# Patient Record
Sex: Female | Born: 1970 | Race: White | Hispanic: No | Smoking: Never smoker
Health system: Southern US, Community
[De-identification: ages and names within clinical notes are randomized; demographics above are authoritative.]

## PROBLEM LIST (undated history)

## (undated) DIAGNOSIS — R42 Dizziness and giddiness: Secondary | ICD-10-CM

## (undated) DIAGNOSIS — C801 Malignant (primary) neoplasm, unspecified: Secondary | ICD-10-CM

## (undated) DIAGNOSIS — Z9889 Other specified postprocedural states: Secondary | ICD-10-CM

## (undated) DIAGNOSIS — R000 Tachycardia, unspecified: Secondary | ICD-10-CM

## (undated) DIAGNOSIS — Z800 Family history of malignant neoplasm of digestive organs: Secondary | ICD-10-CM

## (undated) DIAGNOSIS — I10 Essential (primary) hypertension: Secondary | ICD-10-CM

## (undated) DIAGNOSIS — C189 Malignant neoplasm of colon, unspecified: Secondary | ICD-10-CM

## (undated) DIAGNOSIS — E669 Obesity, unspecified: Secondary | ICD-10-CM

## (undated) DIAGNOSIS — F419 Anxiety disorder, unspecified: Secondary | ICD-10-CM

## (undated) DIAGNOSIS — T8859XA Other complications of anesthesia, initial encounter: Secondary | ICD-10-CM

## (undated) DIAGNOSIS — Z803 Family history of malignant neoplasm of breast: Secondary | ICD-10-CM

## (undated) DIAGNOSIS — F32A Depression, unspecified: Secondary | ICD-10-CM

## (undated) DIAGNOSIS — T4145XA Adverse effect of unspecified anesthetic, initial encounter: Secondary | ICD-10-CM

## (undated) DIAGNOSIS — E785 Hyperlipidemia, unspecified: Secondary | ICD-10-CM

## (undated) DIAGNOSIS — F329 Major depressive disorder, single episode, unspecified: Secondary | ICD-10-CM

## (undated) DIAGNOSIS — R112 Nausea with vomiting, unspecified: Secondary | ICD-10-CM

## (undated) DIAGNOSIS — E039 Hypothyroidism, unspecified: Secondary | ICD-10-CM

## (undated) HISTORY — DX: Tachycardia, unspecified: R00.0

## (undated) HISTORY — DX: Malignant neoplasm of colon, unspecified: C18.9

## (undated) HISTORY — DX: Obesity, unspecified: E66.9

## (undated) HISTORY — DX: Family history of malignant neoplasm of digestive organs: Z80.0

## (undated) HISTORY — DX: Hypothyroidism, unspecified: E03.9

## (undated) HISTORY — DX: Anxiety disorder, unspecified: F41.9

## (undated) HISTORY — DX: Malignant (primary) neoplasm, unspecified: C80.1

## (undated) HISTORY — DX: Family history of malignant neoplasm of breast: Z80.3

## (undated) HISTORY — DX: Dizziness and giddiness: R42

## (undated) HISTORY — DX: Essential (primary) hypertension: I10

## (undated) HISTORY — DX: Depression, unspecified: F32.A

## (undated) HISTORY — PX: WISDOM TOOTH EXTRACTION: SHX21

## (undated) HISTORY — DX: Major depressive disorder, single episode, unspecified: F32.9

## (undated) HISTORY — DX: Hyperlipidemia, unspecified: E78.5

---

## 1998-04-03 ENCOUNTER — Emergency Department (HOSPITAL_COMMUNITY): Admission: EM | Admit: 1998-04-03 | Discharge: 1998-04-03 | Payer: Self-pay | Admitting: Emergency Medicine

## 1998-04-03 ENCOUNTER — Encounter: Payer: Self-pay | Admitting: Emergency Medicine

## 1999-11-01 ENCOUNTER — Other Ambulatory Visit: Admission: RE | Admit: 1999-11-01 | Discharge: 1999-11-01 | Payer: Self-pay | Admitting: Gynecology

## 2005-01-02 ENCOUNTER — Emergency Department: Payer: Self-pay | Admitting: Emergency Medicine

## 2005-01-03 ENCOUNTER — Ambulatory Visit: Payer: Self-pay | Admitting: Internal Medicine

## 2005-06-03 ENCOUNTER — Ambulatory Visit: Payer: Self-pay | Admitting: Obstetrics and Gynecology

## 2005-07-26 ENCOUNTER — Inpatient Hospital Stay: Payer: Self-pay

## 2010-08-05 ENCOUNTER — Encounter: Payer: Self-pay | Admitting: Nurse Practitioner

## 2010-08-05 ENCOUNTER — Telehealth: Payer: Self-pay | Admitting: *Deleted

## 2010-08-05 DIAGNOSIS — E669 Obesity, unspecified: Secondary | ICD-10-CM | POA: Insufficient documentation

## 2010-08-05 DIAGNOSIS — R000 Tachycardia, unspecified: Secondary | ICD-10-CM | POA: Insufficient documentation

## 2010-08-05 DIAGNOSIS — R079 Chest pain, unspecified: Secondary | ICD-10-CM | POA: Insufficient documentation

## 2010-08-05 DIAGNOSIS — R42 Dizziness and giddiness: Secondary | ICD-10-CM | POA: Insufficient documentation

## 2010-08-05 NOTE — Telephone Encounter (Signed)
Pt called, c/o chest pressure and tightness since last night.  Pt admits to being under a lot of stress and is tearful.  No chest pain, pressure, or tightness now.  Pt would like to be seen by someone.  Norma Fredrickson NP notified and instructed RN to have pt come in today for office pt.  Pt notified of Lori's instructions and is unable to come in today.  RN set pt up to see Lawson Fiscal on 08/06/10 at 845 am.  Pt was advised to go to ER if symptoms return.   Pt verbalized to RN understanding of instructions.

## 2010-08-06 ENCOUNTER — Encounter: Payer: Self-pay | Admitting: Nurse Practitioner

## 2010-08-06 ENCOUNTER — Ambulatory Visit (INDEPENDENT_AMBULATORY_CARE_PROVIDER_SITE_OTHER): Payer: BC Managed Care – PPO | Admitting: Nurse Practitioner

## 2010-08-06 DIAGNOSIS — R079 Chest pain, unspecified: Secondary | ICD-10-CM

## 2010-08-06 DIAGNOSIS — E785 Hyperlipidemia, unspecified: Secondary | ICD-10-CM

## 2010-08-06 LAB — HEPATIC FUNCTION PANEL
ALT: 22 U/L (ref 0–35)
AST: 21 U/L (ref 0–37)
Albumin: 3.7 g/dL (ref 3.5–5.2)
Alkaline Phosphatase: 95 U/L (ref 39–117)
Bilirubin, Direct: 0 mg/dL (ref 0.0–0.3)
Total Bilirubin: 0.6 mg/dL (ref 0.3–1.2)
Total Protein: 7 g/dL (ref 6.0–8.3)

## 2010-08-06 LAB — BASIC METABOLIC PANEL
BUN: 12 mg/dL (ref 6–23)
CO2: 26 mEq/L (ref 19–32)
Calcium: 8.2 mg/dL — ABNORMAL LOW (ref 8.4–10.5)
Chloride: 104 mEq/L (ref 96–112)
Creatinine, Ser: 0.7 mg/dL (ref 0.4–1.2)
GFR: 98.02 mL/min (ref >60.00)
Glucose, Bld: 120 mg/dL — ABNORMAL HIGH (ref 70–99)
Potassium: 4.2 mEq/L (ref 3.5–5.1)
Sodium: 141 mEq/L (ref 135–145)

## 2010-08-06 LAB — LIPID PANEL
Cholesterol: 240 mg/dL — ABNORMAL HIGH (ref 0–200)
HDL: 56.1 mg/dL (ref >39.00)
Total CHOL/HDL Ratio: 4
Triglycerides: 295 mg/dL — ABNORMAL HIGH (ref 0.0–149.0)
VLDL: 59 mg/dL — ABNORMAL HIGH (ref 0.0–40.0)

## 2010-08-06 LAB — LDL CHOLESTEROL, DIRECT: Direct LDL: 140.5 mg/dL

## 2010-08-06 LAB — TSH: TSH: 0.91 u[IU]/mL (ref 0.35–5.50)

## 2010-08-06 MED ORDER — ASPIRIN EC 81 MG PO TBEC: 81.0000 mg | DELAYED_RELEASE_TABLET | Freq: Every day | ORAL | Status: AC

## 2010-08-06 NOTE — Progress Notes (Signed)
    Beverly Schwartz Date of Birth: 23-Dec-1970   History of Present Illness: Beverly Schwartz is seen today for a work in visit. She is seen for Dr. Deborah Chalk. She had an episode of chest pressure this past Sunday night. It was midsternal in location. She feels like it radiated to her neck. She was not doing anything exertional. The discomfort lasted about 5 minutes. She was not sweaty or clammy. She did feel like it was hard to breath and that lasted for about 30 minutes altogether. She took some aspirin.  She had some recurrence yesterday and noted that it hurt to take a deep breath. She has not had a recent cough or cold. No fever or chills. She has not been here since 2010. She did not take the cholesterol medicine. She is now diabetic but is controlling it with diet. She has lost about 20 pounds since her last visit here.   Current Outpatient Prescriptions on File Prior to Visit  Medication Sig Dispense Refill  . levothyroxine (SYNTHROID, LEVOTHROID) 125 MCG tablet Take 125 mcg by mouth daily.          No Known Allergies  Past Medical History  Diagnosis Date  . Tachycardia   . Chest pain   . Dizziness   . Obesity   . Diabetes mellitus     Past Surgical History  Procedure Date  . Wisdom tooth extraction     age 51's    History  Smoking status  . Never Smoker   Smokeless tobacco  . Not on file    History  Alcohol Use No    Family History  Problem Relation Age of Onset  . Heart disease Father   . Heart disease Paternal Uncle     Review of Systems: The review of systems is positive for anxiety. She is prone to panic attacks. She is actively losing weight. Blood sugars are normalizing.  All other systems were reviewed and are negative.  Physical Exam: BP 138/88  Pulse 88  Ht 5\' 4"  (1.626 m)  Wt 194 lb (87.998 kg)  BMI 33.30 kg/m2 Patient is pleasant and in no acute distress. She is somewhat tearful during the exam. Skin is warm and dry. Color is normal.  HEENT is unremarkable.  Normocephalic/atraumatic. PERRL. Sclera are nonicteric. Neck is supple. No masses. No JVD. Lungs are clear. Cardiac exam shows a regular rate and rhythm. Abdomen is soft. Extremities are without edema. Gait and ROM are intact. No gross neurologic deficits noted.  LABORATORY DATA:  EKG is normal.    Assessment / Plan:

## 2010-08-06 NOTE — Assessment & Plan Note (Signed)
Fasting lipds are checked today. I have encouraged her to start lipid lowering if indicated.

## 2010-08-06 NOTE — Patient Instructions (Signed)
We will check some labs today. We are going to arrange for a stress test. Take a baby aspirin each day. We are going to check a CXR today as well.

## 2010-08-06 NOTE — Assessment & Plan Note (Signed)
She has multiple cardiovascular risk factors (weight, strong family history, dyslipidemia and now she is diabetic). We will arrange for a CXR today and a stress echo. Labs are checked today. Further disposition to follow once her studies are complete. She will continue with aspirin therapy. Patient is agreeable to this plan and will call if any problems develop in the interim. '

## 2010-08-07 ENCOUNTER — Telehealth: Payer: Self-pay | Admitting: *Deleted

## 2010-08-07 NOTE — Telephone Encounter (Signed)
Message copied by Barnetta Hammersmith on Wed Aug 07, 2010  8:26 AM ------      Message from: Norma Fredrickson      Created: Wed Aug 07, 2010  7:57 AM       Would strongly suggest cholesterol medicines. Would she try Crestor 10 mg per day. Recheck labs in 6 weeks.

## 2010-08-09 NOTE — Telephone Encounter (Signed)
Pt notified of lab results and was instructed to start Crestor 10 mg daily.  Pt will come by next week for samples of Crestor 10 mg and will pick up a copy of her labs at that time.  Pt told to f/u with PCP in six weeks for a recheck of labs to include liver functions and lipid panel.  Pt verbalized to RN understanding of instructions.

## 2010-08-14 ENCOUNTER — Other Ambulatory Visit (HOSPITAL_COMMUNITY): Payer: Self-pay

## 2010-08-16 ENCOUNTER — Ambulatory Visit (HOSPITAL_COMMUNITY): Payer: BC Managed Care – PPO | Attending: Cardiology | Admitting: Radiology

## 2010-08-16 DIAGNOSIS — R079 Chest pain, unspecified: Secondary | ICD-10-CM | POA: Insufficient documentation

## 2010-08-16 DIAGNOSIS — R072 Precordial pain: Secondary | ICD-10-CM

## 2010-08-23 ENCOUNTER — Other Ambulatory Visit: Payer: Self-pay | Admitting: *Deleted

## 2010-08-23 ENCOUNTER — Telehealth: Payer: Self-pay | Admitting: *Deleted

## 2010-08-23 ENCOUNTER — Telehealth: Payer: Self-pay | Admitting: Cardiology

## 2010-08-23 MED ORDER — LEVOTHYROXINE SODIUM 125 MCG PO TABS: 125.0000 ug | ORAL_TABLET | Freq: Every day | ORAL | Status: DC

## 2010-08-23 NOTE — Telephone Encounter (Signed)
Pt wants to know stress test results she had done last Friday and also she would like refill of thyroid meds

## 2010-08-23 NOTE — Telephone Encounter (Signed)
Pt notified of stress echo results.  Pt encourage to start statin therapy and to have excellent glucose control.  Pt will call back if she decides to start cholesterol medication.

## 2010-08-23 NOTE — Telephone Encounter (Signed)
Message copied by Adolphus Birchwood on Fri Aug 23, 2010  3:08 PM ------      Message from: Rosalio Macadamia      Created: Fri Aug 23, 2010  8:21 AM       Stress echo looks good. No ischemia. Need to continue with cardiovascular risk factor modification. Did she start statin therapy?      Needs excellent glucose control.

## 2010-08-23 NOTE — Telephone Encounter (Signed)
Pt notified of stress echo results.  Pt was strongly encouraged to start statin therapy and to have excellent glucose control.  Pt will call back if she decides to start statin therapy.

## 2010-09-03 ENCOUNTER — Telehealth: Payer: Self-pay | Admitting: *Deleted

## 2010-09-03 NOTE — Telephone Encounter (Signed)
error 

## 2011-12-15 ENCOUNTER — Other Ambulatory Visit: Payer: Self-pay | Admitting: Obstetrics and Gynecology

## 2011-12-15 DIAGNOSIS — Z1231 Encounter for screening mammogram for malignant neoplasm of breast: Secondary | ICD-10-CM

## 2012-01-13 ENCOUNTER — Ambulatory Visit
Admission: RE | Admit: 2012-01-13 | Discharge: 2012-01-13 | Disposition: A | Discharge status: Home / Self Care | Payer: BC Managed Care – PPO | Source: Ambulatory Visit | Attending: Obstetrics and Gynecology | Admitting: Obstetrics and Gynecology

## 2012-01-13 DIAGNOSIS — Z1231 Encounter for screening mammogram for malignant neoplasm of breast: Secondary | ICD-10-CM

## 2013-02-28 ENCOUNTER — Encounter (HOSPITAL_COMMUNITY): Payer: Self-pay | Admitting: Emergency Medicine

## 2013-02-28 ENCOUNTER — Emergency Department (HOSPITAL_COMMUNITY)
Admission: EM | Admit: 2013-02-28 | Discharge: 2013-02-28 | Disposition: A | Discharge status: Home / Self Care | Payer: BC Managed Care – PPO | Attending: Emergency Medicine | Admitting: Emergency Medicine

## 2013-02-28 DIAGNOSIS — Z8669 Personal history of other diseases of the nervous system and sense organs: Secondary | ICD-10-CM | POA: Insufficient documentation

## 2013-02-28 DIAGNOSIS — Z885 Allergy status to narcotic agent status: Secondary | ICD-10-CM | POA: Insufficient documentation

## 2013-02-28 DIAGNOSIS — X500XXA Overexertion from strenuous movement or load, initial encounter: Secondary | ICD-10-CM | POA: Insufficient documentation

## 2013-02-28 DIAGNOSIS — M545 Low back pain, unspecified: Secondary | ICD-10-CM

## 2013-02-28 DIAGNOSIS — S335XXA Sprain of ligaments of lumbar spine, initial encounter: Secondary | ICD-10-CM | POA: Insufficient documentation

## 2013-02-28 DIAGNOSIS — Z8679 Personal history of other diseases of the circulatory system: Secondary | ICD-10-CM | POA: Insufficient documentation

## 2013-02-28 DIAGNOSIS — Z79899 Other long term (current) drug therapy: Secondary | ICD-10-CM | POA: Insufficient documentation

## 2013-02-28 DIAGNOSIS — Z881 Allergy status to other antibiotic agents status: Secondary | ICD-10-CM | POA: Insufficient documentation

## 2013-02-28 DIAGNOSIS — T148XXA Other injury of unspecified body region, initial encounter: Secondary | ICD-10-CM

## 2013-02-28 DIAGNOSIS — E669 Obesity, unspecified: Secondary | ICD-10-CM | POA: Insufficient documentation

## 2013-02-28 DIAGNOSIS — Y929 Unspecified place or not applicable: Secondary | ICD-10-CM | POA: Insufficient documentation

## 2013-02-28 DIAGNOSIS — M62838 Other muscle spasm: Secondary | ICD-10-CM | POA: Insufficient documentation

## 2013-02-28 DIAGNOSIS — Y9375 Activity, martial arts: Secondary | ICD-10-CM | POA: Insufficient documentation

## 2013-02-28 DIAGNOSIS — E119 Type 2 diabetes mellitus without complications: Secondary | ICD-10-CM | POA: Insufficient documentation

## 2013-02-28 MED ORDER — NAPROXEN 500 MG PO TABS: 500.0000 mg | ORAL_TABLET | Freq: Two times a day (BID) | ORAL | Status: DC

## 2013-02-28 MED ORDER — METHOCARBAMOL 500 MG PO TABS: 500.0000 mg | ORAL_TABLET | Freq: Two times a day (BID) | ORAL | Status: DC

## 2013-02-28 NOTE — ED Provider Notes (Signed)
CSN: [id number]     Arrival date & time 02/28/13  1016 History   First MD Initiated Contact with Patient 02/28/13 1043    This chart was scribed for Beverly Helper PA-C, a non-physician practitioner working with Beverly Gales, MD by Beverly Schwartz, ED Scribe. This patient was seen in room TR09C/TR09C and the patient's care was started at 11:40 AM     Chief Complaint  Patient presents with  . Back Pain   (Consider location/radiation/quality/duration/timing/severity/associated sxs/prior Treatment) The history is provided by the patient. No language interpreter was used.   HPI Comments: Beverly Schwartz is a 42 y.o. female who presents to the Emergency Department complaining of constant moderate left sided low back pain onset 3 weeks. Describes pain as tight, spasms, and without radiation. Reports recent "yellow belt demonstration" in martial arts class with low back pain starting following day. States she tried to push opponent off, but he laid on her left leg. Reports pain is exacerbated by flexion. Reports trying ibuprofen with moderate relief of symptoms. Denies associated other known injury, fall, fever, dysuria, hematuria, and numbness. Denies urinary or fecal incontinence, urinary retention, perineal/saddle paresthesias, fever, PMHx of cancer, and IV drug use.  Past Medical History  Diagnosis Date  . Tachycardia   . Chest pain   . Dizziness   . Obesity   . Diabetes mellitus    Past Surgical History  Procedure Laterality Date  . Wisdom tooth extraction      age 79's   Family History  Problem Relation Age of Onset  . Heart disease Father   . Heart disease Paternal Uncle    History  Substance Use Topics  . Smoking status: Never Smoker   . Smokeless tobacco: Not on file  . Alcohol Use: No   OB History   Grav Para Term Preterm Abortions TAB SAB Ect Mult Living                 Review of Systems  Constitutional: Negative for fever.  Musculoskeletal: Positive for back pain.   Skin: Negative for wound.  Neurological: Negative for numbness.    Allergies  Vicodin and Amoxicillin  Home Medications   Current Outpatient Rx  Name  Route  Sig  Dispense  Refill  . levothyroxine (SYNTHROID, LEVOTHROID) 125 MCG tablet   Oral   Take 1 tablet (125 mcg total) by mouth daily.   90 tablet   2    BP 173/96  Pulse 105  Temp(Src) 98.9 F (37.2 C) (Oral)  Resp 18  SpO2 99% Physical Exam  Nursing note and vitals reviewed. Constitutional: She is oriented to person, place, and time. She appears well-developed and well-nourished. No distress.  HENT:  Head: Normocephalic and atraumatic.  Eyes: Conjunctivae and EOM are normal.  Neck: Neck supple. No tracheal deviation present.  Cardiovascular: Normal rate and regular rhythm.   No murmur heard. Pulmonary/Chest: Effort normal and breath sounds normal. No respiratory distress.  Musculoskeletal: Normal range of motion. She exhibits tenderness. She exhibits no edema.  No midline C-spine, T-spine, or L-spine tenderness with no step-offs, crepitus, or deformities noted   TTP of left paraspinal lumbar region. No obvious sign changes noted.      Neurological: She is alert and oriented to person, place, and time.  5/5 strength in bilateral lower extremities. Ankle plantar and dorsiflexion intact. Great toe extension intact bilaterally. +2 DP and PT pulses. +2 patellar reflexes bilaterally. Normal gait.   Skin: Skin is warm and dry.  Psychiatric:  She has a normal mood and affect. Her behavior is normal.    ED Course  Procedures  COORDINATION OF CARE:  Nursing notes reviewed. Vital signs reviewed. Initial pt interview and examination performed.   11:45 AM-Pt informed of return precautions and is comfortable with discharge at this time.    Treatment plan initiated:Medications - No data to display   Initial diagnostic testing ordered.    Labs Review Labs Reviewed - No data to display Imaging Review No results  found.  EKG Interpretation   None       MDM   1. Muscle strain   2. Low back pain    BP 173/96  Pulse 105  Temp(Src) 98.9 F (37.2 C) (Oral)  Resp 18  SpO2 99%   I personally performed the services described in this documentation, which was scribed in my presence. The recorded information has been reviewed and is accurate.      Beverly Helper, PA-C 02/28/13 1211

## 2013-02-28 NOTE — ED Notes (Signed)
C/o lower back pain onset 2-3 weeks ago. States pain isn't any different today just isn't getting better. Describes as tight and crampy feeling. No history of injury.

## 2013-03-02 NOTE — ED Provider Notes (Signed)
Medical screening examination/treatment/procedure(s) were performed by non-physician practitioner and as supervising physician I was immediately available for consultation/collaboration.  EKG Interpretation   None         Darlys Gales, MD 03/02/13 [phone]

## 2013-03-30 ENCOUNTER — Encounter (HOSPITAL_COMMUNITY): Payer: Self-pay | Admitting: Emergency Medicine

## 2013-03-30 ENCOUNTER — Emergency Department (HOSPITAL_COMMUNITY): Payer: Self-pay

## 2013-03-30 ENCOUNTER — Emergency Department (HOSPITAL_COMMUNITY)
Admission: EM | Admit: 2013-03-30 | Discharge: 2013-03-30 | Disposition: A | Discharge status: Home / Self Care | Payer: BC Managed Care – PPO | Source: Home / Self Care | Attending: Emergency Medicine | Admitting: Emergency Medicine

## 2013-03-30 DIAGNOSIS — H6122 Impacted cerumen, left ear: Secondary | ICD-10-CM

## 2013-03-30 DIAGNOSIS — M545 Low back pain, unspecified: Secondary | ICD-10-CM

## 2013-03-30 DIAGNOSIS — H6120 Impacted cerumen, unspecified ear: Secondary | ICD-10-CM

## 2013-03-30 DIAGNOSIS — H60322 Hemorrhagic otitis externa, left ear: Secondary | ICD-10-CM

## 2013-03-30 MED ORDER — CEPHALEXIN 500 MG PO CAPS: 500.0000 mg | ORAL_CAPSULE | Freq: Three times a day (TID) | ORAL | Status: DC

## 2013-03-30 MED ORDER — NEOMYCIN-POLYMYXIN-HC 3.5-10000-1 OT SUSP: 3.0000 [drp] | Freq: Four times a day (QID) | OTIC | Status: DC

## 2013-03-30 MED ORDER — MELOXICAM 15 MG PO TABS: 15.0000 mg | ORAL_TABLET | Freq: Every day | ORAL | Status: DC

## 2013-03-30 MED ORDER — TRAMADOL HCL 50 MG PO TABS: 100.0000 mg | ORAL_TABLET | Freq: Three times a day (TID) | ORAL | Status: DC | PRN

## 2013-03-30 MED ORDER — METHOCARBAMOL 500 MG PO TABS: 500.0000 mg | ORAL_TABLET | Freq: Three times a day (TID) | ORAL | Status: DC

## 2013-03-30 NOTE — ED Notes (Signed)
Patient refusing lumbar spine x-ray, thought there was no charge for x-rays. Stated that she would get the x-rays when she goes to her "back" doctor

## 2013-03-30 NOTE — ED Notes (Signed)
Could not take patient for lumbar spine x-rays, patient was having ear wax removed

## 2013-03-30 NOTE — Discharge Instructions (Signed)
Do exercises twice daily followed by moist heat for 15 minutes. ° ° ° ° ° °Try to be as active as possible. ° °If no better in 2 weeks, follow up with orthopedist. ° ° °

## 2013-03-30 NOTE — ED Notes (Signed)
C/o of left ear pain, no drainage, feels stopped up, unable to hear. Lower back pain that was diagnosed as a strain by the hospital a few weeks ago. Stated she has taken muscle relaxer's with no relief. Written by: Lenore Manner, SMA

## 2013-03-30 NOTE — ED Provider Notes (Signed)
Chief Complaint:   Chief Complaint  Patient presents with  . Otalgia  . Back Pain    History of Present Illness:   Beverly Schwartz is a 43 year old female who presents today for back pain and left ear pain.  1. Lower back pain: The patient is a 2 month history of lower back pain. This followed a Ta Kwan Do test in which she had to flip someone over her shoulder. Ever since then she's had pain in the left mid lumbar area which radiates into the left hip. The pain is rated a 9/10 in intensity. It's worse when she bends, gets out of a chair, gets out of her bed, gets in and out of the car. Nothing seems to help the pain. There is no radiation the pain down the legs, numbness, tingling, weakness, bladder or bowel dysfunction, or saddle anesthesia. She went to the emergency room at onset of symptoms and was told she had a back strain. She was given a muscle relaxer, but the pain has continued.  2. Left ear pain: The patient has a one-week history of left ear congestion. She tried to clean it out on her own and the ear ended up getting irritated and painful.  Review of Systems:  Other than noted above, the patient denies any of the following symptoms: Systemic:  No fever, chills, severe fatigue, or unexplained weight loss. GI:  No abdominal pain, nausea, vomiting, diarrhea, constipation, incontinence of bowel, or blood in stool. GU:  No dysuria, frequency, urgency, or hematuria. No incontinence of urine or difficulty urinating.  M-S:  No neck pain, joint pain, arthritis, or myalgias. Neuro:  No paresthesias, saddle anesthesia, muscular weakness, or progressive neurological deficit.  [place]:  Past medical history, family history, social history, meds, and allergies were reviewed. Specifically, there is no history of cancer, major trauma, osteoporosis, immunosuppression, or HIV infection. She is allergic to amoxicillin. She takes Synthroid for low thyroid. She also has diet-controlled type 2 diabetes.  She's followed by Dr. Delrae Rend.  Physical Exam:   Vital signs:  BP 161/93  Pulse 94  Temp(Src) 98.5 F (36.9 C) (Oral)  Resp 18 General:  Alert, oriented, in no distress. ENT: There is a cerumen impaction in the left ear canal. The canal appears red and inflamed. There is a small pustule at the 1:00 position in the distal ear canal. Abdomen:  Soft, non-tender.  No organomegaly or mass.  No pulsatile midline abdominal mass or bruit. Back:  She has mild tenderness to palpation in the paravertebral muscles in the mid lumbar spine. The back has a limited range of motion with 45 of flexion, 15 of extension, 15 of lateral bending, and 45 of rotation with pain. Straight leg raising produces pain in the lower back but no radiating pain. Neuro:  Normal muscle strength, sensations and DTRs. Extremities: Pedal pulses were full, there was no edema. Skin:  Clear, warm and dry.  No rash.   Radiology:  She declines a lumbar spine film.  Course in Urgent Care Center:   The ear was irrigated with warm water and the impaction was resolved. Ear canal still appears inflamed, swollen, erythematous, with pustule as described above. An ear wick was inserted.  Assessment:  The primary encounter diagnosis was Impacted cerumen of left ear. Diagnoses of Otitis externa hemorrhagica, left and Lumbago were also pertinent to this visit.  She will need followup with orthopedics.  Plan:   1.  Meds:  The following meds were prescribed:  New Prescriptions   CEPHALEXIN (KEFLEX) 500 MG CAPSULE    Take 1 capsule (500 mg total) by mouth 3 (three) times daily.   MELOXICAM (MOBIC) 15 MG TABLET    Take 1 tablet (15 mg total) by mouth daily.   METHOCARBAMOL (ROBAXIN) 500 MG TABLET    Take 1 tablet (500 mg total) by mouth 3 (three) times daily.   NEOMYCIN-POLYMYXIN-HYDROCORTISONE (CORTISPORIN) 3.5-10000-1 OTIC SUSPENSION    Place 3 drops into the left ear 4 (four) times daily.   TRAMADOL (ULTRAM) 50 MG TABLET    Take 2  tablets (100 mg total) by mouth every 8 (eight) hours as needed.    2.  Patient Education/Counseling:  The patient was given appropriate handouts, self care instructions, and instructed in symptomatic relief. The patient was encouraged to try to be as active as possible and given some exercises to do followed by moist heat. Advised no water in the ear for the next week. If the ear wick has not fallen out on its own she is to return after week to have her removed. She was instructed to do the exercises twice daily.  3.  Follow up:  The patient was told to follow up if no better in 3 to 4 days, if becoming worse in any way, and given some red flag symptoms such as worsening ear pain, worsening back pain, or new neurological symptoms which would prompt immediate return.  Follow up with Dr. Melrose Nakayama in one week.     Harden Mo, MD 03/30/13 [phone]

## 2013-04-01 ENCOUNTER — Emergency Department (INDEPENDENT_AMBULATORY_CARE_PROVIDER_SITE_OTHER)
Admission: EM | Admit: 2013-04-01 | Discharge: 2013-04-01 | Disposition: A | Discharge status: Home / Self Care | Payer: BC Managed Care – PPO | Source: Home / Self Care | Attending: Family Medicine | Admitting: Family Medicine

## 2013-04-01 ENCOUNTER — Encounter (HOSPITAL_COMMUNITY): Payer: Self-pay | Admitting: Emergency Medicine

## 2013-04-01 DIAGNOSIS — T162XXD Foreign body in left ear, subsequent encounter: Secondary | ICD-10-CM

## 2013-04-01 DIAGNOSIS — R51 Headache: Secondary | ICD-10-CM

## 2013-04-01 DIAGNOSIS — H9209 Otalgia, unspecified ear: Secondary | ICD-10-CM

## 2013-04-01 NOTE — ED Notes (Signed)
Pt here for follow up on left ear pain and to have wick removed.  Pt states that the ear wick has went deeper into ear.  Denies any other problems.

## 2013-04-01 NOTE — ED Provider Notes (Signed)
CSN: [id number]     Arrival date & time 04/01/13  0820 History   First MD Initiated Contact with Patient 04/01/13 [phone]     Chief Complaint  Patient presents with  . Otalgia  . Follow-up   (Consider location/radiation/quality/duration/timing/severity/associated sxs/prior Treatment) Patient is a 43 y.o. female presenting with ear pain. The history is provided by the patient.  Otalgia Location:  Left Behind ear:  No abnormality Quality:  Sore Severity:  Mild Progression:  Worsening Context comment:  Had ear wick placed on 1/7, feels it has gone deeper and has caused headaches. Associated symptoms: headaches   Associated symptoms: no ear discharge     Past Medical History  Diagnosis Date  . Tachycardia   . Chest pain   . Dizziness   . Obesity   . Diabetes mellitus    Past Surgical History  Procedure Laterality Date  . Wisdom tooth extraction      age 24's   Family History  Problem Relation Age of Onset  . Heart disease Father   . Heart disease Paternal Uncle    History  Substance Use Topics  . Smoking status: Never Smoker   . Smokeless tobacco: Not on file  . Alcohol Use: No   OB History   Grav Para Term Preterm Abortions TAB SAB Ect Mult Living                 Review of Systems  Constitutional: Negative.   HENT: Positive for ear pain. Negative for ear discharge.   Neurological: Positive for headaches.    Allergies  Amoxicillin  Home Medications   Current Outpatient Rx  Name  Route  Sig  Dispense  Refill  . levothyroxine (SYNTHROID, LEVOTHROID) 125 MCG tablet   Oral   Take 1 tablet (125 mcg total) by mouth daily.   90 tablet   2   . cephALEXin (KEFLEX) 500 MG capsule   Oral   Take 1 capsule (500 mg total) by mouth 3 (three) times daily.   30 capsule   0   . meloxicam (MOBIC) 15 MG tablet   Oral   Take 1 tablet (15 mg total) by mouth daily.   15 tablet   0   . methocarbamol (ROBAXIN) 500 MG tablet   Oral   Take 1 tablet (500 mg total) by  mouth 2 (two) times daily.   20 tablet   0   . methocarbamol (ROBAXIN) 500 MG tablet   Oral   Take 1 tablet (500 mg total) by mouth 3 (three) times daily.   30 tablet   0   . naproxen (NAPROSYN) 500 MG tablet   Oral   Take 1 tablet (500 mg total) by mouth 2 (two) times daily.   30 tablet   0   . neomycin-polymyxin-hydrocortisone (CORTISPORIN) 3.5-10000-1 otic suspension   Left Ear   Place 3 drops into the left ear 4 (four) times daily.   10 mL   0   . traMADol (ULTRAM) 50 MG tablet   Oral   Take 2 tablets (100 mg total) by mouth every 8 (eight) hours as needed.   30 tablet   0    BP 171/110  Pulse 106  Temp(Src) 98.3 F (36.8 C) (Oral)  Resp 18  SpO2 100% Physical Exam  Nursing note and vitals reviewed. Constitutional: She is oriented to person, place, and time. She appears well-developed and well-nourished. She appears distressed.  HENT:  Head: Normocephalic.  Right Ear: External ear  normal.  Mouth/Throat: Oropharynx is clear and moist.  Ear wick on left removed and sx resolved, canal improved.  Neurological: She is alert and oriented to person, place, and time.  Skin: Skin is warm and dry.    ED Course  Procedures (including critical care time) Labs Review Labs Reviewed - No data to display Imaging Review No results found.  EKG Interpretation    Date/Time:    Ventricular Rate:    PR Interval:    QRS Duration:   QT Interval:    QTC Calculation:   R Axis:     Text Interpretation:              MDM  Ear wick removed.    Billy Fischer, MD 04/01/13 [phone]

## 2013-04-01 NOTE — Discharge Instructions (Signed)
Finish ear drops , return as needed.

## 2013-05-11 ENCOUNTER — Ambulatory Visit: Payer: Self-pay | Admitting: *Deleted

## 2013-05-25 ENCOUNTER — Encounter: Payer: BC Managed Care – PPO | Attending: Internal Medicine | Admitting: *Deleted

## 2013-05-25 ENCOUNTER — Encounter: Payer: Self-pay | Admitting: *Deleted

## 2013-05-25 VITALS — Ht 65.0 in | Wt 195.1 lb

## 2013-05-25 DIAGNOSIS — Z713 Dietary counseling and surveillance: Secondary | ICD-10-CM | POA: Insufficient documentation

## 2013-05-25 DIAGNOSIS — E119 Type 2 diabetes mellitus without complications: Secondary | ICD-10-CM | POA: Insufficient documentation

## 2013-05-25 NOTE — Progress Notes (Signed)
Appt start time: 1030 end time:  1200.  Assessment:  Patient was seen on  05/25/13 for individual diabetes education. Patient states history of diabetes for the past 4 years. She has just started on Metformin. Lives with 41 year old son, she shops and cooks their food. 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 She tries to SMBG once a day but gets discouraged with high numbers. Enjoys going to [employer] or the mountains and enjoys camping in a camper. She also enjoys [employer] Do and does it 45 minutes twice a week. She also uses elyptical and treadmill at home and she exercises a total of 5 days a week.  Current HbA1c: not provided by MD and patient cannot remember, but she thinks it was in the 8 range.  Preferred Learning Style:   Visual  Hands on  Learning Readiness:   Ready  Change in progress  MEDICATIONS: see list. Diabetes medication is Metformin  DIETARY INTAKE:  24-hr recall:  B ( AM): skips 3 days a week OR Protein Bar OR eggs, [place] bacon, English Muffin with soft oleo. Occasionally coffee with cream and Splenda  Snk ( AM): infrequently baked chips  L ( PM): sandwich and baked chips or pretzels or fruit OR large salad with Ranch, water or diet soda Snk ( PM): not usually D ( PM): meat or grilled fish usually, starch, ([place] pasta), vegetables, water or diet soda Snk ( PM): 1-2 mini chocolate pieces infrequently Beverages: water or diet soda  Usual physical activity: Tai Kwon Do 45 minutes twice a week, uses elyptical and treadmill or WII at home and she exercises 5 days a week.  Estimated energy needs: 1400 calories 158 g carbohydrates 105 g protein 39 g fat  Intervention:  Nutrition counseling provided.  Discussed diabetes disease process and treatment options.  Discussed physiology of diabetes and role of obesity on insulin resistance.  Encouraged moderate weight reduction to improve glucose levels.  Discussed role  of medications and diet in glucose control  Provided education on macronutrients on glucose levels.  Provided education on carb counting, importance of regularly scheduled meals/snacks, and meal planning  Discussed effects of physical activity on glucose levels and long-term glucose control.  Recommended 150 minutes of physical activity/week.  Reviewed patient medications.  Discussed role of medication on blood glucose and possible side effects  Discussed blood glucose monitoring and interpretation.  Discussed recommended target ranges and individual ranges.    Described short-term complications: hyper- and hypo-glycemia.  Discussed causes,symptoms, and treatment options.  Discussed prevention, detection, and treatment of long-term complications.  Discussed the role of prolonged elevated glucose levels on body systems.  Discussed role of stress on blood glucose levels and discussed strategies to manage psychosocial issues.  Discussed recommendations for long-term diabetes self-care.  Established checklist for medical, dental, and emotional self-care.  Plan:  Aim for 2-3 Carb Choices per meal (30-45 grams)   Aim for 0-1 Carbs per snack if hungry  Try to include moderate serving of protein with your meals and snacks. Consider reading food labels for Total Carbohydrate of foods Consider using Food APPS for easy access to Nutrition information Continue with your activity level daily as tolerated Consider checking BG at alternate times per day as directed by MD  Teaching Method Utilized: Visual, Auditory and Hands on  Handouts given during visit include: Living Well with Diabetes Carb Counting and Food Label handouts Meal Plan Card  Barriers to learning/adherence to lifestyle  change: none at this time  Diabetes self-care support plan:   [facility] support group flyer provided  Demonstrated degree of understanding via:  Teach Back   Monitoring/Evaluation:  Dietary intake, exercise,  reading food labels, and body weight prn.

## 2013-05-25 NOTE — Patient Instructions (Signed)
Plan:  Aim for 2-3 Carb Choices per meal (30-45 grams)   Aim for 0-1 Carbs per snack if hungry  Try to include moderate serving of protein with your meals and snacks. Consider reading food labels for Total Carbohydrate of foods Consider using Food APPS for easy access to Nutrition information Continue with your activity level daily as tolerated Consider checking BG at alternate times per day as directed by MD

## 2013-09-15 ENCOUNTER — Encounter: Payer: Self-pay | Admitting: Cardiology

## 2015-03-08 ENCOUNTER — Emergency Department (HOSPITAL_COMMUNITY)
Admission: EM | Admit: 2015-03-08 | Discharge: 2015-03-08 | Disposition: A | Discharge status: Home / Self Care | Payer: 59 | Attending: Emergency Medicine | Admitting: Emergency Medicine

## 2015-03-08 ENCOUNTER — Encounter (HOSPITAL_COMMUNITY): Payer: Self-pay | Admitting: Emergency Medicine

## 2015-03-08 DIAGNOSIS — Z794 Long term (current) use of insulin: Secondary | ICD-10-CM | POA: Diagnosis not present

## 2015-03-08 DIAGNOSIS — Z79899 Other long term (current) drug therapy: Secondary | ICD-10-CM | POA: Diagnosis not present

## 2015-03-08 DIAGNOSIS — E11649 Type 2 diabetes mellitus with hypoglycemia without coma: Secondary | ICD-10-CM | POA: Diagnosis not present

## 2015-03-08 DIAGNOSIS — E162 Hypoglycemia, unspecified: Secondary | ICD-10-CM

## 2015-03-08 DIAGNOSIS — E669 Obesity, unspecified: Secondary | ICD-10-CM | POA: Diagnosis not present

## 2015-03-08 DIAGNOSIS — Z3202 Encounter for pregnancy test, result negative: Secondary | ICD-10-CM | POA: Insufficient documentation

## 2015-03-08 DIAGNOSIS — I10 Essential (primary) hypertension: Secondary | ICD-10-CM | POA: Diagnosis not present

## 2015-03-08 DIAGNOSIS — Z880 Allergy status to penicillin: Secondary | ICD-10-CM | POA: Insufficient documentation

## 2015-03-08 LAB — BASIC METABOLIC PANEL
Anion gap: 12 (ref 5–15)
BUN: 7 mg/dL (ref 6–20)
CO2: 25 mmol/L (ref 22–32)
Calcium: 7.7 mg/dL — ABNORMAL LOW (ref 8.9–10.3)
Chloride: 102 mmol/L (ref 101–111)
Creatinine, Ser: 0.71 mg/dL (ref 0.44–1.00)
GFR calc Af Amer: >60 mL/min (ref >60)
GFR calc non Af Amer: >60 mL/min (ref >60)
Glucose, Bld: 148 mg/dL — ABNORMAL HIGH (ref 65–99)
Potassium: 3.2 mmol/L — ABNORMAL LOW (ref 3.5–5.1)
Sodium: 139 mmol/L (ref 135–145)

## 2015-03-08 LAB — CBC WITH DIFFERENTIAL/PLATELET
Basophils Absolute: 0 10*3/uL (ref 0.0–0.1)
Basophils Relative: 0 %
Eosinophils Absolute: 0.1 10*3/uL (ref 0.0–0.7)
Eosinophils Relative: 1 %
HCT: 37.7 % (ref 36.0–46.0)
Hemoglobin: 11.8 g/dL — ABNORMAL LOW (ref 12.0–15.0)
Lymphocytes Relative: 19 %
Lymphs Abs: 1.1 10*3/uL (ref 0.7–4.0)
MCH: 26 pg (ref 26.0–34.0)
MCHC: 31.3 g/dL (ref 30.0–36.0)
MCV: 83.2 fL (ref 78.0–100.0)
Monocytes Absolute: 0.3 10*3/uL (ref 0.1–1.0)
Monocytes Relative: 5 %
Neutro Abs: 4.3 10*3/uL (ref 1.7–7.7)
Neutrophils Relative %: 75 %
Platelets: 182 10*3/uL (ref 150–400)
RBC: 4.53 MIL/uL (ref 3.87–5.11)
RDW: 15.4 % (ref 11.5–15.5)
WBC: 5.7 10*3/uL (ref 4.0–10.5)

## 2015-03-08 LAB — I-STAT BETA HCG BLOOD, ED (MC, WL, AP ONLY): I-stat hCG, quantitative: <5 m[IU]/mL (ref <5)

## 2015-03-08 LAB — URINALYSIS, ROUTINE W REFLEX MICROSCOPIC
Bilirubin Urine: NEGATIVE
Glucose, UA: NEGATIVE mg/dL
Hgb urine dipstick: NEGATIVE
Ketones, ur: NEGATIVE mg/dL
Leukocytes, UA: NEGATIVE
Nitrite: NEGATIVE
Protein, ur: NEGATIVE mg/dL
Specific Gravity, Urine: 1.008 (ref 1.005–1.030)
pH: 7 (ref 5.0–8.0)

## 2015-03-08 LAB — CBG MONITORING, ED
Glucose-Capillary: 141 mg/dL — ABNORMAL HIGH (ref 65–99)
Glucose-Capillary: 132 mg/dL — ABNORMAL HIGH (ref 65–99)
Glucose-Capillary: 167 mg/dL — ABNORMAL HIGH (ref 65–99)

## 2015-03-08 MED ORDER — IBUPROFEN 400 MG PO TABS
600.0000 mg | ORAL_TABLET | Freq: Once | ORAL | Status: AC
  Administered 2015-03-08: 600 mg via ORAL
  Filled 2015-03-08: qty 1

## 2015-03-08 NOTE — ED Notes (Signed)
CBG - 141 ° °

## 2015-03-08 NOTE — ED Provider Notes (Signed)
CSN: [id number]     Arrival date & time 03/08/15  [id number] History   First MD Initiated Contact with Patient 03/08/15 [phone]     Chief Complaint  Patient presents with  . Hypoglycemia     (Consider location/radiation/quality/duration/timing/severity/associated sxs/prior Treatment) HPI Comments: 44 year old female with past medical history including hypertension, hyperlipidemia, IDDM who presents with hypoglycemia. Patient woke up feeling jittery this morning and checked her blood sugar and it was 52. She has never had problems with hypoglycemia before. She ate a candy bar and called EMS, who found her with a blood sugar of 159. She reports still feeling slightly weak and jittery but denies any pain. She ate normally yesterday and has not had any recent illness including no fevers, vomiting, diarrhea, abdominal pain, urinary symptoms, or cough/cold symptoms. No recent changes to her insulin regimen.  Patient is a 45 y.o. female presenting with hypoglycemia. The history is provided by the patient.  Hypoglycemia   Past Medical History  Diagnosis Date  . Tachycardia   . Chest pain   . Dizziness   . Obesity   . Diabetes mellitus   . Hyperlipidemia   . Hypertension    Past Surgical History  Procedure Laterality Date  . Wisdom tooth extraction      age 31's   Family History  Problem Relation Age of Onset  . Heart disease Father   . Heart disease Paternal Uncle    Social History  Substance Use Topics  . Smoking status: Never Smoker   . Smokeless tobacco: None  . Alcohol Use: No   OB History    No data available     Review of Systems 10 Systems reviewed and are negative for acute change except as noted in the HPI.    Allergies  Amoxicillin  Home Medications   Prior to Admission medications   Medication Sig Start Date End Date Taking? Authorizing Provider  insulin NPH-regular Human (NOVOLIN 70/30) (70-30) 100 UNIT/ML injection Inject 15-25 Units into the skin 2 (two) times  daily with a meal. Use 25 units in the morning and 15 units in the evening   Yes Historical Provider, MD  levothyroxine (SYNTHROID, LEVOTHROID) 112 MCG tablet Take 112 mcg by mouth daily before breakfast.   Yes Historical Provider, MD  cephALEXin (KEFLEX) 500 MG capsule Take 1 capsule (500 mg total) by mouth 3 (three) times daily. Patient not taking: Reported on 03/08/2015 03/30/13   Harden Mo, MD  levothyroxine (SYNTHROID, LEVOTHROID) 125 MCG tablet Take 1 tablet (125 mcg total) by mouth daily. Patient not taking: Reported on 03/08/2015 08/23/10   Romeo Apple, MD  meloxicam (MOBIC) 15 MG tablet Take 1 tablet (15 mg total) by mouth daily. Patient not taking: Reported on 03/08/2015 03/30/13   Harden Mo, MD  methocarbamol (ROBAXIN) 500 MG tablet Take 1 tablet (500 mg total) by mouth 2 (two) times daily. Patient not taking: Reported on 03/08/2015 02/28/13   Domenic Moras, PA-C  methocarbamol (ROBAXIN) 500 MG tablet Take 1 tablet (500 mg total) by mouth 3 (three) times daily. Patient not taking: Reported on 03/08/2015 03/30/13   Harden Mo, MD  naproxen (NAPROSYN) 500 MG tablet Take 1 tablet (500 mg total) by mouth 2 (two) times daily. Patient not taking: Reported on 03/08/2015 02/28/13   Domenic Moras, PA-C  neomycin-polymyxin-hydrocortisone (CORTISPORIN) 3.5-10000-1 otic suspension Place 3 drops into the left ear 4 (four) times daily. Patient not taking: Reported on 03/08/2015 03/30/13   Harden Mo, MD  traMADol (ULTRAM) 50 MG tablet Take 2 tablets (100 mg total) by mouth every 8 (eight) hours as needed. Patient not taking: Reported on 03/08/2015 03/30/13   Harden Mo, MD   BP 176/99 mmHg  Pulse 108  Temp(Src) 98.2 F (36.8 C) (Oral)  Resp 18  SpO2 98%  LMP 02/06/2015 (Approximate) Physical Exam  Constitutional: She is oriented to person, place, and time. She appears well-developed and well-nourished. No distress.  HENT:  Head: Normocephalic and atraumatic.  Moist mucous membranes   Eyes: Conjunctivae are normal. Pupils are equal, round, and reactive to light.  Neck: Neck supple.  Cardiovascular: Normal rate, regular rhythm and normal heart sounds.   No murmur heard. Pulmonary/Chest: Effort normal and breath sounds normal.  Abdominal: Soft. Bowel sounds are normal. She exhibits no distension. There is no tenderness.  Musculoskeletal: She exhibits no edema.  Neurological: She is alert and oriented to person, place, and time.  Fluent speech  Skin: Skin is warm and dry.  Psychiatric: She has a normal mood and affect. Judgment normal.  Nursing note and vitals reviewed.   ED Course  Procedures (including critical care time) Labs Review Labs Reviewed  BASIC METABOLIC PANEL - Abnormal; Notable for the following:    Potassium 3.2 (*)    Glucose, Bld 148 (*)    Calcium 7.7 (*)    All other components within normal limits  CBC WITH DIFFERENTIAL/PLATELET - Abnormal; Notable for the following:    Hemoglobin 11.8 (*)    All other components within normal limits  URINALYSIS, ROUTINE W REFLEX MICROSCOPIC (NOT AT [facility]) - Abnormal; Notable for the following:    APPearance HAZY (*)    All other components within normal limits  CBG MONITORING, ED - Abnormal; Notable for the following:    Glucose-Capillary 141 (*)    All other components within normal limits  CBG MONITORING, ED - Abnormal; Notable for the following:    Glucose-Capillary 132 (*)    All other components within normal limits  CBG MONITORING, ED - Abnormal; Notable for the following:    Glucose-Capillary 167 (*)    All other components within normal limits  I-STAT BETA HCG BLOOD, ED ([facility], [facility], [facility] ONLY)    Imaging Review No results found. I have personally reviewed and evaluated these lab results as part of my medical decision-making.   EKG Interpretation None      MDM   Final diagnoses:  Hypoglycemia   Patient presenting for evaluation after having an episode of hypoglycemia at home this morning.  Patient well-appearing on arrival with stable vital signs. No abdominal tenderness and no complaints. She was eating and drinking during my evaluation. Obtained basic labs as well as UA to rule out infection. Observed patient for a few hours and rechecked blood sugar which was stable at 167. The remainder of her labs including UA were unremarkable. On reexamination, the patient was well-appearing and was eating peanut butter and crackers. She denies any infectious symptoms to suggest infection as cause of her hypoglycemia. She has been on same regimen for a long time and therefore I do not feel she needs insulin adjustment currently. I have instructed her to contact her endocrinologist today to discuss incident and discuss her current medications. Patient has voiced understanding and discharged in satisfactory condition.   Sharlett Iles, MD 03/08/15 [phone]

## 2015-03-08 NOTE — ED Notes (Signed)
CBG 132. 

## 2015-03-08 NOTE — ED Notes (Signed)
Encouraged pt to eat more of her food.  Refused to eat [place] sandwich.

## 2015-03-08 NOTE — ED Notes (Signed)
Pt from home via [facility] with c/o feeling jittery when waking up.  Pt took her blood sugar and it was 52.  She ate a candy bar and it has increased to 159 with EMS.  Pt reports feeling weak and jittery still, requesting evaluation.  On arrival CBG 141, giving sandwich and juice.  NAD, A&O.

## 2015-03-08 NOTE — Discharge Instructions (Signed)

## 2015-03-08 NOTE — ED Notes (Signed)
CBG 167 

## 2015-03-13 LAB — MICROALBUMIN, URINE: Microalb, Ur: 9.52

## 2015-04-17 ENCOUNTER — Other Ambulatory Visit: Payer: Self-pay | Admitting: Orthopaedic Surgery

## 2015-04-17 DIAGNOSIS — M5441 Lumbago with sciatica, right side: Secondary | ICD-10-CM

## 2015-04-28 ENCOUNTER — Ambulatory Visit
Admission: RE | Admit: 2015-04-28 | Discharge: 2015-04-28 | Disposition: A | Discharge status: Home / Self Care | Payer: Self-pay | Source: Ambulatory Visit | Attending: Orthopaedic Surgery | Admitting: Orthopaedic Surgery

## 2015-04-28 DIAGNOSIS — M5441 Lumbago with sciatica, right side: Secondary | ICD-10-CM

## 2015-07-24 LAB — HM PAP SMEAR

## 2016-05-06 LAB — TSH: TSH: 2.02 u[IU]/mL (ref <=5.90)

## 2016-05-06 LAB — HM DIABETES FOOT EXAM

## 2016-05-06 LAB — HEMOGLOBIN A1C: Hemoglobin A1C: 9.4

## 2016-05-20 LAB — HEPATIC FUNCTION PANEL
ALT: 22 U/L (ref 7–35)
AST: 20 U/L (ref 13–35)
Alkaline Phosphatase: 133 U/L — AB (ref 25–125)
Bilirubin, Total: 0.4 mg/dL

## 2016-05-20 LAB — BASIC METABOLIC PANEL
BUN: 9 mg/dL (ref 4–21)
Creatinine: 0.8 mg/dL (ref <=1.1)
Glucose: 253 mg/dL
Potassium: 3.9 mmol/L (ref 3.4–5.3)
Sodium: 139 mmol/L (ref 137–147)

## 2016-05-26 ENCOUNTER — Other Ambulatory Visit: Payer: Self-pay | Admitting: Internal Medicine

## 2016-05-26 DIAGNOSIS — R7989 Other specified abnormal findings of blood chemistry: Secondary | ICD-10-CM

## 2016-05-26 DIAGNOSIS — R945 Abnormal results of liver function studies: Principal | ICD-10-CM

## 2016-06-02 ENCOUNTER — Other Ambulatory Visit: Payer: Self-pay

## 2016-06-09 ENCOUNTER — Ambulatory Visit
Admission: RE | Admit: 2016-06-09 | Discharge: 2016-06-09 | Disposition: A | Discharge status: Home / Self Care | Payer: BLUE CROSS/BLUE SHIELD | Source: Ambulatory Visit | Attending: Internal Medicine | Admitting: Internal Medicine

## 2016-06-09 DIAGNOSIS — R945 Abnormal results of liver function studies: Principal | ICD-10-CM

## 2016-06-09 DIAGNOSIS — R7989 Other specified abnormal findings of blood chemistry: Secondary | ICD-10-CM

## 2016-07-07 ENCOUNTER — Encounter: Payer: Self-pay | Admitting: Adult Health

## 2016-07-07 ENCOUNTER — Ambulatory Visit (INDEPENDENT_AMBULATORY_CARE_PROVIDER_SITE_OTHER): Payer: BLUE CROSS/BLUE SHIELD | Admitting: Adult Health

## 2016-07-07 VITALS — BP 147/89 | HR 102 | Ht 64.0 in | Wt 190.2 lb

## 2016-07-07 DIAGNOSIS — I10 Essential (primary) hypertension: Secondary | ICD-10-CM | POA: Diagnosis not present

## 2016-07-07 DIAGNOSIS — E785 Hyperlipidemia, unspecified: Secondary | ICD-10-CM | POA: Diagnosis not present

## 2016-07-07 DIAGNOSIS — E6609 Other obesity due to excess calories: Secondary | ICD-10-CM | POA: Diagnosis not present

## 2016-07-07 DIAGNOSIS — E119 Type 2 diabetes mellitus without complications: Secondary | ICD-10-CM | POA: Insufficient documentation

## 2016-07-07 DIAGNOSIS — E039 Hypothyroidism, unspecified: Secondary | ICD-10-CM | POA: Diagnosis not present

## 2016-07-07 DIAGNOSIS — Z6832 Body mass index (BMI) 32.0-32.9, adult: Secondary | ICD-10-CM | POA: Diagnosis not present

## 2016-07-07 LAB — POCT GLYCOSYLATED HEMOGLOBIN (HGB A1C): Hemoglobin A1C: 7.9

## 2016-07-07 MED ORDER — ALPRAZOLAM 0.5 MG PO TBDP: 0.5000 mg | ORAL_TABLET | Freq: Two times a day (BID) | ORAL | 0 refills | Status: DC | PRN

## 2016-07-07 NOTE — Patient Instructions (Signed)
Carbohydrate Counting for Diabetes Mellitus, Adult Carbohydrate counting is a method for keeping track of how many carbohydrates you eat. Eating carbohydrates naturally increases the amount of sugar (glucose) in the blood. Counting how many carbohydrates you eat helps keep your blood glucose within normal limits, which helps you manage your diabetes (diabetes mellitus). It is important to know how many carbohydrates you can safely have in each meal. This is different for every person. A diet and nutrition specialist (registered dietitian) can help you make a meal plan and calculate how many carbohydrates you should have at each meal and snack. Carbohydrates are found in the following foods:  Grains, such as breads and cereals.  Dried beans and soy products.  Starchy vegetables, such as potatoes, peas, and corn.  Fruit and fruit juices.  Milk and yogurt.  Sweets and snack foods, such as cake, cookies, candy, chips, and soft drinks. How do I count carbohydrates? There are two ways to count carbohydrates in food. You can use either of the methods or a combination of both. Reading "Nutrition Facts" on packaged food  The "Nutrition Facts" list is included on the labels of almost all packaged foods and beverages in the [place] It includes:  The serving size.  Information about nutrients in each serving, including the grams (g) of carbohydrate per serving. To use the "Nutrition Facts":  Decide how many servings you will have.  Multiply the number of servings by the number of carbohydrates per serving.  The resulting number is the total amount of carbohydrates that you will be having. Learning standard serving sizes of other foods  When you eat foods containing carbohydrates that are not packaged or do not include "Nutrition Facts" on the label, you need to measure the servings in order to count the amount of carbohydrates:  Measure the foods that you will eat with a food scale or measuring  cup, if needed.  Decide how many standard-size servings you will eat.  Multiply the number of servings by 15. Most carbohydrate-rich foods have about 15 g of carbohydrates per serving.  For example, if you eat 8 oz (170 g) of strawberries, you will have eaten 2 servings and 30 g of carbohydrates (2 servings x 15 g = 30 g).  For foods that have more than one food mixed, such as soups and casseroles, you must count the carbohydrates in each food that is included. The following list contains standard serving sizes of common carbohydrate-rich foods. Each of these servings has about 15 g of carbohydrates:   hamburger bun or  English muffin.   oz (15 mL) syrup.   oz (14 g) jelly.  1 slice of bread.  1 six-inch tortilla.  3 oz (85 g) cooked rice or pasta.  4 oz (113 g) cooked dried beans.  4 oz (113 g) starchy vegetable, such as peas, corn, or potatoes.  4 oz (113 g) hot cereal.  4 oz (113 g) mashed potatoes or  of a large baked potato.  4 oz (113 g) canned or frozen fruit.  4 oz (120 mL) fruit juice.  4-6 crackers.  6 chicken nuggets.  6 oz (170 g) unsweetened dry cereal.  6 oz (170 g) plain fat-free yogurt or yogurt sweetened with artificial sweeteners.  8 oz (240 mL) milk.  8 oz (170 g) fresh fruit or one small piece of fruit.  24 oz (680 g) popped popcorn. Example of carbohydrate counting Sample meal   3 oz (85 g) chicken breast.  6  oz (170 g) brown rice.  4 oz (113 g) corn.  8 oz (240 mL) milk.  8 oz (170 g) strawberries with sugar-free whipped topping. Carbohydrate calculation  1. Identify the foods that contain carbohydrates:  Rice.  Corn.  Milk.  Strawberries. 2. Calculate how many servings you have of each food:  2 servings rice.  1 serving corn.  1 serving milk.  1 serving strawberries. 3. Multiply each number of servings by 15 g:  2 servings rice x 15 g = 30 g.  1 serving corn x 15 g = 15 g.  1 serving milk x 15 g = 15  g.  1 serving strawberries x 15 g = 15 g. 4. Add together all of the amounts to find the total grams of carbohydrates eaten:  30 g + 15 g + 15 g + 15 g = 75 g of carbohydrates total. This information is not intended to replace advice given to you by your health care provider. Make sure you discuss any questions you have with your health care provider. Document Released: 03/10/2005 Document Revised: 09/28/2015 Document Reviewed: 08/22/2015 Elsevier Interactive Patient Education  2017 [employer]   Hypertension Hypertension, commonly called high blood pressure, is when the force of blood pumping through the arteries is too strong. The arteries are the blood vessels that carry blood from the heart throughout the body. Hypertension forces the heart to work harder to pump blood and may cause arteries to become narrow or stiff. Having untreated or uncontrolled hypertension can cause heart attacks, strokes, kidney disease, and other problems. A blood pressure reading consists of a higher number over a lower number. Ideally, your blood pressure should be below 120/80. The first ("top") number is called the systolic pressure. It is a measure of the pressure in your arteries as your heart beats. The second ("bottom") number is called the diastolic pressure. It is a measure of the pressure in your arteries as the heart relaxes. What are the causes? The cause of this condition is not known. What increases the risk? Some risk factors for high blood pressure are under your control. Others are not. Factors you can change   Smoking.  Having type 2 diabetes mellitus, high cholesterol, or both.  Not getting enough exercise or physical activity.  Being overweight.  Having too much fat, sugar, calories, or salt (sodium) in your diet.  Drinking too much alcohol. Factors that are difficult or impossible to change   Having chronic kidney disease.  Having a family history of high blood  pressure.  Age. Risk increases with age.  Race. You may be at higher risk if you are African-American.  Gender. Men are at higher risk than women before age 48. After age 58, women are at higher risk than men.  Having obstructive sleep apnea.  Stress. What are the signs or symptoms? Extremely high blood pressure (hypertensive crisis) may cause:  Headache.  Anxiety.  Shortness of breath.  Nosebleed.  Nausea and vomiting.  Severe chest pain.  Jerky movements you cannot control (seizures). How is this diagnosed? This condition is diagnosed by measuring your blood pressure while you are seated, with your arm resting on a surface. The cuff of the blood pressure monitor will be placed directly against the skin of your upper arm at the level of your heart. It should be measured at least twice using the same arm. Certain conditions can cause a difference in blood pressure between your right and left arms. Certain factors  can cause blood pressure readings to be lower or higher than normal (elevated) for a short period of time:  When your blood pressure is higher when you are in a health care provider's office than when you are at home, this is called white coat hypertension. Most people with this condition do not need medicines.  When your blood pressure is higher at home than when you are in a health care provider's office, this is called masked hypertension. Most people with this condition may need medicines to control blood pressure. If you have a high blood pressure reading during one visit or you have normal blood pressure with other risk factors:  You may be asked to return on a different day to have your blood pressure checked again.  You may be asked to monitor your blood pressure at home for 1 week or longer. If you are diagnosed with hypertension, you may have other blood or imaging tests to help your health care provider understand your overall risk for other conditions. How  is this treated? This condition is treated by making healthy lifestyle changes, such as eating healthy foods, exercising more, and reducing your alcohol intake. Your health care provider may prescribe medicine if lifestyle changes are not enough to get your blood pressure under control, and if:  Your systolic blood pressure is above 130.  Your diastolic blood pressure is above 80. Your personal target blood pressure may vary depending on your medical conditions, your age, and other factors. Follow these instructions at home: Eating and drinking   Eat a diet that is high in fiber and potassium, and low in sodium, added sugar, and fat. An example eating plan is called the DASH (Dietary Approaches to Stop Hypertension) diet. To eat this way:  Eat plenty of fresh fruits and vegetables. Try to fill half of your plate at each meal with fruits and vegetables.  Eat whole grains, such as whole wheat pasta, brown rice, or whole grain bread. Fill about one quarter of your plate with whole grains.  Eat or drink low-fat dairy products, such as skim milk or low-fat yogurt.  Avoid fatty cuts of meat, processed or cured meats, and poultry with skin. Fill about one quarter of your plate with lean proteins, such as fish, chicken without skin, beans, eggs, and tofu.  Avoid premade and processed foods. These tend to be higher in sodium, added sugar, and fat.  Reduce your daily sodium intake. Most people with hypertension should eat less than 1,500 mg of sodium a day.  Limit alcohol intake to no more than 1 drink a day for nonpregnant women and 2 drinks a day for men. One drink equals 12 oz of beer, 5 oz of wine, or 1 oz of hard liquor. Lifestyle   Work with your health care provider to maintain a healthy body weight or to lose weight. Ask what an ideal weight is for you.  Get at least 30 minutes of exercise that causes your heart to beat faster (aerobic exercise) most days of the week. Activities may  include walking, swimming, or biking.  Include exercise to strengthen your muscles (resistance exercise), such as pilates or lifting weights, as part of your weekly exercise routine. Try to do these types of exercises for 30 minutes at least 3 days a week.  Do not use any products that contain nicotine or tobacco, such as cigarettes and e-cigarettes. If you need help quitting, ask your health care provider.  Monitor your blood pressure at  home as told by your health care provider.  Keep all follow-up visits as told by your health care provider. This is important. Medicines   Take over-the-counter and prescription medicines only as told by your health care provider. Follow directions carefully. Blood pressure medicines must be taken as prescribed.  Do not skip doses of blood pressure medicine. Doing this puts you at risk for problems and can make the medicine less effective.  Ask your health care provider about side effects or reactions to medicines that you should watch for. Contact a health care provider if:  You think you are having a reaction to a medicine you are taking.  You have headaches that keep coming back (recurring).  You feel dizzy.  You have swelling in your ankles.  You have trouble with your vision. Get help right away if:  You develop a severe headache or confusion.  You have unusual weakness or numbness.  You feel faint.  You have severe pain in your chest or abdomen.  You vomit repeatedly.  You have trouble breathing. Summary  Hypertension is when the force of blood pumping through your arteries is too strong. If this condition is not controlled, it may put you at risk for serious complications.  Your personal target blood pressure may vary depending on your medical conditions, your age, and other factors. For most people, a normal blood pressure is less than 120/80.  Hypertension is treated with lifestyle changes, medicines, or a combination of both.  Lifestyle changes include weight loss, eating a healthy, low-sodium diet, exercising more, and limiting alcohol. This information is not intended to replace advice given to you by your health care provider. Make sure you discuss any questions you have with your health care provider. Document Released: 03/10/2005 Document Revised: 02/06/2016 Document Reviewed: 02/06/2016 Elsevier Interactive Patient Education  2017 [employer].  Please schedule fasting lab nurse visit. Continue regular follow-up with Endocrinologist. Continue all medications as directed. Increase regular movement, recommend at least 65mins 5 times week. Follow-up every 3 months, sooner if needed.

## 2016-07-07 NOTE — Assessment & Plan Note (Signed)
TSH 05/06/2016- 2.020 Continue Levothyroxine 112 mcg daily.

## 2016-07-07 NOTE — Progress Notes (Signed)
Pt states that microalbumin, A1c and microalbumins are followed by Dr. Buddy Duty and refused testing today other than A1c.  Charyl Bigger, CMA

## 2016-07-07 NOTE — Progress Notes (Signed)
Subjective:    Patient ID: Beverly Schwartz, female    DOB: 1970-12-28, 46 y.o.   MRN: [id number]  HPI:  Beverly Schwartz is here to establish as a new pt.  She is a very pleasant 46 year old female.  PMH;  Diabetes, HTN, Hypothyroidism, GAD, and obesity.  She reports medication compliance and denies SE.  She started "Medifast Diet" last month and has last 8lbs. She plans increasing regular movement next week, per plan recommendations.  She is followed by [facility] Endocrinology for the last 7 years for her diabetes, last OV 04/2016 with next f/u 07/2016. Last A1c 05/06/16 Today her A1c is 7.9 She denies tobacco use and rarly drinks ETOH.  She reports hx of "fatty liver" and thinks her last lipid panel was last year (however POC reflects last lipid panel 02/2015 with excessively elevated TGs 480).  She states that she only took the cholesterol medication "for a few months and then I stopped bc my body didn't like it".  Patient Care Team    Relationship Specialty Notifications Start End  Odella Aquas, NP PCP - General Family Medicine  07/07/16   Delrae Rend, MD Consulting Physician Endocrinology  07/07/16     Patient Active Problem List   Diagnosis Date Noted  . Diabetes mellitus without complication ([place]) 73/53/2992  . Hypothyroid 07/07/2016  . Hypertension 07/07/2016  . Hyperlipidemia 08/06/2010  . Tachycardia   . Chest pain   . Dizziness   . Obesity      Past Medical History:  Diagnosis Date  . Anxiety   . Chest pain   . Depression   . Diabetes mellitus   . Dizziness   . Hyperlipidemia   . Hypertension   . Obesity   . Tachycardia      Past Surgical History:  Procedure Laterality Date  . WISDOM TOOTH EXTRACTION     age 80's     Family History  Problem Relation Age of Onset  . Heart disease Father   . Diabetes Father   . Healthy Mother   . Hyperlipidemia Sister   . Healthy Brother   . Heart disease Paternal Uncle   . Heart attack Paternal Uncle   . Healthy Son       History  Drug Use No     History  Alcohol Use No     History  Smoking Status  . Never Smoker  Smokeless Tobacco  . Never Used     Outpatient Encounter Prescriptions as of 07/07/2016  Medication Sig  . insulin NPH-regular Human (NOVOLIN 70/30) (70-30) 100 UNIT/ML injection Inject 25 Units into the skin 2 (two) times daily with a meal. Use 25 units in the morning and 15 units in the evening   . levothyroxine (SYNTHROID, LEVOTHROID) 112 MCG tablet Take 112 mcg by mouth daily before breakfast.  . ALPRAZolam (NIRAVAM) 0.5 MG dissolvable tablet Take 1 tablet (0.5 mg total) by mouth 2 (two) times daily as needed for anxiety.  Marland Kitchen losartan (COZAAR) 50 MG tablet Take 50 mg by mouth daily.  . [DISCONTINUED] cephALEXin (KEFLEX) 500 MG capsule Take 1 capsule (500 mg total) by mouth 3 (three) times daily. (Patient not taking: Reported on 03/08/2015)  . [DISCONTINUED] levothyroxine (SYNTHROID, LEVOTHROID) 125 MCG tablet Take 1 tablet (125 mcg total) by mouth daily. (Patient not taking: Reported on 03/08/2015)  . [DISCONTINUED] meloxicam (MOBIC) 15 MG tablet Take 1 tablet (15 mg total) by mouth daily. (Patient not taking: Reported on 03/08/2015)  . [DISCONTINUED]  methocarbamol (ROBAXIN) 500 MG tablet Take 1 tablet (500 mg total) by mouth 2 (two) times daily. (Patient not taking: Reported on 03/08/2015)  . [DISCONTINUED] methocarbamol (ROBAXIN) 500 MG tablet Take 1 tablet (500 mg total) by mouth 3 (three) times daily. (Patient not taking: Reported on 03/08/2015)  . [DISCONTINUED] naproxen (NAPROSYN) 500 MG tablet Take 1 tablet (500 mg total) by mouth 2 (two) times daily. (Patient not taking: Reported on 03/08/2015)  . [DISCONTINUED] neomycin-polymyxin-hydrocortisone (CORTISPORIN) 3.5-10000-1 otic suspension Place 3 drops into the left ear 4 (four) times daily. (Patient not taking: Reported on 03/08/2015)  . [DISCONTINUED] traMADol (ULTRAM) 50 MG tablet Take 2 tablets (100 mg total) by mouth every  8 (eight) hours as needed. (Patient not taking: Reported on 03/08/2015)   No facility-administered encounter medications on file as of 07/07/2016.     Allergies: Amoxicillin  Body mass index is 32.65 kg/m.  Blood pressure (!) 147/89, pulse (!) 102, height 5\' 4"  (1.626 m), weight 190 lb 3.2 oz (86.3 kg), last menstrual period 05/23/2016.    Review of Systems  Constitutional: Negative for activity change, appetite change, chills, diaphoresis, fatigue, fever and unexpected weight change.  HENT: Positive for congestion and postnasal drip.   Eyes: Negative for visual disturbance.  Respiratory: Negative for cough, chest tightness, shortness of breath, wheezing and stridor.   Cardiovascular: Negative for chest pain, palpitations and leg swelling.  Gastrointestinal: Negative for abdominal distention, abdominal pain, blood in stool, constipation, diarrhea, nausea and vomiting.  Endocrine: Negative for cold intolerance, heat intolerance, polydipsia, polyphagia and polyuria.  Genitourinary: Negative for difficulty urinating and flank pain.  Musculoskeletal: Negative for arthralgias, back pain, gait problem, joint swelling, myalgias, neck pain and neck stiffness.  Skin: Negative for color change, pallor, rash and wound.  Allergic/Immunologic: Negative for immunocompromised state.  Neurological: Negative for dizziness, tremors, weakness, light-headedness and headaches.  Hematological: Does not bruise/bleed easily.  Psychiatric/Behavioral: Negative for agitation, behavioral problems, confusion, decreased concentration, dysphoric mood, hallucinations, self-injury, sleep disturbance and suicidal ideas. The patient is nervous/anxious. The patient is not hyperactive.        Objective:   Physical Exam  Constitutional: She is oriented to person, place, and time. She appears well-developed and well-nourished. No distress.  HENT:  Head: Normocephalic and atraumatic.  Right Ear: Hearing, tympanic  membrane and external ear normal. No decreased hearing is noted.  Left Ear: Hearing, tympanic membrane, external ear and ear canal normal. No decreased hearing is noted.  Nose: Nose normal. Right sinus exhibits no maxillary sinus tenderness and no frontal sinus tenderness. Left sinus exhibits no maxillary sinus tenderness and no frontal sinus tenderness.  Mouth/Throat: Uvula is midline.  Eyes: Conjunctivae are normal. Pupils are equal, round, and reactive to light.  Neck: Normal range of motion. Neck supple.  Cardiovascular: Normal rate, regular rhythm, normal heart sounds and intact distal pulses.   No murmur heard. Pulmonary/Chest: Effort normal and breath sounds normal. No respiratory distress. She has no wheezes. She has no rales. She exhibits no tenderness.  Musculoskeletal: Normal range of motion.  Neurological: She is alert and oriented to person, place, and time.  Skin: Skin is warm and dry. She is not diaphoretic. No pallor.  Red flaky skin noted on face, neck, around ears. No open tissue noted.  Psychiatric: She has a normal mood and affect. Her behavior is normal. Judgment and thought content normal.  Vitals reviewed.         Assessment & Plan:   1. Diabetes mellitus without complication ([place])  2. Hyperlipidemia, unspecified hyperlipidemia type   3. Class 1 obesity due to excess calories without serious comorbidity with body mass index (BMI) of 32.0 to 32.9 in adult   4. Hypothyroidism, unspecified type   5. Essential hypertension     Hyperlipidemia Will check lipid panel ASAP. Last lipids 02/2015 HDL 47 LDL 145 Tot 262 TG 480   Hypothyroid TSH 05/06/2016- 2.020 Continue Levothyroxine 112 mcg daily.  Diabetes mellitus without complication ([place]) 07/08/3843 A1c 9.4 07/07/16 A1c 7.9 Continue Novolin 70/30 per Endocrinology.  Hypertension Continue Losartan 50mg  daily. Reduce Na+ intake.    FOLLOW-UP:  Return in about 3 months (around 10/06/2016) for Regular  Follow Up, Diabetes, HTN.

## 2016-07-07 NOTE — Assessment & Plan Note (Signed)
05/06/2016 A1c 9.4 07/07/16 A1c 7.9 Continue Novolin 70/30 per Endocrinology.

## 2016-07-07 NOTE — Assessment & Plan Note (Signed)
Will check lipid panel ASAP. Last lipids 02/2015 HDL 47 LDL 145 Tot 262 TG 480

## 2016-07-07 NOTE — Assessment & Plan Note (Signed)
Continue Losartan 50mg  daily. Reduce Na+ intake.

## 2016-07-08 ENCOUNTER — Other Ambulatory Visit: Payer: Self-pay

## 2016-07-08 DIAGNOSIS — E119 Type 2 diabetes mellitus without complications: Secondary | ICD-10-CM

## 2016-07-08 DIAGNOSIS — R42 Dizziness and giddiness: Secondary | ICD-10-CM

## 2016-07-08 DIAGNOSIS — E039 Hypothyroidism, unspecified: Secondary | ICD-10-CM

## 2016-07-08 DIAGNOSIS — I10 Essential (primary) hypertension: Secondary | ICD-10-CM

## 2016-07-08 DIAGNOSIS — E7849 Other hyperlipidemia: Secondary | ICD-10-CM

## 2016-07-10 ENCOUNTER — Other Ambulatory Visit (INDEPENDENT_AMBULATORY_CARE_PROVIDER_SITE_OTHER): Payer: BLUE CROSS/BLUE SHIELD

## 2016-07-10 DIAGNOSIS — R42 Dizziness and giddiness: Secondary | ICD-10-CM

## 2016-07-10 DIAGNOSIS — I10 Essential (primary) hypertension: Secondary | ICD-10-CM

## 2016-07-10 DIAGNOSIS — E119 Type 2 diabetes mellitus without complications: Secondary | ICD-10-CM

## 2016-07-10 DIAGNOSIS — E039 Hypothyroidism, unspecified: Secondary | ICD-10-CM

## 2016-07-10 DIAGNOSIS — E7849 Other hyperlipidemia: Secondary | ICD-10-CM

## 2016-07-10 NOTE — Addendum Note (Signed)
Addended by: Fonnie Mu on: 07/10/2016 09:24 AM   Modules accepted: Orders

## 2016-07-11 LAB — LIPID PANEL
Chol/HDL Ratio: 5.1 ratio — ABNORMAL HIGH (ref 0.0–4.4)
Cholesterol, Total: 202 mg/dL — ABNORMAL HIGH (ref 100–199)
HDL: 40 mg/dL (ref >39)
LDL Calculated: 114 mg/dL — ABNORMAL HIGH (ref 0–99)
Triglycerides: 240 mg/dL — ABNORMAL HIGH (ref 0–149)
VLDL Cholesterol Cal: 48 mg/dL — ABNORMAL HIGH (ref 5–40)

## 2016-07-11 LAB — VITAMIN D 25 HYDROXY (VIT D DEFICIENCY, FRACTURES): Vit D, 25-Hydroxy: 28.6 ng/mL — ABNORMAL LOW (ref 30.0–100.0)

## 2016-07-22 ENCOUNTER — Ambulatory Visit (INDEPENDENT_AMBULATORY_CARE_PROVIDER_SITE_OTHER): Payer: BLUE CROSS/BLUE SHIELD | Admitting: Adult Health

## 2016-07-22 ENCOUNTER — Encounter: Payer: Self-pay | Admitting: Adult Health

## 2016-07-22 VITALS — BP 153/85 | HR 95 | Ht 65.0 in | Wt 187.1 lb

## 2016-07-22 DIAGNOSIS — E1169 Type 2 diabetes mellitus with other specified complication: Secondary | ICD-10-CM

## 2016-07-22 DIAGNOSIS — I10 Essential (primary) hypertension: Secondary | ICD-10-CM | POA: Diagnosis not present

## 2016-07-22 DIAGNOSIS — E119 Type 2 diabetes mellitus without complications: Secondary | ICD-10-CM | POA: Diagnosis not present

## 2016-07-22 DIAGNOSIS — E785 Hyperlipidemia, unspecified: Secondary | ICD-10-CM | POA: Diagnosis not present

## 2016-07-22 DIAGNOSIS — E784 Other hyperlipidemia: Secondary | ICD-10-CM

## 2016-07-22 DIAGNOSIS — E7849 Other hyperlipidemia: Secondary | ICD-10-CM

## 2016-07-22 LAB — POCT UA - MICROALBUMIN
Creatinine, POC: 300 mg/dL
Microalbumin Ur, POC: 80 mg/L

## 2016-07-22 MED ORDER — FENOFIBRATE 48 MG PO TABS: 48.0000 mg | ORAL_TABLET | Freq: Every day | ORAL | 2 refills | Status: DC

## 2016-07-22 NOTE — Assessment & Plan Note (Signed)
Please start fenofibrate 48 mg daily.  Please follow low cholesterol/fat dit. Increase regular movement (I.e. Walking and elliptical). She refuses statin therapy. Please fax [place] your recent liver function lab results. Please return in 6 weeks for fasting lab draw-nurse visit. Please call us with any questions/concerns.

## 2016-07-22 NOTE — Assessment & Plan Note (Signed)
Continue Losartan 50mg  PO daily

## 2016-07-22 NOTE — Patient Instructions (Addendum)
Dyslipidemia Dyslipidemia is an imbalance of waxy, fat-like substances (lipids) in the blood. The body needs lipids in small amounts. Dyslipidemia often involves a high level of cholesterol or triglycerides, which are types of lipids. Common forms of dyslipidemia include:  High levels of bad cholesterol (LDL cholesterol). LDL is the type of cholesterol that causes fatty deposits (plaques) to build up in the blood vessels that carry blood away from your heart (arteries).  Low levels of good cholesterol (HDL cholesterol). HDL cholesterol is the type of cholesterol that protects against heart disease. High levels of HDL remove the LDL buildup from arteries.  High levels of triglycerides. Triglycerides are a fatty substance in the blood that is linked to a buildup of plaques in the arteries. You can develop dyslipidemia because of the genes you are born with (primary dyslipidemia) or changes that occur during your life (secondary dyslipidemia), or as a side effect of certain medical treatments. What are the causes? Primary dyslipidemia is caused by changes (mutations) in genes that are passed down through families (inherited). These mutations cause several types of dyslipidemia. Mutations can result in disorders that make the body produce too much LDL cholesterol or triglycerides, or not enough HDL cholesterol. These disorders may lead to heart disease, arterial disease, or stroke at an early age. Causes of secondary dyslipidemia include certain lifestyle choices and diseases that lead to dyslipidemia, such as:  Eating a diet that is high in animal fat.  Not getting enough activity or exercise (having a sedentary lifestyle).  Having diabetes, kidney disease, liver disease, or thyroid disease.  Drinking large amounts of alcohol.  Using certain types of drugs. What increases the risk? You may be at greater risk for dyslipidemia if you are an older man or if you are a woman who has gone through  menopause. Other risk factors include:  Having a family history of dyslipidemia.  Taking certain medicines, including birth control pills, steroids, some diuretics, beta-blockers, and some medicines forHIV.  Smoking cigarettes.  Eating a high-fat diet.  Drinking large amounts of alcohol.  Having certain medical conditions such as diabetes, polycystic ovary syndrome (PCOS), pregnancy, kidney disease, liver disease, or hypothyroidism.  Not exercising regularly.  Being overweight or obese with too much belly fat. What are the signs or symptoms? Dyslipidemia does not usually cause any symptoms. Very high lipid levels can cause fatty bumps under the skin (xanthomas) or a white or gray ring around the black center (pupil) of the eye. Very high triglyceride levels can cause inflammation of the pancreas (pancreatitis). How is this diagnosed? Your health care provider may diagnose dyslipidemia based on a routine blood test (fasting blood test). Because most people do not have symptoms of the condition, this blood testing (lipid profile) is done on adults age 47 and older and is repeated every 5 years. This test checks:  Total cholesterol. This is a measure of the total amount of cholesterol in your blood, including LDL cholesterol, HDL cholesterol, and triglycerides. A healthy number is below 200.  LDL cholesterol. The target number for LDL cholesterol is different for each person, depending on individual risk factors. For most people, a number below 100 is healthy. Ask your health care provider what your LDL cholesterol number should be.  HDL cholesterol. An HDL level of 60 or higher is best because it helps to protect against heart disease. A number below 68 for men or below 69 for women increases the risk for heart disease.  Triglycerides. A healthy triglyceride number  is below 150. If your lipid profile is abnormal, your health care provider may do other blood tests to get more information  about your condition. How is this treated? Treatment depends on the type of dyslipidemia that you have and your other risk factors for heart disease and stroke. Your health care provider will have a target range for your lipid levels based on this information. For many people, treatment starts with lifestyle changes, such as diet and exercise. Your health care provider may recommend that you:  Get regular exercise.  Make changes to your diet.  Quit smoking if you smoke. If diet changes and exercise do not help you reach your goals, your health care provider may also prescribe medicine to lower lipids. The most commonly prescribed type of medicine lowers your LDL cholesterol (statin drug). If you have a high triglyceride level, your provider may prescribe another type of drug (fibrate) or an omega-3 fish oil supplement, or both. Follow these instructions at home:  Take over-the-counter and prescription medicines only as told by your health care provider. This includes supplements.  Get regular exercise. Start an aerobic exercise and strength training program as told by your health care provider. Ask your health care provider what activities are safe for you. Your health care provider may recommend:  30 minutes of aerobic activity 4-6 days a week. Brisk walking is an example of aerobic activity.  Strength training 2 days a week.  Eat a healthy diet as told by your health care provider. This can help you reach and maintain a healthy weight, lower your LDL cholesterol, and raise your HDL cholesterol. It may help to work with a diet and nutrition specialist (dietitian) to make a plan that is right for you. Your dietitian or health care provider may recommend:  Limiting your calories, if you are overweight.  Eating more fruits, vegetables, whole grains, fish, and lean meats.  Limiting saturated fat, trans fat, and cholesterol.  Follow instructions from your health care provider or dietitian  about eating or drinking restrictions.  Limit alcohol intake to no more than one drink per day for nonpregnant women and two drinks per day for men. One drink equals 12 oz of beer, 5 oz of wine, or 1 oz of hard liquor.  Do not use any products that contain nicotine or tobacco, such as cigarettes and e-cigarettes. If you need help quitting, ask your health care provider.  Keep all follow-up visits as told by your health care provider. This is important. Contact a health care provider if:  You are having trouble sticking to your exercise or diet plan.  You are struggling to quit smoking or control your use of alcohol. Summary  Dyslipidemia is an imbalance of waxy, fat-like substances (lipids) in the blood. The body needs lipids in small amounts. Dyslipidemia often involves a high level of cholesterol or triglycerides, which are types of lipids.  Treatment depends on the type of dyslipidemia that you have and your other risk factors for heart disease and stroke.  For many people, treatment starts with lifestyle changes, such as diet and exercise. Your health care provider may also prescribe medicine to lower lipids. This information is not intended to replace advice given to you by your health care provider. Make sure you discuss any questions you have with your health care provider. Document Released: 03/15/2013 Document Revised: 11/05/2015 Document Reviewed: 11/05/2015 Elsevier Interactive Patient Education  2017 [employer]  Fat and Cholesterol Restricted Diet High levels of fat and  cholesterol in your blood may lead to various health problems, such as diseases of the heart, blood vessels, gallbladder, liver, and pancreas. Fats are concentrated sources of energy that come in various forms. Certain types of fat, including saturated fat, may be harmful in excess. Cholesterol is a substance needed by your body in small amounts. Your body makes all the cholesterol it needs. Excess cholesterol  comes from the food you eat. When you have high levels of cholesterol and saturated fat in your blood, health problems can develop because the excess fat and cholesterol will gather along the walls of your blood vessels, causing them to narrow. Choosing the right foods will help you control your intake of fat and cholesterol. This will help keep the levels of these substances in your blood within normal limits and reduce your risk of disease. What is my plan? Your health care provider recommends that you:  Limit your fat intake to ______% or less of your total calories per day.  Limit the amount of cholesterol in your diet to less than _________mg per day.  Eat 20-30 grams of fiber each day. What types of fat should I choose?  Choose healthy fats more often. Choose monounsaturated and polyunsaturated fats, such as olive and canola oil, flaxseeds, walnuts, almonds, and seeds.  Eat more omega-3 fats. Good choices include salmon, mackerel, sardines, tuna, flaxseed oil, and ground flaxseeds. Aim to eat fish at least two times a week.  Limit saturated fats. Saturated fats are primarily found in animal products, such as meats, butter, and cream. Plant sources of saturated fats include palm oil, palm kernel oil, and coconut oil.  Avoid foods with partially hydrogenated oils in them. These contain trans fats. Examples of foods that contain trans fats are stick margarine, some tub margarines, cookies, crackers, and other baked goods. What general guidelines do I need to follow? These guidelines for healthy eating will help you control your intake of fat and cholesterol:  Check food labels carefully to identify foods with trans fats or high amounts of saturated fat.  Fill one half of your plate with vegetables and green salads.  Fill one fourth of your plate with whole grains. Look for the word "whole" as the first word in the ingredient list.  Fill one fourth of your plate with lean protein  foods.  Limit fruit to two servings a day. Choose fruit instead of juice.  Eat more foods that contain fiber, such as apples, broccoli, carrots, beans, peas, and barley.  Eat more home-cooked food and less restaurant, buffet, and fast food.  Limit or avoid alcohol.  Limit foods high in starch and sugar.  Limit fried foods.  Cook foods using methods other than frying. Baking, boiling, grilling, and broiling are all great options.  Lose weight if you are overweight. Losing just 5-10% of your initial body weight can help your overall health and prevent diseases such as diabetes and heart disease. What foods can I eat? Grains   Whole grains, such as whole wheat or whole grain breads, crackers, cereals, and pasta. Unsweetened oatmeal, bulgur, barley, quinoa, or brown rice. Corn or whole wheat flour tortillas. Vegetables   Fresh or frozen vegetables (raw, steamed, roasted, or grilled). Green salads. Fruits   All fresh, canned (in natural juice), or frozen fruits. Meats and other protein foods   Ground beef (85% or leaner), grass-fed beef, or beef trimmed of fat. Skinless chicken or [place]. Ground chicken or [place]. Pork trimmed of fat. All fish and  seafood. Eggs. Dried beans, peas, or lentils. Unsalted nuts or seeds. Unsalted canned or dry beans. Dairy   Low-fat dairy products, such as skim or 1% milk, 2% or reduced-fat cheeses, low-fat ricotta or cottage cheese, or plain low-fat yo Fats and oils   Tub margarines without trans fats. Light or reduced-fat mayonnaise and salad dressings. Avocado. Olive, canola, sesame, or safflower oils. Natural peanut or almond butter (choose ones without added sugar and oil). The items listed above may not be a complete list of recommended foods or beverages. Contact your dietitian for more options.  Foods to avoid Grains   White bread. White pasta. White rice. Cornbread. Bagels, pastries, and croissants. Crackers that contain trans fat. Vegetables    White potatoes. Corn. Creamed or fried vegetables. Vegetables in a cheese sauce. Fruits   Dried fruits. Canned fruit in light or heavy syrup. Fruit juice. Meats and other protein foods   Fatty cuts of meat. Ribs, chicken wings, bacon, sausage, bologna, salami, chitterlings, fatback, hot dogs, bratwurst, and packaged luncheon meats. Liver and organ meats. Dairy   Whole or 2% milk, cream, half-and-half, and cream cheese. Whole milk cheeses. Whole-fat or sweetened yogurt. Full-fat cheeses. Nondairy creamers and whipped toppings. Processed cheese, cheese spreads, or cheese curds. Beverages   Alcohol. Sweetened drinks (such as sodas, lemonade, and fruit drinks or punches). Fats and oils   Butter, stick margarine, lard, shortening, ghee, or bacon fat. Coconut, palm kernel, or palm oils. Sweets and desserts   Corn syrup, sugars, honey, and molasses. Candy. Jam and jelly. Syrup. Sweetened cereals. Cookies, pies, cakes, donuts, muffins, and ice cream. The items listed above may not be a complete list of foods and beverages to avoid. Contact your dietitian for more information.  This information is not intended to replace advice given to you by your health care provider. Make sure you discuss any questions you have with your health care provider. Document Released: 03/10/2005 Document Revised: 03/31/2014 Document Reviewed: 06/08/2013 Elsevier Interactive Patient Education  2017 [place].  Please start fenofibrate 48 mg daily.  Please follow low cholesterol/fat dit. Increase regular movement (I.e. Walking and elliptical). Since you cannot tolerate statins, use lifestyle to reduce tot chol and LDL chol. Please fax [place] your recent liver function lab results. Please return in 6 weeks for fasting lab draw-nurse visit. Please call us with any questions/concerns.

## 2016-07-22 NOTE — Progress Notes (Signed)
Discussed results with pt.  Advised to increase water and continue Losartan daily.  She has f/u with Endocrinology at end of May.

## 2016-07-22 NOTE — Progress Notes (Signed)
Subjective:    Patient ID: Beverly Schwartz, female    DOB: [birth year], 46 y.o.   MRN: [id number]  HPI:  Ms. Basden is here for f/u on labs-specifically elevated Tot chol 202, TG 240, LDL 114. She refuses to take statins (family and personal hx of severe muscle cramps).  She has lost >11 lbs since Jan 2018 due to "[place]".  She plans on beginning a low-impact exercise routine this week (walking and home elliptical machine).  She recently had hepatic US and LFTs-she will fax use results ASAP. She in interested in pure Tetanus vaccination, however does not want Tdap-clinic does not carry Tetanus only immunizations.   She reports am BS running < 200s.  Patient Care Team    Relationship Specialty Notifications Start End  Odella Aquas, NP PCP - General Family Medicine  07/07/16   Delrae Rend, MD Consulting Physician Endocrinology  07/07/16     Patient Active Problem List   Diagnosis Date Noted  . Diabetes mellitus without complication ([place]) 66/44/0347  . Hypothyroid 07/07/2016  . Hypertension 07/07/2016  . Hyperlipidemia 08/06/2010  . Tachycardia   . Chest pain   . Dizziness   . Obesity      Past Medical History:  Diagnosis Date  . Anxiety   . Chest pain   . Depression   . Diabetes mellitus   . Dizziness   . Hyperlipidemia   . Hypertension   . Obesity   . Tachycardia      Past Surgical History:  Procedure Laterality Date  . WISDOM TOOTH EXTRACTION     age 37's     Family History  Problem Relation Age of Onset  . Heart disease Father   . Diabetes Father   . Healthy Mother   . Hyperlipidemia Sister   . Healthy Brother   . Heart disease Paternal Uncle   . Heart attack Paternal Uncle   . Healthy Son      History  Drug Use No     History  Alcohol Use No     History  Smoking Status  . Never Smoker  Smokeless Tobacco  . Never Used     Outpatient Encounter Prescriptions as of 07/22/2016  Medication Sig  . ALPRAZolam (NIRAVAM) 0.5 MG dissolvable  tablet Take 1 tablet (0.5 mg total) by mouth 2 (two) times daily as needed for anxiety.  . fenofibrate (TRICOR) 48 MG tablet Take 1 tablet (48 mg total) by mouth daily.  . insulin NPH-regular Human (NOVOLIN 70/30) (70-30) 100 UNIT/ML injection Inject 25 Units into the skin 2 (two) times daily with a meal. Use 25 units in the morning and 15 units in the evening   . levothyroxine (SYNTHROID, LEVOTHROID) 112 MCG tablet Take 112 mcg by mouth daily before breakfast.  . losartan (COZAAR) 50 MG tablet Take 50 mg by mouth daily.   No facility-administered encounter medications on file as of 07/22/2016.     Allergies: Amoxicillin  Body mass index is 31.13 kg/m.  Blood pressure (!) 153/85, pulse 95, height 5\' 5"  (1.651 m), weight 187 lb 1.3 oz (84.9 kg), SpO2 100 %.     Review of Systems  Constitutional: Negative for activity change, appetite change, chills, diaphoresis, fatigue, fever and unexpected weight change.  Respiratory: Negative for cough, chest tightness, shortness of breath, wheezing and stridor.   Cardiovascular: Negative for chest pain and leg swelling.  Endocrine: Negative for cold intolerance, heat intolerance, polydipsia, polyphagia and polyuria.  Hematological: Does not bruise/bleed easily.  Objective:   Physical Exam  Constitutional: She is oriented to person, place, and time. She appears well-developed and well-nourished. No distress.  HENT:  Head: Normocephalic and atraumatic.  Eyes: Conjunctivae are normal. Pupils are equal, round, and reactive to light.  Cardiovascular: Normal rate, regular rhythm, normal heart sounds and intact distal pulses.   No murmur heard. Pulmonary/Chest: Effort normal and breath sounds normal. No respiratory distress. She has no wheezes. She has no rales. She exhibits no tenderness.  Neurological: She is alert and oriented to person, place, and time.  Skin: Skin is warm and dry. No rash noted. She is not diaphoretic. There is erythema. No  pallor.  Erythema and flaky skin noted on face and neck.  Psychiatric: She has a normal mood and affect. Her behavior is normal. Judgment and thought content normal.  Nursing note and vitals reviewed.         Assessment & Plan:   1. Diabetes mellitus without complication ([place])   2. Hyperlipidemia associated with type 2 diabetes mellitus ([place])   3. Essential hypertension   4. Other hyperlipidemia     Hyperlipidemia Please start fenofibrate 48 mg daily.  Please follow low cholesterol/fat dit. Increase regular movement (I.e. Walking and elliptical). She refuses statin therapy. Please fax [place] your recent liver function lab results. Please return in 6 weeks for fasting lab draw-nurse visit. Please call us with any questions/concerns.  Hypertension Continue Losartan 50mg  PO daily    FOLLOW-UP:  Return in about 6 weeks (around 09/02/2016) for Lab Work.

## 2016-07-23 ENCOUNTER — Telehealth: Payer: Self-pay

## 2016-07-23 ENCOUNTER — Other Ambulatory Visit: Payer: Self-pay

## 2016-07-23 ENCOUNTER — Telehealth: Payer: Self-pay | Admitting: Adult Health

## 2016-07-23 ENCOUNTER — Other Ambulatory Visit: Payer: Self-pay | Admitting: Adult Health

## 2016-07-23 DIAGNOSIS — I10 Essential (primary) hypertension: Secondary | ICD-10-CM

## 2016-07-23 DIAGNOSIS — E119 Type 2 diabetes mellitus without complications: Secondary | ICD-10-CM

## 2016-07-23 MED ORDER — LISINOPRIL 10 MG PO TABS: 10.0000 mg | ORAL_TABLET | Freq: Every day | ORAL | 3 refills | Status: DC

## 2016-07-23 NOTE — Telephone Encounter (Signed)
Pt informed. Pt advised to stop Losartan. Pt expressed understanding and is agreeable. Bobetta Lime CMA, RT

## 2016-07-23 NOTE — Telephone Encounter (Signed)
Called pt and sent a telephone note to [place].

## 2016-07-23 NOTE — Telephone Encounter (Signed)
Pt called request Katy or nurse call her as soon as possible would not state what issue/ problem was--- ph# [phone]. --glh

## 2016-07-23 NOTE — Telephone Encounter (Signed)
Per Lillard Anes, pt's PCP, no need to call pt because she saw the patient yesterday and the patient will be following up with our practice for future health maintenance.  Charyl Bigger, CMA

## 2016-07-23 NOTE — Telephone Encounter (Signed)
Please update pt's chart / care team with the providers she sees for her care including who her GYN doc is.  thnx

## 2016-07-23 NOTE — Telephone Encounter (Signed)
Phone call to pt to f/u on pap results from [facility] from 07/24/15.  Results showed positive for HPV.  Pt states that she was diagnosed with this in college and she follows up on this reguarly with GYN.  Charyl Bigger, CMA

## 2016-07-23 NOTE — Telephone Encounter (Signed)
Afternoon Estill Bamberg, Can you please call Ms. Para and share:  Losartan can cause nosebleeds, especially when someone start taking the medication. I have d/cd Losartan and started her on Lisinopril 10mg  daily. Please stop the Losartan and start the Lisinopril.  Please call us with any other questions/concerns.  Thanks! Valetta Fuller

## 2016-07-23 NOTE — Telephone Encounter (Addendum)
Pt called and stated that she has had 2 nose bleeds back to back today. Pt states she does have seasonal allergies but she has never had a nose bleed before. Nose bleed has stopped but starts back if she bends her head down. Pt stated she took her BP and it was 142/82. Pt stated the only thing she has done differently is taking Losartan. Pt wants to know what she should do? Please advise?

## 2016-08-07 ENCOUNTER — Encounter: Payer: Self-pay | Admitting: Adult Health

## 2016-08-14 NOTE — Progress Notes (Signed)
Opened in error. T. Nelson, CMA 

## 2016-08-28 ENCOUNTER — Telehealth: Payer: Self-pay | Admitting: Adult Health

## 2016-08-28 NOTE — Telephone Encounter (Signed)
Spoke with pt at length, she reported the following events that occurred at 1100 this morning: She was seated in chair preparing to get in the shower and had syncopal event. She awoke (she cannot recall amount of time that passed, could be seconds/minutes) and felt that she "may have had a seizure".  Her BS sugar was stable > 180 and she denies hx of cardiac/neurological disorders. She reports feeling fine this afternoon, however is concerned-understandably so. She reports "passing out before, but never like this". Due to nature of incident I advised her to be seen at local UC/ED-for examination, lab work, and imaging (CT/MRI). She stated "I don't want to pay the co-pay to go to ER".  I offered to see her in our clinic this afternoon and she declined bc her "son really needs a haircut".   I offered to schedule her an acute appt tomorrow morning at 1015, she declined bc her son has awards day tomorrow. I stressed the importance of her seeking medical care and she again declined to be seen today in clinic or at local UC/ED.

## 2016-08-28 NOTE — Telephone Encounter (Signed)
Pt called requested Beverly Schwartz call her , she had an episode this morning would not relay any further details. --glh

## 2016-08-28 NOTE — Telephone Encounter (Signed)
Call transferred to Mina Marble, NP to discuss with pt.  Charyl Bigger, CMA

## 2016-09-03 ENCOUNTER — Encounter: Payer: Self-pay | Admitting: Adult Health

## 2016-09-03 ENCOUNTER — Ambulatory Visit (INDEPENDENT_AMBULATORY_CARE_PROVIDER_SITE_OTHER): Payer: BLUE CROSS/BLUE SHIELD | Admitting: Adult Health

## 2016-09-03 DIAGNOSIS — J0101 Acute recurrent maxillary sinusitis: Secondary | ICD-10-CM | POA: Diagnosis not present

## 2016-09-03 DIAGNOSIS — E119 Type 2 diabetes mellitus without complications: Secondary | ICD-10-CM

## 2016-09-03 DIAGNOSIS — I10 Essential (primary) hypertension: Secondary | ICD-10-CM | POA: Diagnosis not present

## 2016-09-03 DIAGNOSIS — J329 Chronic sinusitis, unspecified: Secondary | ICD-10-CM | POA: Insufficient documentation

## 2016-09-03 DIAGNOSIS — E7849 Other hyperlipidemia: Secondary | ICD-10-CM

## 2016-09-03 DIAGNOSIS — E784 Other hyperlipidemia: Secondary | ICD-10-CM

## 2016-09-03 DIAGNOSIS — R42 Dizziness and giddiness: Secondary | ICD-10-CM

## 2016-09-03 MED ORDER — FLUTICASONE PROPIONATE 50 MCG/ACT NA SUSP: 1.0000 | Freq: Every day | NASAL | 2 refills | Status: DC

## 2016-09-03 MED ORDER — DOXYCYCLINE HYCLATE 100 MG PO TABS: 100.0000 mg | ORAL_TABLET | Freq: Two times a day (BID) | ORAL | 0 refills | Status: DC

## 2016-09-03 NOTE — Assessment & Plan Note (Signed)
Doxcycline and flonase as directed. Increase fluids/rest. If sx's persist after ABX then RTC.

## 2016-09-03 NOTE — Progress Notes (Addendum)
Subjective:    Patient ID: Beverly Schwartz, female    DOB: Feb 01, 1971, 46 y.o.   MRN: [id number]  HPI:  Beverly Schwartz is here for nasal drainage (constant, thick yellow/clear), "raw throat", facial pressure, and clogged L ear that has been going for weeks and is not improving.  She has OTC nasal spray, however she has not used it.  She denies CP/dyspnea/palpitations.  Of Note: Last week she called clinic about syncopal event with possible "seisure activity"-I advised her to be seen at nearest ED and she refused.  I advised her to be seen in our clinic and she refused bc "son needed a haircut and has awards day at school".  She denies any other syncopal/siezure type sx's, since last week.  She denies hx of epilepsy, however thinks she "might have vertigo".  Of Note: She is very confusing historian and has hx of non-compliance.  She reports seeing her Endo, "maybe every few months".  She has hx of elevated LFTs and Lipid panel, Abdominal US 06/09/16 " IMPRESSION: Multiple hepatic cysts. 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  Fatty infiltration of the liver.   No evidence of cholelithiasis."  We ran labs in April 2018 that showed continued elevations in Lipids and hepatic function-however she declines statin therapy, and did not f/u in mid May as instructed.  Due to Diabetes, HTN, HL-she is at increased risk of CVA/MI.  She has family hx of exceedingly high TGs (sisters > 1200).      Patient Care Team    Relationship Specialty Notifications Start End  Beverly Marble D, NP PCP - General Family Medicine  07/07/16   Beverly Rend, MD Consulting Physician Endocrinology  07/07/16     Patient Active Problem List   Diagnosis Date Noted  . Sinusitis 09/03/2016  . Diabetes mellitus without complication ([place]) 94/70/9628  . Hypothyroid 07/07/2016  . Hypertension 07/07/2016  . Hyperlipidemia 08/06/2010  .  Tachycardia   . Chest pain   . Dizziness   . Obesity      Past Medical History:  Diagnosis Date  . Anxiety   . Chest pain   . Depression   . Diabetes mellitus   . Dizziness   . Hyperlipidemia   . Hypertension   . Obesity   . Tachycardia      Past Surgical History:  Procedure Laterality Date  . WISDOM TOOTH EXTRACTION     age 22's     Family History  Problem Relation Age of Onset  . Heart disease Father   . Diabetes Father   . Healthy Mother   . Hyperlipidemia Sister   . Healthy Brother   . Heart disease Paternal Uncle   . Heart attack Paternal Uncle   . Healthy Son      History  Drug Use No     History  Alcohol Use No     History  Smoking Status  . Never Smoker  Smokeless Tobacco  . Never Used     Outpatient Encounter Prescriptions as of 09/03/2016  Medication Sig  . insulin NPH-regular Human (NOVOLIN 70/30) (70-30) 100 UNIT/ML injection Inject 25 Units into the skin 2 (two) times daily with a meal. Use 25 units in the morning and 15 units in the evening   . levothyroxine (SYNTHROID, LEVOTHROID) 112 MCG tablet Take 112 mcg by mouth daily before breakfast.  . losartan (COZAAR) 50 MG tablet Take 50  mg by mouth daily.  . Omega-3 Fatty Acids (FISH OIL) 1000 MG CAPS Take by mouth daily.  Marland Kitchen ALPRAZolam (NIRAVAM) 0.5 MG dissolvable tablet Take 1 tablet (0.5 mg total) by mouth 2 (two) times daily as needed for anxiety. (Patient not taking: Reported on 09/03/2016)  . doxycycline (VIBRA-TABS) 100 MG tablet Take 1 tablet (100 mg total) by mouth 2 (two) times daily.  . fluticasone (FLONASE) 50 MCG/ACT nasal spray Place 1 spray into both nostrils daily.  . [DISCONTINUED] fenofibrate (TRICOR) 48 MG tablet Take 1 tablet (48 mg total) by mouth daily.  . [DISCONTINUED] lisinopril (PRINIVIL,ZESTRIL) 10 MG tablet Take 1 tablet (10 mg total) by mouth daily.   No facility-administered encounter medications on file as of 09/03/2016.     Allergies: Amoxicillin  Body  mass index is 31.1 kg/m.  Blood pressure 138/81, pulse 98, height 5\' 5"  (1.651 m), weight 186 lb 14.4 oz (84.8 kg).    Review of Systems  Constitutional: Positive for fatigue. Negative for activity change, appetite change, chills, diaphoresis, fever and unexpected weight change.  HENT: Positive for congestion, postnasal drip, rhinorrhea, sinus pressure and sore throat. Negative for voice change.   Eyes: Negative for visual disturbance.  Respiratory: Negative for cough, chest tightness, shortness of breath, wheezing and stridor.   Cardiovascular: Negative for chest pain, palpitations and leg swelling.  Gastrointestinal: Negative for abdominal distention, abdominal pain, diarrhea, nausea and vomiting.  Endocrine: Negative for cold intolerance, heat intolerance, polydipsia, polyphagia and polyuria.  Genitourinary: Negative for difficulty urinating and flank pain.  Skin: Negative for color change, pallor, rash and wound.  Neurological: Negative for dizziness, tremors, seizures, weakness and headaches.  Hematological: Does not bruise/bleed easily.  Psychiatric/Behavioral: Negative for sleep disturbance.       Objective:   Physical Exam  Constitutional: She appears well-developed and well-nourished. No distress.  HENT:  Head: Normocephalic and atraumatic.  Right Ear: External ear and ear canal normal. Tympanic membrane is bulging. Tympanic membrane is not erythematous.  Left Ear: Hearing and external ear normal. Tympanic membrane is erythematous and bulging. No decreased hearing is noted.  Nose: Right sinus exhibits maxillary sinus tenderness. Right sinus exhibits no frontal sinus tenderness. Left sinus exhibits maxillary sinus tenderness. Left sinus exhibits no frontal sinus tenderness.  Mouth/Throat: Uvula is midline.  Ear lavage performed-cerumen removed from L ear canal.   Eyes: Conjunctivae are normal. Pupils are equal, round, and reactive to light.  Cardiovascular: Normal rate,  regular rhythm, normal heart sounds and intact distal pulses.   No murmur heard. Pulmonary/Chest: Effort normal and breath sounds normal. No respiratory distress. She has no wheezes. She has no rales. She exhibits no tenderness.  Abdominal: Soft. Bowel sounds are normal. She exhibits no distension and no mass. There is no tenderness. There is no rebound and no guarding.  Musculoskeletal: Normal range of motion.  Skin: Skin is warm and dry. No rash noted. She is not diaphoretic. No erythema. No pallor.  Psychiatric: She has a normal mood and affect. Her behavior is normal. Judgment and thought content normal.  Nursing note and vitals reviewed.         Assessment & Plan:   1. Diabetes mellitus without complication ([place])   2. Essential hypertension   3. Dizziness   4. Other hyperlipidemia   5. Acute recurrent maxillary sinusitis     Diabetes mellitus without complication ([facility]) Reports am BS < 200s. Compliant on Novolin70/30 25 U BID, denies hypoglycemia episodes. She hs Endo appt next month. Last  A1c 7.9on 07/07/16    Hypertension Continue losartan 50mg  daily.  Dizziness None at OV today.  Hyperlipidemia Discussed at length that she needs to normalize chol levels. She vehemently refuses statin therapy or referral to GI. TLC is imperative to reduce TGs and LDL. She has not returned for 07/22/16 labs- Lipid and Hepatic panel-she has eaten today, therefore we cannot complete today. Instructed to make lab appt when checking out today.  Sinusitis Doxcycline and flonase as directed. Increase fluids/rest. If sx's persist after ABX then RTC.    FOLLOW-UP:  Return if symptoms worsen or fail to improve.

## 2016-09-03 NOTE — Assessment & Plan Note (Signed)
Continue losartan 50 mg daily.  

## 2016-09-03 NOTE — Assessment & Plan Note (Signed)
Reports am BS < 200s. Compliant on Novolin70/30 25 U BID, denies hypoglycemia episodes. She hs Endo appt next month. Last A1c 7.9on 07/07/16

## 2016-09-03 NOTE — Assessment & Plan Note (Signed)
None at OV today.

## 2016-09-03 NOTE — Assessment & Plan Note (Signed)
Discussed at length that she needs to normalize chol levels. She vehemently refuses statin therapy or referral to GI. TLC is imperative to reduce TGs and LDL. She has not returned for 07/22/16 labs- Lipid and Hepatic panel-she has eaten today, therefore we cannot complete today. Instructed to make lab appt when checking out today.

## 2016-09-03 NOTE — Patient Instructions (Addendum)

## 2016-09-08 ENCOUNTER — Other Ambulatory Visit: Payer: Self-pay

## 2016-09-11 ENCOUNTER — Other Ambulatory Visit (INDEPENDENT_AMBULATORY_CARE_PROVIDER_SITE_OTHER): Payer: BLUE CROSS/BLUE SHIELD

## 2016-09-11 DIAGNOSIS — E785 Hyperlipidemia, unspecified: Secondary | ICD-10-CM

## 2016-09-11 DIAGNOSIS — E1169 Type 2 diabetes mellitus with other specified complication: Secondary | ICD-10-CM

## 2016-09-12 LAB — HEPATIC FUNCTION PANEL
ALT: 16 IU/L (ref 0–32)
AST: 17 IU/L (ref 0–40)
Albumin: 4.2 g/dL (ref 3.5–5.5)
Alkaline Phosphatase: 171 IU/L — ABNORMAL HIGH (ref 39–117)
Bilirubin Total: 0.3 mg/dL (ref 0.0–1.2)
Bilirubin, Direct: 0.09 mg/dL (ref 0.00–0.40)
Total Protein: 7 g/dL (ref 6.0–8.5)

## 2016-09-12 LAB — LIPID PANEL
Chol/HDL Ratio: 5.6 ratio — ABNORMAL HIGH (ref 0.0–4.4)
Cholesterol, Total: 222 mg/dL — ABNORMAL HIGH (ref 100–199)
HDL: 40 mg/dL (ref >39)
LDL Calculated: 117 mg/dL — ABNORMAL HIGH (ref 0–99)
Triglycerides: 326 mg/dL — ABNORMAL HIGH (ref 0–149)
VLDL Cholesterol Cal: 65 mg/dL — ABNORMAL HIGH (ref 5–40)

## 2016-09-15 ENCOUNTER — Other Ambulatory Visit: Payer: Self-pay | Admitting: Adult Health

## 2016-09-15 DIAGNOSIS — E7849 Other hyperlipidemia: Secondary | ICD-10-CM

## 2016-09-15 MED ORDER — ATORVASTATIN CALCIUM 10 MG PO TABS: 10.0000 mg | ORAL_TABLET | Freq: Every day | ORAL | 3 refills | Status: DC

## 2016-09-17 ENCOUNTER — Telehealth: Payer: Self-pay

## 2016-09-17 DIAGNOSIS — I10 Essential (primary) hypertension: Secondary | ICD-10-CM

## 2016-09-17 DIAGNOSIS — E7849 Other hyperlipidemia: Secondary | ICD-10-CM

## 2016-09-17 NOTE — Telephone Encounter (Signed)
Left message for patient to call office to discuss lab results and recommendations. 

## 2016-09-17 NOTE — Telephone Encounter (Signed)
-----  Message from Esaw Grandchild, NP sent at 09/15/2016  4:55 PM EDT ----- Please call Ms. Amendola and share that her labs showed: Tot chol 222, TG 326, HDL 40, LDL 117 These levels increase ASCVD risk to 4.4%, optimal is 0.3% She needs to follow heart healthy diet and exercise on regular basis, recommend 22mns/5 times a week. She needs moderate intensity statin, I sent in Atorvastatin 191m  Please increase water intake to at least 90 ounces a day (1/2 body weight in ounces). We will need to re-check lipids in 4 months. Her ALT/AST were normal, she has elevation in Alk. Phosphatase 171.  She has hx of  "Multiple hepatic cysts and fatty infiltration of the live"-CT scan March 2018. Again it is imperative that she reduce fat in diet and increase movement. We will continue to monitor and check liver function 6 weeks.  Thanks! KaValetta Fuller

## 2016-09-22 ENCOUNTER — Telehealth: Payer: Self-pay

## 2016-09-22 NOTE — Telephone Encounter (Signed)
Informed patient of lab results and recommendations.  Follow up lab appointment made but patient would not schedule other follow up.

## 2016-09-30 ENCOUNTER — Encounter: Payer: Self-pay | Admitting: Adult Health

## 2016-09-30 ENCOUNTER — Ambulatory Visit (INDEPENDENT_AMBULATORY_CARE_PROVIDER_SITE_OTHER): Payer: BLUE CROSS/BLUE SHIELD | Admitting: Adult Health

## 2016-09-30 ENCOUNTER — Telehealth: Payer: Self-pay | Admitting: Adult Health

## 2016-09-30 ENCOUNTER — Telehealth: Payer: Self-pay

## 2016-09-30 VITALS — BP 127/79 | HR 98 | Ht 65.0 in | Wt 182.0 lb

## 2016-09-30 DIAGNOSIS — I10 Essential (primary) hypertension: Secondary | ICD-10-CM | POA: Diagnosis not present

## 2016-09-30 DIAGNOSIS — E784 Other hyperlipidemia: Secondary | ICD-10-CM | POA: Diagnosis not present

## 2016-09-30 DIAGNOSIS — F419 Anxiety disorder, unspecified: Secondary | ICD-10-CM

## 2016-09-30 DIAGNOSIS — E7849 Other hyperlipidemia: Secondary | ICD-10-CM

## 2016-09-30 DIAGNOSIS — E119 Type 2 diabetes mellitus without complications: Secondary | ICD-10-CM | POA: Diagnosis not present

## 2016-09-30 DIAGNOSIS — R1084 Generalized abdominal pain: Secondary | ICD-10-CM | POA: Diagnosis not present

## 2016-09-30 LAB — POCT GLYCOSYLATED HEMOGLOBIN (HGB A1C): Hemoglobin A1C: 8.4

## 2016-09-30 MED ORDER — ALPRAZOLAM 1 MG PO TABS: 1.0000 mg | ORAL_TABLET | Freq: Every day | ORAL | 0 refills | Status: DC | PRN

## 2016-09-30 NOTE — Assessment & Plan Note (Signed)
[place] Controlled Subst Database reviewed, no contraindications to RF Alprazolam Rx. Discussed benefits of CBT and advised her verify with her insurance which mental health providers are in her network.

## 2016-09-30 NOTE — Assessment & Plan Note (Signed)
CT scan March 2018: "Multiple hepatic cysts and fatty infiltration of the live" Intermittent LLQ abdominal pain since Sunday. Gastroenterology referral placed. Continue to abstain from ETOH and reduce saturated fat intake.

## 2016-09-30 NOTE — Assessment & Plan Note (Addendum)
Started on Atorvastatin 10mg  June 2018. Continue to abstain from ETOH and reduce saturated fat intake. Will re-check hepatic function in 4 weeks.

## 2016-09-30 NOTE — Assessment & Plan Note (Signed)
A1c today 8.4, increased from 7.9 in April 2018 She cannot articulate when her last OV with Endo. Continue Novolin 70/30 25 U BID, PLEASE schedule appt with established Endo.

## 2016-09-30 NOTE — Assessment & Plan Note (Signed)
BP at goal 127/79 Continue Losartan 50mg  daily

## 2016-09-30 NOTE — Telephone Encounter (Signed)
Spoke with patient regarding her abdominal pain and xanax.  Appointment made to discuss with [facility].

## 2016-09-30 NOTE — Progress Notes (Addendum)
Subjective:    Patient ID: Beverly Schwartz, female    DOB: 1971-02-19, 46 y.o.   MRN: [id number]  HPI:  Beverly Schwartz presents LLQ abdominal pain that began 3 days ago and has been intermittent since then.  Pain will develop 5- 29mins after eating, localized over LLQ, rated 7/10 and described as sharp.  Pain lasts for 30 seconds, then self resolves.  She reports 1-2 BMs daily and had normal BM this morning.  She denies blood in urine/stool.  She denies N/V/Schwartz/fever/night sweats/poor appetite.  I reviewed Abdominal US results from Spring 2018, all her recent labs-Gastro referral placed. She also requests RF on Alprazolam due to upcoming family trip and "it always causes my anxiety to jump up".   She denies EOTH/tobacco use.   She reports walking and WII workouts several days a week.  Patient Care Team    Relationship Specialty Notifications Start End  Beverly Marble D, NP PCP - General Family Medicine  07/07/16   Beverly Rend, MD Consulting Physician Endocrinology  07/07/16     Patient Active Problem List   Diagnosis Date Noted  . Generalized abdominal pain 09/30/2016  . Anxiety 09/30/2016  . Sinusitis 09/03/2016  . Diabetes mellitus without complication (Beverly Schwartz) 75/64/3329  . Hypothyroid 07/07/2016  . Hypertension 07/07/2016  . Hyperlipidemia 08/06/2010  . Tachycardia   . Chest pain   . Dizziness   . Obesity      Past Medical History:  Diagnosis Date  . Anxiety   . Chest pain   . Depression   . Diabetes mellitus   . Dizziness   . Hyperlipidemia   . Hypertension   . Obesity   . Tachycardia      Past Surgical History:  Procedure Laterality Date  . WISDOM TOOTH EXTRACTION     age 69's     Family History  Problem Relation Age of Onset  . Heart disease Father   . Diabetes Father   . Healthy Mother   . Hyperlipidemia Sister   . Healthy Brother   . Heart disease Paternal Uncle   . Heart attack Paternal Uncle   . Healthy Son      History  Drug Use No     History    Alcohol Use No     History  Smoking Status  . Never Smoker  Smokeless Tobacco  . Never Used     Outpatient Encounter Prescriptions as of 09/30/2016  Medication Sig  . atorvastatin (LIPITOR) 10 MG tablet Take 1 tablet (10 mg total) by mouth daily.  Marland Kitchen CINNAMON PO Take by mouth.  . fluticasone (FLONASE) 50 MCG/ACT nasal spray Place 1 spray into both nostrils daily.  . insulin NPH-regular Human (NOVOLIN 70/30) (70-30) 100 UNIT/ML injection Inject 25 Units into the skin 2 (two) times daily with a meal. Use 25 units in the morning and 15 units in the evening   . levothyroxine (SYNTHROID, LEVOTHROID) 112 MCG tablet Take 112 mcg by mouth daily before breakfast.  . losartan (COZAAR) 50 MG tablet Take 50 mg by mouth daily.  . Omega-3 Fatty Acids (FISH OIL) 1000 MG CAPS Take by mouth daily.  . TURMERIC PO Take by mouth.  . [DISCONTINUED] ALPRAZolam (XANAX) 0.25 MG tablet Take 0.25 mg by mouth as needed for anxiety (panic attack).  . [DISCONTINUED] doxycycline (VIBRA-TABS) 100 MG tablet Take 1 tablet (100 mg total) by mouth 2 (two) times daily.  Marland Kitchen ALPRAZolam (XANAX) 1 MG tablet Take 1 tablet (1 mg total) by mouth  daily as needed for anxiety.  . [DISCONTINUED] ALPRAZolam (NIRAVAM) 0.5 MG dissolvable tablet Take 1 tablet (0.5 mg total) by mouth 2 (two) times daily as needed for anxiety. (Patient not taking: Reported on 09/03/2016)   No facility-administered encounter medications on file as of 09/30/2016.     Allergies: Amoxicillin  Body mass index is 30.29 kg/m.  Blood pressure 127/79, pulse 98, height 5\' 5"  (1.651 m), weight 182 lb (82.6 kg).     Review of Systems  Constitutional: Positive for fatigue. Negative for activity change, appetite change, chills, diaphoresis, fever and unexpected weight change.  Respiratory: Negative for cough, chest tightness, shortness of breath, wheezing and stridor.   Cardiovascular: Negative for chest pain, palpitations and leg swelling.   Gastrointestinal: Positive for abdominal pain. Negative for abdominal distention, anal bleeding, blood in stool, constipation, diarrhea, nausea, rectal pain and vomiting.  Endocrine: Negative for cold intolerance, heat intolerance, polydipsia, polyphagia and polyuria.  Genitourinary: Negative for difficulty urinating, flank pain and hematuria.  Skin: Negative for color change, pallor, rash and wound.  Allergic/Immunologic: Negative for immunocompromised state.  Neurological: Negative for dizziness and headaches.  Hematological: Does not bruise/bleed easily.  Psychiatric/Behavioral: Negative for sleep disturbance. The patient is nervous/anxious.        Objective:   Physical Exam  Constitutional: She is oriented to person, place, and time. She appears well-developed and well-nourished. No distress.  HENT:  Head: Normocephalic and atraumatic.  Right Ear: External ear normal.  Left Ear: External ear normal.  Eyes: Conjunctivae are normal. Pupils are equal, round, and reactive to light.  Neck: Normal range of motion. Neck supple.  Cardiovascular: Normal rate, regular rhythm, normal heart sounds and intact distal pulses.   No murmur heard. Pulmonary/Chest: Effort normal and breath sounds normal. No respiratory distress. She has no wheezes. She has no rales. She exhibits no tenderness.  Abdominal: Soft. Bowel sounds are normal. She exhibits no distension and no mass. There is no hepatomegaly. There is tenderness in the left lower quadrant. There is no rigidity, no rebound, no guarding, no CVA tenderness, no tenderness at McBurney's point and negative Murphy's sign. No hernia.    VERY mild tenderness with palpation over LLQ that lasted 1 second, then completely stopped when pressure lifted.   Lymphadenopathy:    She has no cervical adenopathy.  Neurological: She is alert and oriented to person, place, and time. Coordination normal.  Skin: Skin is warm and dry. No rash noted. She is not  diaphoretic. No erythema. No pallor.  Psychiatric: She has a normal mood and affect. Her behavior is normal. Judgment and thought content normal.  Nursing note and vitals reviewed.         Assessment & Plan:   1. Generalized abdominal pain   2. Diabetes mellitus without complication ([place])   3. Essential hypertension   4. Other hyperlipidemia   5. Anxiety     Hypertension BP at goal 127/79 Continue Losartan 50mg  daily   Diabetes mellitus without complication ([facility]) I4P today 8.4, increased from 7.9 in April 2018 She cannot articulate when her last OV with Endo. Continue Novolin 70/30 25 U BID, PLEASE schedule appt with established Endo.  Generalized abdominal pain CT scan March 2018: "Multiple hepatic cysts and fatty infiltration of the live" Intermittent LLQ abdominal pain since Sunday. Gastroenterology referral placed. Continue to abstain from ETOH and reduce saturated fat intake.    Hyperlipidemia Started on Atorvastatin 10mg  June 2018. Continue to abstain from ETOH and reduce saturated fat intake. Will  re-check hepatic function in 4 weeks.  Anxiety [place] Controlled Subst Database reviewed, no contraindications to RF Alprazolam Rx. Discussed benefits of CBT and advised her verify with her insurance which mental health providers are in her network.     FOLLOW-UP:  Return if symptoms worsen or fail to improve.

## 2016-09-30 NOTE — Telephone Encounter (Signed)
Patient called stating that she received the dissolvable tabs of the alprazolam and is wanting to get the regular oral tabs sent to [facility] on [address]. She also is complaining of sharp stomach pains with constipation and wants to speak with someone clinical about it.

## 2016-09-30 NOTE — Telephone Encounter (Addendum)
Left message advising patient to call back about her xanax and that as far as the stomach pains she needs to go to urgent care or make an appointment.

## 2016-09-30 NOTE — Patient Instructions (Addendum)
Abdominal Pain, Adult Abdominal pain can be caused by many things. Often, abdominal pain is not serious and it gets better with no treatment or by being treated at home. However, sometimes abdominal pain is serious. Your health care provider will do a medical history and a physical exam to try to determine the cause of your abdominal pain. Follow these instructions at home:  Take over-the-counter and prescription medicines only as told by your health care provider. Do not take a laxative unless told by your health care provider.  Drink enough fluid to keep your urine clear or pale yellow.  Watch your condition for any changes.  Keep all follow-up visits as told by your health care provider. This is important. Contact a health care provider if:  Your abdominal pain changes or gets worse.  You are not hungry or you lose weight without trying.  You are constipated or have diarrhea for more than 2-3 days.  You have pain when you urinate or have a bowel movement.  Your abdominal pain wakes you up at night.  Your pain gets worse with meals, after eating, or with certain foods.  You are throwing up and cannot keep anything down.  You have a fever. Get help right away if:  Your pain does not go away as soon as your health care provider told you to expect.  You cannot stop throwing up.  Your pain is only in areas of the abdomen, such as the right side or the left lower portion of the abdomen.  You have bloody or black stools, or stools that look like tar.  You have severe pain, cramping, or bloating in your abdomen.  You have signs of dehydration, such as: ? Dark urine, very little urine, or no urine. ? Cracked lips. ? Dry mouth. ? Sunken eyes. ? Sleepiness. ? Weakness. This information is not intended to replace advice given to you by your health care provider. Make sure you discuss any questions you have with your health care provider. Document Released: 12/18/2004 Document  Revised: 09/28/2015 Document Reviewed: 08/22/2015 Elsevier Interactive Patient Education  2017 [place].     Generalized Anxiety Disorder, Adult  Generalized anxiety disorder (GAD) is a mental health disorder. People with this condition constantly worry about everyday events. Unlike normal anxiety, worry related to GAD is not triggered by a specific event. These worries also do not fade or get better with time. GAD interferes with life functions, including relationships, work, and school. GAD can vary from mild to severe. People with severe GAD can have intense waves of anxiety with physical symptoms (panic attacks). What are the causes? The exact cause of GAD is not known. What increases the risk? This condition is more likely to develop in:  Women.  People who have a family history of anxiety disorders.  People who are very shy.  People who experience very stressful life events, such as the death of a loved one.  People who have a very stressful family environment.  What are the signs or symptoms? People with GAD often worry excessively about many things in their lives, such as their health and family. They may also be overly concerned about:  Doing well at work.  Being on time.  Natural disasters.  Friendships.  Physical symptoms of GAD include:  Fatigue.  Muscle tension or having muscle twitches.  Trembling or feeling shaky.  Being easily startled.  Feeling like your heart is pounding or racing.  Feeling out of breath or like  you cannot take a deep breath.  Having trouble falling asleep or staying asleep.  Sweating.  Nausea, diarrhea, or irritable bowel syndrome (IBS).  Headaches.  Trouble concentrating or remembering facts.  Restlessness.  Irritability.  How is this diagnosed? Your health care provider can diagnose GAD based on your symptoms and medical history. You will also have a physical exam. The health care provider will ask specific  questions about your symptoms, including how severe they are, when they started, and if they come and go. Your health care provider may ask you about your use of alcohol or drugs, including prescription medicines. Your health care provider may refer you to a mental health specialist for further evaluation. Your health care provider will do a thorough examination and may perform additional tests to rule out other possible causes of your symptoms. To be diagnosed with GAD, a person must have anxiety that:  Is out of his or her control.  Affects several different aspects of his or her life, such as work and relationships.  Causes distress that makes him or her unable to take part in normal activities.  Includes at least three physical symptoms of GAD, such as restlessness, fatigue, trouble concentrating, irritability, muscle tension, or sleep problems.  Before your health care provider can confirm a diagnosis of GAD, these symptoms must be present more days than they are not, and they must last for six months or longer. How is this treated? The following therapies are usually used to treat GAD:  Medicine. Antidepressant medicine is usually prescribed for long-term daily control. Antianxiety medicines may be added in severe cases, especially when panic attacks occur.  Talk therapy (psychotherapy). Certain types of talk therapy can be helpful in treating GAD by providing support, education, and guidance. Options include: ? Cognitive behavioral therapy (CBT). People learn coping skills and techniques to ease their anxiety. They learn to identify unrealistic or negative thoughts and behaviors and to replace them with positive ones. ? Acceptance and commitment therapy (ACT). This treatment teaches people how to be mindful as a way to cope with unwanted thoughts and feelings. ? Biofeedback. This process trains you to manage your body's response (physiological response) through breathing techniques and  relaxation methods. You will work with a therapist while machines are used to monitor your physical symptoms.  Stress management techniques. These include yoga, meditation, and exercise.  A mental health specialist can help determine which treatment is best for you. Some people see improvement with one type of therapy. However, other people require a combination of therapies. Follow these instructions at home:  Take over-the-counter and prescription medicines only as told by your health care provider.  Try to maintain a normal routine.  Try to anticipate stressful situations and allow extra time to manage them.  Practice any stress management or self-calming techniques as taught by your health care provider.  Do not punish yourself for setbacks or for not making progress.  Try to recognize your accomplishments, even if they are small.  Keep all follow-up visits as told by your health care provider. This is important. Contact a health care provider if:  Your symptoms do not get better.  Your symptoms get worse.  You have signs of depression, such as: ? A persistently sad, cranky, or irritable mood. ? Loss of enjoyment in activities that used to bring you joy. ? Change in weight or eating. ? Changes in sleeping habits. ? Avoiding friends or family members. ? Loss of energy for normal tasks. ?  Feelings of guilt or worthlessness. Get help right away if:  You have serious thoughts about hurting yourself or others. If you ever feel like you may hurt yourself or others, or have thoughts about taking your own life, get help right away. You can go to your nearest emergency department or call:  Your local emergency services (911 in the [place]  A suicide crisis helpline, such as the [place] at (276)427-1105. This is open 24 hours a day.  Summary  Generalized anxiety disorder (GAD) is a mental health disorder that involves worry that is not triggered by  a specific event.  People with GAD often worry excessively about many things in their lives, such as their health and family.  GAD may cause physical symptoms such as restlessness, trouble concentrating, sleep problems, frequent sweating, nausea, diarrhea, headaches, and trembling or muscle twitching.  A mental health specialist can help determine which treatment is best for you. Some people see improvement with one type of therapy. However, other people require a combination of therapies. This information is not intended to replace advice given to you by your health care provider. Make sure you discuss any questions you have with your health care provider. Document Released: 07/05/2012 Document Revised: 01/29/2016 Document Reviewed: 01/29/2016 Elsevier Interactive Patient Education  2018 [place]  After being diagnosed with an anxiety disorder, you may be relieved to know why you have felt or behaved a certain way. It is natural to also feel overwhelmed about the treatment ahead and what it will mean for your life. With care and support, you can manage this condition and recover from it. How to cope with anxiety Dealing with stress Stress is your body's reaction to life changes and events, both good and bad. Stress can last just a few hours or it can be ongoing. Stress can play a major role in anxiety, so it is important to learn both how to cope with stress and how to think about it differently. Talk with your health care provider or a counselor to learn more about stress reduction. He or she may suggest some stress reduction techniques, such as:  Music therapy. This can include creating or listening to music that you enjoy and that inspires you.  Mindfulness-based meditation. This involves being aware of your normal breaths, rather than trying to control your breathing. It can be done while sitting or walking.  Centering prayer. This is a kind of  meditation that involves focusing on a word, phrase, or sacred image that is meaningful to you and that brings you peace.  Deep breathing. To do this, expand your stomach and inhale slowly through your nose. Hold your breath for 3-5 seconds. Then exhale slowly, allowing your stomach muscles to relax.  Self-talk. This is a skill where you identify thought patterns that lead to anxiety reactions and correct those thoughts.  Muscle relaxation. This involves tensing muscles then relaxing them.  Choose a stress reduction technique that fits your lifestyle and personality. Stress reduction techniques take time and practice. Set aside 5-15 minutes a day to do them. Therapists can offer training in these techniques. The training may be covered by some insurance plans. Other things you can do to manage stress include:  Keeping a stress diary. This can help you learn what triggers your stress and ways to control your response.  Thinking about how you respond to certain situations. You may not be able to control everything,  but you can control your reaction.  Making time for activities that help you relax, and not feeling guilty about spending your time in this way.  Therapy combined with coping and stress-reduction skills provides the best chance for successful treatment. Medicines Medicines can help ease symptoms. Medicines for anxiety include:  Anti-anxiety drugs.  Antidepressants.  Beta-blockers.  Medicines may be used as the main treatment for anxiety disorder, along with therapy, or if other treatments are not working. Medicines should be prescribed by a health care provider. Relationships Relationships can play a big part in helping you recover. Try to spend more time connecting with trusted friends and family members. Consider going to couples counseling, taking family education classes, or going to family therapy. Therapy can help you and others better understand the condition. How to  recognize changes in your condition Everyone has a different response to treatment for anxiety. Recovery from anxiety happens when symptoms decrease and stop interfering with your daily activities at home or work. This may mean that you will start to:  Have better concentration and focus.  Sleep better.  Be less irritable.  Have more energy.  Have improved memory.  It is important to recognize when your condition is getting worse. Contact your health care provider if your symptoms interfere with home or work and you do not feel like your condition is improving. Where to find help and support: You can get help and support from these sources:  Self-help groups.  Online and [employer].  A trusted spiritual leader.  Couples counseling.  Family education classes.  Family therapy.  Follow these instructions at home:  Eat a healthy diet that includes plenty of vegetables, fruits, whole grains, low-fat dairy products, and lean protein. Do not eat a lot of foods that are high in solid fats, added sugars, or salt.  Exercise. Most adults should do the following: ? Exercise for at least 150 minutes each week. The exercise should increase your heart rate and make you sweat (moderate-intensity exercise). ? Strengthening exercises at least twice a week.  Cut down on caffeine, tobacco, alcohol, and other potentially harmful substances.  Get the right amount and quality of sleep. Most adults need 7-9 hours of sleep each night.  Make choices that simplify your life.  Take over-the-counter and prescription medicines only as told by your health care provider.  Avoid caffeine, alcohol, and certain over-the-counter cold medicines. These may make you feel worse. Ask your pharmacist which medicines to avoid.  Keep all follow-up visits as told by your health care provider. This is important. Questions to ask your health care provider  Would I benefit from therapy?  How often  should I follow up with a health care provider?  How long do I need to take medicine?  Are there any long-term side effects of my medicine?  Are there any alternatives to taking medicine? Contact a health care provider if:  You have a hard time staying focused or finishing daily tasks.  You spend many hours a day feeling worried about everyday life.  You become exhausted by worry.  You start to have headaches, feel tense, or have nausea.  You urinate more than normal.  You have diarrhea. Get help right away if:  You have a racing heart and shortness of breath.  You have thoughts of hurting yourself or others. If you ever feel like you may hurt yourself or others, or have thoughts about taking your own life, get help right away. You can  go to your nearest emergency department or call:  Your local emergency services (911 in the [place]  A suicide crisis helpline, such as the [place] at 8783021268. This is open 24-hours a day.  Summary  Taking steps to deal with stress can help calm you.  Medicines cannot cure anxiety disorders, but they can help ease symptoms.  Family, friends, and partners can play a big part in helping you recover from an anxiety disorder. This information is not intended to replace advice given to you by your health care provider. Make sure you discuss any questions you have with your health care provider. Document Released: 03/04/2016 Document Revised: 03/04/2016 Document Reviewed: 03/04/2016 Elsevier Interactive Patient Education  2018 [employer].   Increase water intake and follow diabetic diet. Gastroenterology referral placed. Alprazolam 1mg  Rx refilled.  Please call insurance provider to find therapist in network to help treat anxiety. A1c today 8.4, increase from 7.9 in April 2018-PLEASE make appt with your established Endocrinologist. Return in 4 weeks for liver function labs, due to starting Atorvastatin 10mg   in June 2018. Please call clinic with any other questions/concerns.

## 2016-10-01 ENCOUNTER — Telehealth: Payer: Self-pay

## 2016-10-01 LAB — COMPREHENSIVE METABOLIC PANEL
ALT: 11 IU/L (ref 0–32)
AST: 16 IU/L (ref 0–40)
Albumin/Globulin Ratio: 1.3 (ref 1.2–2.2)
Albumin: 4.2 g/dL (ref 3.5–5.5)
Alkaline Phosphatase: 173 IU/L — ABNORMAL HIGH (ref 39–117)
BUN/Creatinine Ratio: 16 (ref 9–23)
BUN: 13 mg/dL (ref 6–24)
Bilirubin Total: 0.4 mg/dL (ref 0.0–1.2)
CO2: 23 mmol/L (ref 20–29)
Calcium: 8.9 mg/dL (ref 8.7–10.2)
Chloride: 101 mmol/L (ref 96–106)
Creatinine, Ser: 0.83 mg/dL (ref 0.57–1.00)
GFR calc Af Amer: 100 mL/min/{1.73_m2} (ref >59)
GFR calc non Af Amer: 87 mL/min/{1.73_m2} (ref >59)
Globulin, Total: 3.2 g/dL (ref 1.5–4.5)
Glucose: 160 mg/dL — ABNORMAL HIGH (ref 65–99)
Potassium: 4.2 mmol/L (ref 3.5–5.2)
Sodium: 139 mmol/L (ref 134–144)
Total Protein: 7.4 g/dL (ref 6.0–8.5)

## 2016-10-01 NOTE — Telephone Encounter (Signed)
Informed patient of lab results and recommendations. °

## 2016-10-16 ENCOUNTER — Encounter: Payer: Self-pay | Admitting: Physician Assistant

## 2016-10-22 ENCOUNTER — Telehealth: Payer: Self-pay | Admitting: Adult Health

## 2016-10-22 NOTE — Telephone Encounter (Signed)
Patient clld to inquiry if provider has ever checked her Iron levels thru blood draw--Pls call her back she wants to know-  --glh

## 2016-10-23 ENCOUNTER — Other Ambulatory Visit: Payer: Self-pay

## 2016-10-23 ENCOUNTER — Ambulatory Visit (INDEPENDENT_AMBULATORY_CARE_PROVIDER_SITE_OTHER): Payer: BLUE CROSS/BLUE SHIELD | Admitting: Physician Assistant

## 2016-10-23 ENCOUNTER — Encounter: Payer: Self-pay | Admitting: Physician Assistant

## 2016-10-23 VITALS — BP 110/82 | HR 100 | Ht 65.0 in | Wt 178.5 lb

## 2016-10-23 DIAGNOSIS — K760 Fatty (change of) liver, not elsewhere classified: Secondary | ICD-10-CM

## 2016-10-23 DIAGNOSIS — R748 Abnormal levels of other serum enzymes: Secondary | ICD-10-CM

## 2016-10-23 DIAGNOSIS — K7689 Other specified diseases of liver: Secondary | ICD-10-CM

## 2016-10-23 DIAGNOSIS — R1032 Left lower quadrant pain: Secondary | ICD-10-CM

## 2016-10-23 MED ORDER — HYOSCYAMINE SULFATE 0.125 MG SL SUBL: 0.1250 mg | SUBLINGUAL_TABLET | Freq: Four times a day (QID) | SUBLINGUAL | 2 refills | Status: DC | PRN

## 2016-10-23 NOTE — Progress Notes (Signed)
Reviewed and agree with initial management plan.  Stephany Poorman T. Travion Ke, MD FACG 

## 2016-10-23 NOTE — Progress Notes (Signed)
Subjective:    Patient ID: Beverly Schwartz, female    DOB: Jan 26, 1971, 46 y.o.   MRN: [id number]  HPI Beverly Schwartz is a 46 year old white female, new to GI today referred by Kerby Nora NP for evaluation of abdominal pain, elevated LFTs and fatty liver. Patient has history of hypertension, anxiety , hyperlipidemia, and hypothyroidism. She has not had any prior GI evaluation. She is aware that she has a fatty liver and has been told that she had cysts on her liver. She states she's been having pain intermittently in her left side over the past year. She says his pain is sharp at times and may last for a few minutes and then ease off. It make recur throughout the day. Pain is not present everyday. Her bowel movements have been fairly normal. She does not notice any change with abdominal pain with or without bowel movements. Occasionally she'll see a small amount of bright red blood. She does feel that eating exacerbates this pain and if she backs off on eating or schizophrenia female this seems to help. Appetite has been fine she has lost about 20 pounds intentionally this year with a metal file S diet and generally just eating healthier. She denies any problems with nausea vomiting heartburn or indigestion. Patient had a limited upper abdominal ultrasound done on March 2018 which showed multiple small hepatic cysts and a 3.4 cm right hepatic lobe cystic lesion minimally complex and likely reflecting a hemorrhagic cyst. She was also noted have a fatty liver. Most recent labs showed an isolated elevated alkaline phosphatase of 173 other hepatic markers normal liver tests in June 2018 alkaline phosphatase of 171 and February 2018 alkaline phosphatase of 133.  Review of Systems Pertinent positive and negative review of systems were noted in the above HPI section.  All other review of systems was otherwise negative.  Outpatient Encounter Prescriptions as of 10/23/2016  Medication Sig  . ALPRAZolam (XANAX) 1 MG  tablet Take 1 tablet (1 mg total) by mouth daily as needed for anxiety.  Marland Kitchen atorvastatin (LIPITOR) 10 MG tablet Take 1 tablet (10 mg total) by mouth daily.  Marland Kitchen CINNAMON PO Take by mouth.  . fluticasone (FLONASE) 50 MCG/ACT nasal spray Place 1 spray into both nostrils daily.  . insulin NPH-regular Human (NOVOLIN 70/30) (70-30) 100 UNIT/ML injection Inject 25 Units into the skin 2 (two) times daily with a meal. Use 25 units in the morning and 15 units in the evening   . levothyroxine (SYNTHROID, LEVOTHROID) 112 MCG tablet Take 112 mcg by mouth daily before breakfast.  . losartan (COZAAR) 50 MG tablet Take 50 mg by mouth daily.  . Omega-3 Fatty Acids (FISH OIL) 1000 MG CAPS Take by mouth daily.  . TURMERIC PO Take by mouth.  . hyoscyamine (LEVSIN SL) 0.125 MG SL tablet Place 1 tablet (0.125 mg total) under the tongue every 6 (six) hours as needed.   No facility-administered encounter medications on file as of 10/23/2016.    Allergies  Allergen Reactions  . Amoxicillin Rash   Patient Active Problem List   Diagnosis Date Noted  . Generalized abdominal pain 09/30/2016  . Anxiety 09/30/2016  . Sinusitis 09/03/2016  . Diabetes mellitus without complication ([place]) 73/53/2992  . Hypothyroid 07/07/2016  . Hypertension 07/07/2016  . Hyperlipidemia 08/06/2010  . Tachycardia   . Chest pain   . Dizziness   . Obesity    Social History   Social History  . Marital status: Divorced    Spouse name:  N/A  . Number of children: 1  . Years of education: N/A   Occupational History  . Real Art therapist    Social History Main Topics  . Smoking status: Never Smoker  . Smokeless tobacco: Never Used  . Alcohol use No  . Drug use: No  . Sexual activity: No   Other Topics Concern  . Not on file   Social History Narrative  . No narrative on file    Beverly Schwartz's family history includes Breast cancer in her maternal grandmother; Healthy in her brother, mother, and son; Heart attack in her paternal  uncle; Heart disease in her father and paternal uncle; Hyperlipidemia in her sister.      Objective:    Vitals:   10/23/16 1037  BP: 110/82  Pulse: 100    Physical Exam  well-developed white female in no acute distress, blood pressure 110/82 pulse 100, height 5 foot 5, weight 178, BMI 29.7. HEENT ;nontraumatic normocephalic EOMI PERRLA sclera anicteric, Cardiovascular; regular rate and rhythm with S1-S2 no murmur rub or gallop, Pulmonary ;clear bilaterally, Abdomen; soft, she is tender in the left mid and left lower quadrant there is no guarding or rebound no palpable mass or hepatosplenomegaly L sounds are present, Rectal; exam not done, Extremities ;no clubbing cyanosis or edema skin warm and dry, Neuropsych; mood and affect appropriate       Assessment & Plan:   #80  46 year old white female with 1 year history of intermittent sharp left-sided abdominal pain, etiology not clear rule out symptomatic diverticular disease, occult colon lesion, IBS. #2 hepatic steatosis #3 isolated elevated alkaline phosphatase present over the past 4-6 months. #4 several hepatic cysts noted on previous upper abdominal ultrasound  Plan; we'll schedule for CT of the abdomen and pelvis which will further evaluate the liver and also help [place] sort out etiology of left-sided abdominal pain.  Start Levsin 1 by mouth every 6 hours when necessary for left abdominal pain Patient will likely need colonoscopy with Dr. Fuller Plan., We will await CT findings.  Check GGT, 5 prime nucleotidase and antimitochondrial antibody.  Calin Ellery S Toma Arts PA-C 10/23/2016   Cc: Esaw Grandchild, NP

## 2016-10-23 NOTE — Patient Instructions (Addendum)
You have been scheduled for a CT scan of the abdomen and pelvis at [facility] ([address]---this is in the same building as Press [facility]).   You are scheduled on 10-28-16 at 1:30pm. You should arrive 15 minutes prior to your appointment time for registration. Please follow the written instructions below on the day of your exam:  WARNING: IF YOU ARE ALLERGIC TO IODINE/X-RAY DYE, PLEASE NOTIFY RADIOLOGY IMMEDIATELY AT [phone]! YOU WILL BE GIVEN A 13 HOUR PREMEDICATION PREP.  1) Do not eat or drink anything after 9:30am (4 hours prior to your test) 2) You have been given 2 bottles of oral contrast to drink. The solution may taste               better if refrigerated, but do NOT add ice or any other liquid to this solution. Shake             well before drinking.    Drink 1 bottle of contrast @ 11:30am (2 hours prior to your exam)  Drink 1 bottle of contrast @ 12:30pm (1 hour prior to your exam)  You may take any medications as prescribed with a small amount of water except for the following: Metformin, Glucophage, Glucovance, Avandamet, Riomet, Fortamet, Actoplus Met, Janumet, Glumetza or Metaglip. The above medications must be held the day of the exam AND 48 hours after the exam.  The purpose of you drinking the oral contrast is to aid in the visualization of your intestinal tract. The contrast solution may cause some diarrhea. Before your exam is started, you will be given a small amount of fluid to drink. Depending on your individual set of symptoms, you may also receive an intravenous injection of x-ray contrast/dye. Plan on being at [facility] for 30 minutes or longer, depending on the type of exam you are having performed.  This test typically takes 30-45 minutes to complete.  If you have any questions regarding your exam or if you need to reschedule, you may call the CT department at [phone] between the hours of 8:00 am and 5:00 pm,  Monday-Friday.   Your physician has requested that you go to the basement for lab work before leaving today.  We have sent the following medications to your pharmacy for you to pick up at your convenience: Levsin    If you are age 30 or older, your body mass index should be between 23-30. Your Body mass index is 29.7 kg/m. If this is out of the aforementioned range listed, please consider follow up with your Primary Care Provider.  If you are age 13 or younger, your body mass index should be between 19-25. Your Body mass index is 29.7 kg/m. If this is out of the aformentioned range listed, please consider follow up with your Primary Care Provider.        ________________________________________________________________________

## 2016-10-23 NOTE — Telephone Encounter (Signed)
Attempted to return pt's call, but there was no answer.  Charyl Bigger, CMA

## 2016-10-24 ENCOUNTER — Telehealth: Payer: Self-pay | Admitting: Physician Assistant

## 2016-10-24 NOTE — Telephone Encounter (Signed)
Spoke with pt and advised she has not had iron studies done.  She requests these to be done dt fatigue.  Advised pt that she would first need a CBC indicating anemia before doing iron studies and she would need to see provider for evaluation.  Pt cancelled appt for labs 11/03/16, stating that she had "liver tests" done with GI yesterday.  Charyl Bigger, CMA

## 2016-10-27 LAB — NUCLEOTIDASE, 5', BLOOD: 5-Nucleotidase: 8 U/L (ref 0–10)

## 2016-10-27 NOTE — Telephone Encounter (Signed)
Patient states she has been told she will be responsible for $1600 if the CT is done at our imaging facility. She is trying to find a place that is more affordable for her.  Per the patient's request her CT is moved to 11/03/16 at 1pm.

## 2016-10-28 ENCOUNTER — Inpatient Hospital Stay: Admission: RE | Admit: 2016-10-28 | Payer: Self-pay | Source: Ambulatory Visit

## 2016-11-03 ENCOUNTER — Other Ambulatory Visit: Payer: Self-pay

## 2016-11-03 ENCOUNTER — Inpatient Hospital Stay: Admission: RE | Admit: 2016-11-03 | Payer: Self-pay | Source: Ambulatory Visit

## 2016-11-03 NOTE — Telephone Encounter (Signed)
Left a message for the patient to call me. I will move her CT to where her insurance says she should go.

## 2016-11-04 ENCOUNTER — Ambulatory Visit: Payer: Self-pay | Admitting: Adult Health

## 2016-11-04 NOTE — Telephone Encounter (Signed)
Patient chooses [facility]. Order faxed. Confirmed with the central scheduler of [facility]. She gives me a number to share with the patient. Patient is to call and set up her appointment.

## 2016-11-06 ENCOUNTER — Telehealth: Payer: Self-pay | Admitting: Physician Assistant

## 2016-11-07 NOTE — Telephone Encounter (Signed)
Spoke with the patient. Her imaging will be done 11/13/16. She wanted to know her lab work results. Only  Nucleotidase, 5', blood  Results are wnl's GGT was not done.

## 2016-11-18 ENCOUNTER — Telehealth: Payer: Self-pay | Admitting: Physician Assistant

## 2016-11-18 NOTE — Telephone Encounter (Signed)
ok 

## 2016-11-18 NOTE — Telephone Encounter (Signed)
Patient states she is symptom free. She does not have any pain. Normal bowel movements. She is taking her medications. She is cancelling her CT and will let us know if anything changes.

## 2016-12-09 NOTE — Progress Notes (Signed)
Subjective:    Patient ID: Beverly Schwartz, female    DOB: 30-Dec-1970, 46 y.o.   MRN: [id number]  HPI:  09/30/2016 OV:Beverly Schwartz presents LLQ abdominal pain that began 3 days ago and has been intermittent since then.  Pain will develop 5- 19mins after eating, localized over LLQ, rated 7/10 and described as sharp.  Pain lasts for 30 seconds, then self resolves.  She reports 1-2 BMs daily and had normal BM this morning.  She denies blood in urine/stool.  She denies N/V/D/fever/night sweats/poor appetite.  I reviewed Abdominal US results from Spring 2018, all her recent labs-Gastro referral placed. She also requests RF on Alprazolam due to upcoming family trip and "it always causes my anxiety to jump up".   She denies EOTH/tobacco use.   She reports walking and WII workouts several days a week.  OV Today: She was seen at GI 10/23/2016-notes below: "#57  46 year old white female with 1 year history of intermittent sharp left-sided abdominal pain, etiology not clear rule out symptomatic diverticular disease, occult colon lesion, IBS. #2 hepatic steatosis #3 isolated elevated alkaline phosphatase present over the past 4-6 months. #4 several hepatic cysts noted on previous upper abdominal ultrasound Plan; we'll schedule for CT of the abdomen and pelvis which will further evaluate the liver and also help [place] sort out etiology of left-sided abdominal pain. Start Levsin 1 by mouth every 6 hours when necessary for left abdominal pain Patient will likely need colonoscopy with Dr. Fuller Plan., We will await CT findings. Check GGT, 5 prime nucleotidase and antimitochondrial antibody."  CT scan never completed and she wants to discuss the radiation risks of this imaging study.  She "googled" CT scans and feels that the exposure could cause cancer and she would prefer [place] study instead.  We discussed at length the she has had several abnormal abdominal USs and several elevated LFTs, despite ETOH/Acetaminphen avoidance  and no family hx of GI cancer.  She was seen and treated by GI and she should request this change through that practice. Reluctantly she admitted to never starting Atorvastatin and continues to eat diet high in saturated fat/CHO.  The only exercise that she currently gets is walking their dog "shadow" 10 mins/day.  We again discussed utilizing free workout videos via YouTube and Pinterest.  She reports "looking at some yoga videos", and it planning on using them a "few times a week". She has upcoming appt with Endo/Dr. Buddy Duty 10/18 and will send [place] her A1c from OV.   Patient Care Team    Relationship Specialty Notifications Start End  Mina Marble D, NP PCP - General Family Medicine  07/07/16   Delrae Rend, MD Consulting Physician Endocrinology  07/07/16     Patient Active Problem List   Diagnosis Date Noted  . Healthcare maintenance 12/10/2016  . Elevated LFTs 12/10/2016  . Generalized abdominal pain 09/30/2016  . Anxiety 09/30/2016  . Sinusitis 09/03/2016  . Diabetes mellitus without complication ([place]) 97/35/3299  . Hypothyroid 07/07/2016  . Hypertension 07/07/2016  . Hyperlipidemia 08/06/2010  . Tachycardia   . Chest pain   . Dizziness   . Obesity      Past Medical History:  Diagnosis Date  . Anxiety   . Chest pain   . Depression   . Diabetes mellitus   . Dizziness   . Hyperlipidemia   . Hypertension   . Hypothyroidism   . Obesity   . Tachycardia      Past Surgical History:  Procedure Laterality Date  .  WISDOM TOOTH EXTRACTION     age 62's     Family History  Problem Relation Age of Onset  . Heart disease Father   . Healthy Mother   . Hyperlipidemia Sister   . Healthy Brother   . Heart disease Paternal Uncle   . Heart attack Paternal Uncle   . Healthy Son   . Breast cancer Maternal Grandmother   . Colon cancer Neg Hx   . Esophageal cancer Neg Hx   . Rectal cancer Neg Hx   . Liver cancer Neg Hx      History  Drug Use No     History  Alcohol  Use No     History  Smoking Status  . Never Smoker  Smokeless Tobacco  . Never Used     Outpatient Encounter Prescriptions as of 12/10/2016  Medication Sig  . ALPRAZolam (XANAX) 1 MG tablet Take 1 tablet (1 mg total) by mouth daily as needed for anxiety.  Marland Kitchen CINNAMON PO Take by mouth.  . fluticasone (FLONASE) 50 MCG/ACT nasal spray Place 1 spray into both nostrils daily.  . hyoscyamine (LEVSIN SL) 0.125 MG SL tablet Place 1 tablet (0.125 mg total) under the tongue every 6 (six) hours as needed.  . insulin NPH-regular Human (NOVOLIN 70/30) (70-30) 100 UNIT/ML injection Inject 25 Units into the skin 2 (two) times daily with a meal. Use 25 units in the morning and 15 units in the evening   . levothyroxine (SYNTHROID, LEVOTHROID) 112 MCG tablet Take 112 mcg by mouth daily before breakfast.  . losartan (COZAAR) 50 MG tablet Take 1 tablet (50 mg total) by mouth daily.  . Omega-3 Fatty Acids (FISH OIL) 1000 MG CAPS Take by mouth daily.  . TURMERIC PO Take by mouth.  . [DISCONTINUED] fluticasone (FLONASE) 50 MCG/ACT nasal spray Place 1 spray into both nostrils daily.  . [DISCONTINUED] losartan (COZAAR) 50 MG tablet Take 50 mg by mouth daily.  Marland Kitchen atorvastatin (LIPITOR) 10 MG tablet Take 1 tablet (10 mg total) by mouth daily. (Patient not taking: Reported on 12/10/2016)   No facility-administered encounter medications on file as of 12/10/2016.     Allergies: Amoxicillin  Body mass index is 29.69 kg/m.  Height 5\' 5"  (1.651 m), weight 178 lb 6.4 oz (80.9 kg), last menstrual period 11/26/2016.     Review of Systems  Constitutional: Positive for fatigue. Negative for activity change, appetite change, chills, diaphoresis, fever and unexpected weight change.  Respiratory: Negative for cough, chest tightness, shortness of breath, wheezing and stridor.   Cardiovascular: Negative for chest pain, palpitations and leg swelling.  Gastrointestinal: Negative for abdominal distention, abdominal pain,  anal bleeding, blood in stool, constipation, diarrhea, nausea, rectal pain and vomiting.  Endocrine: Negative for cold intolerance, heat intolerance, polydipsia, polyphagia and polyuria.  Genitourinary: Negative for difficulty urinating, flank pain and hematuria.  Skin: Negative for color change, pallor, rash and wound.  Allergic/Immunologic: Negative for immunocompromised state.  Neurological: Negative for dizziness and headaches.  Hematological: Does not bruise/bleed easily.  Psychiatric/Behavioral: Negative for sleep disturbance. The patient is nervous/anxious.        Objective:   Physical Exam  Constitutional: She is oriented to person, place, and time. She appears well-developed and well-nourished. No distress.  HENT:  Head: Normocephalic and atraumatic.  Right Ear: External ear normal.  Left Ear: External ear normal.  Eyes: Pupils are equal, round, and reactive to light. Conjunctivae are normal.  Neck: Normal range of motion. Neck supple.  Cardiovascular: Normal rate,  regular rhythm, normal heart sounds and intact distal pulses.   No murmur heard. Pulmonary/Chest: Effort normal and breath sounds normal. No respiratory distress. She has no wheezes. She has no rales. She exhibits no tenderness.  Abdominal: Soft. Bowel sounds are normal. She exhibits no distension and no mass. There is no hepatomegaly. There is no tenderness. There is no rigidity, no rebound, no guarding, no CVA tenderness, no tenderness at McBurney's point and negative Murphy's sign. No hernia.  Lymphadenopathy:    She has no cervical adenopathy.  Neurological: She is alert and oriented to person, place, and time. Coordination normal.  Skin: Skin is warm and dry. No rash noted. She is not diaphoretic. No erythema. No pallor.  Psychiatric: She has a normal mood and affect. Her behavior is normal. Judgment and thought content normal.  Nursing note and vitals reviewed.         Assessment & Plan:   1. Essential  hypertension   2. Acute recurrent maxillary sinusitis   3. Diabetes mellitus without complication ([place])   4. Other hyperlipidemia   5. Generalized abdominal pain   6. Healthcare maintenance   7. Elevated LFTs     Hypertension BP at goal 124/81, HR 93 Continue Losartan 50mg  daily-RF provided.  Sinusitis Floanse refilled today.  Diabetes mellitus without complication ([place]) Last 2 A1cs 07/07/16: 7.9 09/30/16: 8.4 Please keep your appt with Dr. Kerr/Endocrinologist next month and please send [place] A1c reading from appt. Currently on Novolin 70/30 25 U in am and 15 U in pm with meals. Increase exercise!   Hyperlipidemia After lengthy discussion she reports never taking Atorvastatin and prefers to remain off the medication. Discussed at length the importance of heart healthy diet and exercise to normalize chol levels.   Generalized abdominal pain Denies any pain at present. Started on Hyoscyamine 0.125mg  SL Q6H PRN   Healthcare maintenance Increase water intake, strive for at least 90 ounces/day.   Follow Heart Healthy diet Increase regular exercise.  Recommend at least 30 minutes daily, 5 days per week of walking, jogging, biking, swimming, YouTube/Pinterest workout videos. It is imperative that you use diet and exercise to normalize cholesterol levels. Please schedule complete physical with fasting labs in 3 months.  Elevated LFTs Please call your established GI specialist to inquire about changing CT scan to abdominal US. Continue to avoid ETOH/Acetaminophen.     FOLLOW-UP:  Return in about 3 months (around 03/11/2017) for CPE, Fasting Lab Draw.

## 2016-12-10 ENCOUNTER — Encounter: Payer: Self-pay | Admitting: Adult Health

## 2016-12-10 ENCOUNTER — Ambulatory Visit (INDEPENDENT_AMBULATORY_CARE_PROVIDER_SITE_OTHER): Payer: BLUE CROSS/BLUE SHIELD | Admitting: Adult Health

## 2016-12-10 DIAGNOSIS — R1084 Generalized abdominal pain: Secondary | ICD-10-CM

## 2016-12-10 DIAGNOSIS — J0101 Acute recurrent maxillary sinusitis: Secondary | ICD-10-CM | POA: Diagnosis not present

## 2016-12-10 DIAGNOSIS — I10 Essential (primary) hypertension: Secondary | ICD-10-CM

## 2016-12-10 DIAGNOSIS — R945 Abnormal results of liver function studies: Secondary | ICD-10-CM

## 2016-12-10 DIAGNOSIS — R7989 Other specified abnormal findings of blood chemistry: Secondary | ICD-10-CM

## 2016-12-10 DIAGNOSIS — E7849 Other hyperlipidemia: Secondary | ICD-10-CM

## 2016-12-10 DIAGNOSIS — E784 Other hyperlipidemia: Secondary | ICD-10-CM | POA: Diagnosis not present

## 2016-12-10 DIAGNOSIS — Z0000 Encounter for general adult medical examination without abnormal findings: Secondary | ICD-10-CM | POA: Diagnosis not present

## 2016-12-10 DIAGNOSIS — E119 Type 2 diabetes mellitus without complications: Secondary | ICD-10-CM | POA: Diagnosis not present

## 2016-12-10 MED ORDER — LOSARTAN POTASSIUM 50 MG PO TABS: 50.0000 mg | ORAL_TABLET | Freq: Every day | ORAL | 1 refills | Status: DC

## 2016-12-10 MED ORDER — FLUTICASONE PROPIONATE 50 MCG/ACT NA SUSP: 1.0000 | Freq: Every day | NASAL | 2 refills | Status: AC

## 2016-12-10 NOTE — Assessment & Plan Note (Addendum)
Last 2 A1cs 07/07/16: 7.9 09/30/16: 8.4 Please keep your appt with Dr. Kerr/Endocrinologist next month and please send [place] A1c reading from appt. Currently on Novolin 70/30 25 U in am and 15 U in pm with meals. Increase exercise!

## 2016-12-10 NOTE — Assessment & Plan Note (Addendum)
Denies any pain at present. Started on Hyoscyamine 0.125mg  SL Q6H PRN

## 2016-12-10 NOTE — Assessment & Plan Note (Signed)
Floanse refilled today.

## 2016-12-10 NOTE — Assessment & Plan Note (Signed)
After lengthy discussion she reports never taking Atorvastatin and prefers to remain off the medication. Discussed at length the importance of heart healthy diet and exercise to normalize chol levels.

## 2016-12-10 NOTE — Assessment & Plan Note (Signed)
Please call your established GI specialist to inquire about changing CT scan to abdominal US. Continue to avoid ETOH/Acetaminophen.

## 2016-12-10 NOTE — Patient Instructions (Signed)
Heart-Healthy Eating Plan Many factors influence your heart health, including eating and exercise habits. Heart (coronary) risk increases with abnormal blood fat (lipid) levels. Heart-healthy meal planning includes limiting unhealthy fats, increasing healthy fats, and making other small dietary changes. This includes maintaining a healthy body weight to help keep lipid levels within a normal range. What is my plan? Your health care provider recommends that you:  Get no more than __25_% of the total calories in your daily diet from fat.  Limit your intake of saturated fat to less than ___5___% of your total calories each day.  Limit the amount of cholesterol in your diet to less than _300 __ mg per day.  What types of fat should I choose?  Choose healthy fats more often. Choose monounsaturated and polyunsaturated fats, such as olive oil and canola oil, flaxseeds, walnuts, almonds, and seeds.  Eat more omega-3 fats. Good choices include salmon, mackerel, sardines, tuna, flaxseed oil, and ground flaxseeds. Aim to eat fish at least two times each week.  Limit saturated fats. Saturated fats are primarily found in animal products, such as meats, butter, and cream. Plant sources of saturated fats include palm oil, palm kernel oil, and coconut oil.  Avoid foods with partially hydrogenated oils in them. These contain trans fats. Examples of foods that contain trans fats are stick margarine, some tub margarines, cookies, crackers, and other baked goods. What general guidelines do I need to follow?  Check food labels carefully to identify foods with trans fats or high amounts of saturated fat.  Fill one half of your plate with vegetables and green salads. Eat 4-5 servings of vegetables per day. A serving of vegetables equals 1 cup of raw leafy vegetables,  cup of raw or cooked cut-up vegetables, or  cup of vegetable juice.  Fill one fourth of your plate with whole grains. Look for the word "whole"  as the first word in the ingredient list.  Fill one fourth of your plate with lean protein foods.  Eat 4-5 servings of fruit per day. A serving of fruit equals one medium whole fruit,  cup of dried fruit,  cup of fresh, frozen, or canned fruit, or  cup of 100% fruit juice.  Eat more foods that contain soluble fiber. Examples of foods that contain this type of fiber are apples, broccoli, carrots, beans, peas, and barley. Aim to get 20-30 g of fiber per day.  Eat more home-cooked food and less restaurant, buffet, and fast food.  Limit or avoid alcohol.  Limit foods that are high in starch and sugar.  Avoid fried foods.  Cook foods by using methods other than frying. Baking, boiling, grilling, and broiling are all great options. Other fat-reducing suggestions include: ? Removing the skin from poultry. ? Removing all visible fats from meats. ? Skimming the fat off of stews, soups, and gravies before serving them. ? Steaming vegetables in water or broth.  Lose weight if you are overweight. Losing just 5-10% of your initial body weight can help your overall health and prevent diseases such as diabetes and heart disease.  Increase your consumption of nuts, legumes, and seeds to 4-5 servings per week. One serving of dried beans or legumes equals  cup after being cooked, one serving of nuts equals 1 ounces, and one serving of seeds equals  ounce or 1 tablespoon.  You may need to monitor your salt (sodium) intake, especially if you have high blood pressure. Talk with your health care provider or dietitian to  get more information about reducing sodium. What foods can I eat? Grains  Breads, including [place], white, pita, wheat, raisin, rye, oatmeal, and [place]. Tortillas that are neither fried nor made with lard or trans fat. Low-fat rolls, including hotdog and hamburger buns and English muffins. Biscuits. Muffins. Waffles. Pancakes. Light popcorn. Whole-grain cereals. Flatbread. Melba  toast. Pretzels. Breadsticks. Rusks. Low-fat snacks and crackers, including oyster, saltine, matzo, graham, animal, and rye. Rice and pasta, including brown rice and those that are made with whole wheat. Vegetables All vegetables. Fruits All fruits, but limit coconut. Meats and Other Protein Sources Lean, well-trimmed beef, veal, pork, and lamb. Chicken and [place] without skin. All fish and shellfish. Wild duck, rabbit, pheasant, and venison. Egg whites or low-cholesterol egg substitutes. Dried beans, peas, lentils, and tofu.Seeds and most nuts. Dairy Low-fat or nonfat cheeses, including ricotta, string, and mozzarella. Skim or 1% milk that is liquid, powdered, or evaporated. Buttermilk that is made with low-fat milk. Nonfat or low-fat yogurt. Beverages Mineral water. Diet carbonated beverages. Sweets and Desserts Sherbets and fruit ices. Honey, jam, marmalade, jelly, and syrups. Meringues and gelatins. Pure sugar candy, such as hard candy, jelly beans, gumdrops, mints, marshmallows, and small amounts of dark chocolate. [employer] Eat all sweets and desserts in moderation. Fats and Oils Nonhydrogenated (trans-free) margarines. Vegetable oils, including soybean, sesame, sunflower, olive, peanut, safflower, corn, canola, and cottonseed. Salad dressings or mayonnaise that are made with a vegetable oil. Limit added fats and oils that you use for cooking, baking, salads, and as spreads. Other Cocoa powder. Coffee and tea. All seasonings and condiments. The items listed above may not be a complete list of recommended foods or beverages. Contact your dietitian for more options. What foods are not recommended? Grains Breads that are made with saturated or trans fats, oils, or whole milk. Croissants. Butter rolls. Cheese breads. Sweet rolls. Donuts. Buttered popcorn. Chow mein noodles. High-fat crackers, such as cheese or butter crackers. Meats and Other Protein Sources Fatty meats, such as  hotdogs, short ribs, sausage, spareribs, bacon, ribeye roast or steak, and mutton. High-fat deli meats, such as salami and bologna. Caviar. Domestic duck and goose. Organ meats, such as kidney, liver, sweetbreads, brains, gizzard, chitterlings, and heart. Dairy Cream, sour cream, cream cheese, and creamed cottage cheese. Whole milk cheeses, including blue (bleu), Monte[place], [place], [place], [place], [place], [place], [place], [place], and [place]. Whole or 2% milk that is liquid, evaporated, or condensed. Whole buttermilk. Cream sauce or high-fat cheese sauce. Yogurt that is made from whole milk. Beverages Regular sodas and drinks with added sugar. Sweets and Desserts Frosting. Pudding. Cookies. Cakes other than angel food cake. Candy that has milk chocolate or white chocolate, hydrogenated fat, butter, coconut, or unknown ingredients. Buttered syrups. Full-fat ice cream or ice cream drinks. Fats and Oils Gravy that has suet, meat fat, or shortening. Cocoa butter, hydrogenated oils, palm oil, coconut oil, palm kernel oil. These can often be found in baked products, candy, fried foods, nondairy creamers, and whipped toppings. Solid fats and shortenings, including bacon fat, salt pork, lard, and butter. Nondairy cream substitutes, such as coffee creamers and sour cream substitutes. Salad dressings that are made of unknown oils, cheese, or sour cream. The items listed above may not be a complete list of foods and beverages to avoid. Contact your dietitian for more information. This information is not intended to replace advice given to you by your health care provider. Make sure you discuss any questions you have with your health  care provider. Document Released: 12/18/2007 Document Revised: 09/28/2015 Document Reviewed: 09/01/2013 Elsevier Interactive Patient Education  2017 [place].  Please call your established GI specialist to inquire about changing CT scan to abdominal US. Please keep your  appt with Dr. Kerr/Endocrinologist next month and please send [place] A1c reading from appt. Increase water intake, strive for at least 90 ounces/day.   Follow Heart Healthy diet Increase regular exercise.  Recommend at least 30 minutes daily, 5 days per week of walking, jogging, biking, swimming, YouTube/Pinterest workout videos. It is imperative that you use diet and exercise to normalize cholesterol levels. Please schedule complete physical with fasting labs in 3 months. NICE TO SEE YOU!

## 2016-12-10 NOTE — Assessment & Plan Note (Addendum)
Increase water intake, strive for at least 90 ounces/day.   Follow Heart Healthy diet Increase regular exercise.  Recommend at least 30 minutes daily, 5 days per week of walking, jogging, biking, swimming, YouTube/Pinterest workout videos. It is imperative that you use diet and exercise to normalize cholesterol levels. Please schedule complete physical with fasting labs in 3 months.

## 2016-12-10 NOTE — Assessment & Plan Note (Signed)
BP at goal 124/81, HR 93 Continue Losartan 50mg  daily-RF provided.

## 2016-12-15 ENCOUNTER — Telehealth: Payer: Self-pay | Admitting: Physician Assistant

## 2016-12-15 NOTE — Telephone Encounter (Signed)
Patient says she is symptom free. When she spoke with her PCP about this, she was told GI should be the provider to follow up on her u/s of the abdomen. She still declines a CT of the abdomen. Again, she states her "stomach is fine."

## 2016-12-15 NOTE — Telephone Encounter (Signed)
I do not think she needs another [place] - she was to have a GGT, and AMA (antimitochodrial AB) done at time of last visit which were not done - she has a persistently elevated alk phosp, and had several hepatic cysts on [place] - so... My recc is same as at office visit - she needs to get those labs done , consider Ct of abdomen ,  Both to evaluate left sided pain, and to look at liver , and CT negative consider colonoscopy to evaluate her left sided pain .  If she doesn't want to pursue any of this  ,please document that we discussed, and still recommend .

## 2016-12-15 NOTE — Telephone Encounter (Signed)
I ordered the CT scan because of complaints of sharp Left sided abdominal pain - not to evaluate her liver ,though that would be a good idea also - she was to be considered for Colonoscopy if CT negative . I f we don't do a CT or a colonoscopy we are not doing anything to evaluate c/o left sided abdominal pains

## 2016-12-15 NOTE — Telephone Encounter (Signed)
Last abdominal u/s was in March 2018. Is there any reason to repeat it at this time?

## 2016-12-16 NOTE — Telephone Encounter (Signed)
Left a message to call back.

## 2016-12-25 NOTE — Telephone Encounter (Signed)
Left another message asking for a return call.

## 2017-01-05 NOTE — Telephone Encounter (Signed)
Left another message. Request she call back to discuss the need completed follow up.

## 2017-05-30 ENCOUNTER — Inpatient Hospital Stay (HOSPITAL_BASED_OUTPATIENT_CLINIC_OR_DEPARTMENT_OTHER)
Admission: EM | Admit: 2017-05-30 | Discharge: 2017-06-01 | DRG: 375 | Disposition: A | Discharge status: Home / Self Care | Payer: BLUE CROSS/BLUE SHIELD | Attending: Internal Medicine | Admitting: Internal Medicine

## 2017-05-30 ENCOUNTER — Emergency Department (HOSPITAL_BASED_OUTPATIENT_CLINIC_OR_DEPARTMENT_OTHER): Payer: BLUE CROSS/BLUE SHIELD

## 2017-05-30 ENCOUNTER — Other Ambulatory Visit: Payer: Self-pay

## 2017-05-30 ENCOUNTER — Encounter (HOSPITAL_BASED_OUTPATIENT_CLINIC_OR_DEPARTMENT_OTHER): Payer: Self-pay | Admitting: Emergency Medicine

## 2017-05-30 DIAGNOSIS — E7849 Other hyperlipidemia: Secondary | ICD-10-CM | POA: Diagnosis not present

## 2017-05-30 DIAGNOSIS — F419 Anxiety disorder, unspecified: Secondary | ICD-10-CM | POA: Diagnosis present

## 2017-05-30 DIAGNOSIS — K633 Ulcer of intestine: Secondary | ICD-10-CM | POA: Diagnosis present

## 2017-05-30 DIAGNOSIS — K621 Rectal polyp: Secondary | ICD-10-CM | POA: Diagnosis present

## 2017-05-30 DIAGNOSIS — R1084 Generalized abdominal pain: Secondary | ICD-10-CM | POA: Diagnosis present

## 2017-05-30 DIAGNOSIS — A419 Sepsis, unspecified organism: Secondary | ICD-10-CM | POA: Diagnosis not present

## 2017-05-30 DIAGNOSIS — N390 Urinary tract infection, site not specified: Secondary | ICD-10-CM | POA: Diagnosis present

## 2017-05-30 DIAGNOSIS — Z79899 Other long term (current) drug therapy: Secondary | ICD-10-CM | POA: Diagnosis not present

## 2017-05-30 DIAGNOSIS — I10 Essential (primary) hypertension: Secondary | ICD-10-CM | POA: Diagnosis present

## 2017-05-30 DIAGNOSIS — Z794 Long term (current) use of insulin: Secondary | ICD-10-CM

## 2017-05-30 DIAGNOSIS — K648 Other hemorrhoids: Secondary | ICD-10-CM | POA: Diagnosis present

## 2017-05-30 DIAGNOSIS — K921 Melena: Secondary | ICD-10-CM | POA: Diagnosis present

## 2017-05-30 DIAGNOSIS — E039 Hypothyroidism, unspecified: Secondary | ICD-10-CM | POA: Diagnosis present

## 2017-05-30 DIAGNOSIS — C787 Secondary malignant neoplasm of liver and intrahepatic bile duct: Secondary | ICD-10-CM | POA: Diagnosis present

## 2017-05-30 DIAGNOSIS — E876 Hypokalemia: Secondary | ICD-10-CM | POA: Diagnosis present

## 2017-05-30 DIAGNOSIS — R6510 Systemic inflammatory response syndrome (SIRS) of non-infectious origin without acute organ dysfunction: Secondary | ICD-10-CM | POA: Diagnosis present

## 2017-05-30 DIAGNOSIS — E785 Hyperlipidemia, unspecified: Secondary | ICD-10-CM | POA: Diagnosis present

## 2017-05-30 DIAGNOSIS — E119 Type 2 diabetes mellitus without complications: Secondary | ICD-10-CM

## 2017-05-30 DIAGNOSIS — K56609 Unspecified intestinal obstruction, unspecified as to partial versus complete obstruction: Secondary | ICD-10-CM

## 2017-05-30 DIAGNOSIS — Z880 Allergy status to penicillin: Secondary | ICD-10-CM

## 2017-05-30 DIAGNOSIS — D509 Iron deficiency anemia, unspecified: Secondary | ICD-10-CM | POA: Diagnosis present

## 2017-05-30 DIAGNOSIS — R509 Fever, unspecified: Secondary | ICD-10-CM

## 2017-05-30 DIAGNOSIS — C19 Malignant neoplasm of rectosigmoid junction: Secondary | ICD-10-CM | POA: Diagnosis not present

## 2017-05-30 DIAGNOSIS — F329 Major depressive disorder, single episode, unspecified: Secondary | ICD-10-CM | POA: Diagnosis present

## 2017-05-30 HISTORY — DX: Other specified postprocedural states: R11.2

## 2017-05-30 HISTORY — DX: Nausea with vomiting, unspecified: Z98.890

## 2017-05-30 HISTORY — DX: Adverse effect of unspecified anesthetic, initial encounter: T41.45XA

## 2017-05-30 HISTORY — DX: Other complications of anesthesia, initial encounter: T88.59XA

## 2017-05-30 LAB — URINALYSIS, ROUTINE W REFLEX MICROSCOPIC
Glucose, UA: NEGATIVE mg/dL
Hgb urine dipstick: NEGATIVE
Ketones, ur: 15 mg/dL — AB
Leukocytes, UA: NEGATIVE
Nitrite: NEGATIVE
Protein, ur: 30 mg/dL — AB
Specific Gravity, Urine: 1.025 (ref 1.005–1.030)
pH: 6 (ref 5.0–8.0)

## 2017-05-30 LAB — COMPREHENSIVE METABOLIC PANEL
ALT: 16 U/L (ref 14–54)
AST: 30 U/L (ref 15–41)
Albumin: 4 g/dL (ref 3.5–5.0)
Alkaline Phosphatase: 174 U/L — ABNORMAL HIGH (ref 38–126)
Anion gap: 11 (ref 5–15)
BUN: 10 mg/dL (ref 6–20)
CO2: 25 mmol/L (ref 22–32)
Calcium: 7.8 mg/dL — ABNORMAL LOW (ref 8.9–10.3)
Chloride: 102 mmol/L (ref 101–111)
Creatinine, Ser: 0.72 mg/dL (ref 0.44–1.00)
GFR calc Af Amer: >60 mL/min (ref >60)
GFR calc non Af Amer: >60 mL/min (ref >60)
Glucose, Bld: 197 mg/dL — ABNORMAL HIGH (ref 65–99)
Potassium: 3.4 mmol/L — ABNORMAL LOW (ref 3.5–5.1)
Sodium: 138 mmol/L (ref 135–145)
Total Bilirubin: 0.2 mg/dL — ABNORMAL LOW (ref 0.3–1.2)
Total Protein: 8.4 g/dL — ABNORMAL HIGH (ref 6.5–8.1)

## 2017-05-30 LAB — URINALYSIS, MICROSCOPIC (REFLEX): RBC / HPF: NONE SEEN RBC/hpf (ref 0–5)

## 2017-05-30 LAB — CBC
HCT: 33.9 % — ABNORMAL LOW (ref 36.0–46.0)
Hemoglobin: 10.2 g/dL — ABNORMAL LOW (ref 12.0–15.0)
MCH: 22 pg — ABNORMAL LOW (ref 26.0–34.0)
MCHC: 30.1 g/dL (ref 30.0–36.0)
MCV: 73.1 fL — ABNORMAL LOW (ref 78.0–100.0)
Platelets: 396 10*3/uL (ref 150–400)
RBC: 4.64 MIL/uL (ref 3.87–5.11)
RDW: 16 % — ABNORMAL HIGH (ref 11.5–15.5)
WBC: 14.1 10*3/uL — ABNORMAL HIGH (ref 4.0–10.5)

## 2017-05-30 LAB — I-STAT CG4 LACTIC ACID, ED: Lactic Acid, Venous: 1.73 mmol/L (ref 0.5–1.9)

## 2017-05-30 LAB — INFLUENZA PANEL BY PCR (TYPE A & B)
Influenza A By PCR: NEGATIVE
Influenza B By PCR: NEGATIVE

## 2017-05-30 LAB — LIPASE, BLOOD: Lipase: 23 U/L (ref 11–51)

## 2017-05-30 LAB — MAGNESIUM: Magnesium: 2.2 mg/dL (ref 1.7–2.4)

## 2017-05-30 LAB — PREGNANCY, URINE: Preg Test, Ur: NEGATIVE

## 2017-05-30 LAB — PHOSPHORUS: Phosphorus: 3.8 mg/dL (ref 2.5–4.6)

## 2017-05-30 LAB — GLUCOSE, CAPILLARY: Glucose-Capillary: 166 mg/dL — ABNORMAL HIGH (ref 65–99)

## 2017-05-30 MED ORDER — KETOROLAC TROMETHAMINE 30 MG/ML IJ SOLN
15.0000 mg | Freq: Once | INTRAMUSCULAR | Status: AC
  Administered 2017-05-30: 15 mg via INTRAVENOUS
  Filled 2017-05-30: qty 1

## 2017-05-30 MED ORDER — POTASSIUM CHLORIDE CRYS ER 20 MEQ PO TBCR
40.0000 meq | EXTENDED_RELEASE_TABLET | Freq: Once | ORAL | Status: AC
  Administered 2017-05-30: 40 meq via ORAL
  Filled 2017-05-30: qty 2

## 2017-05-30 MED ORDER — SODIUM CHLORIDE 0.9 % IV BOLUS (SEPSIS)
1000.0000 mL | Freq: Once | INTRAVENOUS | Status: AC
  Administered 2017-05-30: 1000 mL via INTRAVENOUS

## 2017-05-30 MED ORDER — ACETAMINOPHEN 325 MG PO TABS
650.0000 mg | ORAL_TABLET | Freq: Four times a day (QID) | ORAL | Status: DC | PRN
  Administered 2017-05-31 – 2017-06-01 (×2): 650 mg via ORAL
  Filled 2017-05-30 (×2): qty 2

## 2017-05-30 MED ORDER — ACETAMINOPHEN 325 MG PO TABS
650.0000 mg | ORAL_TABLET | Freq: Once | ORAL | Status: AC
  Administered 2017-05-30: 650 mg via ORAL

## 2017-05-30 MED ORDER — ONDANSETRON HCL 4 MG/2ML IJ SOLN: 4.0000 mg | Freq: Four times a day (QID) | INTRAMUSCULAR | Status: DC | PRN

## 2017-05-30 MED ORDER — INSULIN GLARGINE 100 UNIT/ML ~~LOC~~ SOLN
15.0000 [IU] | Freq: Every day | SUBCUTANEOUS | Status: DC
  Filled 2017-05-30 (×3): qty 0.15

## 2017-05-30 MED ORDER — MORPHINE SULFATE (PF) 4 MG/ML IV SOLN: 2.0000 mg | INTRAVENOUS | Status: DC | PRN

## 2017-05-30 MED ORDER — SODIUM CHLORIDE 0.9 % IV SOLN
INTRAVENOUS | Status: AC
  Administered 2017-05-31: 01:00:00 via INTRAVENOUS

## 2017-05-30 MED ORDER — SODIUM CHLORIDE 0.9 % IV SOLN
2.0000 g | Freq: Once | INTRAVENOUS | Status: DC
  Filled 2017-05-30: qty 20

## 2017-05-30 MED ORDER — INSULIN ASPART 100 UNIT/ML ~~LOC~~ SOLN
0.0000 [IU] | SUBCUTANEOUS | Status: DC
  Administered 2017-05-31: 5 [IU] via SUBCUTANEOUS
  Administered 2017-05-31 (×3): 2 [IU] via SUBCUTANEOUS

## 2017-05-30 MED ORDER — CEFTRIAXONE SODIUM 2 G IJ SOLR: 2.0000 g | INTRAMUSCULAR | Status: DC

## 2017-05-30 MED ORDER — ACETAMINOPHEN 325 MG PO TABS
ORAL_TABLET | ORAL | Status: AC
Start: 2017-05-30 — End: 2017-05-31
  Filled 2017-05-30: qty 2

## 2017-05-30 MED ORDER — ACETAMINOPHEN 650 MG RE SUPP: 650.0000 mg | Freq: Four times a day (QID) | RECTAL | Status: DC | PRN

## 2017-05-30 MED ORDER — IOPAMIDOL (ISOVUE-300) INJECTION 61%
100.0000 mL | Freq: Once | INTRAVENOUS | Status: AC | PRN
  Administered 2017-05-30: 100 mL via INTRAVENOUS

## 2017-05-30 MED ORDER — LEVOTHYROXINE SODIUM 100 MCG IV SOLR
50.0000 ug | Freq: Every day | INTRAVENOUS | Status: DC
  Administered 2017-05-31: 50 ug via INTRAVENOUS
  Filled 2017-05-30 (×2): qty 5

## 2017-05-30 MED ORDER — SODIUM CHLORIDE 0.9 % IV SOLN: 1.0000 g | INTRAVENOUS | Status: DC

## 2017-05-30 MED ORDER — SODIUM CHLORIDE 0.9 % IV SOLN
1.0000 g | Freq: Once | INTRAVENOUS | Status: AC
  Administered 2017-05-30: 1 g via INTRAVENOUS
  Filled 2017-05-30: qty 10

## 2017-05-30 MED ORDER — ONDANSETRON HCL 4 MG PO TABS
4.0000 mg | ORAL_TABLET | Freq: Four times a day (QID) | ORAL | Status: DC | PRN
Start: 2017-05-30 — End: 2017-06-01

## 2017-05-30 NOTE — ED Provider Notes (Signed)
[facility] EMERGENCY DEPARTMENT Provider Note   CSN: [id number] Arrival date & time: 05/30/17  1516     History   Chief Complaint Chief Complaint  Patient presents with  . Abdominal Pain    HPI Beverly Schwartz is a 47 y.o. female with history of hypertension, diabetes, hypothyroidism who presents with a 3-day history of left sided abdominal pain.  She has had associated diarrhea.  She reports she has occasional blood in her stool from hemorrhoids, however has actually noticed less of this since her symptom onset.  She had associated nausea, but no vomiting.  She has felt hot and cold at home, but no documented fever.  Patient does have a fever 100.4 here.  She denies any new back pain, urinary symptoms, vaginal discharge or bleeding, chest pain, shortness of breath.  LMP 05/22/2017.  Patient is never had a colonoscopy.  She has no history of diverticulitis.  She took pain reliever last night without relief.  She had an ultrasound of her liver in mid to late 2018 which she reports showed fatty liver, and she was referred for CT abdomen pelvis, however she did not follow-up because she was nervous.  HPI  Past Medical History:  Diagnosis Date  . Anxiety   . Chest pain   . Depression   . Diabetes mellitus   . Dizziness   . Hyperlipidemia   . Hypertension   . Hypothyroidism   . Obesity   . Tachycardia     Patient Active Problem List   Diagnosis Date Noted  . Healthcare maintenance 12/10/2016  . Elevated LFTs 12/10/2016  . Generalized abdominal pain 09/30/2016  . Anxiety 09/30/2016  . Sinusitis 09/03/2016  . Diabetes mellitus without complication ([place]) 54/65/6812  . Hypothyroid 07/07/2016  . Hypertension 07/07/2016  . Hyperlipidemia 08/06/2010  . Tachycardia   . Chest pain   . Dizziness   . Obesity     Past Surgical History:  Procedure Laterality Date  . WISDOM TOOTH EXTRACTION     age 69's    OB History    No data available       Home Medications     Prior to Admission medications   Medication Sig Start Date End Date Taking? Authorizing Provider  ALPRAZolam Duanne Moron) 1 MG tablet Take 1 tablet (1 mg total) by mouth daily as needed for anxiety. 09/30/16   Danford, Valetta Fuller D, NP  atorvastatin (LIPITOR) 10 MG tablet Take 1 tablet (10 mg total) by mouth daily. Patient not taking: Reported on 12/10/2016 09/15/16   Mina Marble D, NP  CINNAMON PO Take by mouth.    [provider]  fluticasone (FLONASE) 50 MCG/ACT nasal spray Place 1 spray into both nostrils daily. 12/10/16   Danford, Valetta Fuller D, NP  hyoscyamine (LEVSIN SL) 0.125 MG SL tablet Place 1 tablet (0.125 mg total) under the tongue every 6 (six) hours as needed. 10/23/16   Esterwood, Amy S, PA-C  insulin NPH-regular Human (NOVOLIN 70/30) (70-30) 100 UNIT/ML injection Inject 25 Units into the skin 2 (two) times daily with a meal. Use 25 units in the morning and 15 units in the evening     [provider]  levothyroxine (SYNTHROID, LEVOTHROID) 112 MCG tablet Take 112 mcg by mouth daily before breakfast.    [provider]  losartan (COZAAR) 50 MG tablet Take 1 tablet (50 mg total) by mouth daily. 12/10/16   Danford, Valetta Fuller D, NP  Omega-3 Fatty Acids (FISH OIL) 1000 MG CAPS Take by mouth  daily.    [provider]  TURMERIC PO Take by mouth.    [provider]    Family History Family History  Problem Relation Age of Onset  . Heart disease Father   . Healthy Mother   . Hyperlipidemia Sister   . Healthy Brother   . Heart disease Paternal Uncle   . Heart attack Paternal Uncle   . Healthy Son   . Breast cancer Maternal Grandmother   . Colon cancer Neg Hx   . Esophageal cancer Neg Hx   . Rectal cancer Neg Hx   . Liver cancer Neg Hx     Social History Social History   Tobacco Use  . Smoking status: Never Smoker  . Smokeless tobacco: Never Used  Substance Use Topics  . Alcohol use: No  . Drug use: No     Allergies   Amoxicillin   Review of  Systems Review of Systems  Constitutional: Positive for chills and fever.  HENT: Negative for facial swelling and sore throat.   Respiratory: Negative for shortness of breath.   Cardiovascular: Negative for chest pain.  Gastrointestinal: Positive for abdominal pain and diarrhea. Negative for nausea and vomiting.  Genitourinary: Negative for dysuria, vaginal bleeding and vaginal discharge.  Musculoskeletal: Positive for back pain (chronic, none new).  Skin: Negative for rash and wound.  Neurological: Negative for headaches.  Psychiatric/Behavioral: The patient is not nervous/anxious.      Physical Exam Updated Vital Signs BP (!) 166/105   Pulse (!) 108   Temp 98.3 F (36.8 C) (Oral)   Resp (!) 21   Ht _0  (1.651 m)   Wt 72.6 kg (160 lb)   LMP 05/22/2017   SpO2 99%   BMI 26.63 kg/m   Physical Exam  Constitutional: She appears well-developed and well-nourished. No distress.  HENT:  Head: Normocephalic and atraumatic.  Mouth/Throat: Oropharynx is clear and moist. No oropharyngeal exudate.  Eyes: Conjunctivae are normal. Pupils are equal, round, and reactive to light. Right eye exhibits no discharge. Left eye exhibits no discharge. No scleral icterus.  Neck: Normal range of motion. Neck supple. No thyromegaly present.  Cardiovascular: Normal rate, regular rhythm, normal heart sounds and intact distal pulses. Exam reveals no gallop and no friction rub.  No murmur heard. Pulmonary/Chest: Effort normal and breath sounds normal. No stridor. No respiratory distress. She has no wheezes. She has no rales.  Abdominal: Soft. Bowel sounds are normal. She exhibits no distension. There is tenderness in the epigastric area, left upper quadrant and left lower quadrant. There is no rigidity, no rebound, no guarding, no CVA tenderness, no tenderness at McBurney's point and negative Murphy's sign.  Musculoskeletal: She exhibits no edema.  Lymphadenopathy:    She has no cervical adenopathy.    Neurological: She is alert. Coordination normal.  Skin: Skin is warm and dry. No rash noted. She is not diaphoretic. No pallor.  Psychiatric: She has a normal mood and affect.  Nursing note and vitals reviewed.    ED Treatments / Results  Labs (all labs ordered are listed, but only abnormal results are displayed) Labs Reviewed  COMPREHENSIVE METABOLIC PANEL - Abnormal; Notable for the following components:      Result Value   Potassium 3.4 (*)    Glucose, Bld 197 (*)    Calcium 7.8 (*)    Total Protein 8.4 (*)    Alkaline Phosphatase 174 (*)    Total Bilirubin 0.2 (*)    All other components within  normal limits  CBC - Abnormal; Notable for the following components:   WBC 14.1 (*)    Hemoglobin 10.2 (*)    HCT 33.9 (*)    MCV 73.1 (*)    MCH 22.0 (*)    RDW 16.0 (*)    All other components within normal limits  URINALYSIS, ROUTINE W REFLEX MICROSCOPIC - Abnormal; Notable for the following components:   APPearance CLOUDY (*)    Bilirubin Urine SMALL (*)    Ketones, ur 15 (*)    Protein, ur 30 (*)    All other components within normal limits  URINALYSIS, MICROSCOPIC (REFLEX) - Abnormal; Notable for the following components:   Bacteria, UA MANY (*)    Squamous Epithelial / LPF 6-30 (*)    All other components within normal limits  LIPASE, BLOOD  PREGNANCY, URINE  MAGNESIUM  PHOSPHORUS  INFLUENZA PANEL BY PCR (TYPE A & B)  I-STAT CG4 LACTIC ACID, ED    EKG  EKG Interpretation None       Radiology Ct Abdomen Pelvis W Contrast  Result Date: 05/30/2017 CLINICAL DATA:  Left lower quadrant pain. Symptoms for 3 days. Elevated white blood cell count. Diabetes. Hypertension. Diverticulitis suspected. EXAM: CT ABDOMEN AND PELVIS WITH CONTRAST TECHNIQUE: Multidetector CT imaging of the abdomen and pelvis was performed using the standard protocol following bolus administration of intravenous contrast. CONTRAST:  113m ISOVUE-300 IOPAMIDOL (ISOVUE-300) INJECTION 61%  COMPARISON:  Ultrasound of 06/09/2016. FINDINGS: Lower chest: Clear lung bases. Normal heart size without pericardial or pleural effusion. Hepatobiliary: Hepatomegaly at 22.6 cm craniocaudal. Large volume bilateral hepatic masses. Index lateral segment left liver lobe lesion measures 5.4 x 5.6 cm on image 12/2. Dominant segment 4 lesion measures 8.1 x 6.5 cm. Normal gallbladder, without biliary ductal dilatation. Pancreas: Normal, without mass or ductal dilatation. Spleen: Normal in size, without focal abnormality. Adrenals/Urinary Tract: Normal adrenal glands. Normal kidneys, without hydronephrosis. Normal urinary bladder. Stomach/Bowel: Normal stomach, without wall thickening. Irregular moderate rectosigmoid wall thickening, including on image 70/2. Sagittal image 57/6. Large stool burden more proximally, suggesting a component of obstruction. Normal terminal ileum and appendix. Normal small bowel. Vascular/Lymphatic: Mild but age advanced aortic atherosclerosis. Portacaval node of 2.0 cm on image 26/2. Adenopathy within the sigmoid mesocolon, including at 8 mm on image 63/2 and 11 mm on image 64/2. Reproductive: The uterus is positioned eccentric left. No adnexal mass. Other: No significant free fluid. No evidence of omental or peritoneal disease. Musculoskeletal: Transitional S1 vertebral body. IMPRESSION: 1. 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 Abdominopelvic adenopathy. Rectosigmoid mass with suggestion of partial obstruction as evidenced by large colonic stool burden more proximally. 2.  Aortic Atherosclerosis (ICD10-I70.0).  This is age advanced. Electronically Signed   By: KAbigail MiyamotoM.D.   On: 05/30/2017 17:19    Procedures Procedures (including critical care time)  Medications Ordered in ED Medications  sodium chloride 0.9 % bolus 1,000 mL (0 mLs Intravenous Stopped 05/30/17 1913)  ketorolac (TORADOL) 30 MG/ML injection 15 mg  (15 mg Intravenous Given 05/30/17 1616)  iopamidol (ISOVUE-300) 61 % injection 100 mL (100 mLs Intravenous Contrast Given 05/30/17 1643)  potassium chloride SA (K-DUR,KLOR-CON) CR tablet 40 mEq (40 mEq Oral Given 05/30/17 1958)  calcium gluconate 1 g in sodium chloride 0.9 % 100 mL IVPB (1 g Intravenous New Bag/Given 05/30/17 1958)  sodium chloride 0.9 % bolus 1,000 mL (1,000 mLs Intravenous New Bag/Given 05/30/17 1958)     Initial Impression / Assessment and Plan /  ED Course  I have reviewed the triage vital signs and the nursing notes.  Pertinent labs & imaging results that were available during my care of the patient were reviewed by me and considered in my medical decision making (see chart for details).     Patient presenting with 3-day history of diarrhea and left lower quadrant pain, although left lower quadrant pain has been intermittent over the past few months.  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  Rectosigmoid mass with suggestion of partial obstruction as evidenced by large colonic stool burden of proximally, abdominopelvic adenopathy.  Patient with WBC 14.1, hemoglobin 10.2.  CMP shows potassium 3.4, alk phos 174, calcium 7.8.  EKG shows sinus tachycardia, prolonged QT with 495 QTC.  Patient is mildly febrile, but lactic within normal limits per Dr. Regenia Skeeter spoke with Dr. Hilarie Fredrickson with gastroenterology who advised admission to by hospitalist for further workup.  Plans for flexsig tomorrow.  Dr. Regenia Skeeter also spoke with Dr. Roel Cluck with [facility] who will admit the patient at [facility].  Patient understands and agrees with plan.  Patient was evaluated by Dr. Regenia Skeeter who guided the patient's management and agrees with plan.   Final Clinical Impressions(s) / ED Diagnoses   Final diagnoses:  Rectosigmoid cancer [facility])    ED Discharge Orders    None         Frederica Kuster, PA-C 05/30/17 2027    Sherwood Gambler, MD 05/31/17 [phone]

## 2017-05-30 NOTE — Plan of Care (Addendum)
85 F HxofDM2,HTN, HLD, hypothyroid,  Comes w 3 days of diarrhea ad pain found to have partial obstruction due to new metastatic recto sigmoid cancer. With liver mets.  ER discussed with Dr.Pyrtle will see in AM  K 3.4  Ca 7.8 Asked to replace  Asked for influenza panel  Noted to be tachycardic up to 120's  Asked for additional IV fluids and reasses  Admit to tele bed as inpatient if vitals are stable  Beverly Schwartz 7:23 PM  Reassessed HR coming down but low grade fever admit to tele bed  ER states patient appears stable  Beverly Schwartz 9:13 PM

## 2017-05-30 NOTE — ED Notes (Signed)
CT will need to wait for BUN and Creat to result prior to imaging with IV contrast, hx DM, per [facility] radiology protocol

## 2017-05-30 NOTE — ED Notes (Signed)
ED Provider at bedside. 

## 2017-05-30 NOTE — ED Notes (Signed)
No changes, alert, amiable, NAD, calm, no dyspnea, VSS. [facility] here.

## 2017-05-30 NOTE — ED Notes (Signed)
Patient transported to CT 

## 2017-05-30 NOTE — ED Notes (Signed)
Back from b/r, steady gait, no changes, NAD, calm.

## 2017-05-30 NOTE — H&P (Signed)
Beverly Schwartz OVZ:[id number] DOB: 1970/07/09 DOA: 05/30/2017     PCP: Esaw Grandchild, NP   Outpatient Specialists: none  Patient coming from:  home Lives  With family    Chief Complaint:abdominal pain  HPI: Beverly Schwartz is a 47 y.o. female with medical history significant of hypertension, diabetes, hypothyroidism, and anxiety, depression, obesity    Presented with 3-day history of abdominal pain and diarrhea.  Reports left lower quadrant abdominal pain she has been having occasional blood in his stool but felt was secondary to history of hemorrhoids.  Renal endorses nausea but had not vomited.  Reports subjective fevers at home but she did not check.  Denies dysuria no chest pain or shortness of breath.  Patient have not had colonoscopy Prior history of abnormal liver ultrasound 1 year ago  she was supposed to follow-up with CT of abdomen and pelvis but did not do so because she was worried about what is going to find.   reports weight loss 30 lb but some of it was intentional.   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 Hilarie Fredrickson who recommends: Flex sigmoidoscopy tomorrow We'll see patient in consult in the morning     IN ER:  Temp (24hrs), Avg:99.2 F (37.3 C), Min:98.1 F (36.7 C), Max:100.4 F (38 C)      on arrival  ED Triage Vitals  Enc Vitals Group     BP 05/30/17 1523 (!) 180/120     Pulse Rate 05/30/17 1523 (!) 130     Resp 05/30/17 1523 20     Temp 05/30/17 1523 (!) 100.4 F (38 C)     Temp Source  05/30/17 1523 Oral     SpO2 05/30/17 1523 99 %     Weight 05/30/17 1522 160 lb (72.6 kg)     Height 05/30/17 1522 5\' 5"  (1.651 m)     Head Circumference --      Peak Flow --      Pain Score 05/30/17 1521 4     Pain Loc --      Pain Edu? --      Excl. in [place]? --     Latest RR 20 sat 99% pulse 108 BP 170/105  Following Medications were ordered in ER: Medications  insulin aspart (novoLOG) injection 0-9 Units (not administered)  sodium chloride 0.9 % bolus 1,000 mL (0 mLs Intravenous Stopped 05/30/17 1913)  ketorolac (TORADOL) 30 MG/ML injection 15 mg (15 mg Intravenous Given 05/30/17 1616)  iopamidol (ISOVUE-300) 61 % injection 100 mL (100 mLs Intravenous Contrast Given 05/30/17 1643)  potassium chloride SA (K-DUR,KLOR-CON) CR tablet 40 mEq (40 mEq Oral Given 05/30/17 1958)  calcium gluconate 1 g in sodium chloride 0.9 % 100 mL IVPB (0 g Intravenous Stopped 05/30/17 2029)  sodium chloride 0.9 % bolus 1,000 mL (0 mLs Intravenous Stopped 05/30/17 2041)  acetaminophen (TYLENOL) tablet 650 mg (650 mg Oral Given 05/30/17 2047)      Hospitalist was called for admission for new diagnosis of rectosigmoid mass with metastatic spread to the liver   Regarding pertinent Chronic problems: History  of diabetes on 70 30 25 units in Am and 15 at night History of hypertension on Cozaar Review of Systems:    Pertinent positives include: Fevers, chills, fatigue, abdominal pain, nausea, diarrhea, Constitutional:  No weight loss, night sweats, , weight loss  HEENT:  No headaches, Difficulty swallowing,Tooth/dental problems,Sore throat,  No sneezing, itching, ear ache, nasal congestion, post nasal drip,  Cardio-vascular:  No chest pain, Orthopnea, PND, anasarca, dizziness, palpitations.no Bilateral lower extremity swelling  GI:  No heartburn, indigestion,  vomiting,  change in bowel habits, loss of appetite, melena, blood in stool, hematemesis Resp:  no shortness of breath at rest. No dyspnea on exertion, No  excess mucus, no productive cough, No non-productive cough, No coughing up of blood.No change in color of mucus.No wheezing. Skin:  no rash or lesions. No jaundice GU:  no dysuria, change in color of urine, no urgency or frequency. No straining to urinate.  No flank pain.  Musculoskeletal:  No joint pain or no joint swelling. No decreased range of motion. No back pain.  Psych:  No change in mood or affect. No depression or anxiety. No memory loss.  Neuro: no localizing neurological complaints, no tingling, no weakness, no double vision, no gait abnormality, no slurred speech, no confusion  As per HPI otherwise 10 point review of systems negative.   Past Medical History: Past Medical History:  Diagnosis Date  . Anxiety   . Chest pain   . Depression   . Diabetes mellitus   . Dizziness   . Hyperlipidemia   . Hypertension   . Hypothyroidism   . Obesity   . Tachycardia    Past Surgical History:  Procedure Laterality Date  . WISDOM TOOTH EXTRACTION     age 46's     Social History:  Ambulatory  independently      reports that  has never smoked. she has never used smokeless tobacco. She reports that she does not drink alcohol or use drugs.  Allergies:   Allergies  Allergen Reactions  . Amoxicillin Rash     Family History:   Family History  Problem Relation Age of Onset  . Heart disease Father   . Healthy Mother   . Hyperlipidemia Sister   . Healthy Brother   . Heart disease Paternal Uncle   . Heart attack Paternal Uncle   . Healthy Son   . Breast cancer Maternal Grandmother   . Colon cancer Neg Hx   . Esophageal cancer Neg Hx   . Rectal cancer Neg Hx   . Liver cancer Neg Hx     Medications: Prior to Admission medications   Medication Sig Start Date End Date Taking? Authorizing Provider  ALPRAZolam Duanne Moron) 1 MG tablet Take 1 tablet (1 mg total) by mouth daily as needed for anxiety. 09/30/16   Danford, Valetta Fuller D, NP  atorvastatin (LIPITOR) 10 MG tablet Take 1  tablet (10 mg total) by mouth daily. Patient not taking: Reported on 12/10/2016 09/15/16   Mina Marble D, NP  CINNAMON PO Take by mouth.    [provider]  fluticasone (FLONASE) 50 MCG/ACT nasal spray Place 1 spray into both nostrils daily. 12/10/16   Danford, Valetta Fuller D, NP  hyoscyamine (LEVSIN SL) 0.125 MG SL tablet Place 1 tablet (0.125 mg total) under the tongue every 6 (six) hours as needed. 10/23/16   Esterwood, Amy S, PA-C  insulin NPH-regular Human (NOVOLIN 70/30) (70-30) 100 UNIT/ML injection Inject 25 Units into the skin 2 (two) times daily with  a meal. Use 25 units in the morning and 15 units in the evening     [provider]  levothyroxine (SYNTHROID, LEVOTHROID) 112 MCG tablet Take 112 mcg by mouth daily before breakfast.    [provider]  losartan (COZAAR) 50 MG tablet Take 1 tablet (50 mg total) by mouth daily. 12/10/16   Danford, Valetta Fuller D, NP  Omega-3 Fatty Acids (FISH OIL) 1000 MG CAPS Take by mouth daily.    [provider]  TURMERIC PO Take by mouth.    [provider]    Physical Exam: Patient Vitals for the past 24 hrs:  BP Temp Temp src Pulse Resp SpO2 Height Weight  05/30/17 2246 (!) 170/105 99.2 F (37.3 C) Oral (!) 108 20 99 % 5\' 5"  (1.651 m) 72.6 kg (160 lb)  05/30/17 2200 - - - (!) 119 - 100 % - -  05/30/17 2155 - - - (!) 115 - 97 % - -  05/30/17 2150 - - - (!) 116 - 96 % - -  05/30/17 2130 (!) 158/105 - - (!) 113 19 98 % - -  05/30/17 2042 - 100.2 F (37.9 C) Tympanic - - - - -  05/30/17 2042 - 99.1 F (37.3 C) Oral - - - - -  05/30/17 2030 (!) 167/109 - - (!) 112 15 100 % - -  05/30/17 2028 (!) 160/108 - - (!) 108 (!) 6 98 % - -  05/30/17 2000 (!) 166/105 - - (!) 108 (!) 21 99 % - -  05/30/17 1930 (!) 161/107 98.3 F (36.8 C) Oral (!) 114 12 99 % - -  05/30/17 1930 (!) 161/107 - - (!) 111 (!) 21 99 % - -  05/30/17 1753 (!) 166/120 98.1 F (36.7 C) Oral (!) 120 20 100 % - -  05/30/17 1602 (!) 171/100 - - (!) 114 18  100 % - -  05/30/17 1523 (!) 180/120 (!) 100.4 F (38 C) Oral (!) 130 20 99 % - -  05/30/17 1522 - - - - - - 5\' 5"  (1.651 m) 72.6 kg (160 lb)    1. General:  in No Acute distress  well  -appearing 2. Psychological: Alert and   Oriented 3. Head/ENT:      Dry Mucous Membranes                          Head Non traumatic, neck supple                          Normal   Dentition 4. SKIN:   decreased Skin turgor,  Skin clean Dry and intact no rash 5. Heart: Regular rate and rhythm no  Murmur, no Rub or gallop 6. Lungs: Clear to auscultation bilaterally, no wheezes or crackles   7. Abdomen: Soft,  non-tender, Non distended  Obese  8. Lower extremities: no clubbing, cyanosis, or edema 9. Neurologically Grossly intact, moving all 4 extremities equally  10. MSK: Normal range of motion   body mass index is 26.63 kg/m.  Labs on Admission:   Labs on Admission: I have personally reviewed following labs and imaging studies  CBC: Recent Labs  Lab 05/30/17 1525  WBC 14.1*  HGB 10.2*  HCT 33.9*  MCV 73.1*  PLT 355   Basic Metabolic Panel: Recent Labs  Lab 05/30/17 1525  NA 138  K 3.4*  CL 102  CO2 25  GLUCOSE 197*  BUN 10  CREATININE 0.72  CALCIUM 7.8*  MG 2.2  PHOS 3.8   GFR: Estimated Creatinine Clearance: 89.5 mL/min (by C-G formula based on SCr of 0.72 mg/dL). Liver Function Tests: Recent Labs  Lab 05/30/17 1525  AST 30  ALT 16  ALKPHOS 174*  BILITOT 0.2*  PROT 8.4*  ALBUMIN 4.0   Recent Labs  Lab 05/30/17 1525  LIPASE 23   No results for input(s): AMMONIA in the last 168 hours. Coagulation Profile: No results for input(s): INR, PROTIME in the last 168 hours. Cardiac Enzymes: No results for input(s): CKTOTAL, CKMB, CKMBINDEX, TROPONINI in the last 168 hours. BNP (last 3 results) No results for input(s): PROBNP in the last 8760 hours. HbA1C: No results for input(s): HGBA1C in the last 72 hours. CBG: No results for input(s): GLUCAP in the last 168  hours. Lipid Profile: No results for input(s): CHOL, HDL, LDLCALC, TRIG, CHOLHDL, LDLDIRECT in the last 72 hours. Thyroid Function Tests: No results for input(s): TSH, T4TOTAL, FREET4, T3FREE, THYROIDAB in the last 72 hours. Anemia Panel: No results for input(s): VITAMINB12, FOLATE, FERRITIN, TIBC, IRON, RETICCTPCT in the last 72 hours. Urine analysis:    Component Value Date/Time   COLORURINE YELLOW 05/30/2017 1525   APPEARANCEUR CLOUDY (A) 05/30/2017 1525   LABSPEC 1.025 05/30/2017 1525   PHURINE 6.0 05/30/2017 1525   GLUCOSEU NEGATIVE 05/30/2017 1525   HGBUR NEGATIVE 05/30/2017 1525   BILIRUBINUR SMALL (A) 05/30/2017 1525   KETONESUR 15 (A) 05/30/2017 1525   PROTEINUR 30 (A) 05/30/2017 1525   NITRITE NEGATIVE 05/30/2017 1525   LEUKOCYTESUR NEGATIVE 05/30/2017 1525   Sepsis Labs: @LABRCNTIP (procalcitonin:4,lacticidven:4) )No results found for this or any previous visit (from the past 240 hour(s)).    UA  evidence of UTI   Lab Results  Component Value Date   HGBA1C 8.4 09/30/2016    Estimated Creatinine Clearance: 89.5 mL/min (by C-G formula based on SCr of 0.72 mg/dL).  BNP (last 3 results) No results for input(s): PROBNP in the last 8760 hours.   ECG REPORT  Independently reviewed Rate: 108  Rhythm: Sinus tachycardia ST&T Change: No acute ischemic changes   QTC 495  Filed Weights   05/30/17 1522 05/30/17 2246  Weight: 72.6 kg (160 lb) 72.6 kg (160 lb)     Cultures: No results found for: SDES, SPECREQUEST, CULT, REPTSTATUS   Radiological Exams on Admission: Ct Abdomen Pelvis W Contrast  Result Date: 05/30/2017 CLINICAL DATA:  Left lower quadrant pain. Symptoms for 3 days. Elevated white blood cell count. Diabetes. Hypertension. Diverticulitis suspected. EXAM: CT ABDOMEN AND PELVIS WITH CONTRAST TECHNIQUE: Multidetector CT imaging of the abdomen and pelvis was performed using the standard protocol following bolus administration of intravenous contrast.  CONTRAST:  115mL ISOVUE-300 IOPAMIDOL (ISOVUE-300) INJECTION 61% COMPARISON:  Ultrasound of 06/09/2016. FINDINGS: Lower chest: Clear lung bases. Normal heart size without pericardial or pleural effusion. Hepatobiliary: Hepatomegaly at 22.6 cm craniocaudal. Large volume bilateral hepatic masses. Index lateral segment left liver lobe lesion measures 5.4 x 5.6 cm on image 12/2. Dominant segment 4 lesion measures 8.1 x 6.5 cm. Normal gallbladder, without biliary ductal dilatation. Pancreas: Normal, without mass or ductal dilatation. Spleen: Normal in size, without focal abnormality. Adrenals/Urinary Tract: Normal adrenal glands. Normal kidneys, without hydronephrosis. Normal urinary bladder. Stomach/Bowel: Normal stomach, without wall thickening. Irregular moderate rectosigmoid wall thickening, including on image 70/2. Sagittal image 57/6. Large stool burden more proximally, suggesting a component of obstruction. Normal terminal ileum and appendix. Normal small bowel. Vascular/Lymphatic:  Mild but age advanced aortic atherosclerosis. Portacaval node of 2.0 cm on image 26/2. Adenopathy within the sigmoid mesocolon, including at 8 mm on image 63/2 and 11 mm on image 64/2. Reproductive: The uterus is positioned eccentric left. No adnexal mass. Other: No significant free fluid. No evidence of omental or peritoneal disease. Musculoskeletal: Transitional S1 vertebral body. IMPRESSION: 1. 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 Abdominopelvic adenopathy. Rectosigmoid mass with suggestion of partial obstruction as evidenced by large colonic stool burden more proximally. 2.  Aortic Atherosclerosis (ICD10-I70.0).  This is age advanced. Electronically Signed   By: Abigail Miyamoto M.D.   On: 05/30/2017 17:19    Chart has been reviewed    Assessment/Plan  47 y.o. female with medical history significant of hypertension, diabetes, hypothyroidism, and anxiety,  depression, obesity  Admitted for new diagnosis of rectosigmoid colon mass with metastatic spread to the liver and SIRS  Present on Admission: . SIRS (systemic inflammatory response syndrome) (HCC) unclear source multiple metastatic spread to the liver, Unclear source  But large metastasis  Could contribute.  UA possible contaminant versus await results of blood and urine culture for now treat for presumed UTI, will order CXR to evaluate for source and help with staging.  Marland Kitchen Hypertension unable to take p.o. medications secondary to possible obstruction treat severe hypertension with as needed labetalol . Hypothyroid check TSH Synthroid IV . Hyperlipidemia stable resume home medications when able to tolerate . Generalized abdominal pain in the setting of heavy stool burden and possible obstruction secondary to colonic mass Flex sigmoidoscopy tomorrow, bowel rest for now. May need surgical consult due to obstruction.  Possible UTI -  will initiate Rocephin culture Dm 2-  - Order Sensitive SSI   -  switch to   Lantus 15 units,  -  check TSH and HgA1C  - Hold by mouth medications   Other plan as per orders.  DVT prophylaxis:  SCD    Code Status:  FULL CODE as per patient    Family Communication:   Family   at  Bedside  plan of care was discussed with  mother  Disposition Plan:       To home once workup is complete and patient is stable                              Consults called: GI Dr.Pyrtle    Admission status: inpatient      Level of care      tele             I have spent a total of 56 min on this admission   Joel Mericle 05/31/2017, 3:08 AM    [facility]  Pager [phone]   after 2 AM please page floor coverage PA If 7AM-7PM, please contact the day team taking care of the patient  [email or web address]  Password [email or web address]

## 2017-05-30 NOTE — ED Notes (Signed)
Pending bed assignment, actively investigating.

## 2017-05-30 NOTE — ED Triage Notes (Signed)
LLQ abd pain with diarrhea x 3 days.

## 2017-05-30 NOTE — ED Notes (Addendum)
Back from b/r, steady gait. Alert, NAD, calm, interactive, resps e/u, speaking in clear complete sentences, no dyspnea noted, skin W&D, VSS, (denies: pain, sob, nausea, dizziness or visual changes), EDP into room discussing results, dx, and plan. Family at [facility].

## 2017-05-30 NOTE — ED Notes (Signed)
Called [facility] like to have admitting MD paged.

## 2017-05-30 NOTE — ED Notes (Addendum)
Back from b/r, no changes, resting comfortably, updated.

## 2017-05-30 NOTE — ED Notes (Signed)
Pt amb to [facility]

## 2017-05-31 ENCOUNTER — Encounter (HOSPITAL_COMMUNITY)
Admission: EM | Disposition: A | Discharge status: Home / Self Care | Payer: Self-pay | Source: Home / Self Care | Attending: Internal Medicine

## 2017-05-31 ENCOUNTER — Inpatient Hospital Stay (HOSPITAL_COMMUNITY): Payer: BLUE CROSS/BLUE SHIELD

## 2017-05-31 ENCOUNTER — Encounter (HOSPITAL_COMMUNITY): Payer: Self-pay | Admitting: *Deleted

## 2017-05-31 DIAGNOSIS — D509 Iron deficiency anemia, unspecified: Secondary | ICD-10-CM

## 2017-05-31 DIAGNOSIS — Z794 Long term (current) use of insulin: Secondary | ICD-10-CM

## 2017-05-31 HISTORY — PX: FLEXIBLE SIGMOIDOSCOPY: SHX5431

## 2017-05-31 LAB — CBC
HCT: 29.7 % — ABNORMAL LOW (ref 36.0–46.0)
Hemoglobin: 8.6 g/dL — ABNORMAL LOW (ref 12.0–15.0)
MCH: 21.6 pg — ABNORMAL LOW (ref 26.0–34.0)
MCHC: 29 g/dL — ABNORMAL LOW (ref 30.0–36.0)
MCV: 74.4 fL — ABNORMAL LOW (ref 78.0–100.0)
Platelets: 295 10*3/uL (ref 150–400)
RBC: 3.99 MIL/uL (ref 3.87–5.11)
RDW: 15.9 % — ABNORMAL HIGH (ref 11.5–15.5)
WBC: 9.1 10*3/uL (ref 4.0–10.5)

## 2017-05-31 LAB — GLUCOSE, CAPILLARY
Glucose-Capillary: 153 mg/dL — ABNORMAL HIGH (ref 65–99)
Glucose-Capillary: 130 mg/dL — ABNORMAL HIGH (ref 65–99)
Glucose-Capillary: 149 mg/dL — ABNORMAL HIGH (ref 65–99)
Glucose-Capillary: 194 mg/dL — ABNORMAL HIGH (ref 65–99)
Glucose-Capillary: 273 mg/dL — ABNORMAL HIGH (ref 65–99)

## 2017-05-31 LAB — COMPREHENSIVE METABOLIC PANEL
ALT: 13 U/L — ABNORMAL LOW (ref 14–54)
AST: 23 U/L (ref 15–41)
Albumin: 3.2 g/dL — ABNORMAL LOW (ref 3.5–5.0)
Alkaline Phosphatase: 141 U/L — ABNORMAL HIGH (ref 38–126)
Anion gap: 10 (ref 5–15)
BUN: 5 mg/dL — ABNORMAL LOW (ref 6–20)
CO2: 24 mmol/L (ref 22–32)
Calcium: 7 mg/dL — ABNORMAL LOW (ref 8.9–10.3)
Chloride: 109 mmol/L (ref 101–111)
Creatinine, Ser: 0.6 mg/dL (ref 0.44–1.00)
GFR calc Af Amer: >60 mL/min (ref >60)
GFR calc non Af Amer: >60 mL/min (ref >60)
Glucose, Bld: 173 mg/dL — ABNORMAL HIGH (ref 65–99)
Potassium: 3.4 mmol/L — ABNORMAL LOW (ref 3.5–5.1)
Sodium: 143 mmol/L (ref 135–145)
Total Bilirubin: 0.4 mg/dL (ref 0.3–1.2)
Total Protein: 6.8 g/dL (ref 6.5–8.1)

## 2017-05-31 LAB — HEMOGLOBIN A1C
Hgb A1c MFr Bld: 9 % — ABNORMAL HIGH (ref 4.8–5.6)
Mean Plasma Glucose: 211.6 mg/dL

## 2017-05-31 LAB — PHOSPHORUS: Phosphorus: 3.4 mg/dL (ref 2.5–4.6)

## 2017-05-31 LAB — TSH: TSH: 1.272 u[IU]/mL (ref 0.350–4.500)

## 2017-05-31 LAB — APTT: aPTT: 35 seconds (ref 24–36)

## 2017-05-31 LAB — HIV ANTIBODY (ROUTINE TESTING W REFLEX): HIV Screen 4th Generation wRfx: NONREACTIVE

## 2017-05-31 LAB — PROTIME-INR
INR: 1.05
Prothrombin Time: 13.6 seconds (ref 11.4–15.2)

## 2017-05-31 LAB — PROCALCITONIN: Procalcitonin: <0.1 ng/mL

## 2017-05-31 LAB — LACTIC ACID, PLASMA
Lactic Acid, Venous: 0.7 mmol/L (ref 0.5–1.9)
Lactic Acid, Venous: 0.9 mmol/L (ref 0.5–1.9)

## 2017-05-31 LAB — MAGNESIUM: Magnesium: 1.9 mg/dL (ref 1.7–2.4)

## 2017-05-31 SURGERY — SIGMOIDOSCOPY, FLEXIBLE
Anesthesia: Moderate Sedation

## 2017-05-31 MED ORDER — POLYETHYLENE GLYCOL 3350 17 G PO PACK
17.0000 g | PACK | Freq: Every day | ORAL | Status: DC
  Administered 2017-06-01: 17 g via ORAL
  Filled 2017-05-31: qty 1

## 2017-05-31 MED ORDER — SODIUM CHLORIDE 0.9 % IJ SOLN
INTRAMUSCULAR | Status: AC
  Filled 2017-05-31: qty 50

## 2017-05-31 MED ORDER — FENTANYL CITRATE (PF) 100 MCG/2ML IJ SOLN
INTRAMUSCULAR | Status: AC
  Filled 2017-05-31: qty 2

## 2017-05-31 MED ORDER — SODIUM CHLORIDE 0.9 % IV SOLN
INTRAVENOUS | Status: DC
  Administered 2017-05-31: 19:00:00 via INTRAVENOUS

## 2017-05-31 MED ORDER — ONDANSETRON HCL 4 MG/2ML IJ SOLN
INTRAMUSCULAR | Status: AC
  Filled 2017-05-31: qty 2

## 2017-05-31 MED ORDER — FENTANYL CITRATE (PF) 100 MCG/2ML IJ SOLN
INTRAMUSCULAR | Status: DC | PRN
  Administered 2017-05-31 (×2): 25 ug via INTRAVENOUS

## 2017-05-31 MED ORDER — SODIUM CHLORIDE 0.9 % IV SOLN: 1.0000 g | INTRAVENOUS | Status: DC

## 2017-05-31 MED ORDER — IOPAMIDOL (ISOVUE-300) INJECTION 61%
INTRAVENOUS | Status: AC
  Administered 2017-05-31: 75 mL
  Filled 2017-05-31: qty 75

## 2017-05-31 MED ORDER — LABETALOL HCL 5 MG/ML IV SOLN
10.0000 mg | INTRAVENOUS | Status: DC | PRN
  Filled 2017-05-31: qty 4

## 2017-05-31 MED ORDER — INSULIN ASPART 100 UNIT/ML ~~LOC~~ SOLN
0.0000 [IU] | Freq: Three times a day (TID) | SUBCUTANEOUS | Status: DC
  Administered 2017-06-01: 3 [IU] via SUBCUTANEOUS

## 2017-05-31 MED ORDER — ALPRAZOLAM 0.5 MG PO TABS
0.5000 mg | ORAL_TABLET | Freq: Two times a day (BID) | ORAL | Status: DC | PRN
  Administered 2017-05-31 (×2): 0.5 mg via ORAL
  Filled 2017-05-31 (×2): qty 1

## 2017-05-31 MED ORDER — MIDAZOLAM HCL 10 MG/2ML IJ SOLN
INTRAMUSCULAR | Status: DC | PRN
  Administered 2017-05-31 (×2): 2 mg via INTRAVENOUS
  Administered 2017-05-31: 1 mg via INTRAVENOUS

## 2017-05-31 MED ORDER — SODIUM CHLORIDE 0.9 % IV SOLN
INTRAVENOUS | Status: DC
  Administered 2017-05-31: 14:00:00 via INTRAVENOUS

## 2017-05-31 MED ORDER — POTASSIUM CHLORIDE CRYS ER 20 MEQ PO TBCR
40.0000 meq | EXTENDED_RELEASE_TABLET | Freq: Once | ORAL | Status: AC
  Administered 2017-05-31: 40 meq via ORAL
  Filled 2017-05-31: qty 2

## 2017-05-31 MED ORDER — ONDANSETRON HCL 4 MG/2ML IJ SOLN
4.0000 mg | Freq: Once | INTRAMUSCULAR | Status: AC
  Administered 2017-05-31: 4 mg via INTRAVENOUS

## 2017-05-31 MED ORDER — MIDAZOLAM HCL 5 MG/ML IJ SOLN
INTRAMUSCULAR | Status: AC
Start: 2017-05-31 — End: ?
  Filled 2017-05-31: qty 2

## 2017-05-31 MED ORDER — SODIUM CHLORIDE 0.9 % IV SOLN
1.0000 g | Freq: Once | INTRAVENOUS | Status: AC
  Administered 2017-05-31: 1 g via INTRAVENOUS
  Filled 2017-05-31: qty 1

## 2017-05-31 MED ORDER — SODIUM CHLORIDE 0.9 % IV SOLN
1.0000 g | INTRAVENOUS | Status: DC
  Administered 2017-05-31: 1 g via INTRAVENOUS
  Filled 2017-05-31: qty 1

## 2017-05-31 NOTE — Consult Note (Signed)
Reason for Consult: Abnormal CT Referring Physician: Hospital team  Beverly Schwartz is an 47 y.o. female.  HPI: Patient seen and examined and her hospital computer chart reviewed and she's only had significant symptoms for a few days but has a history of IBS for a few years and some hemorrhoidal bleeding periodically and recently has been trying to lose weight and has lost about 25 pounds and her family history is negative for any obvious colon cancer or other GI problems and she is aware of the CT finding and we answered all of her questions  Past Medical History:  Diagnosis Date  . Anxiety   . Chest pain   . Complication of anesthesia   . Depression   . Diabetes mellitus   . Dizziness   . Hyperlipidemia   . Hypertension   . Hypothyroidism   . Obesity   . PONV (postoperative nausea and vomiting)   . Tachycardia     Past Surgical History:  Procedure Laterality Date  . WISDOM TOOTH EXTRACTION     age 60's    Family History  Problem Relation Age of Onset  . Heart disease Father   . Healthy Mother   . Hyperlipidemia Sister   . Healthy Brother   . Heart disease Paternal Uncle   . Heart attack Paternal Uncle   . Healthy Son   . Breast cancer Maternal Grandmother   . Colon cancer Neg Hx   . Esophageal cancer Neg Hx   . Rectal cancer Neg Hx   . Liver cancer Neg Hx     Social History:  reports that  has never smoked. she has never used smokeless tobacco. She reports that she does not drink alcohol or use drugs.  Allergies:  Allergies  Allergen Reactions  . Bupropion Other (See Comments)    suicidal ideations  . Amoxicillin Rash    Has patient had a PCN reaction causing immediate rash, facial/tongue/throat swelling, SOB or lightheadedness with hypotension: Yes Has patient had a PCN reaction causing severe rash involving mucus membranes or skin necrosis: No Has patient had a PCN reaction that required hospitalization: No Has patient had a PCN reaction occurring within the  last 10 years: No If all of the above answers are "NO", then may proceed with Cephalosporin use.     Medications: I have reviewed the patient's current medications.  Results for orders placed or performed during the hospital encounter of 05/30/17 (from the past 48 hour(s))  Lipase, blood     Status: None   Collection Time: 05/30/17  3:25 PM  Result Value Ref Range   Lipase 23 11 - 51 U/L    Comment: Performed at [facility], [address] [place], [place] [address]  Comprehensive metabolic panel     Status: Abnormal   Collection Time: 05/30/17  3:25 PM  Result Value Ref Range   Sodium 138 135 - 145 mmol/L   Potassium 3.4 (L) 3.5 - 5.1 mmol/L   Chloride 102 101 - 111 mmol/L   CO2 25 22 - 32 mmol/L   Glucose, Bld 197 (H) 65 - 99 mg/dL   BUN 10 6 - 20 mg/dL   Creatinine, Ser 0.72 0.44 - 1.00 mg/dL   Calcium 7.8 (L) 8.9 - 10.3 mg/dL   Total Protein 8.4 (H) 6.5 - 8.1 g/dL   Albumin 4.0 3.5 - 5.0 g/dL   AST 30 15 - 41 U/L   ALT 16 14 - 54 U/L   Alkaline  Phosphatase 174 (H) 38 - 126 U/L   Total Bilirubin 0.2 (L) 0.3 - 1.2 mg/dL   GFR calc non Af Amer >60 >60 mL/min   GFR calc Af Amer >60 >60 mL/min    Comment: (NOTE) The eGFR has been calculated using the CKD EPI equation. This calculation has not been validated in all clinical situations. eGFR's persistently <60 mL/min signify possible Chronic Kidney Disease.    Anion gap 11 5 - 15    Comment: Performed at [facility], [address] [place], [place] [address]  CBC     Status: Abnormal   Collection Time: 05/30/17  3:25 PM  Result Value Ref Range   WBC 14.1 (H) 4.0 - 10.5 K/uL   RBC 4.64 3.87 - 5.11 MIL/uL   Hemoglobin 10.2 (L) 12.0 - 15.0 g/dL   HCT 33.9 (L) 36.0 - 46.0 %   MCV 73.1 (L) 78.0 - 100.0 fL   MCH 22.0 (L) 26.0 - 34.0 pg   MCHC 30.1 30.0 - 36.0 g/dL   RDW 16.0 (H) 11.5 - 15.5 %   Platelets 396 150 - 400 K/uL    Comment: Performed at [facility], [address] [place], [place] [address]  Urinalysis, Routine w reflex microscopic     Status: Abnormal   Collection Time: 05/30/17  3:25 PM  Result Value Ref Range   Color, Urine YELLOW YELLOW   APPearance CLOUDY (A) CLEAR   Specific Gravity, Urine 1.025 1.005 - 1.030   pH 6.0 5.0 - 8.0   Glucose, UA NEGATIVE NEGATIVE mg/dL   Hgb urine dipstick NEGATIVE NEGATIVE   Bilirubin Urine SMALL (A) NEGATIVE   Ketones, ur 15 (A) NEGATIVE mg/dL   Protein, ur 30 (A) NEGATIVE mg/dL   Nitrite NEGATIVE NEGATIVE   Leukocytes, UA NEGATIVE NEGATIVE    Comment: Performed at [facility], [address] [place], [place] [address]  Pregnancy, urine     Status: None   Collection Time: 05/30/17  3:25 PM  Result Value Ref Range   Preg Test, Ur NEGATIVE NEGATIVE    Comment:        THE SENSITIVITY OF THIS METHODOLOGY IS >20 mIU/mL. Performed at [facility], [address] [place], [place] [address]   Urinalysis, Microscopic (reflex)     Status: Abnormal   Collection Time: 05/30/17  3:25 PM  Result Value Ref Range   RBC / HPF NONE SEEN 0 - 5 RBC/hpf   WBC, UA 0-5 0 - 5 WBC/hpf   Bacteria, UA MANY (A) NONE SEEN   Squamous Epithelial / LPF 6-30 (A) NONE SEEN   Mucus PRESENT    Hyaline Casts, UA PRESENT    Ca Oxalate Crys, UA PRESENT     Comment: Performed at [facility], [address] [place], [place] [address]  Magnesium     Status: None   Collection Time: 05/30/17  3:25 PM  Result Value Ref Range   Magnesium 2.2 1.7 - 2.4 mg/dL    Comment: Performed at [facility], [address] [place], [place] [address]  Phosphorus     Status: None   Collection Time: 05/30/17  3:25 PM  Result Value Ref Range   Phosphorus 3.8 2.5 - 4.6 mg/dL    Comment: Performed at [facility], [address] [place], [place] [address]  I-Stat CG4 Lactic Acid, ED  Status: None   Collection Time: 05/30/17  4:03 PM  Result Value Ref Range   Lactic Acid, Venous 1.73 0.5 - 1.9 mmol/L   Culture, blood (x 2)     Status: None (Preliminary result)   Collection Time: 05/30/17  4:13 PM  Result Value Ref Range   Specimen Description      BLOOD LEFT ARM Performed at [place] [address], [place], [place] [address]    Special Requests      BOTTLES DRAWN AEROBIC AND ANAEROBIC Blood Culture adequate volume Performed at [facility], [address] [place], [place] [address]    Culture PENDING    Report Status PENDING   Influenza panel by PCR (type A & B)     Status: None   Collection Time: 05/30/17  8:07 PM  Result Value Ref Range   Influenza A By PCR NEGATIVE NEGATIVE   Influenza B By PCR NEGATIVE NEGATIVE    Comment: (NOTE) The Xpert Xpress Flu assay is intended as an aid in the diagnosis of  influenza and should not be used as a sole basis for treatment.  This  assay is FDA approved for nasopharyngeal swab specimens only. Nasal  washings and aspirates are unacceptable for Xpert Xpress Flu testing. Performed at [facility], [place] [address], [place], [place] [address]   Glucose, capillary     Status: Abnormal   Collection Time: 05/30/17 11:41 PM  Result Value Ref Range   Glucose-Capillary 166 (H) 65 - 99 mg/dL  APTT     Status: None   Collection Time: 05/30/17 11:50 PM  Result Value Ref Range   aPTT 35 24 - 36 seconds    Comment: Performed at [facility], [place] [address] [place], [place] [address]  Protime-INR     Status: None   Collection Time: 05/30/17 11:50 PM  Result Value Ref Range   Prothrombin Time 13.6 11.4 - 15.2 seconds   INR 1.05     Comment: Performed at [facility], [place] [address], [place], [place] [address]  Procalcitonin     Status: None   Collection Time: 05/30/17 11:50 PM  Result Value Ref Range   Procalcitonin <0.10 ng/mL    Comment:        Interpretation: PCT (Procalcitonin) <= 0.5 ng/mL: Systemic infection (sepsis) is not likely. Local bacterial infection is  possible. (NOTE)       Sepsis PCT Algorithm           Lower Respiratory Tract                                      Infection PCT Algorithm    ----------------------------     ----------------------------         PCT < 0.25 ng/mL                PCT < 0.10 ng/mL         Strongly encourage             Strongly discourage   discontinuation of antibiotics    initiation of antibiotics    ----------------------------     -----------------------------       PCT 0.25 - 0.50 ng/mL            PCT 0.10 - 0.25 ng/mL  OR       >80% decrease in PCT            Discourage initiation of                                            antibiotics      Encourage discontinuation           of antibiotics    ----------------------------     -----------------------------         PCT >= 0.50 ng/mL              PCT 0.26 - 0.50 ng/mL               AND        <80% decrease in PCT             Encourage initiation of                                             antibiotics       Encourage continuation           of antibiotics    ----------------------------     -----------------------------        PCT >= 0.50 ng/mL                  PCT > 0.50 ng/mL               AND         increase in PCT                  Strongly encourage                                      initiation of antibiotics    Strongly encourage escalation           of antibiotics                                     -----------------------------                                           PCT <= 0.25 ng/mL                                                 OR                                        > 80% decrease in PCT                                     Discontinue / Do not initiate                                               antibiotics Performed at [facility], [place] [address] [place], [place] [address]   Lactic acid, plasma     Status: None   Collection Time: 05/30/17 11:50 PM  Result Value Ref Range   Lactic Acid,  Venous 0.9 0.5 - 1.9 mmol/L    Comment: Performed at [facility], [place] [address], [place], [place] [address]  Lactic acid, plasma     Status: None   Collection Time: 05/31/17  3:01 AM  Result Value Ref Range   Lactic Acid, Venous 0.7 0.5 - 1.9 mmol/L    Comment: Performed at [facility], [place] [address] [place], [place] [address]  Hemoglobin A1c     Status: Abnormal   Collection Time: 05/31/17  3:01 AM  Result Value Ref Range   Hgb A1c MFr Bld 9.0 (H) 4.8 - 5.6 %    Comment: (NOTE) Pre diabetes:          5.7%-6.4% Diabetes:              >6.4% Glycemic control for   <7.0% adults with diabetes    Mean Plasma Glucose 211.6 mg/dL    Comment: Performed at [place] [address], [place], [place] [address]  Magnesium     Status: None   Collection Time: 05/31/17  3:01 AM  Result Value Ref Range   Magnesium 1.9 1.7 - 2.4 mg/dL    Comment: Performed at [facility], [place] [address] [place], [place] [address]  Phosphorus     Status: None   Collection Time: 05/31/17  3:01 AM  Result Value Ref Range   Phosphorus 3.4 2.5 - 4.6 mg/dL    Comment: Performed at [facility], [place] [address] [place], [place] [address]  TSH     Status: None   Collection Time: 05/31/17  3:01 AM  Result Value Ref Range   TSH 1.272 0.350 - 4.500 uIU/mL    Comment: Performed by a 3rd Generation assay with a functional sensitivity of <=0.01 uIU/mL. Performed at [facility], [place] [address], [place],  [address]   Comprehensive metabolic panel     Status: Abnormal   Collection Time: 05/31/17  3:01 AM  Result Value Ref Range   Sodium 143 135 - 145 mmol/L   Potassium 3.4 (L) 3.5 - 5.1 mmol/L   Chloride 109 101 - 111 mmol/L   CO2 24 22 - 32 mmol/L   Glucose, Bld 173 (H) 65 - 99 mg/dL   BUN 5 (L) 6 - 20 mg/dL   Creatinine, Ser 0.60 0.44 - 1.00 mg/dL   Calcium 7.0 (L) 8.9 - 10.3 mg/dL   Total Protein  6.8 6.5 - 8.1 g/dL   Albumin 3.2 (L) 3.5 - 5.0 g/dL   AST 23 15 - 41 U/L   ALT 13 (L) 14 - 54 U/L   Alkaline Phosphatase 141 (H) 38 - 126 U/L   Total Bilirubin 0.4 0.3 - 1.2 mg/dL   GFR calc non Af Amer >60 >60 mL/min   GFR calc Af Amer >60 >60 mL/min    Comment: (NOTE) The eGFR has been calculated using the CKD EPI equation. This calculation has not been validated in all clinical situations. eGFR's persistently <60 mL/min signify possible Chronic Kidney Disease.    Anion gap 10 5 - 15    Comment: Performed at [facility], [place] [address] [place],  [address]  CBC  Status: Abnormal   Collection Time: 05/31/17  3:01 AM  Result Value Ref Range   WBC 9.1 4.0 - 10.5 K/uL   RBC 3.99 3.87 - 5.11 MIL/uL   Hemoglobin 8.6 (L) 12.0 - 15.0 g/dL   HCT 29.7 (L) 36.0 - 46.0 %   MCV 74.4 (L) 78.0 - 100.0 fL   MCH 21.6 (L) 26.0 - 34.0 pg   MCHC 29.0 (L) 30.0 - 36.0 g/dL   RDW 15.9 (H) 11.5 - 15.5 %   Platelets 295 150 - 400 K/uL    Comment: Performed at [facility], [place] [address] [place], [place] [address]  Glucose, capillary     Status: Abnormal   Collection Time: 05/31/17  5:08 AM  Result Value Ref Range   Glucose-Capillary 153 (H) 65 - 99 mg/dL  Glucose, capillary     Status: Abnormal   Collection Time: 05/31/17  7:40 AM  Result Value Ref Range   Glucose-Capillary 130 (H) 65 - 99 mg/dL  Glucose, capillary     Status: Abnormal   Collection Time: 05/31/17 11:41 AM  Result Value Ref Range   Glucose-Capillary 149 (H) 65 - 99 mg/dL    Dg Chest 2 View  Result Date: 05/31/2017 CLINICAL DATA:  Fever EXAM: CHEST - 2 VIEW COMPARISON:  None. FINDINGS: Heart and mediastinal contours are within normal limits. No focal opacities or effusions. No acute bony abnormality. IMPRESSION: No active cardiopulmonary disease. Electronically Signed   By: Rolm Baptise M.D.   On: 05/31/2017 09:10   Ct Abdomen Pelvis W Contrast  Result Date:  05/30/2017 CLINICAL DATA:  Left lower quadrant pain. Symptoms for 3 days. Elevated white blood cell count. Diabetes. Hypertension. Diverticulitis suspected. EXAM: CT ABDOMEN AND PELVIS WITH CONTRAST TECHNIQUE: Multidetector CT imaging of the abdomen and pelvis was performed using the standard protocol following bolus administration of intravenous contrast. CONTRAST:  116m ISOVUE-300 IOPAMIDOL (ISOVUE-300) INJECTION 61% COMPARISON:  Ultrasound of 06/09/2016. FINDINGS: Lower chest: Clear lung bases. Normal heart size without pericardial or pleural effusion. Hepatobiliary: Hepatomegaly at 22.6 cm craniocaudal. Large volume bilateral hepatic masses. Index lateral segment left liver lobe lesion measures 5.4 x 5.6 cm on image 12/2. Dominant segment 4 lesion measures 8.1 x 6.5 cm. Normal gallbladder, without biliary ductal dilatation. Pancreas: Normal, without mass or ductal dilatation. Spleen: Normal in size, without focal abnormality. Adrenals/Urinary Tract: Normal adrenal glands. Normal kidneys, without hydronephrosis. Normal urinary bladder. Stomach/Bowel: Normal stomach, without wall thickening. Irregular moderate rectosigmoid wall thickening, including on image 70/2. Sagittal image 57/6. Large stool burden more proximally, suggesting a component of obstruction. Normal terminal ileum and appendix. Normal small bowel. Vascular/Lymphatic: Mild but age advanced aortic atherosclerosis. Portacaval node of 2.0 cm on image 26/2. Adenopathy within the sigmoid mesocolon, including at 8 mm on image 63/2 and 11 mm on image 64/2. Reproductive: The uterus is positioned eccentric left. No adnexal mass. Other: No significant free fluid. No evidence of omental or peritoneal disease. Musculoskeletal: Transitional S1 vertebral body. IMPRESSION: 1. 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 Abdominopelvic adenopathy. Rectosigmoid mass with suggestion of partial obstruction  as evidenced by large colonic stool burden more proximally. 2.  Aortic Atherosclerosis (ICD10-I70.0).  This is age advanced. Electronically Signed   By: KAbigail MiyamotoM.D.   On: 05/30/2017 17:19    ROS negative except above Blood pressure (!) 162/94, pulse (!) 101, temperature 98.7 F (37.1 C), temperature source Oral, resp. rate 12, height 5' 5" (1.651 m), weight 72.6  kg (160 lb), last menstrual period 05/22/2017, SpO2 100 %. Physical Exam vital signs stable afebrile no acute distress slightly anxious exam please see preassessment evaluation labs and CT reviewed  Assessment/Plan: Abnormal CT worrisome for rectosigmoid tumor with metastases to the liver Plan: The risk benefits methods of flexible sigmoidoscopy was discussed with the patient and will proceed this afternoon with further workup and plans pending those findings if nothing is seen will need liver biopsy tomorrow however if lesion seen will need oncology consult ASAP to help decide if any further workup plans or surgical consult is needed  [facility] E 05/31/2017, 1:51 PM

## 2017-05-31 NOTE — Op Note (Signed)
[facility] Patient Name: Beverly Schwartz Procedure Date: 05/31/2017 MRN: [id number] Attending MD: Clarene Essex , MD Date of Birth: [birth year] CSN: [id number] Age: 47 Admit Type: Inpatient Procedure:                Flexible Sigmoidoscopy Indications:              Abnormal CT of the GI tract worrisome for a                            rectosigmoid mass Providers:                Clarene Essex, MD, Vista Lawman, RN, Laurena Spies,                            Technician, Nevin Bloodgood, Technician Referring MD:              Medicines:                Fentanyl 50 micrograms IV, Midazolam 5 mg IV Complications:            No immediate complications. Estimated Blood Loss:     Estimated blood loss: none. Procedure:                Pre-Anesthesia Assessment:                           - Prior to the procedure, a History and Physical                            was performed, and patient medications and                            allergies were reviewed. The patient's tolerance of                            previous anesthesia was also reviewed. The risks                            and benefits of the procedure and the sedation                            options and risks were discussed with the patient.                            All questions were answered, and informed consent                            was obtained. Prior Anticoagulants: The patient has                            taken no previous anticoagulant or antiplatelet                            agents. ASA Grade Assessment: II - A patient with  mild systemic disease. After reviewing the risks                            and benefits, the patient was deemed in                            satisfactory condition to undergo the procedure.                           After obtaining informed consent, the scope was                            passed under direct vision. The EC-[id number] ([id number])               scope was introduced through the anus and advanced                            to the the sigmoid colon. The flexible                            sigmoidoscopy was accomplished without difficulty.                            The patient tolerated the procedure well. The                            quality of the bowel preparation was adequate. Scope In: 2:06:31 PM Scope Out: 2:14:20 PM Total Procedure Duration: 0 hours 7 minutes 49 seconds  Findings:      A small polyp was found in the rectum. The polyp was semi-sessile.      The exam was otherwise without abnormality.      An infiltrative and ulcerated partially obstructing medium-sized mass       was found in the recto-sigmoid colon. The mass was circumferential. The       mass measured four cm in length. No bleeding was present. Biopsies were       taken with a cold forceps for histology.      A medium polyp was found in the proximal sigmoid colon. The polyp was       semi-sessile.      The exam was otherwise without abnormality. in exam to 35 cm      Internal hemorrhoids were found during retroflexion, during perianal       exam and during digital exam. The hemorrhoids were small. Impression:               - One small polyp in the rectum.                           - The examination was otherwise normal.                           - Malignant partially obstructing tumor in the                            recto-sigmoid colon. Biopsied.                           -  One medium polyp in the proximal sigmoid colon.                           - The examination was otherwise normal.                           - Internal hemorrhoids. Moderate Sedation:      Moderate (conscious) sedation was administered by the endoscopy nurse       and supervised by the endoscopist. The following parameters were       monitored: oxygen saturation, heart rate, blood pressure, respiratory       rate, EKG, adequacy of pulmonary ventilation, and response to  care. Recommendation:           - Patient has a contact number available for                            emergencies. The signs and symptoms of potential                            delayed complications were discussed with the                            patient. Return to normal activities tomorrow.                            Written discharge instructions were provided to the                            patient.                           - Soft diet today. Could probably go home today if                            oncology appointment made for this week                           - Continue present medications.                           - Await pathology results. Will send ASAP                           - Return to GI clinic PRN. Please let us know if we                            could be of any further assistance with this                            hospital stay                           - Telephone GI clinic for pathology results in 3  days.                           - Telephone GI clinic if symptomatic PRN.                           - Refer to an oncologist asap. They will decide                            whether any further workup or plans as needed and                            whether surgical consultation for primary resection                            as needed although I would use MiraLAX and keep her                            stools very loose to pass partial obstruction Procedure Code(s):        --- Professional ---                           [id number], Sigmoidoscopy, flexible; with biopsy, single                            or multiple Diagnosis Code(s):        --- Professional ---                           K62.1, Rectal polyp                           D12.5, Benign neoplasm of sigmoid colon                           C19, Malignant neoplasm of rectosigmoid junction                           R93.3, Abnormal findings on diagnostic imaging of                             other parts of digestive tract CPT copyright 2016 [employer]. All rights reserved. The codes documented in this report are preliminary and upon coder review may  be revised to meet current compliance requirements. Clarene Essex, MD 05/31/2017 2:28:56 PM This report has been signed electronically. Number of Addenda: 0

## 2017-05-31 NOTE — Progress Notes (Signed)
Pharmacy: ceftriaxone  Patient's a 47 y.o F with hx metastatic recto sigmoid cancer presented to the ED on 05/31/17 woth c/o diarrhea and abd pain.  To start ceftriaxone for suspected UTI.  Of note patient has PCN alllergy (rash).  Instructed RN to monitor pt closely with dose dose of ceftriaxone.  Plan: - Ceftriaxone 1 gm IV q24h - No renal adjustment is needed with Ceftriaxone -- pharmacy will sign off - Re-consult [place] if need further assistance  Thank you for asking pharmacy to be part of this patient's care.  Dia Sitter, PharmD, BCPS 05/31/2017 12:10 AM

## 2017-05-31 NOTE — Progress Notes (Signed)
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 Biopsied. -GI recommended to " use MiraLAX and keep her stools very loose to pass partial obstruction" and oncology consult -Case discussed with oncology Dr. Earlie Server who will arrange close follow-up.  Dr. Julien Nordmann also recommended CT chest with IV contrast for cancer staging, I have also ordered AFP and CEA level. -hgb 10 on presentation, 8.6 this am, Repeat CBC, may need PRBC transfusion if continued trending down, anemia work up.  Possible sepsis presented on presentation -Presented with fever 100.4, sinus tachycardia heart rate 130, leukocytosis WBC 14, possible UTI -blood culture obtained in the ER, She is started on Rocephin, unfortunately urine culture was not obtained prior to antibiotic, -Add on a urine culture, continue Rocephin/IVF for now  Hypokalemia , replace K,   Insulin-dependent type 2 diabetes A1c 9 Per home med list she is on Novolin 70/30 25 units twice daily She is started on lower dose of long-acting insulin here, she is also on SSI  HTN:  She does not want to take losartan any more due to recent recall She want something else, consider lisinopril if bp elevated, in the setting of diabetes.  HLD;  Continue statin and fish oil  Hypothyroidism continue Synthroid    Code Status: full  Family Communication: patient   Disposition Plan: home tomorrow if If hgb stable. Blood culture  negative.   Consultants:  GI Dr Watt Climes  Case discussed with oncology Dr Julien Nordmann  Procedures: Flexible Sigmoidoscopy with biopsy of sigmoid colon mass by Dr Watt Climes on 3/10.   Antibiotics:  rocephin   Objective: BP (!) 141/80 (BP Location: Left Arm)   Pulse 99   Temp 98.4 F (36.9 C) (Oral)   Resp (!) 21   Ht 5\' 5"  (1.651 m)   Wt 72.6 kg (160 lb)   LMP 05/22/2017   SpO2 99%   BMI 26.63 kg/m   Intake/Output Summary (Last 24 hours) at 05/31/2017 1257 Last data filed at 05/31/2017 1157 Gross per 24 hour  Intake 2453.75 ml  Output 0 ml  Net 2453.75 ml   Filed Weights   05/30/17 1522 05/30/17 2246  Weight: 72.6 kg (160 lb) 72.6 kg (160 lb)    Exam: Patient is examined daily including today on 05/31/2017, exams remain the same as of yesterday except that has changed    General:  NAD  Cardiovascular: Sinus tachycardia has resolved  Respiratory: CTABL  Abdomen: Soft/ND/NT, positive BS  Musculoskeletal: No Edema  Neuro: alert, oriented   Data Reviewed: Basic Metabolic Panel: Recent Labs  Lab 05/30/17 1525 05/31/17 0301  NA 138 143  K 3.4* 3.4*  CL 102 109  CO2 25 24  GLUCOSE 197* 173*  BUN 10 5*  CREATININE 0.72 0.60  CALCIUM 7.8* 7.0*  MG 2.2 1.9  PHOS 3.8 3.4   Liver Function Tests: Recent Labs  Lab 05/30/17 1525 05/31/17 0301  AST 30 23  ALT 16 13*  ALKPHOS 174* 141*  BILITOT 0.2* 0.4  PROT 8.4* 6.8  ALBUMIN 4.0 3.2*  Recent Labs  Lab 05/30/17 1525  LIPASE 23   No results for input(s): AMMONIA in the last 168 hours. CBC: Recent Labs  Lab 05/30/17 1525 05/31/17 0301  WBC 14.1* 9.1  HGB 10.2* 8.6*  HCT 33.9* 29.7*  MCV 73.1* 74.4*  PLT 396 295   Cardiac Enzymes:   No results for input(s): CKTOTAL, CKMB, CKMBINDEX, TROPONINI in the last 168 hours. BNP (last 3 results) No results for input(s): BNP in the last 8760 hours.  ProBNP (last 3 results) No results for input(s): PROBNP in the last 8760 hours.  CBG: Recent Labs   Lab 05/30/17 2341 05/31/17 0508 05/31/17 0740 05/31/17 1141  GLUCAP 166* 153* 130* 149*    Recent Results (from the past 240 hour(s))  Culture, blood (x 2)     Status: None (Preliminary result)   Collection Time: 05/30/17  4:13 PM  Result Value Ref Range Status   Specimen Description   Final    BLOOD LEFT ARM Performed at [facility] [address], [place], [place] [address]    Special Requests   Final    BOTTLES DRAWN AEROBIC AND ANAEROBIC Blood Culture adequate volume Performed at [facility], [address] [place], [place] [address]    Culture PENDING  Incomplete   Report Status PENDING  Incomplete     Studies: Dg Chest 2 View  Result Date: 05/31/2017 CLINICAL DATA:  Fever EXAM: CHEST - 2 VIEW COMPARISON:  None. FINDINGS: Heart and mediastinal contours are within normal limits. No focal opacities or effusions. No acute bony abnormality. IMPRESSION: No active cardiopulmonary disease. Electronically Signed   By: Rolm Baptise M.D.   On: 05/31/2017 09:10   Ct Abdomen Pelvis W Contrast  Result Date: 05/30/2017 CLINICAL DATA:  Left lower quadrant pain. Symptoms for 3 days. Elevated white blood cell count. Diabetes. Hypertension. Diverticulitis suspected. EXAM: CT ABDOMEN AND PELVIS WITH CONTRAST TECHNIQUE: Multidetector CT imaging of the abdomen and pelvis was performed using the standard protocol following bolus administration of intravenous contrast. CONTRAST:  135mL ISOVUE-300 IOPAMIDOL (ISOVUE-300) INJECTION 61% COMPARISON:  Ultrasound of 06/09/2016. FINDINGS: Lower chest: Clear lung bases. Normal heart size without pericardial or pleural effusion. Hepatobiliary: Hepatomegaly at 22.6 cm craniocaudal. Large volume bilateral hepatic masses. Index lateral segment left liver lobe lesion measures 5.4 x 5.6 cm on image 12/2. Dominant segment 4 lesion measures 8.1 x 6.5 cm. Normal gallbladder, without biliary ductal dilatation. Pancreas: Normal, without mass or ductal  dilatation. Spleen: Normal in size, without focal abnormality. Adrenals/Urinary Tract: Normal adrenal glands. Normal kidneys, without hydronephrosis. Normal urinary bladder. Stomach/Bowel: Normal stomach, without wall thickening. Irregular moderate rectosigmoid wall thickening, including on image 70/2. Sagittal image 57/6. Large stool burden more proximally, suggesting a component of obstruction. Normal terminal ileum and appendix. Normal small bowel. Vascular/Lymphatic: Mild but age advanced aortic atherosclerosis. Portacaval node of 2.0 cm on image 26/2. Adenopathy within the sigmoid mesocolon, including at 8 mm on image 63/2 and 11 mm on image 64/2. Reproductive: The uterus is positioned eccentric left. No adnexal mass. Other: No significant free fluid. No evidence of omental or peritoneal disease. Musculoskeletal: Transitional S1 vertebral body. IMPRESSION: 1. 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 Abdominopelvic adenopathy. Rectosigmoid mass with suggestion of partial obstruction as evidenced by large colonic stool burden more proximally. 2.  Aortic Atherosclerosis (ICD10-I70.0).  This is age advanced. Electronically Signed   By: Abigail Miyamoto M.D.   On: 05/30/2017 17:19  Scheduled Meds: . insulin aspart  0-9 Units Subcutaneous Q4H  . insulin glargine  15 Units Subcutaneous QHS  . levothyroxine  50 mcg Intravenous Daily    Continuous Infusions: . cefTRIAXone (ROCEPHIN)  IV       Time spent: 52mins I have personally reviewed and interpreted on  05/31/2017 daily labs, tele strips, imagings as discussed above under date review session and assessment and plans.  I reviewed all nursing notes, pharmacy notes, consultant notes,  vitals, pertinent old records  I have discussed plan of care as described above with RN , patient  on 05/31/2017   Florencia Reasons MD, PhD  [facility] Pager [phone]. If 7PM-7AM, please contact  night-coverage at [email or web address], password [facility] 05/31/2017, 12:57 PM  LOS: 1 day

## 2017-05-31 NOTE — Progress Notes (Signed)
Patient received 1024ml tap water enema x1. Patient did not tolerate enema well as she was only able to take in/retain about 150-266ml at a time. Results started as watery brown stool, then had two very small pieces of formed stool, then it was mostly clear with small amounts of brown flecks. Showed last results to Endo RN and she stated that it appeared to be clear enough. Endo RN waiting to take patient for flex sigmoid. Consent filled out and placed on chart, but not signed as the GI MD has not explained the procedure to the patient.

## 2017-06-01 ENCOUNTER — Encounter (HOSPITAL_COMMUNITY): Payer: Self-pay | Admitting: Gastroenterology

## 2017-06-01 DIAGNOSIS — N390 Urinary tract infection, site not specified: Secondary | ICD-10-CM

## 2017-06-01 DIAGNOSIS — C787 Secondary malignant neoplasm of liver and intrahepatic bile duct: Principal | ICD-10-CM

## 2017-06-01 DIAGNOSIS — A419 Sepsis, unspecified organism: Secondary | ICD-10-CM

## 2017-06-01 DIAGNOSIS — C189 Malignant neoplasm of colon, unspecified: Secondary | ICD-10-CM

## 2017-06-01 LAB — CBC WITH DIFFERENTIAL/PLATELET
Basophils Absolute: 0.1 10*3/uL (ref 0.0–0.1)
Basophils Relative: 1 %
Eosinophils Absolute: 0.3 10*3/uL (ref 0.0–0.7)
Eosinophils Relative: 3 %
HCT: 28.5 % — ABNORMAL LOW (ref 36.0–46.0)
Hemoglobin: 8.6 g/dL — ABNORMAL LOW (ref 12.0–15.0)
Lymphocytes Relative: 17 %
Lymphs Abs: 1.4 10*3/uL (ref 0.7–4.0)
MCH: 21.7 pg — ABNORMAL LOW (ref 26.0–34.0)
MCHC: 30.2 g/dL (ref 30.0–36.0)
MCV: 71.8 fL — ABNORMAL LOW (ref 78.0–100.0)
Monocytes Absolute: 0.3 10*3/uL (ref 0.1–1.0)
Monocytes Relative: 3 %
Neutro Abs: 6.3 10*3/uL (ref 1.7–7.7)
Neutrophils Relative %: 76 %
Platelets: 265 10*3/uL (ref 150–400)
RBC: 3.97 MIL/uL (ref 3.87–5.11)
RDW: 15.5 % (ref 11.5–15.5)
WBC: 8.4 10*3/uL (ref 4.0–10.5)

## 2017-06-01 LAB — COMPREHENSIVE METABOLIC PANEL
ALT: 12 U/L — ABNORMAL LOW (ref 14–54)
AST: 20 U/L (ref 15–41)
Albumin: 3 g/dL — ABNORMAL LOW (ref 3.5–5.0)
Alkaline Phosphatase: 133 U/L — ABNORMAL HIGH (ref 38–126)
Anion gap: 9 (ref 5–15)
BUN: 6 mg/dL (ref 6–20)
CO2: 24 mmol/L (ref 22–32)
Calcium: 7.6 mg/dL — ABNORMAL LOW (ref 8.9–10.3)
Chloride: 105 mmol/L (ref 101–111)
Creatinine, Ser: 0.65 mg/dL (ref 0.44–1.00)
GFR calc Af Amer: >60 mL/min (ref >60)
GFR calc non Af Amer: >60 mL/min (ref >60)
Glucose, Bld: 200 mg/dL — ABNORMAL HIGH (ref 65–99)
Potassium: 3.6 mmol/L (ref 3.5–5.1)
Sodium: 138 mmol/L (ref 135–145)
Total Bilirubin: 0 mg/dL — ABNORMAL LOW (ref 0.3–1.2)
Total Protein: 6.7 g/dL (ref 6.5–8.1)

## 2017-06-01 LAB — URINE CULTURE: Culture: NO GROWTH

## 2017-06-01 LAB — VITAMIN B12: Vitamin B-12: 587 pg/mL (ref 180–914)

## 2017-06-01 LAB — TYPE AND SCREEN
ABO/RH(D): O POS
Antibody Screen: NEGATIVE

## 2017-06-01 LAB — IRON AND TIBC
Iron: 22 ug/dL — ABNORMAL LOW (ref 28–170)
Saturation Ratios: 6 % — ABNORMAL LOW (ref 10.4–31.8)
TIBC: 339 ug/dL (ref 250–450)
UIBC: 317 ug/dL

## 2017-06-01 LAB — GLUCOSE, CAPILLARY: Glucose-Capillary: 223 mg/dL — ABNORMAL HIGH (ref 65–99)

## 2017-06-01 LAB — MAGNESIUM: Magnesium: 2 mg/dL (ref 1.7–2.4)

## 2017-06-01 LAB — RETICULOCYTES
RBC.: 3.97 MIL/uL (ref 3.87–5.11)
Retic Count, Absolute: 59.6 10*3/uL (ref 19.0–186.0)
Retic Ct Pct: 1.5 % (ref 0.4–3.1)

## 2017-06-01 LAB — FOLATE: Folate: 11.7 ng/mL (ref >5.9)

## 2017-06-01 LAB — ABO/RH: ABO/RH(D): O POS

## 2017-06-01 MED ORDER — POLYETHYLENE GLYCOL 3350 17 G PO PACK: 17.0000 g | PACK | Freq: Every day | ORAL | 0 refills | Status: AC

## 2017-06-01 MED ORDER — PROPRANOLOL HCL 10 MG PO TABS
10.0000 mg | ORAL_TABLET | Freq: Every day | ORAL | Status: DC
  Administered 2017-06-01: 10 mg via ORAL
  Filled 2017-06-01: qty 1

## 2017-06-01 MED ORDER — LEVOTHYROXINE SODIUM 112 MCG PO TABS: 112.0000 ug | ORAL_TABLET | Freq: Every day | ORAL | Status: DC

## 2017-06-01 MED ORDER — CEFPODOXIME PROXETIL 200 MG PO TABS: 200.0000 mg | ORAL_TABLET | Freq: Two times a day (BID) | ORAL | 0 refills | Status: AC

## 2017-06-01 MED ORDER — PROPRANOLOL HCL 10 MG PO TABS: 10.0000 mg | ORAL_TABLET | Freq: Every day | ORAL | 0 refills | Status: DC

## 2017-06-01 NOTE — Care Management Note (Signed)
Case Management Note  Patient Details  Name: Beverly Schwartz MRN: [id number] Date of Birth: [birth year]  Subjective/Objective: 47 y/o f admitted w/SIRS. From home No CM needs.                   Action/Plan:d/c home.   Expected Discharge Date:  06/01/17               Expected Discharge Plan:  Home/Self Care  In-House Referral:     Discharge planning Services  CM Consult  Post Acute Care Choice:    Choice offered to:     DME Arranged:    DME Agency:     HH Arranged:    HH Agency:     Status of Service:  Completed, signed off  If discussed at [employer] of Stay Meetings, dates discussed:    Additional Comments:  Dessa Phi, RN 06/01/2017, 10:47 AM

## 2017-06-01 NOTE — Discharge Summary (Addendum)
Discharge Summary  Beverly Schwartz JME:[id number] DOB: [birth year]  PCP: Mina Marble D, NP  Admit date: 05/30/2017 Discharge date: 06/01/2017  Time spent: <65mins  Recommendations for Outpatient Follow-up:  1. F/u with PMD within a week  for hospital discharge follow up, repeat cbc/bmp at follow up. pmd to continue titrate bp meds. 2. F/u with oncology in one week, cea/afp/pathology in process at discharge.  Discharge Diagnoses:  Active Hospital Problems   Diagnosis Date Noted  . SIRS (systemic inflammatory response syndrome) ([place]) 05/30/2017  . Generalized abdominal pain 09/30/2016  . Hypertension 07/07/2016  . Hypothyroid 07/07/2016  . Diabetes mellitus without complication ([place]) 22/97/9892  . Hyperlipidemia 08/06/2010    Resolved Hospital Problems  No resolved problems to display.    Discharge Condition: stable  Diet recommendation: heart healthy/carb modified  Filed Weights   05/30/17 1522 05/30/17 2246  Weight: 72.6 kg (160 lb) 72.6 kg (160 lb)    History of present illness: (per admitting MD Dr Roel Cluck) PCP: Esaw Grandchild, NP   Outpatient Specialists: none  Patient coming from:  home Lives  With family    Chief Complaint:abdominal pain  HPI: Beverly Schwartz is a 47 y.o. female with medical history significant of hypertension, diabetes, hypothyroidism, and anxiety, depression, obesity    Presented with 3-day history of abdominal pain and diarrhea.  Reports left lower quadrant abdominal pain she has been having occasional blood in his stool but felt was secondary to history of hemorrhoids.  Renal endorses nausea but had not vomited.  Reports subjective fevers at home but she did not check.  Denies dysuria no chest pain or shortness of breath.  Patient have not had colonoscopy Prior history of abnormal liver ultrasound 1 year ago  she was supposed to follow-up with CT of abdomen and pelvis but did not do so because she was worried about what is going to  find.   reports weight loss 30 lb but some of it was intentional.   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 Hilarie Fredrickson who recommends: Flex sigmoidoscopy tomorrow We'll see patient in consult in the morning     IN ER:  Temp (24hrs), Avg:99.2 F (37.3 C), Min:98.1 F (36.7 C), Max:100.4 F (38 C)      on arrival      ED Triage Vitals  Enc Vitals Group     BP 05/30/17 1523 (!) 180/120     Pulse Rate 05/30/17 1523 (!) 130     Resp 05/30/17 1523 20     Temp 05/30/17 1523 (!) 100.4 F (38 C)     Temp Source 05/30/17 1523 Oral     SpO2 05/30/17 1523 99 %     Weight 05/30/17 1522 160 lb (72.6 kg)     Height 05/30/17 1522 5\' 5"  (1.651 m)     Head Circumference --      Peak Flow --      Pain Score 05/30/17 1521 4     Pain Loc --      Pain Edu? --      Excl. in [place]? --  Latest RR 20 sat 99% pulse 108 BP 170/105  Following Medications were ordered in ER: Medications  insulin aspart (novoLOG) injection 0-9 Units (not administered)  sodium chloride 0.9 % bolus 1,000 mL (0 mLs Intravenous Stopped 05/30/17 1913)  ketorolac (TORADOL) 30 MG/ML injection 15 mg (15 mg Intravenous Given 05/30/17 1616)  iopamidol (ISOVUE-300) 61 % injection 100 mL (100 mLs Intravenous Contrast Given 05/30/17 1643)  potassium chloride SA (K-DUR,KLOR-CON) CR tablet 40 mEq (40 mEq Oral Given 05/30/17 1958)  calcium gluconate 1 g in sodium chloride 0.9 % 100 mL IVPB (0 g Intravenous Stopped 05/30/17 2029)  sodium chloride 0.9 % bolus 1,000 mL (0 mLs  Intravenous Stopped 05/30/17 2041)  acetaminophen (TYLENOL) tablet 650 mg (650 mg Oral Given 05/30/17 2047)      Hospitalist was called for admission for new diagnosis of rectosigmoid mass with metastatic spread to the liver   Regarding pertinent Chronic problems: History of diabetes on 70 30 25 units in Am and 15 at night History of hypertension on Cozaar     Hospital Course:  Active Problems:   Hyperlipidemia   Diabetes mellitus without complication (HCC)   Hypothyroid   Hypertension   Generalized abdominal pain   SIRS (systemic inflammatory response syndrome) (HCC)   GI bleed, microcytic anemia, rectal mass, weight loss -Flexible sigmoidscope showed Malignant partially obstructing tumor in the recto-sigmoid colon. Biopsied. -GI recommended to " use MiraLAX and keep her stools very loose to pass partial obstruction" and oncology consult -Case discussed with oncology Dr. Earlie Server who will arrange close follow-up.  Dr. Julien Nordmann also recommended CT chest with IV contrast for cancer staging, Ct chest no mets. AFP and CEA level pending at discharge. -hgb 10 on presentation, hgb stable at  8.6  For two days, hematology and pcp to continue monitor bleeding and hgb.  sepsis presented on presentation -Presented with fever 100.4, sinus tachycardia heart rate 130, leukocytosis WBC 14, possible UTI -blood culture obtained in the ER no growth, urine culture no growth but is was obtained after starting abx -She is treated withRocephin in the hospital, she is discharged on vantin to finish total of 5 days treatment for presumed uti. -fever /leukocytosis resolved.  Hypokalemia , replaced K,   Insulin-dependent type 2 diabetes A1c 9 Per home med list she is on Novolin 70/30 25 units twice daily She is started on lower dose of long-acting insulin here, she is also on SSI. Resume home insulin at discharge.  HTN:  She does not want to take losartan any more due to recent recall She  want something else, she is started on low dose of prapranolol for due to HTN, tachycardia and anxiety history. Consider adding low dose lisinopril if her blood pressure is not well controlled by prapranolol ( h/o diabetes) pmd to follow up on this.  HLD;  Continue statin and fish oil  Hypothyroidism continue Synthroid  Anxiety: continue home meds xanax prn, she is started on propranolol at discharge.    Code Status: full  Family Communication: patient and mother at bedside  Disposition Plan: home    Consultants:  GI Dr Watt Climes  Case discussed with oncology Dr Julien Nordmann  Procedures: Flexible Sigmoidoscopy with biopsy of sigmoid colon mass by Dr Watt Climes on 3/10.   Antibiotics:  rocephin   Discharge Exam: BP 136/80 (BP Location: Right Arm)   Pulse 98   Temp 97.9 F (36.6 C) (Oral)   Resp 18   Ht 5\' 5"  (1.651 m)   Wt 72.6 kg (  160 lb)   LMP 05/22/2017   SpO2 98%   BMI 26.63 kg/m   General: NAD, pleasant Cardiovascular: RRR Respiratory: CTABL  Discharge Instructions You were cared for by a hospitalist during your hospital stay. If you have any questions about your discharge medications or the care you received while you were in the hospital after you are discharged, you can call the unit and asked to speak with the hospitalist on call if the hospitalist that took care of you is not available. Once you are discharged, your primary care physician will handle any further medical issues. Please note that NO REFILLS for any discharge medications will be authorized once you are discharged, as it is imperative that you return to your primary care physician (or establish a relationship with a primary care physician if you do not have one) for your aftercare needs so that they can reassess your need for medications and monitor your lab values.  Discharge Instructions    Diet - low sodium heart healthy   Complete by:  As directed    Increase activity slowly    Complete by:  As directed      Allergies as of 06/01/2017      Reactions   Bupropion Other (See Comments)   suicidal ideations   Amoxicillin Rash   Has patient had a PCN reaction causing immediate rash, facial/tongue/throat swelling, SOB or lightheadedness with hypotension: Yes Has patient had a PCN reaction causing severe rash involving mucus membranes or skin necrosis: No Has patient had a PCN reaction that required hospitalization: No Has patient had a PCN reaction occurring within the last 10 years: No If all of the above answers are "NO", then may proceed with Cephalosporin use.      Medication List    STOP taking these medications   losartan 50 MG tablet Commonly known as:  COZAAR     TAKE these medications   ALPRAZolam 1 MG tablet Commonly known as:  XANAX Take 1 tablet (1 mg total) by mouth daily as needed for anxiety.   atorvastatin 10 MG tablet Commonly known as:  LIPITOR Take 1 tablet (10 mg total) by mouth daily.   cefpodoxime 200 MG tablet Commonly known as:  VANTIN Take 1 tablet (200 mg total) by mouth 2 (two) times daily for 3 days.   CINNAMON PO Take 1 tablet by mouth daily.   Fish Oil 1000 MG Caps Take 1,000 mg by mouth daily.   fluticasone 50 MCG/ACT nasal spray Commonly known as:  FLONASE Place 1 spray into both nostrils daily. What changed:    when to take this  reasons to take this   hyoscyamine 0.125 MG SL tablet Commonly known as:  LEVSIN SL Place 1 tablet (0.125 mg total) under the tongue every 6 (six) hours as needed. What changed:  reasons to take this   insulin NPH-regular Human (70-30) 100 UNIT/ML injection Commonly known as:  NOVOLIN 70/30 Inject 25 Units into the skin 2 (two) times daily with a meal.   levothyroxine 112 MCG tablet Commonly known as:  SYNTHROID, LEVOTHROID Take 112 mcg by mouth daily before breakfast.   polyethylene glycol packet Commonly known as:  MIRALAX / GLYCOLAX Take 17 g by mouth daily.     propranolol 10 MG tablet Commonly known as:  INDERAL Take 1 tablet (10 mg total) by mouth daily.   TURMERIC PO Take 1 capsule by mouth daily.      Allergies  Allergen Reactions  . Bupropion Other (  See Comments)    suicidal ideations  . Amoxicillin Rash    Has patient had a PCN reaction causing immediate rash, facial/tongue/throat swelling, SOB or lightheadedness with hypotension: Yes Has patient had a PCN reaction causing severe rash involving mucus membranes or skin necrosis: No Has patient had a PCN reaction that required hospitalization: No Has patient had a PCN reaction occurring within the last 10 years: No If all of the above answers are "NO", then may proceed with Cephalosporin use.    Follow-up Information    Danford, Valetta Fuller D, NP Follow up in 1 week(s).   Specialty:  Family Medicine Why:  repeat cbc/bmp at hospital discharge follow up. pcp to continue titrate blood pressure meds. Contact information: [place] [address] [phone]        Curt Bears, MD Follow up in 1 week(s).   Specialty:  Oncology Contact information: [place] [address] [phone]            The results of significant diagnostics from this hospitalization (including imaging, microbiology, ancillary and laboratory) are listed below for reference.    Significant Diagnostic Studies: Dg Chest 2 View  Result Date: 05/31/2017 CLINICAL DATA:  Fever EXAM: CHEST - 2 VIEW COMPARISON:  None. FINDINGS: Heart and mediastinal contours are within normal limits. No focal opacities or effusions. No acute bony abnormality. IMPRESSION: No active cardiopulmonary disease. Electronically Signed   By: Rolm Baptise M.D.   On: 05/31/2017 09:10   Ct Chest W Contrast  Result Date: 05/31/2017 CLINICAL DATA:  Cancer diagnosis yesterday. 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 Current exam  is for staging. EXAM: CT CHEST WITH CONTRAST TECHNIQUE: Multidetector CT imaging of the chest was performed during intravenous contrast administration. CONTRAST:  94mL ISOVUE-300 IOPAMIDOL (ISOVUE-300) INJECTION 61% COMPARISON:  Abdomen and pelvis CT, 05/30/2017. FINDINGS: Cardiovascular: Heart is normal size and configuration. No pericardial effusion. Minor left coronary artery calcifications. Great vessels normal in caliber. Minor descending thoracic aortic atherosclerosis. Mediastinum/Nodes: No enlarged mediastinal, hilar, or axillary lymph nodes. Thyroid gland, trachea, and esophagus demonstrate no significant findings. Lungs/Pleura: Minor areas of subsegmental atelectasis. Lungs otherwise clear. Specifically, no masses or nodules. No pleural effusion.  No pneumothorax. Upper Abdomen: Multiple liver metastatic lesions as described on the previous day's abdomen and pelvis CT. Musculoskeletal: No fracture or acute finding. No osteoblastic or osteolytic lesions. IMPRESSION: 1. No evidence of metastatic disease in the chest. 2. No acute findings. Aortic Atherosclerosis (ICD10-I70.0). Electronically Signed   By: Lajean Manes M.D.   On: 05/31/2017 18:14   Ct Abdomen Pelvis W Contrast  Result Date: 05/30/2017 CLINICAL DATA:  Left lower quadrant pain. Symptoms for 3 days. Elevated white blood cell count. Diabetes. Hypertension. Diverticulitis suspected. EXAM: CT ABDOMEN AND PELVIS WITH CONTRAST TECHNIQUE: Multidetector CT imaging of the abdomen and pelvis was performed using the standard protocol following bolus administration of intravenous contrast. CONTRAST:  121mL ISOVUE-300 IOPAMIDOL (ISOVUE-300) INJECTION 61% COMPARISON:  Ultrasound of 06/09/2016. FINDINGS: Lower chest: Clear lung bases. Normal heart size without pericardial or pleural effusion. Hepatobiliary: Hepatomegaly at 22.6 cm craniocaudal. Large volume bilateral hepatic masses. Index lateral segment left liver lobe lesion measures 5.4 x 5.6 cm on image  12/2. Dominant segment 4 lesion measures 8.1 x 6.5 cm. Normal gallbladder, without biliary ductal dilatation. Pancreas: Normal, without mass or ductal dilatation. Spleen: Normal in size, without focal abnormality. Adrenals/Urinary Tract: Normal adrenal glands. Normal kidneys, without hydronephrosis. Normal  urinary bladder. Stomach/Bowel: Normal stomach, without wall thickening. Irregular moderate rectosigmoid wall thickening, including on image 70/2. Sagittal image 57/6. Large stool burden more proximally, suggesting a component of obstruction. Normal terminal ileum and appendix. Normal small bowel. Vascular/Lymphatic: Mild but age advanced aortic atherosclerosis. Portacaval node of 2.0 cm on image 26/2. Adenopathy within the sigmoid mesocolon, including at 8 mm on image 63/2 and 11 mm on image 64/2. Reproductive: The uterus is positioned eccentric left. No adnexal mass. Other: No significant free fluid. No evidence of omental or peritoneal disease. Musculoskeletal: Transitional S1 vertebral body. IMPRESSION: 1. 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 Abdominopelvic adenopathy. Rectosigmoid mass with suggestion of partial obstruction as evidenced by large colonic stool burden more proximally. 2.  Aortic Atherosclerosis (ICD10-I70.0).  This is age advanced. Electronically Signed   By: Abigail Miyamoto M.D.   On: 05/30/2017 17:19    Microbiology: Recent Results (from the past 240 hour(s))  Culture, blood (x 2)     Status: None (Preliminary result)   Collection Time: 05/30/17  4:13 PM  Result Value Ref Range Status   Specimen Description   Final    BLOOD LEFT ARM Performed at [facility], [address], [place], [place] [address]    Special Requests   Final    BOTTLES DRAWN AEROBIC AND ANAEROBIC Blood Culture adequate volume Performed at [facility], [address] [place], [place] [address]    Culture PENDING  Incomplete    Report Status PENDING  Incomplete     Labs: Basic Metabolic Panel: Recent Labs  Lab 05/30/17 1525 05/31/17 0301 06/01/17 0527  NA 138 143 138  K 3.4* 3.4* 3.6  CL 102 109 105  CO2 25 24 24   GLUCOSE 197* 173* 200*  BUN 10 5* 6  CREATININE 0.72 0.60 0.65  CALCIUM 7.8* 7.0* 7.6*  MG 2.2 1.9 2.0  PHOS 3.8 3.4  --    Liver Function Tests: Recent Labs  Lab 05/30/17 1525 05/31/17 0301 06/01/17 0527  AST 30 23 20   ALT 16 13* 12*  ALKPHOS 174* 141* 133*  BILITOT 0.2* 0.4 0.0*  PROT 8.4* 6.8 6.7  ALBUMIN 4.0 3.2* 3.0*   Recent Labs  Lab 05/30/17 1525  LIPASE 23   No results for input(s): AMMONIA in the last 168 hours. CBC: Recent Labs  Lab 05/30/17 1525 05/31/17 0301 06/01/17 0527  WBC 14.1* 9.1 8.4  NEUTROABS  --   --  6.3  HGB 10.2* 8.6* 8.6*  HCT 33.9* 29.7* 28.5*  MCV 73.1* 74.4* 71.8*  PLT 396 295 265   Cardiac Enzymes: No results for input(s): CKTOTAL, CKMB, CKMBINDEX, TROPONINI in the last 168 hours. BNP: BNP (last 3 results) No results for input(s): BNP in the last 8760 hours.  ProBNP (last 3 results) No results for input(s): PROBNP in the last 8760 hours.  CBG: Recent Labs  Lab 05/31/17 0740 05/31/17 1141 05/31/17 1626 05/31/17 2030 06/01/17 0742  GLUCAP 130* 149* 194* 273* 223*       Signed:  Florencia Reasons MD, PhD  [facility] 06/01/2017, 10:22 AM

## 2017-06-02 ENCOUNTER — Telehealth: Payer: Self-pay | Admitting: Hematology

## 2017-06-02 LAB — AFP TUMOR MARKER: AFP, Serum, Tumor Marker: 4.5 ng/mL (ref 0.0–8.3)

## 2017-06-02 LAB — CEA
CEA: 353.3 ng/mL — ABNORMAL HIGH (ref 0.0–4.7)
CEA: 350.7 ng/mL — ABNORMAL HIGH (ref 0.0–4.7)

## 2017-06-02 NOTE — Assessment & Plan Note (Addendum)
She has been off Losartan for months due to rx recall She was started on Propranolol 10mg  QD in hosp- refill sent in BP today 137/84, HR 93 Will hold off starting ACE since BP at goal

## 2017-06-02 NOTE — Telephone Encounter (Signed)
Pt has been scheduled to see Dr. Burr Medico on 3/15 at 230pm. Pt aware to arrive 30 minutes early. Location given.

## 2017-06-02 NOTE — Progress Notes (Addendum)
Subjective:    Patient ID: Beverly Schwartz, female    DOB: Mar 12, 1971, 47 y.o.   MRN: [id number]  HPI:  [name] presents for hospital f/u, admission 05/30/17-06/01/17 -She presented with3-day history of abdominal pain and diarrhea with LLQ abdominal pain and occasional hematochezia.  Renal endorses nausea but had not vomited.  Prior history of abnormal liver ultrasound1 year Beverly Schwartz was supposed to follow-up with CT of abdomen/pelvis per recommendation of GI, however she did not have study completed. -05/31/17- recto-sigmoid colon mass - biospied; dx'd with colon ca with mets to liver -She went off Losartan months ago, due to recall -She was started on prapranolol 10 mg for due to HTN, tachycardia and anxiety history. -She completed course of Vantin 200mg  BID for sepsis -Hgb 10 on admission, hgb stable at  8.6 during hospitalization- CBC repeated today She has appt with oncology 06/05/17 She reports diarrhea with scant amount of blood in stool last night. She denies current abdominal pain or N/V She reports appetite is slowly returning. She reports AM BS 90-110s, she has resumed home insulin regime GI has called her, advised her to call back to discuss diet, nausea/diarrhea/hematochezia CBC/CMP repeated today. Reviewed all hospitalization notes/imaging/labs- last blood culture results were neg Mother at [facility] Mother and pt, both tearful at times    Patient Care Team    Relationship Specialty Notifications Start End  Mina Marble D, NP PCP - General Family Medicine  07/07/16   Delrae Rend, MD Consulting Physician Endocrinology  07/07/16     Patient Active Problem List   Diagnosis Date Noted  . SIRS (systemic inflammatory response syndrome) ([place]) 05/30/2017  . Healthcare maintenance 12/10/2016  . Elevated LFTs 12/10/2016  . Generalized abdominal pain 09/30/2016  . Anxiety 09/30/2016  . Sinusitis 09/03/2016  . Diabetes mellitus without complication ([place]) 38/18/2993  .  Hypothyroid 07/07/2016  . Hypertension 07/07/2016  . Hyperlipidemia 08/06/2010  . Tachycardia   . Chest pain   . Dizziness   . Obesity      Past Medical History:  Diagnosis Date  . Anxiety   . Cancer ([place])    colon, liver  . Chest pain   . Complication of anesthesia   . Depression   . Diabetes mellitus   . Dizziness   . Hyperlipidemia   . Hypertension   . Hypothyroidism   . Obesity   . PONV (postoperative nausea and vomiting)   . Tachycardia      Past Surgical History:  Procedure Laterality Date  . FLEXIBLE SIGMOIDOSCOPY N/A 05/31/2017   Procedure: FLEXIBLE SIGMOIDOSCOPY;  Surgeon: Clarene Essex, MD;  Location: [facility] END[facility]COPY;  Service: Endoscopy;  Laterality: N/A;  . WISDOM TOOTH EXTRACTION     age 47's     Family History  Problem Relation Age of Onset  . Heart disease Father   . Healthy Mother   . Hyperlipidemia Sister   . Healthy Brother   . Heart disease Paternal Uncle   . Heart attack Paternal Uncle   . Healthy Son   . Breast cancer Maternal Grandmother   . Colon cancer Neg Hx   . Esophageal cancer Neg Hx   . Rectal cancer Neg Hx   . Liver cancer Neg Hx      Social History   Substance and Sexual Activity  Drug Use No     Social History   Substance and Sexual Activity  Alcohol Use No     Social History   Tobacco Use  Smoking Status Never Smoker  Smokeless Tobacco Never Used     Outpatient Encounter Medications as of 06/03/2017  Medication Sig  . ALPRAZolam (XANAX) 1 MG tablet Take 1 tablet (1 mg total) by mouth daily as needed for anxiety.  Marland Kitchen atorvastatin (LIPITOR) 10 MG tablet Take 1 tablet (10 mg total) by mouth daily.  . cefpodoxime (VANTIN) 200 MG tablet Take 1 tablet (200 mg total) by mouth 2 (two) times daily for 3 days.  Marland Kitchen CINNAMON PO Take 1 tablet by mouth daily.   . fluticasone (FLONASE) 50 MCG/ACT nasal spray Place 1 spray into both nostrils daily. (Patient taking differently: Place 1 spray into both nostrils daily as needed  for allergies. )  . hyoscyamine (LEVSIN SL) 0.125 MG SL tablet Place 1 tablet (0.125 mg total) under the tongue every 6 (six) hours as needed. (Patient taking differently: Place 0.125 mg under the tongue every 6 (six) hours as needed for cramping. )  . insulin NPH-regular Human (NOVOLIN 70/30) (70-30) 100 UNIT/ML injection Inject 25 Units into the skin 2 (two) times daily with a meal.   . levothyroxine (SYNTHROID, LEVOTHROID) 112 MCG tablet Take 112 mcg by mouth daily before breakfast.  . Omega-3 Fatty Acids (FISH OIL) 1000 MG CAPS Take 1,000 mg by mouth daily.   . polyethylene glycol (MIRALAX / GLYCOLAX) packet Take 17 g by mouth daily.  . propranolol (INDERAL) 10 MG tablet Take 1 tablet (10 mg total) by mouth daily.  . TURMERIC PO Take 1 capsule by mouth daily.   . [DISCONTINUED] ALPRAZolam (XANAX) 1 MG tablet Take 1 tablet (1 mg total) by mouth daily as needed for anxiety.  . [DISCONTINUED] propranolol (INDERAL) 10 MG tablet Take 1 tablet (10 mg total) by mouth daily.   No facility-administered encounter medications on file as of 06/03/2017.     Allergies: Bupropion and Amoxicillin  Body mass index is 26.73 kg/m.  Blood pressure 137/84, pulse 93, height 5\' 5"  (1.651 m), weight 160 lb 9.6 oz (72.8 kg), last menstrual period 05/22/2017, SpO2 100 %.        Review of Systems  Constitutional: Positive for activity change, appetite change, fatigue and unexpected weight change. Negative for chills, diaphoresis and fever.  Eyes: Negative for visual disturbance.  Respiratory: Negative for cough, chest tightness, shortness of breath, wheezing and stridor.   Cardiovascular: Negative for chest pain, palpitations and leg swelling.  Gastrointestinal: Positive for abdominal pain, blood in stool and diarrhea. Negative for abdominal distention, constipation, nausea and vomiting.  Genitourinary: Negative for difficulty urinating, flank pain and hematuria.  Neurological: Negative for dizziness and  headaches.  Hematological: Does not bruise/bleed easily.  Psychiatric/Behavioral: Positive for sleep disturbance. Negative for decreased concentration, dysphoric mood, hallucinations, self-injury and suicidal ideas. The patient is nervous/anxious. The patient is not hyperactive.        Objective:   Physical Exam  Constitutional: She appears well-developed and well-nourished. She appears distressed.  Tearful at times  Cardiovascular: Normal rate, regular rhythm, normal heart sounds and intact distal pulses.  No murmur heard. Pulmonary/Chest: Effort normal and breath sounds normal. No respiratory distress. She has no wheezes. She has no rales. She exhibits no tenderness.  Abdominal: Soft. Bowel sounds are normal. She exhibits no distension and no mass. There is no tenderness. There is no rigidity, no rebound and no guarding.  + BS in all quadrants  Skin: Skin is warm and dry. No rash noted. She is not diaphoretic. No erythema. No pallor.  Psychiatric: She has a normal mood and affect.  Her behavior is normal. Judgment and thought content normal.  Nursing note and vitals reviewed.         Assessment & Plan:   1. Malignant neoplasm of transverse colon ([place])   2. Essential hypertension   3. Anxiety     Hypertension She has been off Losartan for months due to rx recall She was started on Propranolol 10mg  QD in hosp- refill sent in BP today 137/84, HR 93 Will hold off starting ACE since BP at goal  [place] reviewed. Refill Alprazolam sent in [facility] meditation and guided imagery   Malignant neoplasm of transverse colon [facility]) F/u with GI as directed F/u with Oncology as directed- has appt 06/05/17   Pt was in the office today for 25+ minutes, with over 50% time spent in face to face counseling of patient's various medical conditions and in coordination of care FOLLOW-UP:  Return in about 6 months (around 12/04/2017).

## 2017-06-03 ENCOUNTER — Encounter: Payer: Self-pay | Admitting: Adult Health

## 2017-06-03 ENCOUNTER — Ambulatory Visit: Payer: BLUE CROSS/BLUE SHIELD | Admitting: Adult Health

## 2017-06-03 VITALS — BP 137/84 | HR 93 | Ht 65.0 in | Wt 160.6 lb

## 2017-06-03 DIAGNOSIS — I10 Essential (primary) hypertension: Secondary | ICD-10-CM

## 2017-06-03 DIAGNOSIS — C184 Malignant neoplasm of transverse colon: Secondary | ICD-10-CM

## 2017-06-03 DIAGNOSIS — C19 Malignant neoplasm of rectosigmoid junction: Secondary | ICD-10-CM | POA: Insufficient documentation

## 2017-06-03 DIAGNOSIS — F419 Anxiety disorder, unspecified: Secondary | ICD-10-CM

## 2017-06-03 MED ORDER — ALPRAZOLAM 1 MG PO TABS: 1.0000 mg | ORAL_TABLET | Freq: Every day | ORAL | 0 refills | Status: DC | PRN

## 2017-06-03 MED ORDER — PROPRANOLOL HCL 10 MG PO TABS: 10.0000 mg | ORAL_TABLET | Freq: Every day | ORAL | 0 refills | Status: DC

## 2017-06-03 NOTE — Patient Instructions (Signed)
Diarrhea, Adult Diarrhea is when you have loose and water poop (stool) often. Diarrhea can make you feel weak and cause you to get dehydrated. Dehydration can make you tired and thirsty, make you have a dry mouth, and make it so you pee (urinate) less often. Diarrhea often lasts 2-3 days. However, it can last longer if it is a sign of something more serious. It is important to treat your diarrhea as told by your doctor. Follow these instructions at home: Eating and drinking  Follow these recommendations as told by your doctor:  Take an oral rehydration solution (ORS). This is a drink that is sold at pharmacies and stores.  Drink clear fluids, such as: ? Water. ? Ice chips. ? Diluted fruit juice. ? Low-calorie sports drinks.  Eat bland, easy-to-digest foods in small amounts as you are able. These foods include: ? Bananas. ? Applesauce. ? Rice. ? Low-fat (lean) meats. ? Toast. ? Crackers.  Avoid drinking fluids that have a lot of sugar or caffeine in them.  Avoid alcohol.  Avoid spicy or fatty foods.  General instructions   Drink enough fluid to keep your pee (urine) clear or pale yellow.  Wash your hands often. If you cannot use soap and water, use hand sanitizer.  Make sure that all people in your home wash their hands well and often.  Take over-the-counter and prescription medicines only as told by your doctor.  Rest at home while you get better.  Watch your condition for any changes.  Take a warm bath to help with any burning or pain from having diarrhea.  Keep all follow-up visits as told by your doctor. This is important. Contact a doctor if:  You have a fever.  Your diarrhea gets worse.  You have new symptoms.  You cannot keep fluids down.  You feel light-headed or dizzy.  You have a headache.  You have muscle cramps. Get help right away if:  You have chest pain.  You feel very weak or you pass out (faint).  You have bloody or black poop or  poop that look like tar.  You have very bad pain, cramping, or bloating in your belly (abdomen).  You have trouble breathing or you are breathing very quickly.  Your heart is beating very quickly.  Your skin feels cold and clammy.  You feel confused.  You have signs of dehydration, such as: ? Dark pee, hardly any pee, or no pee. ? Cracked lips. ? Dry mouth. ? Sunken eyes. ? Sleepiness. ? Weakness. This information is not intended to replace advice given to you by your health care provider. Make sure you discuss any questions you have with your health care provider. Document Released: 08/27/2007 Document Revised: 09/28/2015 Document Reviewed: 11/14/2014 Elsevier Interactive Patient Education  [employer].  Please continue all medications as directed. Continue Propanolol 10mg  once daily- Rx refilled. Please ensure that you complete antibiotics from hospital. Please call GI specialist, re: diet and symptom control. Follow-up in 6 months, sooner if needed. Thoughts and positivity are with you!

## 2017-06-03 NOTE — Assessment & Plan Note (Signed)
F/u with GI as directed F/u with Oncology as directed- has appt 06/05/17

## 2017-06-03 NOTE — Assessment & Plan Note (Signed)
[place] Controlled Substance Database reviewed. Refill Alprazolam sent in [facility] meditation and guided imagery

## 2017-06-04 LAB — CBC WITH DIFFERENTIAL/PLATELET
Basophils Absolute: 0.1 10*3/uL (ref 0.0–0.2)
Basos: 1 %
EOS (ABSOLUTE): 0.9 10*3/uL — ABNORMAL HIGH (ref 0.0–0.4)
Eos: 9 %
Hematocrit: 28.8 % — ABNORMAL LOW (ref 34.0–46.6)
Hemoglobin: 8.7 g/dL — ABNORMAL LOW (ref 11.1–15.9)
Immature Grans (Abs): 0 10*3/uL (ref 0.0–0.1)
Immature Granulocytes: 0 %
Lymphocytes Absolute: 2.2 10*3/uL (ref 0.7–3.1)
Lymphs: 21 %
MCH: 21.5 pg — ABNORMAL LOW (ref 26.6–33.0)
MCHC: 30.2 g/dL — ABNORMAL LOW (ref 31.5–35.7)
MCV: 71 fL — ABNORMAL LOW (ref 79–97)
Monocytes Absolute: 0.6 10*3/uL (ref 0.1–0.9)
Monocytes: 6 %
Neutrophils Absolute: 6.6 10*3/uL (ref 1.4–7.0)
Neutrophils: 63 %
Platelets: 323 10*3/uL (ref 150–379)
RBC: 4.05 x10E6/uL (ref 3.77–5.28)
RDW: 16.4 % — ABNORMAL HIGH (ref 12.3–15.4)
WBC: 10.4 10*3/uL (ref 3.4–10.8)

## 2017-06-04 LAB — COMPREHENSIVE METABOLIC PANEL
ALT: 12 IU/L (ref 0–32)
AST: 26 IU/L (ref 0–40)
Albumin/Globulin Ratio: 1.3 (ref 1.2–2.2)
Albumin: 3.9 g/dL (ref 3.5–5.5)
Alkaline Phosphatase: 163 IU/L — ABNORMAL HIGH (ref 39–117)
BUN/Creatinine Ratio: 14 (ref 9–23)
BUN: 10 mg/dL (ref 6–24)
Bilirubin Total: 0.2 mg/dL (ref 0.0–1.2)
CO2: 24 mmol/L (ref 20–29)
Calcium: 8 mg/dL — ABNORMAL LOW (ref 8.7–10.2)
Chloride: 105 mmol/L (ref 96–106)
Creatinine, Ser: 0.71 mg/dL (ref 0.57–1.00)
GFR calc Af Amer: 120 mL/min/{1.73_m2} (ref >59)
GFR calc non Af Amer: 104 mL/min/{1.73_m2} (ref >59)
Globulin, Total: 2.9 g/dL (ref 1.5–4.5)
Glucose: 113 mg/dL — ABNORMAL HIGH (ref 65–99)
Potassium: 4.3 mmol/L (ref 3.5–5.2)
Sodium: 145 mmol/L — ABNORMAL HIGH (ref 134–144)
Total Protein: 6.8 g/dL (ref 6.0–8.5)

## 2017-06-05 ENCOUNTER — Encounter: Payer: Self-pay | Admitting: Nurse Practitioner

## 2017-06-05 ENCOUNTER — Inpatient Hospital Stay: Payer: BLUE CROSS/BLUE SHIELD | Attending: Hematology | Admitting: Nurse Practitioner

## 2017-06-05 VITALS — BP 167/96 | HR 88 | Temp 98.3°F | Resp 19 | Ht 65.0 in | Wt 165.4 lb

## 2017-06-05 DIAGNOSIS — K7689 Other specified diseases of liver: Secondary | ICD-10-CM

## 2017-06-05 DIAGNOSIS — E119 Type 2 diabetes mellitus without complications: Secondary | ICD-10-CM | POA: Diagnosis not present

## 2017-06-05 DIAGNOSIS — E039 Hypothyroidism, unspecified: Secondary | ICD-10-CM | POA: Diagnosis not present

## 2017-06-05 DIAGNOSIS — C187 Malignant neoplasm of sigmoid colon: Secondary | ICD-10-CM | POA: Diagnosis not present

## 2017-06-05 DIAGNOSIS — I700 Atherosclerosis of aorta: Secondary | ICD-10-CM | POA: Diagnosis not present

## 2017-06-05 DIAGNOSIS — D500 Iron deficiency anemia secondary to blood loss (chronic): Secondary | ICD-10-CM

## 2017-06-05 DIAGNOSIS — C787 Secondary malignant neoplasm of liver and intrahepatic bile duct: Secondary | ICD-10-CM | POA: Diagnosis not present

## 2017-06-05 DIAGNOSIS — D509 Iron deficiency anemia, unspecified: Secondary | ICD-10-CM

## 2017-06-05 LAB — CULTURE, BLOOD (ROUTINE X 2)
Culture: NO GROWTH
Culture: NO GROWTH
Culture: NO GROWTH
Culture: NO GROWTH
Special Requests: ADEQUATE
Special Requests: ADEQUATE
Special Requests: ADEQUATE

## 2017-06-05 NOTE — Progress Notes (Addendum)
[place]  [phone]) [phone] Fax:([phone]) [place] Note   Patient Care Team: Esaw Grandchild, NP as PCP - General (Family Medicine) Delrae Rend, MD as Consulting Physician (Endocrinology) Truitt Merle, MD as Consulting Physician (Hematology) Alla Feeling, NP as Nurse Practitioner (Nurse Practitioner) Clarene Essex, MD as Consulting Physician (Gastroenterology) 06/05/2017  CHIEF COMPLAINTS/PURPOSE OF CONSULTATION:  Rectosigmoid adenocarcinoma   Referred by Dr. Watt Climes, Gastroenterology   Oncology History   Cancer Staging No matching staging information was found for the patient.       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 Abdominopelvic adenopathy. Rectosigmoid mass with suggestion of partial obstruction as evidenced by large colonic stool burden more proximally. 2.  Aortic Atherosclerosis (ICD10-I70.0).  This is age advanced.      05/31/2017 Tumor Marker    CEA 353.3 (elevated) AFP 4.5 (normal)      05/31/2017 Initial Biopsy    Diagnosis Colon, biopsy, sigmoid - INVASIVE ADENOCARCINOMA - SEE COMMENT      05/31/2017 Imaging    CT CHEST IMPRESSION: 1. No evidence of metastatic disease in the chest. 2. No acute findings.  Aortic Atherosclerosis (ICD10-I70.0).       05/31/2017 Procedure    Colonoscopy per Dr. Watt Climes Findings: An infiltrative and ulcerated partially obstructing medium-sized mass was found in the recto-sigmoid colon. The mass was circumferential. The mass measured four cm in length. No bleeding was present.   Impression - One small polyp in the rectum. - The examination was otherwise normal. - Malignant partially obstructing tumor in the recto-sigmoid colon. Biopsied. - One medium polyp in the proximal sigmoid colon. - The examination was otherwise  normal. - Internal hemorrhoids.      06/03/2017 Initial Diagnosis    Malignant neoplasm of transverse colon (HCC)      HISTORY OF PRESENTING ILLNESS:  Beverly Schwartz 47 y.o. female is here because of newly diagnosed sigmoid colon cancer.  She presents with her mother and God-mother. She first experienced left sided abdominal pain in late spring 2018 that eventually resolved on its own.  Symptoms returned early March 2019; she went to ER 05/30/17 with 3-day history of left lower quadrant pain with associated diarrhea and intermittent blood in stool which she attributed to internal hemorrhoids and "IBS."  Associated fatigue and decreased appetite with 35 pounds weight loss over 1 year, some of which was intentional.  Found to be mildly anemic on workup, Hgb 10.2, with low serum iron.  Did not receive iron or blood transfusion.  Underwent CT AP which showed rectosigmoid mass suggesting partial obstruction as evident by large proximal stool burden, abdominopelvic adenopathy, and large volume bilateral hepatic masses, concerning for metastatic disease.  Staging workup was negative for metastatic disease in the chest.  Notably there was finding of mild but age advanced atherosclerosis. No prior colonoscopy.  She then underwent flex sigmoidoscopy per Dr. Watt Climes, with findings of infiltrative and ulcerated partially obstructing medium-sized mass in the rectosigmoid colon.  Biopsy was positive for invasive adenocarcinoma.  Baseline CEA markedly elevated, 353.3.  She was discharged home with outpatient GI and oncology follow-up.  Past medical history significant for abnormal LFTs in 05/2016, abdominal US revealed liver cysts and fatty liver. DM since 2001, on injectable insulin BID AC.  Hypothyroidism diagnosed in 2001, controlled with Synthroid and followed by endocrine.  Hypertension, on Inderal.  Hyperlipidemia  with known atherosclerosis on imaging, previously prescribed statin but does not take it; takes fish oil  currently. Anxiety, stable on xanax. Recently underwent wart removal of left foot, on Cefpodoxime per podiatry; presents in orthopedic shoe. She is divorced, lives with her mother currently; has 67 year old son who shares time with his father.  She is a Producer, television/film/video.  Independent of all ADLs, does not exercise regularly.  Denies alcohol or tobacco use.  No known family history of colon cancer; Breast cancer in [place].   Today she feels well, continues to have fatigue at baseline. Appetite is improving.  Had one episode of nausea and vomiting recently that is resolved.  Passing stool with regular MiraLAX use.  Intermittent hematochezia.  Denies abdominal pain since hospital discharge, still has occasional cramps but has been eating smaller meals and this seems to help.  MEDICAL HISTORY:  Past Medical History:  Diagnosis Date  . Anxiety   . Cancer ([place])    colon, liver  . Chest pain   . Colon cancer ([place])   . Complication of anesthesia   . Depression   . Diabetes mellitus   . Dizziness   . Hyperlipidemia   . Hypertension   . Hypothyroidism   . Obesity   . PONV (postoperative nausea and vomiting)   . Tachycardia     SURGICAL HISTORY: Past Surgical History:  Procedure Laterality Date  . FLEXIBLE SIGMOIDOSCOPY N/A 05/31/2017   Procedure: FLEXIBLE SIGMOIDOSCOPY;  Surgeon: Clarene Essex, MD;  Location: [facility] END[facility]COPY;  Service: Endoscopy;  Laterality: N/A;  . WISDOM TOOTH EXTRACTION     age 16's    SOCIAL HISTORY: Social History   Socioeconomic History  . Marital status: Divorced    Spouse name: Not on file  . Number of children: 1  . Years of education: Not on file  . Highest education level: Not on file  Social Needs  . Financial resource strain: Not on file  . Food insecurity - worry: Not on file  . Food insecurity - inability: Not on file  . Transportation needs - medical: Not on file  . Transportation needs - non-medical: Not on file  Occupational History  . Occupation:  Producer, television/film/video  Tobacco Use  . Smoking status: Never Smoker  . Smokeless tobacco: Never Used  Substance and Sexual Activity  . Alcohol use: No  . Drug use: No  . Sexual activity: No  Other Topics Concern  . Not on file  Social History Narrative  . Not on file    FAMILY HISTORY: Family History  Problem Relation Age of Onset  . Heart disease Father   . Healthy Mother   . Hyperlipidemia Sister   . Healthy Brother   . Heart disease Paternal Uncle   . Heart attack Paternal Uncle   . Healthy Son   . Breast cancer Maternal Grandmother   . Colon cancer Neg Hx   . Esophageal cancer Neg Hx   . Rectal cancer Neg Hx   . Liver cancer Neg Hx     ALLERGIES:  is allergic to bupropion and amoxicillin.  MEDICATIONS:  Current Outpatient Medications  Medication Sig Dispense Refill  . ALPRAZolam (XANAX) 1 MG tablet Take 1 tablet (1 mg total) by mouth daily as needed for anxiety. 20 tablet 0  . CINNAMON PO Take 1 tablet by mouth daily.     . fluticasone (FLONASE) 50 MCG/ACT nasal spray Place 1 spray into both nostrils daily. (Patient taking differently: Place 1 spray  into both nostrils daily as needed for allergies. ) 16 g 2  . insulin NPH-regular Human (NOVOLIN 70/30) (70-30) 100 UNIT/ML injection Inject 25 Units into the skin 2 (two) times daily with a meal.     . levothyroxine (SYNTHROID, LEVOTHROID) 112 MCG tablet Take 112 mcg by mouth daily before breakfast.    . Omega-3 Fatty Acids (FISH OIL) 1000 MG CAPS Take 1,000 mg by mouth daily.     . polyethylene glycol (MIRALAX / GLYCOLAX) packet Take 17 g by mouth daily. 14 each 0  . propranolol (INDERAL) 10 MG tablet Take 1 tablet (10 mg total) by mouth daily. 90 tablet 0  . TURMERIC PO Take 1 capsule by mouth daily.     Marland Kitchen atorvastatin (LIPITOR) 10 MG tablet Take 1 tablet (10 mg total) by mouth daily. 90 tablet 3  . hyoscyamine (LEVSIN SL) 0.125 MG SL tablet Place 1 tablet (0.125 mg total) under the tongue every 6 (six) hours as needed.  (Patient taking differently: Place 0.125 mg under the tongue every 6 (six) hours as needed for cramping. ) 40 tablet 2   No current facility-administered medications for this visit.     REVIEW OF SYSTEMS:   Constitutional: Denies fevers, chills or abnormal night sweats (+) fatigue at baseline (+) low appetite, improving Eyes: Denies blurriness of vision, double vision or watery eyes Ears, nose, mouth, throat, and face: Denies mucositis or sore throat Respiratory: Denies dyspnea or wheezes (+) intermittent dry cough Cardiovascular: Denies palpitation, chest discomfort or lower extremity swelling Gastrointestinal:  Denies heartburn (+) nausea with vomiting x1 recently (+) intermittent abdominal cramps (+) LLQ pain on presentation, resolved (+) intermittent hematochezia  Skin: Denies abnormal skin rashes Lymphatics: Denies new lymphadenopathy or easy bruising Neurological:Denies numbness, tingling or new weaknesses Behavioral/Psych: Mood is stable, no new changes  Endocrine: (+) Hypothyroidism All other systems were reviewed with the patient and are negative.  PHYSICAL EXAMINATION: ECOG PERFORMANCE STATUS: 1 - Symptomatic but completely ambulatory  Vitals:   06/05/17 1433  BP: (!) 167/96  Pulse: 88  Resp: 19  Temp: 98.3 F (36.8 C)  SpO2: 100%   Filed Weights   06/05/17 1433  Weight: 165 lb 6.4 oz (75 kg)    GENERAL:alert, no distress and comfortable SKIN: skin color, texture, turgor are normal, no rashes or significant lesions (+) facial and chest acne EYES: normal, conjunctiva are pink and non-injected, sclera clear OROPHARYNX:no exudate, no erythema and lips, buccal mucosa, and tongue normal  NECK: supple, thyroid normal size, non-tender, without nodularity LYMPH:  no palpable cervical, supraclavicular, axillary, or inguinal lymphadenopathy  LUNGS: clear to auscultation bilaterally with normal breathing effort HEART: regular rate & rhythm and no murmurs (+) trace lower  extremity edema ABDOMEN:abdomen soft, non-tender and normal bowel sounds (+) hepatomegaly  Musculoskeletal:no cyanosis of digits and no clubbing (+) left foot s/p wart removal, covered in orthopedic shoe PSYCH: alert & oriented x 3 with fluent speech NEURO: no focal motor/sensory deficits  LABORATORY DATA:  I have reviewed the data as listed CBC Latest Ref Rng & Units 06/03/2017 06/01/2017 05/31/2017  WBC 3.4 - 10.8 x10E3/uL 10.4 8.4 9.1  Hemoglobin 11.1 - 15.9 g/dL 8.7(L) 8.6(L) 8.6(L)  Hematocrit 34.0 - 46.6 % 28.8(L) 28.5(L) 29.7(L)  Platelets 150 - 379 x10E3/uL 323 265 295   CMP Latest Ref Rng & Units 06/03/2017 06/01/2017 05/31/2017  Glucose 65 - 99 mg/dL 113(H) 200(H) 173(H)  BUN 6 - 24 mg/dL 10 6 5(L)  Creatinine 0.57 - 1.00 mg/dL  0.71 0.65 0.60  Sodium 134 - 144 mmol/L 145(H) 138 143  Potassium 3.5 - 5.2 mmol/L 4.3 3.6 3.4(L)  Chloride 96 - 106 mmol/L 105 105 109  CO2 20 - 29 mmol/L '24 24 24  '$ Calcium 8.7 - 10.2 mg/dL 8.0(L) 7.6(L) 7.0(L)  Total Protein 6.0 - 8.5 g/dL 6.8 6.7 6.8  Total Bilirubin 0.0 - 1.2 mg/dL 0.2 0.0(L) 0.4  Alkaline Phos 39 - 117 IU/L 163(H) 133(H) 141(H)  AST 0 - 40 IU/L '26 20 23  '$ ALT 0 - 32 IU/L 12 12(L) 13(L)    RADIOGRAPHIC STUDIES: I have personally reviewed the radiological images as listed and agreed with the findings in the report. Dg Chest 2 View  Result Date: 05/31/2017 CLINICAL DATA:  Fever EXAM: CHEST - 2 VIEW COMPARISON:  None. FINDINGS: Heart and mediastinal contours are within normal limits. No focal opacities or effusions. No acute bony abnormality. IMPRESSION: No active cardiopulmonary disease. Electronically Signed   By: Rolm Baptise M.D.   On: 05/31/2017 09:10   Ct Chest W Contrast  Result Date: 05/31/2017 CLINICAL DATA:  Cancer diagnosis yesterday. 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 Current exam is for staging. EXAM: CT CHEST WITH CONTRAST TECHNIQUE: Multidetector  CT imaging of the chest was performed during intravenous contrast administration. CONTRAST:  31m ISOVUE-300 IOPAMIDOL (ISOVUE-300) INJECTION 61% COMPARISON:  Abdomen and pelvis CT, 05/30/2017. FINDINGS: Cardiovascular: Heart is normal size and configuration. No pericardial effusion. Minor left coronary artery calcifications. Great vessels normal in caliber. Minor descending thoracic aortic atherosclerosis. Mediastinum/Nodes: No enlarged mediastinal, hilar, or axillary lymph nodes. Thyroid gland, trachea, and esophagus demonstrate no significant findings. Lungs/Pleura: Minor areas of subsegmental atelectasis. Lungs otherwise clear. Specifically, no masses or nodules. No pleural effusion.  No pneumothorax. Upper Abdomen: Multiple liver metastatic lesions as described on the previous day's abdomen and pelvis CT. Musculoskeletal: No fracture or acute finding. No osteoblastic or osteolytic lesions. IMPRESSION: 1. No evidence of metastatic disease in the chest. 2. No acute findings. Aortic Atherosclerosis (ICD10-I70.0). Electronically Signed   By: DLajean ManesM.D.   On: 05/31/2017 18:14   Ct Abdomen Pelvis W Contrast  Result Date: 05/30/2017 CLINICAL DATA:  Left lower quadrant pain. Symptoms for 3 days. Elevated white blood cell count. Diabetes. Hypertension. Diverticulitis suspected. EXAM: CT ABDOMEN AND PELVIS WITH CONTRAST TECHNIQUE: Multidetector CT imaging of the abdomen and pelvis was performed using the standard protocol following bolus administration of intravenous contrast. CONTRAST:  1037mISOVUE-300 IOPAMIDOL (ISOVUE-300) INJECTION 61% COMPARISON:  Ultrasound of 06/09/2016. FINDINGS: Lower chest: Clear lung bases. Normal heart size without pericardial or pleural effusion. Hepatobiliary: Hepatomegaly at 22.6 cm craniocaudal. Large volume bilateral hepatic masses. Index lateral segment left liver lobe lesion measures 5.4 x 5.6 cm on image 12/2. Dominant segment 4 lesion measures 8.1 x 6.5 cm. Normal  gallbladder, without biliary ductal dilatation. Pancreas: Normal, without mass or ductal dilatation. Spleen: Normal in size, without focal abnormality. Adrenals/Urinary Tract: Normal adrenal glands. Normal kidneys, without hydronephrosis. Normal urinary bladder. Stomach/Bowel: Normal stomach, without wall thickening. Irregular moderate rectosigmoid wall thickening, including on image 70/2. Sagittal image 57/6. Large stool burden more proximally, suggesting a component of obstruction. Normal terminal ileum and appendix. Normal small bowel. Vascular/Lymphatic: Mild but age advanced aortic atherosclerosis. Portacaval node of 2.0 cm on image 26/2. Adenopathy within the sigmoid mesocolon, including at 8 mm on image 63/2 and 11 mm on image 64/2. Reproductive: The uterus is positioned eccentric left. No adnexal mass.  Other: No significant free fluid. No evidence of omental or peritoneal disease. Musculoskeletal: Transitional S1 vertebral body. IMPRESSION: 1. 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 Abdominopelvic adenopathy. Rectosigmoid mass with suggestion of partial obstruction as evidenced by large colonic stool burden more proximally. 2.  Aortic Atherosclerosis (ICD10-I70.0).  This is age advanced. Electronically Signed   By: Abigail Miyamoto M.D.   On: 05/30/2017 17:19    ASSESSMENT & PLAN: Beverly Schwartz is 47 year old female who presented with LLQ pain, hematochezia, fatigue, and weight loss.  1. Sigmoid adenocarcinoma, with abdominopelvic adenopathy and likely hepatic metastasis  -We reviewed medical records including imaging and pathology with the patient and family in detail. She has what appears to be metastatic sigmoid adenocarcinoma to abdominopelvic lymph nodes and liver. Dr. Burr Medico recommends IR liver biopsy to confirm metastasis. Will request Foundation One testing and MSI with surgical pathology to see if she is a candidate for  immunotherapy or targeted therapy.  -We discussed if metastatic disease is confirmed in the liver, she would have stage IV disease that is likely not curable, but treatable.  -She is not obstructed or having severe rectal bleeding, no bowel perforation; there is no role for radiation or surgery at this point.  -Dr. Burr Medico recommends chemotherapy as the primary treatment for metastatic disease such as FOLFOX q2 weeks. She will need PAC. She is young but has multiple comorbidities, however she is still felt to be good candidate for chemotherapy. Given her age advanced aortic atherosclerosis, will obtain baseline echo before chemo if her insurance will approve. She will attend chemo education class.  -Chemotherapy consent: Side effects including but not not limited to fatigue, nausea, vomiting, diarrhea, hair thinning, neuropathy, cold sensitivity, fluid retention, renal and kidney dysfunction, neutropenic fever, need for blood transfusion, and bleeding were discussed with patient in great detail. She agrees to proceed. -The goal of therapy is palliative, to prolong her life and preserve quality of life -Will monitor her frequently with lab including CEA and f/u while on chemotherapy, will obtain restaging scans periodically to evaluate response to therapy.  -Return in 2 weeks for f/u with biopsy results and cycle 1 FOLFOX  2. Anemia, Iron deficiency -She had mild anemia in 02/2015, Hgb 11.8; do not have records previous to that.  She has microcytic, hypochromic anemia on 05/30/2017 at presentation to ER.  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  She would benefit from iron therapy.  Reviewed potential side effects such as allergy reaction and possible anaphylaxis.  She agrees to proceed and will monitor iron studies periodically. Will arrange IV Feraheme in 1 and 2 weeks.   3. DM, HTN, HL -We discussed possible effects of chemotherapy  and steroid use on her DM, she will need to check BG closely and possibly adjust insulin if hyperglycemia is not well controlled; we reviewed the importance of tight glucose control while on chemotherapy.  4. Weight loss -presented with 35 lbs weight loss, some of which was intentional. Referral to nutrition to follow while on chemotherapy. We reviewed importance of adequate nutrition in order to tolerate chemotherapy   5.  Genetics -Due to her young age and personal history of colon cancer, she qualifies for genetics counseling to rule out inheritable cancer syndrome such as Lynch syndrome.  She agrees, referral placed today  6. Social issues -She is a single parent with 36 year old son; her child's  father is involved. Patient currently lives with her mother and has social support. She is interested in SW to assist her with discussing diagnosis with her young child. Referral placed.   PLAN: -Chemo education class 1 week -IV Feraheme 1 and 2 weeks  -IR liver biopsy and PAC placement approx 1 week  -Baseline echo 1 week  -Referrals to nutrition, genetics, social work -Lab, flush, f/u Kishaun Erekson, begin FOLFOX in 2 weeks   Orders Placed This Encounter  Procedures  . US BIOPSY (LIVER)    Please obtain enough tissue for molecular (foundation one) testing    Standing Status:   Future    Standing Expiration Date:   08/06/2018    Order Specific Question:   Lab orders requested (DO NOT place separate lab orders, these will be automatically ordered during procedure specimen collection):    Answer:   Surgical Pathology    Order Specific Question:   Reason for Exam (SYMPTOM  OR DIAGNOSIS REQUIRED)    Answer:   rectosigmoid adenocarcinoma with suspcion for liver metastasis    Order Specific Question:   Preferred imaging location?    Answer:   [facility]  . IR US Guide Vasc Access Right    Standing Status:   Future    Standing Expiration Date:   08/06/2018    Order Specific Question:   Reason  for Exam (SYMPTOM  OR DIAGNOSIS REQUIRED)    Answer:   [facility] for chemotherapy    Order Specific Question:   Is the patient pregnant?    Answer:   No    Order Specific Question:   Preferred Imaging Location?    Answer:   [facility]  . CBC with Differential ([place] Only)    Standing Status:   Standing    Number of Occurrences:   100    Standing Expiration Date:   06/06/2023  . CMP ([place] only)    Standing Status:   Standing    Number of Occurrences:   100    Standing Expiration Date:   06/06/2023  . Iron and TIBC    Standing Status:   Standing    Number of Occurrences:   50    Standing Expiration Date:   06/06/2023  . Ferritin    Standing Status:   Standing    Number of Occurrences:   50    Standing Expiration Date:   06/06/2023  . CEA (IN HOUSE-[facility])    Standing Status:   Standing    Number of Occurrences:   50    Standing Expiration Date:   06/06/2023  . Ambulatory referral to Nutrition and Diabetic E    Referral Priority:   Routine    Referral Type:   Consultation    Referral Reason:   Specialty Services Required    Number of Visits Requested:   1  . Ambulatory referral to Social Work    Referral Priority:   Urgent    Referral Type:   Consultation    Referral Reason:   Specialty Services Required    Number of Visits Requested:   1  . Ambulatory referral to Genetics    Referral Priority:   Routine    Referral Type:   Consultation    Referral Reason:   Specialty Services Required    Number of Visits Requested:   1  . ECHOCARDIOGRAM COMPLETE    Age advanced atherosclerosis on imaging, please obtain baseline echo for chemotherapy    Standing Status:  Future    Standing Expiration Date:   09/06/2018    Order Specific Question:   Where should this test be performed    Answer:   [place]    Order Specific Question:   Perflutren DEFINITY (image enhancing agent) should be administered unless hypersensitivity or allergy exist    Answer:   Administer  Perflutren    Order Specific Question:   Expected Date:    Answer:   1 week    All questions were answered. The patient knows to call the clinic with any problems, questions or concerns. I spent 50 minutes counseling the patient face to face. The total time spent in the appointment was 65 minutes and more than 50% was on counseling.     Alla Feeling, NP 06/05/2017   Addendum  I have seen the patient, examined her. I agree with the assessment and and plan and have edited the notes.   Beverly Schwartz is a 47 yo female with past medical history of diabetes, on insulin, hypertension, presented with intermittent abdominal pain last summer, recent rectal bleeding and anemia, colonoscopy showed a sigmoid colon mass, biopsy confirmed adenocarcinoma.  Unfortunately her CT scan showed diffuse liver lesions, highly suspicious for metastatic cancer.  I recommend liver biopsy to confirm the metastasis, and send biopsy tissue for genomic testing foundation one, to see if she is a candidate for immunotherapy or EGFR inhibitor.  I recommended her to start first-line chemotherapy FOLFOX in 2 weeks, sent was obtained today.  Will get port placement by IR.  Nutritional and social worker referral was made today.  Arrange IV iron for her moderate anemia.  All questions were answered.  Truitt Merle  06/05/2017

## 2017-06-07 ENCOUNTER — Encounter: Payer: Self-pay | Admitting: Nurse Practitioner

## 2017-06-08 ENCOUNTER — Telehealth: Payer: Self-pay | Admitting: Nurse Practitioner

## 2017-06-08 ENCOUNTER — Telehealth: Payer: Self-pay

## 2017-06-08 NOTE — Telephone Encounter (Signed)
Called patient to offer support and to see if I could answer any questions after her initial appointment on 06/05/17 with Cira Rue NP and Dr. Burr Medico. I left VM with my direct number for questions or concerns. I will assess for barriers or needs when I can speak with pt directly.

## 2017-06-08 NOTE — Telephone Encounter (Signed)
I called the patient today to follow up on our consult 3/16. She wants to try oral iron first before IV feraheme due to risk of hypersensitivity and anaphylaxis. I encouraged her to begin oral ferrous sulfate 1 tab BID today or this week. Reinforced potential for constipation and to take stool softener or miralax to maintain regular BM. Best absorbed with vitamin C/orange juice but she is diabetic. Will monitor her BG closely. I will send scheduling message to cancel IV Feraheme. Also explained she will be scheduled for baseline echo to evaluate heart function prior to chemo, she understands. I reviewed upcoming liver biopsy, discussed potential risks and side effects. She asked about potential holistic remedies and prayer to treat her cancer. I explained the known benefit of chemotherapy in controlling disease and encouraged her to discuss her concerns further with Dr. Burr Medico after liver biopsy on 3/21. Explained if prayer is helpful to her she should continue. She states she will do whatever is best for her. I sent in-basket to nutrition, SW, and GI navigator to f/u with this patient. Will f/u after liver bx.

## 2017-06-09 ENCOUNTER — Telehealth: Payer: Self-pay | Admitting: Genetic Counselor

## 2017-06-09 ENCOUNTER — Telehealth: Payer: Self-pay | Admitting: Hematology

## 2017-06-09 ENCOUNTER — Encounter: Payer: Self-pay | Admitting: *Deleted

## 2017-06-09 ENCOUNTER — Other Ambulatory Visit: Payer: Self-pay | Admitting: Radiology

## 2017-06-09 NOTE — Telephone Encounter (Signed)
Spoke to patient regarding upcoming march and April appointments.  °

## 2017-06-09 NOTE — Progress Notes (Signed)
Attempted to call patient to discuss navigation needs. Unable to leave VM; mail box if ull.

## 2017-06-09 NOTE — Progress Notes (Signed)
[place] Work  Holiday representative received referral from medical oncology for support and emotional concerns.  CSW contacted patient at home to offer support and assess for psychosocial needs.  CSW left patient a messaging offering support, and information on the support team and support services.  CSW provided contact information and encouraged patient to return call to further discuss needs and resources.   Johnnye Lana, MSW, LCSW, OSW-C Clinical Social Worker [facility] [phone]

## 2017-06-09 NOTE — Telephone Encounter (Signed)
Genetics consultation and nutritionist appt scheduled per 3/15 sch message - patient Is aware of appt date and time.

## 2017-06-10 ENCOUNTER — Encounter: Payer: Self-pay | Admitting: General Practice

## 2017-06-10 NOTE — Progress Notes (Signed)
[place] CSW Progress Note  Appointment scheduled w patient for 3/20 at 3:30 PM.  Wants to discuss communicating w her 45 year old son re cancer diagnosis.  Edwyna Shell, LCSW Clinical Social Worker Phone:  [phone]

## 2017-06-10 NOTE — Progress Notes (Signed)
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   Clinical Social Worker Edwyna Shell met with patient and her mother in office to assess psychosocial, emotional, mental health, and spiritual needs of the patient.  Patient's knowledge about cancer and its treatment including level of understanding, reactions, goals for care, and expectations:  "I am still in shock."  "I want to forget all about it."  Is overwhelmed by severity of cancer diagnosis, learned of cancer approx 2 weeks ago while seeking care for severe stomach pains.  Unsure whether she wants to pursue chemotherapy, concerned that treatment/side effects will limit quality of life available.  Understood that survival rate is "5 - 10%, if that's w chemo, what is the point?"  Mother is researching ways to provide nutritional support in order to deal w cancer. Both wish that nutrition/natural interventions would be sufficient to address cancer.  Want information from MD about likelihood of successful treatment of her cancer vs discomfort/life impacting side effects from chemotherapy.     Characteristics of the patient's support system:  Divorced from husband who lives nearby.  45 year old son Nutritional therapist), "on the autism spectrum."  Son is "easily overwhelmed, we don't want to scare him by our reactions."  Mother is very supportive, but is also caring for her husband/patient's father who has severe cardiovascular disease and is mostly homebound.  Dylan's father lives w his fiancee who is engaged and supportive of Dylan.  Son is currently living w father and fiancee due to patient's multiple tests/appointments/illness.    Patient and family psychosocial functioning including strengths, limitations, and coping skills:  Supportive of each other, father/fiancee on board w providing support and care to son, mother willing to provide transportation and in home care as  needed; however, also has to care for disabled husband in the home.  Son and patient receive significant support from mother.  Mother voices significant grief re patient's diagnosis and possible poor prognosis.  Family normally takes trips in summer to visit family in [place] - this is important to patient and son.  Patient wondering if she will be able to spend quality time w son over summer break.    Identifications of barriers to care:  Uncertainty about efficacy of traditional treatments of cancer, want to explore natural/nutritional options.  Patient and mother are "still in shock", making decision making more difficult as both "wish it would all go away."  Will need support to cope with reality of diagnosis and make informed decisions re care.  Asked "do you know if anyone decides not to treat their cancer?"  School has not been particularly supportive of son over past two weeks despite upheaval in the home as family is coping w new diagnosis of cancer.  Availability of community resources: Supportive school counselor will need additional resources to best support son.  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  Wants to apply for disability, concerned that her health insurance may not be adequate.    Clinical Social Worker follow up needed: Yes.    If yes, follow up plan:  CSW provided packet of resources on [employer], booklet designed for teens whose parents have cancer, comfort bag and blanket. Discussed [employer] and [employer] - gave brochures on these community supports as well.  Encouraged participation in GI support group as well as various other activities designed to promote self care in patients  and caregivers.  CSW will meet w patient after next oncology appointment on 3/27.  Patient's major concern is how to discuss diagnosis and treatment w son - CSW, mother and patient worked on appropriate communication and discussed plan for  speaking w son after next oncology appointment as well as remaining open to questions and support seeking as she moves through the treatment process.  Edwyna Shell, LCSW Clinical Social Worker Phone:  [phone]

## 2017-06-11 ENCOUNTER — Ambulatory Visit (HOSPITAL_COMMUNITY)
Admission: RE | Admit: 2017-06-11 | Discharge: 2017-06-11 | Disposition: A | Discharge status: Home / Self Care | Payer: BLUE CROSS/BLUE SHIELD | Source: Ambulatory Visit | Attending: Nurse Practitioner | Admitting: Nurse Practitioner

## 2017-06-11 ENCOUNTER — Encounter: Payer: Self-pay | Admitting: General Practice

## 2017-06-11 DIAGNOSIS — C187 Malignant neoplasm of sigmoid colon: Secondary | ICD-10-CM | POA: Insufficient documentation

## 2017-06-11 DIAGNOSIS — C787 Secondary malignant neoplasm of liver and intrahepatic bile duct: Secondary | ICD-10-CM | POA: Insufficient documentation

## 2017-06-11 LAB — COMPREHENSIVE METABOLIC PANEL
ALT: 12 U/L — ABNORMAL LOW (ref 14–54)
AST: 25 U/L (ref 15–41)
Albumin: 3.3 g/dL — ABNORMAL LOW (ref 3.5–5.0)
Alkaline Phosphatase: 162 U/L — ABNORMAL HIGH (ref 38–126)
Anion gap: 10 (ref 5–15)
BUN: 12 mg/dL (ref 6–20)
CO2: 24 mmol/L (ref 22–32)
Calcium: 7.9 mg/dL — ABNORMAL LOW (ref 8.9–10.3)
Chloride: 103 mmol/L (ref 101–111)
Creatinine, Ser: 0.86 mg/dL (ref 0.44–1.00)
GFR calc Af Amer: >60 mL/min (ref >60)
GFR calc non Af Amer: >60 mL/min (ref >60)
Glucose, Bld: 178 mg/dL — ABNORMAL HIGH (ref 65–99)
Potassium: 4.2 mmol/L (ref 3.5–5.1)
Sodium: 137 mmol/L (ref 135–145)
Total Bilirubin: 0.6 mg/dL (ref 0.3–1.2)
Total Protein: 7 g/dL (ref 6.5–8.1)

## 2017-06-11 LAB — CBC WITH DIFFERENTIAL/PLATELET
Basophils Absolute: 0 10*3/uL (ref 0.0–0.1)
Basophils Relative: 0 %
Eosinophils Absolute: 1 10*3/uL — ABNORMAL HIGH (ref 0.0–0.7)
Eosinophils Relative: 10 %
HCT: 29.5 % — ABNORMAL LOW (ref 36.0–46.0)
Hemoglobin: 8.8 g/dL — ABNORMAL LOW (ref 12.0–15.0)
Lymphocytes Relative: 23 %
Lymphs Abs: 2.3 10*3/uL (ref 0.7–4.0)
MCH: 21.7 pg — ABNORMAL LOW (ref 26.0–34.0)
MCHC: 29.8 g/dL — ABNORMAL LOW (ref 30.0–36.0)
MCV: 72.7 fL — ABNORMAL LOW (ref 78.0–100.0)
Monocytes Absolute: 0.5 10*3/uL (ref 0.1–1.0)
Monocytes Relative: 5 %
Neutro Abs: 6.4 10*3/uL (ref 1.7–7.7)
Neutrophils Relative %: 62 %
Platelets: 305 10*3/uL (ref 150–400)
RBC: 4.06 MIL/uL (ref 3.87–5.11)
RDW: 16 % — ABNORMAL HIGH (ref 11.5–15.5)
WBC: 10.2 10*3/uL (ref 4.0–10.5)

## 2017-06-11 LAB — PROTIME-INR
INR: 1.04
Prothrombin Time: 13.5 seconds (ref 11.4–15.2)

## 2017-06-11 MED ORDER — HYDROCODONE-ACETAMINOPHEN 5-325 MG PO TABS: 1.0000 | ORAL_TABLET | ORAL | Status: DC | PRN

## 2017-06-11 MED ORDER — MIDAZOLAM HCL 2 MG/2ML IJ SOLN
INTRAMUSCULAR | Status: AC | PRN
  Administered 2017-06-11: 1 mg via INTRAVENOUS

## 2017-06-11 MED ORDER — SODIUM CHLORIDE 0.9 % IV SOLN: INTRAVENOUS | Status: DC

## 2017-06-11 MED ORDER — MIDAZOLAM HCL 2 MG/2ML IJ SOLN
INTRAMUSCULAR | Status: AC
  Filled 2017-06-11: qty 4

## 2017-06-11 MED ORDER — ONDANSETRON HCL 4 MG/2ML IJ SOLN
4.0000 mg | Freq: Once | INTRAMUSCULAR | Status: AC
  Administered 2017-06-11: 4 mg via INTRAVENOUS
  Filled 2017-06-11: qty 2

## 2017-06-11 MED ORDER — LIDOCAINE HCL (PF) 1 % IJ SOLN
INTRAMUSCULAR | Status: AC
  Filled 2017-06-11: qty 30

## 2017-06-11 MED ORDER — ONDANSETRON HCL 4 MG/2ML IJ SOLN
INTRAMUSCULAR | Status: AC
  Administered 2017-06-11: 4 mg via INTRAVENOUS
  Filled 2017-06-11: qty 2

## 2017-06-11 MED ORDER — PROCHLORPERAZINE EDISYLATE 5 MG/ML IJ SOLN
10.0000 mg | INTRAMUSCULAR | Status: DC | PRN
  Filled 2017-06-11: qty 2

## 2017-06-11 MED ORDER — FENTANYL CITRATE (PF) 100 MCG/2ML IJ SOLN
INTRAMUSCULAR | Status: AC
  Filled 2017-06-11: qty 4

## 2017-06-11 MED ORDER — FENTANYL CITRATE (PF) 100 MCG/2ML IJ SOLN
INTRAMUSCULAR | Status: AC | PRN
  Administered 2017-06-11 (×2): 25 ug via INTRAVENOUS

## 2017-06-11 MED ORDER — GELATIN ABSORBABLE 12-7 MM EX MISC
CUTANEOUS | Status: AC
  Filled 2017-06-11: qty 1

## 2017-06-11 NOTE — Procedures (Signed)
  Procedure: US core liver lesion 18g x2 EBL:   minimal Complications:  none immediate  See full dictation in [facility] PACS.  D. Jia Mohamed MD Main # [phone] Pager  [phone]    

## 2017-06-11 NOTE — Discharge Instructions (Signed)
Liver Biopsy, Care After °These instructions give you information on caring for yourself after your procedure. Your doctor may also give you more specific instructions. Call your doctor if you have any problems or questions after your procedure. °Follow these instructions at home: °· Rest at home for 1-2 days or as told by your doctor. °· Have someone stay with you for at least 24 hours. °· Do not do these things in the first 24 hours: °? Drive. °? Use machinery. °? Take care of other people. °? Sign legal documents. °? Take a bath or shower. °· There are many different ways to close and cover a cut (incision). For example, a cut can be closed with stitches, skin glue, or adhesive strips. Follow your doctor's instructions on: °? Taking care of your cut. °? Changing and removing your bandage (dressing). °? Removing whatever was used to close your cut. °· Do not drink alcohol in the first week. °· Do not lift more than 5 pounds or play contact sports for the first 2 weeks. °· Take medicines only as told by your doctor. For 1 week, do not take medicine that has aspirin in it or medicines like ibuprofen. °· Get your test results. °Contact a doctor if: °· A cut bleeds and leaves more than just a small spot of blood. °· A cut is red, puffs up (swells), or hurts more than before. °· Fluid or something else comes from a cut. °· A cut smells bad. °· You have a fever or chills. °Get help right away if: °· You have swelling, bloating, or pain in your belly (abdomen). °· You get dizzy or faint. °· You have a rash. °· You feel sick to your stomach (nauseous) or throw up (vomit). °· You have trouble breathing, feel short of breath, or feel faint. °· Your chest hurts. °· You have problems talking or seeing. °· You have trouble balancing or moving your arms or legs. °This information is not intended to replace advice given to you by your health care provider. Make sure you discuss any questions you have with your health care  provider. °Document Released: 12/18/2007 Document Revised: 08/16/2015 Document Reviewed: 05/06/2013 °Elsevier Interactive Patient Education © 2018 Elsevier Inc. ° °

## 2017-06-11 NOTE — H&P (Signed)
Chief Complaint: Patient was seen in consultation today for liver lesion biopsy at the request of Burton,Lacie K  Referring Physician(s): Burton,Lacie K  Supervising Physician: Arne Cleveland  Patient Status: [facility] - Out-pt  History of Present Illness: Beverly Schwartz is a 47 y.o. female recently diagnosed with colon cancer. She is found to have liver lesions as well. She is scheduled for US guided liver lesion biopsy. PMHx, meds, labs, imaging, allergies reviewed. Has been NPO this am. Mother at bedside  Past Medical History:  Diagnosis Date  . Anxiety   . Cancer ([place])    colon, liver  . Chest pain   . Colon cancer ([place])   . Complication of anesthesia   . Depression   . Diabetes mellitus   . Dizziness   . Hyperlipidemia   . Hypertension   . Hypothyroidism   . Obesity   . PONV (postoperative nausea and vomiting)   . Tachycardia     Past Surgical History:  Procedure Laterality Date  . FLEXIBLE SIGMOIDOSCOPY N/A 05/31/2017   Procedure: FLEXIBLE SIGMOIDOSCOPY;  Surgeon: Clarene Essex, MD;  Location: [facility] ENDOSCOPY;  Service: Endoscopy;  Laterality: N/A;  . WISDOM TOOTH EXTRACTION     age 89's    Allergies: Bupropion and Amoxicillin  Medications: Prior to Admission medications   Medication Sig Start Date End Date Taking? Authorizing Provider  ALPRAZolam Duanne Moron) 1 MG tablet Take 1 tablet (1 mg total) by mouth daily as needed for anxiety. 06/03/17   Danford, Valetta Fuller D, NP  atorvastatin (LIPITOR) 10 MG tablet Take 1 tablet (10 mg total) by mouth daily. 09/15/16   Danford, Valetta Fuller D, NP  CINNAMON PO Take 1 tablet by mouth daily.     [provider]  fluticasone (FLONASE) 50 MCG/ACT nasal spray Place 1 spray into both nostrils daily. Patient taking differently: Place 1 spray into both nostrils daily as needed for allergies.  12/10/16   Danford, Valetta Fuller D, NP  hyoscyamine (LEVSIN SL) 0.125 MG SL tablet Place 1 tablet (0.125 mg total) under the tongue every 6 (six) hours  as needed. Patient taking differently: Place 0.125 mg under the tongue every 6 (six) hours as needed for cramping.  10/23/16   Esterwood, Amy S, PA-C  insulin NPH-regular Human (NOVOLIN 70/30) (70-30) 100 UNIT/ML injection Inject 25 Units into the skin 2 (two) times daily with a meal.     [provider]  levothyroxine (SYNTHROID, LEVOTHROID) 112 MCG tablet Take 112 mcg by mouth daily before breakfast.    [provider]  Omega-3 Fatty Acids (FISH OIL) 1000 MG CAPS Take 1,000 mg by mouth daily.     [provider]  polyethylene glycol (MIRALAX / GLYCOLAX) packet Take 17 g by mouth daily. 06/01/17   Florencia Reasons, MD  propranolol (INDERAL) 10 MG tablet Take 1 tablet (10 mg total) by mouth daily. 06/03/17   Danford, Valetta Fuller D, NP  TURMERIC PO Take 1 capsule by mouth daily.     [provider]     Family History  Problem Relation Age of Onset  . Heart disease Father   . Healthy Mother   . Hyperlipidemia Sister   . Healthy Brother   . Heart disease Paternal Uncle   . Heart attack Paternal Uncle   . Healthy Son   . Breast cancer Maternal Grandmother   . Colon cancer Neg Hx   . Esophageal cancer Neg Hx   . Rectal cancer Neg Hx   . Liver cancer Neg Hx  Social History   Socioeconomic History  . Marital status: Divorced    Spouse name: Not on file  . Number of children: 1  . Years of education: Not on file  . Highest education level: Not on file  Occupational History  . Occupation: Producer, television/film/video  Social Needs  . Financial resource strain: Not on file  . Food insecurity:    Worry: Not on file    Inability: Not on file  . Transportation needs:    Medical: Not on file    Non-medical: Not on file  Tobacco Use  . Smoking status: Never Smoker  . Smokeless tobacco: Never Used  Substance and Sexual Activity  . Alcohol use: No  . Drug use: No  . Sexual activity: Never  Lifestyle  . Physical activity:    Days per week: Not on file    Minutes per  session: Not on file  . Stress: Not on file  Relationships  . Social connections:    Talks on phone: Not on file    Gets together: Not on file    Attends religious service: Not on file    Active member of club or organization: Not on file    Attends meetings of clubs or organizations: Not on file    Relationship status: Not on file  Other Topics Concern  . Not on file  Social History Narrative  . Not on file     Review of Systems: A 12 point ROS discussed and pertinent positives are indicated in the HPI above.  All other systems are negative.  Review of Systems  Vital Signs: BP (!) 144/102   Pulse 84   Temp 98.4 F (36.9 C)   Resp 20   Ht 5\' 5"  (1.651 m)   Wt 160 lb (72.6 kg)   LMP 05/22/2017   SpO2 100%   BMI 26.63 kg/m   Physical Exam  Constitutional: She is oriented to person, place, and time. She appears well-developed. No distress.  HENT:  Head: Normocephalic.  Mouth/Throat: Oropharynx is clear and moist.  Cardiovascular: Normal rate, regular rhythm and normal heart sounds.  Pulmonary/Chest: Effort normal and breath sounds normal. No respiratory distress.  Abdominal: Soft.  Neurological: She is oriented to person, place, and time.  Psychiatric: She has a normal mood and affect.    Imaging: Dg Chest 2 View  Result Date: 05/31/2017 CLINICAL DATA:  Fever EXAM: CHEST - 2 VIEW COMPARISON:  None. FINDINGS: Heart and mediastinal contours are within normal limits. No focal opacities or effusions. No acute bony abnormality. IMPRESSION: No active cardiopulmonary disease. Electronically Signed   By: Rolm Baptise M.D.   On: 05/31/2017 09:10   Ct Chest W Contrast  Result Date: 05/31/2017 CLINICAL DATA:  Cancer diagnosis yesterday. 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 Current exam is for staging. EXAM: CT CHEST WITH CONTRAST TECHNIQUE: Multidetector CT imaging of the chest was performed during  intravenous contrast administration. CONTRAST:  106mL ISOVUE-300 IOPAMIDOL (ISOVUE-300) INJECTION 61% COMPARISON:  Abdomen and pelvis CT, 05/30/2017. FINDINGS: Cardiovascular: Heart is normal size and configuration. No pericardial effusion. Minor left coronary artery calcifications. Great vessels normal in caliber. Minor descending thoracic aortic atherosclerosis. Mediastinum/Nodes: No enlarged mediastinal, hilar, or axillary lymph nodes. Thyroid gland, trachea, and esophagus demonstrate no significant findings. Lungs/Pleura: Minor areas of subsegmental atelectasis. Lungs otherwise clear. Specifically, no masses or nodules. No pleural effusion.  No pneumothorax. Upper Abdomen: Multiple liver metastatic lesions as  described on the previous day's abdomen and pelvis CT. Musculoskeletal: No fracture or acute finding. No osteoblastic or osteolytic lesions. IMPRESSION: 1. No evidence of metastatic disease in the chest. 2. No acute findings. Aortic Atherosclerosis (ICD10-I70.0). Electronically Signed   By: Lajean Manes M.D.   On: 05/31/2017 18:14   Ct Abdomen Pelvis W Contrast  Result Date: 05/30/2017 CLINICAL DATA:  Left lower quadrant pain. Symptoms for 3 days. Elevated white blood cell count. Diabetes. Hypertension. Diverticulitis suspected. EXAM: CT ABDOMEN AND PELVIS WITH CONTRAST TECHNIQUE: Multidetector CT imaging of the abdomen and pelvis was performed using the standard protocol following bolus administration of intravenous contrast. CONTRAST:  124mL ISOVUE-300 IOPAMIDOL (ISOVUE-300) INJECTION 61% COMPARISON:  Ultrasound of 06/09/2016. FINDINGS: Lower chest: Clear lung bases. Normal heart size without pericardial or pleural effusion. Hepatobiliary: Hepatomegaly at 22.6 cm craniocaudal. Large volume bilateral hepatic masses. Index lateral segment left liver lobe lesion measures 5.4 x 5.6 cm on image 12/2. Dominant segment 4 lesion measures 8.1 x 6.5 cm. Normal gallbladder, without biliary ductal dilatation.  Pancreas: Normal, without mass or ductal dilatation. Spleen: Normal in size, without focal abnormality. Adrenals/Urinary Tract: Normal adrenal glands. Normal kidneys, without hydronephrosis. Normal urinary bladder. Stomach/Bowel: Normal stomach, without wall thickening. Irregular moderate rectosigmoid wall thickening, including on image 70/2. Sagittal image 57/6. Large stool burden more proximally, suggesting a component of obstruction. Normal terminal ileum and appendix. Normal small bowel. Vascular/Lymphatic: Mild but age advanced aortic atherosclerosis. Portacaval node of 2.0 cm on image 26/2. Adenopathy within the sigmoid mesocolon, including at 8 mm on image 63/2 and 11 mm on image 64/2. Reproductive: The uterus is positioned eccentric left. No adnexal mass. Other: No significant free fluid. No evidence of omental or peritoneal disease. Musculoskeletal: Transitional S1 vertebral body. IMPRESSION: 1. 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 Abdominopelvic adenopathy. Rectosigmoid mass with suggestion of partial obstruction as evidenced by large colonic stool burden more proximally. 2.  Aortic Atherosclerosis (ICD10-I70.0).  This is age advanced. Electronically Signed   By: Abigail Miyamoto M.D.   On: 05/30/2017 17:19    Labs:  CBC: Recent Labs    05/30/17 1525 05/31/17 0301 06/01/17 0527 06/03/17 1008  WBC 14.1* 9.1 8.4 10.4  HGB 10.2* 8.6* 8.6* 8.7*  HCT 33.9* 29.7* 28.5* 28.8*  PLT 396 295 265 323    COAGS: Recent Labs    05/30/17 2350  INR 1.05  APTT 35    BMP: Recent Labs    05/30/17 1525 05/31/17 0301 06/01/17 0527 06/03/17 1008  NA 138 143 138 145*  K 3.4* 3.4* 3.6 4.3  CL 102 109 105 105  CO2 25 24 24 24   GLUCOSE 197* 173* 200* 113*  BUN 10 5* 6 10  CALCIUM 7.8* 7.0* 7.6* 8.0*  CREATININE 0.72 0.60 0.65 0.71  GFRNONAA >60 >60 >60 104  GFRAA >60 >60 >60 120    LIVER FUNCTION TESTS: Recent Labs     05/30/17 1525 05/31/17 0301 06/01/17 0527 06/03/17 1008  BILITOT 0.2* 0.4 0.0* 0.2  AST 30 23 20 26   ALT 16 13* 12* 12  ALKPHOS 174* 141* 133* 163*  PROT 8.4* 6.8 6.7 6.8  ALBUMIN 4.0 3.2* 3.0* 3.9    TUMOR MARKERS: No results for input(s): AFPTM, CEA, CA199, CHROMGRNA in the last 8760 hours.  Assessment and Plan: Liver lesions Colon cancer For US guided liver lesion biopsy. Labs pending Risks and benefits discussed with the patient including, but not limited to bleeding, infection, damage to adjacent  structures or low yield requiring additional tests.  All of the patient's questions were answered, patient is agreeable to proceed. Consent signed and in chart.    Thank you for this interesting consult.  I greatly enjoyed meeting ELIN FENLEY and look forward to participating in their care.  A copy of this report was sent to the requesting provider on this date.  Electronically Signed: Ascencion Dike, PA-C 06/11/2017, 12:11 PM   I spent a total of 20 minutes in face to face in clinical consultation, greater than 50% of which was counseling/coordinating care for liver biopsy

## 2017-06-11 NOTE — Progress Notes (Signed)
[place] CSW Progress Notes  Referral sent to [facility] to assist w application for disability benefits.  Edwyna Shell, LCSW Clinical Social Worker Phone:  [phone]

## 2017-06-12 ENCOUNTER — Other Ambulatory Visit: Payer: Self-pay

## 2017-06-12 ENCOUNTER — Ambulatory Visit: Payer: Self-pay

## 2017-06-15 ENCOUNTER — Telehealth: Payer: Self-pay | Admitting: Hematology

## 2017-06-15 ENCOUNTER — Encounter: Payer: Self-pay | Admitting: Hematology

## 2017-06-15 ENCOUNTER — Inpatient Hospital Stay (HOSPITAL_BASED_OUTPATIENT_CLINIC_OR_DEPARTMENT_OTHER): Payer: BLUE CROSS/BLUE SHIELD | Admitting: Hematology

## 2017-06-15 ENCOUNTER — Ambulatory Visit (HOSPITAL_COMMUNITY)
Admission: RE | Admit: 2017-06-15 | Discharge: 2017-06-15 | Disposition: A | Discharge status: Home / Self Care | Payer: BLUE CROSS/BLUE SHIELD | Source: Ambulatory Visit | Attending: Nurse Practitioner | Admitting: Nurse Practitioner

## 2017-06-15 ENCOUNTER — Encounter: Payer: Self-pay | Admitting: *Deleted

## 2017-06-15 VITALS — BP 155/90 | HR 79 | Temp 98.4°F | Resp 18 | Ht 65.0 in | Wt 159.2 lb

## 2017-06-15 DIAGNOSIS — I119 Hypertensive heart disease without heart failure: Secondary | ICD-10-CM | POA: Diagnosis not present

## 2017-06-15 DIAGNOSIS — I700 Atherosclerosis of aorta: Secondary | ICD-10-CM | POA: Diagnosis not present

## 2017-06-15 DIAGNOSIS — I77810 Thoracic aortic ectasia: Secondary | ICD-10-CM | POA: Diagnosis not present

## 2017-06-15 DIAGNOSIS — E669 Obesity, unspecified: Secondary | ICD-10-CM | POA: Diagnosis not present

## 2017-06-15 DIAGNOSIS — J449 Chronic obstructive pulmonary disease, unspecified: Secondary | ICD-10-CM | POA: Diagnosis not present

## 2017-06-15 DIAGNOSIS — C184 Malignant neoplasm of transverse colon: Secondary | ICD-10-CM

## 2017-06-15 DIAGNOSIS — Z6826 Body mass index (BMI) 26.0-26.9, adult: Secondary | ICD-10-CM | POA: Diagnosis not present

## 2017-06-15 DIAGNOSIS — I209 Angina pectoris, unspecified: Secondary | ICD-10-CM | POA: Diagnosis not present

## 2017-06-15 DIAGNOSIS — C787 Secondary malignant neoplasm of liver and intrahepatic bile duct: Secondary | ICD-10-CM | POA: Diagnosis not present

## 2017-06-15 DIAGNOSIS — I10 Essential (primary) hypertension: Secondary | ICD-10-CM

## 2017-06-15 DIAGNOSIS — D509 Iron deficiency anemia, unspecified: Secondary | ICD-10-CM | POA: Diagnosis not present

## 2017-06-15 DIAGNOSIS — E119 Type 2 diabetes mellitus without complications: Secondary | ICD-10-CM

## 2017-06-15 DIAGNOSIS — E785 Hyperlipidemia, unspecified: Secondary | ICD-10-CM | POA: Insufficient documentation

## 2017-06-15 DIAGNOSIS — Z7189 Other specified counseling: Secondary | ICD-10-CM

## 2017-06-15 DIAGNOSIS — C187 Malignant neoplasm of sigmoid colon: Secondary | ICD-10-CM | POA: Diagnosis not present

## 2017-06-15 DIAGNOSIS — Z85038 Personal history of other malignant neoplasm of large intestine: Secondary | ICD-10-CM | POA: Insufficient documentation

## 2017-06-15 DIAGNOSIS — E039 Hypothyroidism, unspecified: Secondary | ICD-10-CM | POA: Diagnosis not present

## 2017-06-15 NOTE — Telephone Encounter (Signed)
Left message for patient regarding appointment per 3/25, sched message

## 2017-06-15 NOTE — Progress Notes (Signed)
  Oncology Nurse Navigator Documentation  Navigator Location: [facility] (06/15/17 1121)   )Navigator Encounter Type: Telephone (06/15/17 1121) Telephone: Outgoing Call (06/15/17 1121) Called patient and left VM asking her to return my call so that I can assess for navigation needs and answer any questions.                                        Acuity: Level 2 (06/15/17 1121)         Time Spent with Patient: 15 (06/15/17 1121)

## 2017-06-15 NOTE — Progress Notes (Signed)
[place]  [phone] [phone]  Clinic Follow up Note   Patient Care Team: Esaw Grandchild, NP as PCP - General (Family Medicine) Delrae Rend, MD as Consulting Physician (Endocrinology) Truitt Merle, MD as Consulting Physician (Hematology) Alla Feeling, NP as Nurse Practitioner (Nurse Practitioner) Clarene Essex, MD as Consulting Physician (Gastroenterology) 06/15/2017   CHIEF COMPLAIN: f/u metastatic sigmoid colon cancer  SUMMARY OF ONCOLOGIC HISTORY: Oncology History   Cancer Staging No matching staging information was found for the patient.       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 Abdominopelvic adenopathy. Rectosigmoid mass with suggestion of partial obstruction as evidenced by large colonic stool burden more proximally. 2.  Aortic Atherosclerosis (ICD10-I70.0).  This is age advanced.      05/31/2017 Tumor Marker    CEA 353.3 (elevated) AFP 4.5 (normal)      05/31/2017 Initial Biopsy    Diagnosis Colon, biopsy, sigmoid - INVASIVE ADENOCARCINOMA - SEE COMMENT      05/31/2017 Imaging    CT CHEST IMPRESSION: 1. No evidence of metastatic disease in the chest. 2. No acute findings.  Aortic Atherosclerosis (ICD10-I70.0).       05/31/2017 Procedure    Colonoscopy per Dr. Watt Climes Findings: An infiltrative and ulcerated partially obstructing medium-sized mass was found in the recto-sigmoid colon. The mass was circumferential. The mass measured four cm in length. No bleeding was present.   Impression - One small polyp in the rectum. - The examination was otherwise normal. - Malignant partially obstructing tumor in the recto-sigmoid colon. Biopsied. - One medium polyp in the proximal sigmoid colon. - The examination was otherwise normal. - Internal hemorrhoids.      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  She did a liver biopsy last week and tolerated procedure well.  She overall feels about the same, has mild intermittent nausea and abdominal cramps, overall manageable.  Mild intermittent rectal bleeding, no other new complaints.  Patient and her mom have many questions about treatment, and requested to be seen today before her scheduled chemo and follow-up later this week.  REVIEW OF SYSTEMS:   Constitutional: Denies fevers, chills or abnormal weight loss, (+) mild fatigue and low appetite Eyes: Denies blurriness of vision Ears, nose, mouth, throat, and face: Denies mucositis or sore throat Respiratory: Denies cough, dyspnea or wheezes Cardiovascular: Denies palpitation, chest discomfort or lower extremity swelling Gastrointestinal: Intermittent nausea and abdominal cramps, mild intermittent hematochezia, she is on MiraLAX for constipation Skin: Denies abnormal skin rashes Lymphatics: Denies new lymphadenopathy or easy bruising Neurological:Denies numbness, tingling or new weaknesses Behavioral/Psych: Mood is stable, no new changes  All other systems were reviewed with the patient and are negative.  MEDICAL HISTORY:  Past Medical History:  Diagnosis Date  . Anxiety   . Cancer ([place])    colon, liver  . Chest pain   . Colon cancer ([place])   . Complication of anesthesia   . Depression   . Diabetes mellitus   . Dizziness   . Hyperlipidemia   . Hypertension   . Hypothyroidism   . Obesity   . PONV (postoperative nausea and vomiting)   . Tachycardia     SURGICAL HISTORY: Past Surgical History:  Procedure  Laterality Date  . FLEXIBLE SIGMOIDOSCOPY N/A 05/31/2017   Procedure: FLEXIBLE SIGMOIDOSCOPY;  Surgeon: Clarene Essex, MD;  Location: [facility];  Service: Endoscopy;  Laterality: N/A;  . WISDOM  TOOTH EXTRACTION     age 68's    I have reviewed the social history and family history with the patient and they are unchanged from previous note.  ALLERGIES:  is allergic to bupropion and amoxicillin.  MEDICATIONS:  Current Outpatient Medications  Medication Sig Dispense Refill  . ALPRAZolam (XANAX) 1 MG tablet Take 1 tablet (1 mg total) by mouth daily as needed for anxiety. 20 tablet 0  . atorvastatin (LIPITOR) 10 MG tablet Take 1 tablet (10 mg total) by mouth daily. 90 tablet 3  . CINNAMON PO Take 1 tablet by mouth daily.     . fluticasone (FLONASE) 50 MCG/ACT nasal spray Place 1 spray into both nostrils daily. (Patient taking differently: Place 1 spray into both nostrils daily as needed for allergies. ) 16 g 2  . hyoscyamine (LEVSIN SL) 0.125 MG SL tablet Place 1 tablet (0.125 mg total) under the tongue every 6 (six) hours as needed. (Patient taking differently: Place 0.125 mg under the tongue every 6 (six) hours as needed for cramping. ) 40 tablet 2  . insulin NPH-regular Human (NOVOLIN 70/30) (70-30) 100 UNIT/ML injection Inject 25 Units into the skin 2 (two) times daily with a meal.     . levothyroxine (SYNTHROID, LEVOTHROID) 112 MCG tablet Take 112 mcg by mouth daily before breakfast.    . Omega-3 Fatty Acids (FISH OIL) 1000 MG CAPS Take 1,000 mg by mouth daily.     . polyethylene glycol (MIRALAX / GLYCOLAX) packet Take 17 g by mouth daily. 14 each 0  . propranolol (INDERAL) 10 MG tablet Take 1 tablet (10 mg total) by mouth daily. 90 tablet 0  . TURMERIC PO Take 1 capsule by mouth daily.      No current facility-administered medications for this visit.     PHYSICAL EXAMINATION: ECOG PERFORMANCE STATUS: 1 - Symptomatic but completely ambulatory  Vitals:   06/15/17 1221  BP: (!) 155/90  Pulse: 79  Resp: 18  Temp: 98.4 F (36.9 C)  SpO2: 100%   Filed Weights   06/15/17 1221  Weight: 159 lb 3.2 oz (72.2 kg)    GENERAL:alert, no distress and comfortable SKIN: skin  color, texture, turgor are normal, no rashes or significant lesions EYES: normal, Conjunctiva are pink and non-injected, sclera clear OROPHARYNX:no exudate, no erythema and lips, buccal mucosa, and tongue normal  NECK: supple, thyroid normal size, non-tender, without nodularity LYMPH:  no palpable lymphadenopathy in the cervical, axillary or inguinal LUNGS: clear to auscultation and percussion with normal breathing effort HEART: regular rate & rhythm and no murmurs and no lower extremity edema ABDOMEN:abdomen soft, (+) hepatomegaly  Musculoskeletal:no cyanosis of digits and no clubbing  NEURO: alert & oriented x 3 with fluent speech, no focal motor/sensory deficits  LABORATORY DATA:  I have reviewed the data as listed CBC Latest Ref Rng & Units 06/11/2017 06/03/2017 06/01/2017  WBC 4.0 - 10.5 K/uL 10.2 10.4 8.4  Hemoglobin 12.0 - 15.0 g/dL 8.8(L) 8.7(L) 8.6(L)  Hematocrit 36.0 - 46.0 % 29.5(L) 28.8(L) 28.5(L)  Platelets 150 - 400 K/uL 305 323 265     CMP Latest Ref Rng & Units 06/11/2017 06/03/2017 06/01/2017  Glucose 65 - 99 mg/dL 178(H) 113(H) 200(H)  BUN 6 - 20 mg/dL '12 10 6  '$ Creatinine 0.44 - 1.00 mg/dL 0.86 0.71 0.65  Sodium 135 - 145 mmol/L 137 145(H) 138  Potassium 3.5 - 5.1 mmol/L 4.2 4.3 3.6  Chloride 101 - 111 mmol/L 103 105 105  CO2 22 - 32 mmol/L '24 24 24  '$ Calcium 8.9 - 10.3 mg/dL 7.9(L) 8.0(L) 7.6(L)  Total Protein 6.5 - 8.1 g/dL 7.0 6.8 6.7  Total Bilirubin 0.3 - 1.2 mg/dL 0.6 0.2 0.0(L)  Alkaline Phos 38 - 126 U/L 162(H) 163(H) 133(H)  AST 15 - 41 U/L '25 26 20  '$ ALT 14 - 54 U/L 12(L) 12 12(L)   PATHOLOGY REPORT:  Diagnosis 06/11/2017 Liver, needle/core biopsy - METASTATIC ADENOCARCINOMA, CONSISTENT WITH COLONIC PRIMARY.   RADIOGRAPHIC STUDIES: I have personally reviewed the radiological images as listed and agreed with the findings in the report. No results found.   ASSESSMENT & PLAN: 47 y.o. [place] female, with past medical history of diabetes, hypertension,  presented with left lower quadrant abdominal pain, hematochezia, fatigue and weight loss  1. Sigmoid adenocarcinoma, with abdominopelvic adenopathy and hepatic metastasis, stage IV, MSI-stable  -We reviewed medical records including imaging and pathology with the patient and family in detail. She has what appears to be metastatic sigmoid adenocarcinoma to abdominopelvic lymph nodes and liver.  -I discussed her liver biopsy results with patient and her mother, which confirmed metastatic colon cancer.  I have requested her biopsy to be sent out for genomic testing foundation one -Diffuse liver metastasis, her cancer unfortunately is incurable.  We discussed the goal of therapy is palliative to prolong her life and preserve her quality of life. -We reviewed the natural history of metastatic colon cancer, the median survival is usually about a year without treatment, and median survival usually 2-3 years with systemic chemotherapy and biological agent.  I discussed that left side colon cancer has overall better prognosis than right side colon cancer. -We again discussed the indication for surgery, such as severe bleeding, bowel obstruction.  I think she needs surgery at this point, I strongly encouraged her to consider systemic therapy, such as [id number].  -I again reviewed the potential side effects from chemotherapy.  Patient voiced good understanding, she would like to more time to think about chemo, she likely would pursue, but would like to postpone her treatment for 2-3 weeks due to her son being with her for the next two weeks.  -Patient's mother had many questions about the benefit and side effects of chemotherapy, and seems to be interested in alternative therapies.  States they need more time to think about it. -Patient or her mother will call me when she is ready to schedule her chemotherapy.  2. Anemia, Iron deficiency -She had mild anemia in 02/2015, Hgb 11.8; do not have records previous to  that.  She has microcytic, hypochromic anemia on 05/30/2017 at presentation to ER.  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   -Due to her moderate anemia, and pending chemotherapy, I recommended to intravenous iron.  She declined.  She has started oral iron.  3. DM, HTN, HL -We discussed possible effects of chemotherapy and steroid use on her DM, she will need to check BG closely and possibly adjust insulin if hyperglycemia is not well controlled; we reviewed the importance of tight glucose control while on chemotherapy.  4. Weight loss -presented with 35 lbs weight loss, some of which was intentional. Referral to nutrition to follow while on chemotherapy. We reviewed importance of adequate nutrition in order to tolerate chemotherapy  -  Appointment with dietitian tomorrow, but she wants to schedule due to conflict of her other schedule   5.  Genetics -Due to her young age and personal history of colon cancer, she qualifies for genetics counseling to rule out inheritable cancer syndrome such as Lynch syndrome.  She agrees, referral was made   6. Social issues -She is a single parent with 11 year old son; her child's father is involved. Patient currently lives with her mother and has social support. She has met our SW  7. Goal of care discussion  -We again discussed the incurable nature of her cancer, and the overall poor prognosis, especially if she does not have good response to chemotherapy or progress on chemo -The patient understands the goal of care is palliative. -she is full code for now    PLAN: -Per patient's request, will reschedule her dietitian consult tomorrow, and cancel scheduled chemotherapy for now  -She plan to come in tomorrow for chemo class -Patient or her mother will call me when she is ready to schedule her chemotherapy [place]  -Patient agreed to have port placement by IR the week of April  8 -I Requested her liver biopsy to be sent out for foundation one      No problem-specific Assessment & Plan notes found for this encounter.   No orders of the defined types were placed in this encounter.  All questions were answered. The patient knows to call the clinic with any problems, questions or concerns. No barriers to learning was detected.  I spent 30 minutes counseling the patient face to face. The total time spent in the appointment was 40 minutes and more than 50% was on counseling and review of test results     Truitt Merle, MD 06/15/17

## 2017-06-15 NOTE — Progress Notes (Signed)
  Echocardiogram 2D Echocardiogram has been performed.  Beverly Schwartz 06/15/2017, 11:52 AM

## 2017-06-16 ENCOUNTER — Encounter: Payer: Self-pay | Admitting: General Practice

## 2017-06-16 ENCOUNTER — Inpatient Hospital Stay: Payer: BLUE CROSS/BLUE SHIELD

## 2017-06-16 ENCOUNTER — Encounter: Payer: Self-pay | Admitting: Nutrition

## 2017-06-16 NOTE — Progress Notes (Signed)
[facility] CSW Progress Notes  Proof of filing for disability benefits received from [facility].  Edwyna Shell, LCSW Clinical Social Worker Phone:  [phone]

## 2017-06-17 ENCOUNTER — Ambulatory Visit: Payer: Self-pay | Admitting: Hematology

## 2017-06-18 ENCOUNTER — Encounter: Payer: Self-pay | Admitting: General Practice

## 2017-06-18 NOTE — Progress Notes (Signed)
[place] Spiritual Care Note  Referred by a mutual friend for spiritual and emotional support. Franklyn Lor by phone, introducing [place], noting that I work alongside Information systems manager and [employer]/RD. Assured pt confidentiality and HIPAA compliance regardless of social connections.  Maudie Mercury was very receptive to conversation, requests f/u emotional support from [facility] (will place referral) and f/u phone calls from [place]. Provided [employer] information for pt's son, almost 6. Provided emotional support, normalizing feelings.  Plan: Mail full packet of [place] and AD forms per request. F/u by phone next week per request. Following for support, but please also page as needs arise/circumstances change. Thank you.   [place], [place], [facility] Pager [phone] Voicemail [phone]

## 2017-06-19 ENCOUNTER — Ambulatory Visit: Payer: Self-pay | Admitting: Nurse Practitioner

## 2017-06-19 ENCOUNTER — Ambulatory Visit: Payer: Self-pay

## 2017-06-19 ENCOUNTER — Other Ambulatory Visit: Payer: Self-pay

## 2017-06-23 ENCOUNTER — Telehealth: Payer: Self-pay | Admitting: Hematology

## 2017-06-23 NOTE — Telephone Encounter (Signed)
Patient called to r/s nutritionist appointment to 4/9 per her request.

## 2017-06-23 NOTE — Progress Notes (Signed)
Called patient's mother and left VM requesting that she call me back so that I can assess for learning /education al needs.

## 2017-06-25 ENCOUNTER — Encounter: Payer: Self-pay | Admitting: General Practice

## 2017-06-25 NOTE — Progress Notes (Signed)
[facility] Spiritual Care Note  LVM of care and support, encouraging callback as desired. Plan to f/u by phone next week if I don't hear back.   [place], [place], [facility] Pager [phone] Voicemail [phone]

## 2017-06-29 ENCOUNTER — Encounter: Payer: Self-pay | Admitting: General Practice

## 2017-06-29 NOTE — Progress Notes (Signed)
[facility] Spiritual Care Note  Left f/u vm of support and encouragement.   [place], [place], [facility] Pager [phone] Voicemail [phone]

## 2017-06-30 ENCOUNTER — Telehealth: Payer: Self-pay | Admitting: Nurse Practitioner

## 2017-06-30 ENCOUNTER — Encounter (HOSPITAL_COMMUNITY): Payer: Self-pay | Admitting: Hematology

## 2017-06-30 ENCOUNTER — Inpatient Hospital Stay: Payer: BLUE CROSS/BLUE SHIELD | Attending: Hematology

## 2017-06-30 DIAGNOSIS — E039 Hypothyroidism, unspecified: Secondary | ICD-10-CM | POA: Insufficient documentation

## 2017-06-30 DIAGNOSIS — E119 Type 2 diabetes mellitus without complications: Secondary | ICD-10-CM | POA: Insufficient documentation

## 2017-06-30 DIAGNOSIS — C184 Malignant neoplasm of transverse colon: Secondary | ICD-10-CM | POA: Insufficient documentation

## 2017-06-30 DIAGNOSIS — C787 Secondary malignant neoplasm of liver and intrahepatic bile duct: Secondary | ICD-10-CM | POA: Insufficient documentation

## 2017-06-30 DIAGNOSIS — F329 Major depressive disorder, single episode, unspecified: Secondary | ICD-10-CM | POA: Insufficient documentation

## 2017-06-30 NOTE — Progress Notes (Signed)
Nutrition Assessment   Reason for Assessment:   Patient wanting nutrition information.    ASSESSMENT:  47 year old female with metastatic sigmoid colon cancer.  Past medical history DM, HLD, obesity.    Met with patient and sister in clinic this am.  Patient reports that she does not want to undergo any treatment at this time.  Wanting to know what foods she can eat and supplements she can take to get rid of her cancer.  Reports that she is drinking alkaline water.  Reports that she has cut out sugar in her diet.  Reports that she is juicing and eating mainly vegan except for fish.  Reports that overall appetite is decreased but she is still trying to eat.   Nutrition Focused Physical Exam: deferred   Medications: reviewed  Labs: reviewed  Anthropometrics:   Height: 65 inches Weight: 159 lb 3.2 oz UBW: lost 35-40 lb in the last year but some of that was intentional BMI: 26   Estimated Energy Needs  Kcals: 2100-2500 calories/d Protein: 108-125 g/d Fluid: 2.5  NUTRITION DIAGNOSIS: food and nutrition related knowledge deficit related to cancer as evidenced by patient with questions regarding foods and cancer   INTERVENTION:   Discussed evidenced-based research recommendations regarding nutrition and cancer.   Addressed patients questions regarding supplements, sugar, alkaline water, etc using evidenced-based research Provided patient with reliable resources that she and sister can review to make informed decisions.   Contact information provided to patient  MONITORING, EVALUATION, GOAL: none at this time   NEXT VISIT: no follow-up planned at this time.  Contact information provided and patient can contact RD with questions or concerns.    Jaylissa Felty B. Zenia Resides, [place], [place] Registered Dietitian [phone] (pager)

## 2017-06-30 NOTE — Telephone Encounter (Signed)
Attempted to reach patient to discuss FO results, which indicate tumor is KRAS/NRAS wild type and she is a candidate for EGFR inhibitor panitumumab in addition to chemotherapy if she elects treatment. Voicemail full, not able to leave message; will continue to try to reach patient.

## 2017-07-01 ENCOUNTER — Encounter: Payer: Self-pay | Admitting: Hematology

## 2017-07-03 ENCOUNTER — Other Ambulatory Visit: Payer: Self-pay

## 2017-07-03 ENCOUNTER — Ambulatory Visit: Payer: Self-pay | Admitting: Nurse Practitioner

## 2017-07-03 ENCOUNTER — Ambulatory Visit: Payer: Self-pay

## 2017-07-03 NOTE — Progress Notes (Signed)
Called and spoke directly to patient. Patient agreed to come in to see Cira Rue NP at 9:30 Am and to have labs drawn at 10:45. Patient verbalize understanding. I was able to review with the patient  the FO results that were documented by Lacie in the notes.

## 2017-07-06 NOTE — Progress Notes (Addendum)
[place]  [phone] [phone]) [phone]  Clinic Follow up Note   Patient Care Team: Esaw Grandchild, NP as PCP - General (Family Medicine) Delrae Rend, MD as Consulting Physician (Endocrinology) Truitt Merle, MD as Consulting Physician (Hematology) Alla Feeling, NP as Nurse Practitioner (Nurse Practitioner) Clarene Essex, MD as Consulting Physician (Gastroenterology) 07/07/2017  SUMMARY OF ONCOLOGIC HISTORY: Oncology History   Cancer Staging Malignant neoplasm of transverse colon [facility]) Staging form: Colon and Rectum, AJCC 8th Edition - Clinical stage from 06/11/2017: Stage IVA (cTX, cN1b, pM1a) - Signed by Truitt Merle, MD on 06/15/2017       Malignant neoplasm of transverse colon ([place])   05/30/2017 Imaging    CT AP IMPRESSION: 1. 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 Abdominopelvic adenopathy. Rectosigmoid mass with suggestion of partial obstruction as evidenced by large colonic stool burden more proximally. 2.  Aortic Atherosclerosis (ICD10-I70.0).  This is age advanced.      05/31/2017 Tumor Marker    CEA 353.3 (elevated) AFP 4.5 (normal)      05/31/2017 Initial Biopsy    Diagnosis Colon, biopsy, sigmoid - INVASIVE ADENOCARCINOMA - SEE COMMENT      05/31/2017 Imaging    CT CHEST IMPRESSION: 1. No evidence of metastatic disease in the chest. 2. No acute findings.  Aortic Atherosclerosis (ICD10-I70.0).       05/31/2017 Procedure    Colonoscopy per Dr. Watt Climes Findings: An infiltrative and ulcerated partially obstructing medium-sized mass was found in the recto-sigmoid colon. The mass was circumferential. The mass measured four cm in length. No bleeding was present.   Impression - One small polyp in the rectum. - The examination was otherwise normal. - Malignant partially obstructing tumor in the recto-sigmoid colon. Biopsied. - One medium polyp in the proximal sigmoid  colon. - The examination was otherwise normal. - Internal hemorrhoids.      06/03/2017 Initial Diagnosis    Malignant neoplasm of transverse colon ([place])      06/11/2017 Cancer Staging    Staging form: Colon and Rectum, AJCC 8th Edition - Clinical stage from 06/11/2017: Stage IVA (cTX, cN1b, pM1a) - Signed by Truitt Merle, MD on 06/15/2017      06/11/2017 Pathology Results    Liver biopsy confirmed metastatic colon cancer      07/07/2017 -  Chemotherapy    PENDING FOLFOX/panitumumab q2 weeks starting ~07/28/17      CURRENT THERAPY: PENDING 1st line FOLFOX/panitumumab q2 weeks   INTERVAL HISTORY: Ms. Roseman returns for follow up to discuss [facility] report. She has been exploring holistic remedies such as energy therapy and taking supplements like mushroom, essiac tea, and kombucha tea as well as eating a healthy diet without added sugar. She requests a CT scan to see if any of these remedies has helped her cancer. Otherwise she is doing well with stable fatigue. Having intermittent mild LLQ and RUQ pain, worse after eating especially spicey foods. She has not tried anything for pain. Having regular BM, no recent blood in stool. Appetite is improving. Developed intermittent bilateral R>L calf pain for 2 weeks, worse at night and occurs at rest. No injury. She has been depressed lately and is tearful today. Denies SI. Would like to talk to SW. Has h/o depression and does not tolerate bupropion. Her son is aware of her diagnosis and appears to be handling it well, he is getting good grades in school, no obvious behavior changes. He did cry once and that was  hard for her. He spends week days at his father's and weekends with her.   REVIEW OF SYSTEMS:   Constitutional: Denies fevers, chills or abnormal weight loss (+) fatigue, stable (+) appetite improving  Eyes: Denies blurriness of vision Ears, nose, mouth, throat, and face: Denies mucositis or sore throat Respiratory: Denies dyspnea or wheezes  (+) occasional dry cough Cardiovascular: Denies palpitation, chest discomfort (+) lower extremity swelling, L>R  Gastrointestinal:  Denies nausea, vomiting, constipation, diarrhea, hematochezia, heartburn or change in bowel habits (+) intermittent mild sharpe LLQ and RUQ pain, worse after eating  Skin: Denies abnormal skin rashes (+) acne; face, scalp  Lymphatics: Denies new lymphadenopathy or easy bruising Neurological:Denies numbness, tingling or new weaknesses Behavioral/Psych: (+) depression (+) tearful (+) sad. Denies SI/HI All other systems were reviewed with the patient and are negative.  MEDICAL HISTORY:  Past Medical History:  Diagnosis Date  . Anxiety   . Cancer ([place])    colon, liver  . Chest pain   . Colon cancer ([place])   . Complication of anesthesia   . Depression   . Diabetes mellitus   . Dizziness   . Hyperlipidemia   . Hypertension   . Hypothyroidism   . Obesity   . PONV (postoperative nausea and vomiting)   . Tachycardia     SURGICAL HISTORY: Past Surgical History:  Procedure Laterality Date  . FLEXIBLE SIGMOIDOSCOPY N/A 05/31/2017   Procedure: FLEXIBLE SIGMOIDOSCOPY;  Surgeon: Clarene Essex, MD;  Location: [facility] ENDOSCOPY;  Service: Endoscopy;  Laterality: N/A;  . WISDOM TOOTH EXTRACTION     age 35's    I have reviewed the social history and family history with the patient and they are unchanged from previous note.  ALLERGIES:  is allergic to bupropion and amoxicillin.  MEDICATIONS:  Current Outpatient Medications  Medication Sig Dispense Refill  . ALPRAZolam (XANAX) 1 MG tablet Take 1 tablet (1 mg total) by mouth daily as needed for anxiety. 20 tablet 0  . atorvastatin (LIPITOR) 10 MG tablet Take 1 tablet (10 mg total) by mouth daily. 90 tablet 3  . CINNAMON PO Take 1 tablet by mouth daily.     . fluticasone (FLONASE) 50 MCG/ACT nasal spray Place 1 spray into both nostrils daily. (Patient taking differently: Place 1 spray into both nostrils daily as needed  for allergies. ) 16 g 2  . insulin NPH-regular Human (NOVOLIN 70/30) (70-30) 100 UNIT/ML injection Inject 25 Units into the skin 2 (two) times daily with a meal.     . levothyroxine (SYNTHROID, LEVOTHROID) 112 MCG tablet Take 112 mcg by mouth daily before breakfast.    . Omega-3 Fatty Acids (FISH OIL) 1000 MG CAPS Take 1,000 mg by mouth daily.     . polyethylene glycol (MIRALAX / GLYCOLAX) packet Take 17 g by mouth daily. 14 each 0  . propranolol (INDERAL) 10 MG tablet Take 1 tablet (10 mg total) by mouth daily. 90 tablet 0  . TURMERIC PO Take 1 capsule by mouth daily.      No current facility-administered medications for this visit.     PHYSICAL EXAMINATION: ECOG PERFORMANCE STATUS: 1 - Symptomatic but completely ambulatory  Vitals:   07/07/17 0936  BP: (!) 151/94  Pulse: 84  Resp: 18  Temp: 98.2 F (36.8 C)  SpO2: 100%   Filed Weights   07/07/17 0936  Weight: 158 lb 14.4 oz (72.1 kg)    GENERAL:alert, no distress and comfortable SKIN: skin color, texture, turgor are normal, no rashes or significant  lesions (+) facial acne with dry skin  EYES: normal, Conjunctiva are pink and non-injected, sclera clear OROPHARYNX:no exudate, no erythema and lips, buccal mucosa, and tongue normal  LYMPH:  no palpable cervical, supraclavicular, or axillary lymphadenopathy LUNGS: clear to auscultation with normal breathing effort HEART: regular rate & rhythm and no murmurs and (+) trace bilateral lower extremity edema, L>R ABDOMEN:abdomen soft and normal bowel sounds (+) mild tenderness to LLQ and RUQ Musculoskeletal:no cyanosis of digits and no clubbing  NEURO: alert & oriented x 3 with fluent speech, no focal motor/sensory deficits  LABORATORY DATA:  I have reviewed the data as listed CBC Latest Ref Rng & Units 07/07/2017 06/11/2017 06/03/2017  WBC 3.9 - 10.3 K/uL 9.9 10.2 10.4  Hemoglobin 12.0 - 15.0 g/dL - 8.8(L) 8.7(L)  Hematocrit 34.8 - 46.6 % 32.6(L) 29.5(L) 28.8(L)  Platelets 145 - 400  K/uL 257 305 323     CMP Latest Ref Rng & Units 07/07/2017 06/11/2017 06/03/2017  Glucose 70 - 140 mg/dL 155(H) 178(H) 113(H)  BUN 7 - 26 mg/dL _0 Creatinine 0.60 - 1.10 mg/dL 0.79 0.86 0.71  Sodium 136 - 145 mmol/L 140 137 145(H)  Potassium 3.5 - 5.1 mmol/L 4.2 4.2 4.3  Chloride 98 - 109 mmol/L 105 103 105  CO2 22 - 29 mmol/L _1 Calcium 8.4 - 10.4 mg/dL 8.7 7.9(L) 8.0(L)  Total Protein 6.4 - 8.3 g/dL 7.8 7.0 6.8  Total Bilirubin 0.2 - 1.2 mg/dL 0.3 0.6 0.2  Alkaline Phos 40 - 150 U/L 182(H) 162(H) 163(H)  AST 5 - 34 U/L _2 ALT 0 - 55 U/L 11 12(L) 12    CEA 05/31/17 353.3 (baseline) 06/01/17 350.7 07/07/17: 494.58   PATHOLOGY   Diagnosis 06/11/17  Liver, needle/core biopsy - METASTATIC ADENOCARCINOMA, CONSISTENT WITH COLONIC PRIMARY.  [facility] RESULTS     RADIOGRAPHIC STUDIES: I have personally reviewed the radiological images as listed and agreed with the findings in the report. No results found.   ASSESSMENT & PLAN: 47 y.o. caucasian female, with past medical history of diabetes, hypertension, presented with left lower quadrant abdominal pain, hematochezia, fatigue and weight loss  1. Sigmoid adenocarcinoma, with abdominopelvic adenopathy andhepatic metastasis, stage IV, MSI-stable, KRAS/NRAS wild type  -Ms. Lemon appears stable. I previously reviewed FO results with Dr. Burr Medico and discussed with the patient today; tumor is KRAS/NRAS wild type; she is a candidate for panitumumab in addition to her chemotherapy. I reviewed potential side effects including but not limited to acne-type skin rash, GI toxicity such as diarrhea or nausea, and hypersensitivity reaction; I again reviewed FOLFOX and common side effects as previously discussed. She has hesitations about side effects and the impact her treatment will have on her son, but she ultimately agrees to FOLFOX/panitumumab and PAC placement. She would like to have PAC placed after her son's spring break,  and first chemo after his birthday so she will feel well. We will accommodate.   -She is taking mushroom, essiac, and kombucha supplements, I informed her of potential interaction between certain supplements and medication/chemo. Will review with pharmacy; I recommend she hold supplements while on chemotherapy. I encouraged her to continue energy therapy as she feels it has helped her mental and overall state. -She hopes to meet with SW and financial advocate this week to discuss the financial and emotional burden of her diagnosis, I have sent in-basket messages requesting their f/u.  -All labs reviewed; CEA elevated to 494, previously 350 1  month ago. Given it will be 2 months since initial imaging by the time chemo starts, will obtain CT CAP few days before treatment to establish new baseline.  -f/u with Dr. Burr Medico in 3 weeks with cycle 1 and to review CT.  2. Bilateral LE Edema with calf tenderness  -She developed intermittent lower extremity calf tenderness R>L at night, at rest. On exam left extremity is slightly larger. Doppler today was negative for bilateral DVT on 07/07/17.  -I recommend she elevate her legs when resting, wear compression stockings PRN, remain active and hydrate well.   3. Depression -She reports history of depression and anxiety, currently on xanax PRN. Did not tolerate bupropion historically. She is tearful today, requesting to meet with SW to discuss her depression. Declined restarting depression medication today, prefers to talk first.  -I requested SW contact the patient this week to f/u and review resources she can use with her young son to help better cope with her situation.    4. Anemia, iron deficiency -Hgb and iron studies improved on oral iron 1 tab daily, continue   5. DM, HTN, HL -BP elevated lately, 151/94. On inderal. I recommend she increase activity and eat well.   6. Weight loss  -Weight is stable; she changed her diet to include more vegetables, cut out  added sugar. I encouraged her to continue.   7. Genetics  -Apt pending 08/10/17  8. Social issues  -followed by SW and Clinical biochemist, I requested f/u from support services today for her emotional needs and resources. She is very sad and anxious about how her treatment will impact her son.  9. Goals of care  -Reviewed the liver biopsy confirmed metastatic colon cancer and is likely not curable, but treatable. She understands the goal of therapy is disease control.    PLAN: -US doppler today -PAC placement ~4/29 -CT CAP ~5/3 prior to cycle 1 -Lab, flush, f/u with Dr. Burr Medico, cycle 1 FOLFOX/panitumumab ~5/7 -Lab, F/u Kirk Basquez 1 week after cycle 1 to monitor side effects  -Look into supplements with pharmacy -Continue oral iron supplement 1 tab daily  -messages to SW, financial requesting f/u this week    All questions were answered. The patient knows to call the clinic with any problems, questions or concerns. No barriers to learning was detected. I spent 30 minutes counseling the patient face to face. The total time spent in the appointment was 40 minutes and more than 50% was on counseling, review of test results, and coordination of care.      Alla Feeling, NP 07/07/17

## 2017-07-07 ENCOUNTER — Telehealth: Payer: Self-pay

## 2017-07-07 ENCOUNTER — Inpatient Hospital Stay: Payer: BLUE CROSS/BLUE SHIELD

## 2017-07-07 ENCOUNTER — Other Ambulatory Visit: Payer: Self-pay

## 2017-07-07 ENCOUNTER — Encounter: Payer: Self-pay | Admitting: Nurse Practitioner

## 2017-07-07 ENCOUNTER — Other Ambulatory Visit: Payer: Self-pay | Admitting: Hematology

## 2017-07-07 ENCOUNTER — Ambulatory Visit (HOSPITAL_COMMUNITY)
Admission: RE | Admit: 2017-07-07 | Discharge: 2017-07-07 | Disposition: A | Discharge status: Home / Self Care | Payer: BLUE CROSS/BLUE SHIELD | Source: Ambulatory Visit | Attending: Nurse Practitioner | Admitting: Nurse Practitioner

## 2017-07-07 ENCOUNTER — Encounter: Payer: Self-pay | Admitting: General Practice

## 2017-07-07 ENCOUNTER — Inpatient Hospital Stay (HOSPITAL_BASED_OUTPATIENT_CLINIC_OR_DEPARTMENT_OTHER): Payer: BLUE CROSS/BLUE SHIELD | Admitting: Nurse Practitioner

## 2017-07-07 VITALS — BP 151/94 | HR 84 | Temp 98.2°F | Resp 18 | Ht 65.0 in | Wt 158.9 lb

## 2017-07-07 DIAGNOSIS — D500 Iron deficiency anemia secondary to blood loss (chronic): Secondary | ICD-10-CM

## 2017-07-07 DIAGNOSIS — F329 Major depressive disorder, single episode, unspecified: Secondary | ICD-10-CM

## 2017-07-07 DIAGNOSIS — C187 Malignant neoplasm of sigmoid colon: Secondary | ICD-10-CM

## 2017-07-07 DIAGNOSIS — D490 Neoplasm of unspecified behavior of digestive system: Secondary | ICD-10-CM

## 2017-07-07 DIAGNOSIS — R600 Localized edema: Secondary | ICD-10-CM

## 2017-07-07 DIAGNOSIS — E119 Type 2 diabetes mellitus without complications: Secondary | ICD-10-CM | POA: Diagnosis not present

## 2017-07-07 DIAGNOSIS — C189 Malignant neoplasm of colon, unspecified: Secondary | ICD-10-CM

## 2017-07-07 DIAGNOSIS — E039 Hypothyroidism, unspecified: Secondary | ICD-10-CM

## 2017-07-07 DIAGNOSIS — C787 Secondary malignant neoplasm of liver and intrahepatic bile duct: Secondary | ICD-10-CM | POA: Diagnosis not present

## 2017-07-07 DIAGNOSIS — C184 Malignant neoplasm of transverse colon: Secondary | ICD-10-CM | POA: Diagnosis not present

## 2017-07-07 DIAGNOSIS — R4589 Other symptoms and signs involving emotional state: Secondary | ICD-10-CM

## 2017-07-07 LAB — CMP (CANCER CENTER ONLY)
ALT: 11 U/L (ref 0–55)
AST: 20 U/L (ref 5–34)
Albumin: 3.5 g/dL (ref 3.5–5.0)
Alkaline Phosphatase: 182 U/L — ABNORMAL HIGH (ref 40–150)
Anion gap: 9 (ref 3–11)
BUN: 10 mg/dL (ref 7–26)
CO2: 26 mmol/L (ref 22–29)
Calcium: 8.7 mg/dL (ref 8.4–10.4)
Chloride: 105 mmol/L (ref 98–109)
Creatinine: 0.79 mg/dL (ref 0.60–1.10)
GFR, Est AFR Am: >60 mL/min (ref >60)
GFR, Estimated: >60 mL/min (ref >60)
Glucose, Bld: 155 mg/dL — ABNORMAL HIGH (ref 70–140)
Potassium: 4.2 mmol/L (ref 3.5–5.1)
Sodium: 140 mmol/L (ref 136–145)
Total Bilirubin: 0.3 mg/dL (ref 0.2–1.2)
Total Protein: 7.8 g/dL (ref 6.4–8.3)

## 2017-07-07 LAB — CEA (IN HOUSE-CHCC): CEA (CHCC-In House): 494.58 ng/mL — ABNORMAL HIGH (ref 0.00–5.00)

## 2017-07-07 LAB — CBC WITH DIFFERENTIAL (CANCER CENTER ONLY)
Basophils Absolute: 0.1 10*3/uL (ref 0.0–0.1)
Basophils Relative: 1 %
Eosinophils Absolute: 0.8 10*3/uL — ABNORMAL HIGH (ref 0.0–0.5)
Eosinophils Relative: 9 %
HCT: 32.6 % — ABNORMAL LOW (ref 34.8–46.6)
Hemoglobin: 10.2 g/dL — ABNORMAL LOW (ref 11.6–15.9)
Lymphocytes Relative: 20 %
Lymphs Abs: 1.9 10*3/uL (ref 0.9–3.3)
MCH: 23 pg — ABNORMAL LOW (ref 25.1–34.0)
MCHC: 31.4 g/dL — ABNORMAL LOW (ref 31.5–36.0)
MCV: 73.3 fL — ABNORMAL LOW (ref 79.5–101.0)
Monocytes Absolute: 0.5 10*3/uL (ref 0.1–0.9)
Monocytes Relative: 5 %
Neutro Abs: 6.5 10*3/uL (ref 1.5–6.5)
Neutrophils Relative %: 65 %
Platelet Count: 257 10*3/uL (ref 145–400)
RBC: 4.44 MIL/uL (ref 3.70–5.45)
RDW: 21.7 % — ABNORMAL HIGH (ref 11.2–14.5)
WBC Count: 9.9 10*3/uL (ref 3.9–10.3)

## 2017-07-07 LAB — IRON AND TIBC
Iron: 177 ug/dL — ABNORMAL HIGH (ref 41–142)
Saturation Ratios: 44 % (ref 21–57)
TIBC: 399 ug/dL (ref 236–444)
UIBC: 222 ug/dL

## 2017-07-07 LAB — FERRITIN: Ferritin: 27 ng/mL (ref 9–269)

## 2017-07-07 NOTE — Telephone Encounter (Signed)
Called SW while  patient went to labs. Webb Silversmith called back but patient left, and was do have SW contact information. SW will reach out to her. Per 4/16 los

## 2017-07-07 NOTE — Progress Notes (Signed)
[place] Spiritual Care Note  Reached Kim by phone per referral from [facility]/NP. Provided empathic listening and emotional support. We plan to meet in my office at 1pm on Thursday 4/18 to continue conversation and to explore support resources available through [facility].   [place], [place], [facility] Pager [phone] Voicemail [phone]

## 2017-07-07 NOTE — Progress Notes (Signed)
Bilateral lower extremity venous duplex has been completed. Negative for DVT. Results were given to Cira Rue NP.  07/07/17 1:07 PM Carlos Levering RVT

## 2017-07-07 NOTE — Progress Notes (Signed)
START ON PATHWAY REGIMEN - Colorectal     A cycle is every 14 days:     Panitumumab      Oxaliplatin      Leucovorin      5-Fluorouracil      5-Fluorouracil   **Always confirm dose/schedule in your pharmacy ordering system**    Patient Characteristics: Metastatic Colorectal, First Line, Nonsurgical Candidate, KRAS/NRAS Wild-Type, BRAF Wild-Type/Unknown, PS = 0,1; Bevacizumab Eligible Current evidence of distant metastases<= Yes AJCC T Category: TX AJCC N Category: N1b AJCC M Category: M1a AJCC 8 Stage Grouping: IVA BRAF Mutation Status: Wild Type (no mutation) KRAS/NRAS Mutation Status: Wild Type (no mutation) Line of therapy: First Environmental consultant Status: PS = 0, 1 Bevacizumab Eligibility: Eligible Intent of Therapy: Non-Curative / Palliative Intent, Discussed with Patient

## 2017-07-09 ENCOUNTER — Encounter: Payer: Self-pay | Admitting: General Practice

## 2017-07-09 NOTE — Progress Notes (Signed)
[place] Spiritual Care Note  Met with Maudie Mercury in my office as scheduled, providing emotional support and space for her to process her questions "wishful thinking" vs "accepting" dx (her words) and her discernment about whether or not to pursue chemotherapy. Encouraged her to bring her questions back to her physician to explore in more detail.  Also reviewed all written support materials and programming resources together in [employer], making her a full packet. Signed out her massage packet (two certificates for pt, two for mom as caregiver) and encouraged Living With Cancer support group (for pts with incurable cancer) and [employer] healing arts classes. She took information on [employer], hospice resources near her son's dad's home, and booklets on several topics (caregiver distress, talking with children, how to talk to someone who has cancer, etc).  Maudie Mercury is aware of ongoing [place] team and programming availability. We plan for me to f/u by phone next week, but please also page if needs arise or circumstances change. Thank you.   [place], [place], [facility] Pager [phone] Voicemail [phone]

## 2017-07-14 ENCOUNTER — Telehealth: Payer: Self-pay

## 2017-07-14 NOTE — Telephone Encounter (Signed)
Spoke with [facility] @ [employer]. Appt made for CAP with contrast on 5/2 @ 10:00am @ [facility]. Will notify pt.

## 2017-07-15 ENCOUNTER — Encounter: Payer: Self-pay | Admitting: Genetics

## 2017-07-15 ENCOUNTER — Other Ambulatory Visit: Payer: Self-pay

## 2017-07-16 ENCOUNTER — Telehealth: Payer: Self-pay

## 2017-07-16 NOTE — Telephone Encounter (Signed)
Left VM msg on mother's VM to return call, Advised that it was urgent. Trying to give pt CT scan date, time and instructions

## 2017-07-16 NOTE — Progress Notes (Signed)
Attempted to call patient to prompt her to call central scheduling to schedule her port placement. Central scheduling has attempted to call her to schedule with no success. I was unable to leave a voicemail because VM mailbox is full. I will continue to try and reach patient.

## 2017-07-23 ENCOUNTER — Ambulatory Visit (HOSPITAL_COMMUNITY)
Admission: RE | Admit: 2017-07-23 | Discharge: 2017-07-23 | Disposition: A | Discharge status: Home / Self Care | Payer: BLUE CROSS/BLUE SHIELD | Source: Ambulatory Visit | Attending: Nurse Practitioner | Admitting: Nurse Practitioner

## 2017-07-23 ENCOUNTER — Telehealth: Payer: Self-pay | Admitting: Nurse Practitioner

## 2017-07-23 DIAGNOSIS — C787 Secondary malignant neoplasm of liver and intrahepatic bile duct: Secondary | ICD-10-CM | POA: Insufficient documentation

## 2017-07-23 DIAGNOSIS — K5900 Constipation, unspecified: Secondary | ICD-10-CM | POA: Insufficient documentation

## 2017-07-23 DIAGNOSIS — D490 Neoplasm of unspecified behavior of digestive system: Secondary | ICD-10-CM | POA: Diagnosis present

## 2017-07-23 DIAGNOSIS — I77819 Aortic ectasia, unspecified site: Secondary | ICD-10-CM | POA: Diagnosis not present

## 2017-07-23 DIAGNOSIS — I700 Atherosclerosis of aorta: Secondary | ICD-10-CM | POA: Insufficient documentation

## 2017-07-23 DIAGNOSIS — C189 Malignant neoplasm of colon, unspecified: Secondary | ICD-10-CM | POA: Diagnosis not present

## 2017-07-23 DIAGNOSIS — I2510 Atherosclerotic heart disease of native coronary artery without angina pectoris: Secondary | ICD-10-CM | POA: Diagnosis not present

## 2017-07-23 MED ORDER — IOHEXOL 300 MG/ML  SOLN
100.0000 mL | Freq: Once | INTRAMUSCULAR | Status: AC | PRN
  Administered 2017-07-23: 100 mL via INTRAVENOUS

## 2017-07-23 NOTE — Telephone Encounter (Signed)
I attempted to reach patient on her listed phone number and mother's alternative number. Mother's voicemail not set up to receive messages. I left message on the patient's Vmail to call back. I intend to discuss restaging CT results, encourage her to schedule PAC placement with IR, and discuss her upcoming chemotherapy appointment on 07/28/17.

## 2017-07-24 ENCOUNTER — Telehealth: Payer: Self-pay | Admitting: Nurse Practitioner

## 2017-07-24 NOTE — Telephone Encounter (Signed)
I returned patient's call to discuss CT results and her plan of care. I reviewed there is slight progression in the liver, colon mass and abdominal adenopathy are stable. No new metastasis in the lungs. She has not scheduled PAC yet, so will need to r/s chemo on 5/7. I asked if she still agrees to [facility] placement and chemo, now she is not sure. She wants to come in to talk more. I sent schedule message to add f/u with me 07/29/17 at 10:30. Patient verbalizes understands and appreciates the call.

## 2017-07-24 NOTE — Telephone Encounter (Signed)
Called patient regarding 5/8

## 2017-07-28 ENCOUNTER — Inpatient Hospital Stay: Payer: BLUE CROSS/BLUE SHIELD | Admitting: Nurse Practitioner

## 2017-07-28 ENCOUNTER — Inpatient Hospital Stay: Payer: BLUE CROSS/BLUE SHIELD

## 2017-07-28 ENCOUNTER — Ambulatory Visit: Payer: Self-pay | Admitting: Nurse Practitioner

## 2017-07-28 NOTE — Progress Notes (Addendum)
[facility]  [phone] [phone]  Clinic Follow up Note   Patient Care Team: Beverly Schwartz, Beverly Schwartz, Beverly Schwartz as PCP - General (Family Medicine) Kerr, Jeffrey, MD as Consulting Physician (Endocrinology) Feng, Yan, MD as Consulting Physician (Hematology) Burton, Lacie K, Beverly Schwartz as Nurse Practitioner (Nurse Practitioner) Magod, Marc, MD as Consulting Physician (Gastroenterology) 07/29/2017  SUMMARY OF ONCOLOGIC HISTORY: Oncology History   Cancer Staging Malignant neoplasm of rectosigmoid junction (HCC) Staging form: Colon and Rectum, AJCC 8th Edition - Clinical stage from 06/11/2017: Stage IVA (cTX, cN1b, pM1a) - Signed by Feng, Yan, MD on 06/15/2017       Malignant neoplasm of rectosigmoid junction (HCC)   05/30/2017 Imaging    CT AP IMPRESSION: 1. 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 Abdominopelvic adenopathy. Rectosigmoid mass with suggestion of partial obstruction as evidenced by large colonic stool burden more proximally. 2.  Aortic Atherosclerosis (ICD10-I70.0).  This is age advanced.      05/31/2017 Tumor Marker    CEA 353.3 (elevated) AFP 4.5 (normal)      05/31/2017 Initial Biopsy    Diagnosis Colon, biopsy, sigmoid - INVASIVE ADENOCARCINOMA - SEE COMMENT      05/31/2017 Imaging    CT CHEST IMPRESSION: 1. No evidence of metastatic disease in the chest. 2. No acute findings.  Aortic Atherosclerosis (ICD10-I70.0).       05/31/2017 Procedure    Colonoscopy per Dr. Magod Findings: An infiltrative and ulcerated partially obstructing medium-sized mass was found in the recto-sigmoid colon. The mass was circumferential. The mass measured four cm in length. No bleeding was present.   Impression - One small polyp in the rectum. - The examination was otherwise normal. - Malignant partially obstructing tumor in the recto-sigmoid colon. Biopsied. - One medium polyp in the proximal  sigmoid colon. - The examination was otherwise normal. - Internal hemorrhoids.      06/03/2017 Initial Diagnosis    Malignant neoplasm of transverse colon (HCC)      06/11/2017 Cancer Staging    Staging form: Colon and Rectum, AJCC 8th Edition - Clinical stage from 06/11/2017: Stage IVA (cTX, cN1b, pM1a) - Signed by Feng, Yan, MD on 06/15/2017      06/11/2017 Pathology Results    Liver biopsy confirmed metastatic colon cancer      07/23/2017 Imaging    CT CAP IMPRESSION: 1. Mild progression of hepatic metastasis. 2. Similar rectosigmoid primary with abdominopelvic nodal metastasis. 3.  No acute process or evidence of metastatic disease in the chest. 4. Aortic Atherosclerosis (ICD10-I70.0). Coronary artery atherosclerosis. This is age advanced. 5. Proximal colonic constipation, again suggesting a component of partial obstruction at the primary site. 6. Mild ascending aortic dilatation at 4.1 cm.      08/04/2017 -  Chemotherapy    First line FOLFIRI and Panitumumab every 2 weeks      CURRENT THERAPY: PENDING 1st line FOLFOX/panitumumab q2 weeks   INTERVAL HISTORY: Beverly Schwartz returns for further discussion of her cancer and treatment plan. She has been feeling well in the interim. She has fatigue and decreased appetite and slight weight loss. Had 1 episode of loose stool this week but otherwise stool is formed, with occasional blood. Takes Miralax PRN. No nausea or vomiting. Takes iron pill few times per week. Continues to report LLQ pain, intermittent and transient. Takes OTC meds PRN but does not require stronger pain medication. Has dry cough, no chest pain or dyspnea.   REVIEW OF SYSTEMS:   Constitutional:   Denies fevers, chills (+) weight loss (+) decreased appetite  (+) fatigue Eyes: Denies blurriness of vision Ears, nose, mouth, throat, and face: Denies mucositis or sore throat Respiratory: Denies dyspnea or wheezes (+) dry cough Cardiovascular: Denies palpitation, chest  discomfort or lower extremity swelling Gastrointestinal:  Denies nausea, vomiting, heartburn (+) intermittent mild LLQ pain (+) loose BM x1 otherwise formed with occasional blood  Skin: Denies abnormal skin rashes Lymphatics: Denies new lymphadenopathy or easy bruising Neurological:Denies numbness, tingling or new weaknesses (+) 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  MEDICAL HISTORY:  Past Medical History:  Diagnosis Date  . Anxiety   . Cancer ([place])    colon, liver  . Chest pain   . Colon cancer ([place])   . Complication of anesthesia   . Depression   . Diabetes mellitus   . Dizziness   . Hyperlipidemia   . Hypertension   . Hypothyroidism   . Obesity   . PONV (postoperative nausea and vomiting)   . Tachycardia     SURGICAL HISTORY: Past Surgical History:  Procedure Laterality Date  . FLEXIBLE SIGMOIDOSCOPY N/A 05/31/2017   Procedure: FLEXIBLE SIGMOIDOSCOPY;  Surgeon: Clarene Essex, MD;  Location: [facility];  Service: Endoscopy;  Laterality: N/A;  . WISDOM TOOTH EXTRACTION     age 34's    I have reviewed the social history and family history with the patient and they are unchanged from previous note.  ALLERGIES:  is allergic to bupropion and amoxicillin.  MEDICATIONS:  Current Outpatient Medications  Medication Sig Dispense Refill  . ALPRAZolam (XANAX) 1 MG tablet Take 1 tablet (1 mg total) by mouth daily as needed for anxiety. 20 tablet 0  . atorvastatin (LIPITOR) 10 MG tablet Take 1 tablet (10 mg total) by mouth daily. 90 tablet 3  . CINNAMON PO Take 1 tablet by mouth daily.     . fluticasone (FLONASE) 50 MCG/ACT nasal spray Place 1 spray into both nostrils daily. (Patient taking differently: Place 1 spray into both nostrils daily as needed for allergies. ) 16 g 2  . insulin NPH-regular Human (NOVOLIN 70/30) (70-30) 100 UNIT/ML injection Inject 25 Units into the  skin 2 (two) times daily with a meal.     . levothyroxine (SYNTHROID, LEVOTHROID) 112 MCG tablet Take 112 mcg by mouth daily before breakfast.    . Omega-3 Fatty Acids (FISH OIL) 1000 MG CAPS Take 1,000 mg by mouth daily.     . polyethylene glycol (MIRALAX / GLYCOLAX) packet Take 17 g by mouth daily. 14 each 0  . propranolol (INDERAL) 10 MG tablet Take 1 tablet (10 mg total) by mouth daily. 90 tablet 0  . TURMERIC PO Take 1 capsule by mouth daily.      No current facility-administered medications for this visit.     PHYSICAL EXAMINATION: ECOG PERFORMANCE STATUS: 1 - Symptomatic but completely ambulatory  Vitals:   07/29/17 1032  BP: (!) 155/90  Pulse: 94  Resp: 18  Temp: 98.1 F (36.7 C)  SpO2: 100%   Filed Weights   07/29/17 1032  Weight: 154 lb 1.6 oz (69.9 kg)    GENERAL:alert, no distress and comfortable SKIN: skin color, texture, turgor are normal, no rashes or significant lesions EYES: normal, Conjunctiva are pink and non-injected, sclera clear LYMPH:  no palpable cervical, supraclavicular, or axillary lymphadenopathy  LUNGS: clear to auscultation with normal breathing effort HEART: regular rate &  rhythm and no murmurs trace bilateral lower extremity edema ABDOMEN:abdomen soft, non-tender and normal bowel sounds. Hepatomegaly. No palpable mass  Musculoskeletal:no cyanosis of digits and no clubbing  NEURO: alert & oriented x 3 with fluent speech, no focal motor/sensory deficits  LABORATORY DATA:  I have reviewed the data as listed CBC Latest Ref Rng & Units 07/29/2017 07/07/2017 06/11/2017  WBC 3.9 - 10.3 K/uL 10.6(H) 9.9 10.2  Hemoglobin 11.6 - 15.9 g/dL 9.8(L) 10.2(L) 8.8(L)  Hematocrit 34.8 - 46.6 % 31.2(L) 32.6(L) 29.5(L)  Platelets 145 - 400 K/uL 282 257 305     CMP Latest Ref Rng & Units 07/29/2017 07/07/2017 06/11/2017  Glucose 70 - 140 mg/dL 207(H) 155(H) 178(H)  BUN 7 - 26 mg/dL _0 Creatinine 0.60 - 1.10 mg/dL 0.82 0.79 0.86  Sodium 136 - 145 mmol/L 140  140 137  Potassium 3.5 - 5.1 mmol/L 4.0 4.2 4.2  Chloride 98 - 109 mmol/L 105 105 103  CO2 22 - 29 mmol/L _1 Calcium 8.4 - 10.4 mg/dL 8.2(L) 8.7 7.9(L)  Total Protein 6.4 - 8.3 g/dL 7.3 7.8 7.0  Total Bilirubin 0.2 - 1.2 mg/dL 0.3 0.3 0.6  Alkaline Phos 40 - 150 U/L 194(H) 182(H) 162(H)  AST 5 - 34 U/L _2 ALT 0 - 55 U/L 12 11 12(L)   CEA 05/31/17 353.3 (baseline) 06/01/17 350.7 07/07/17: 494.58   PATHOLOGY   Diagnosis 06/11/17       Liver, needle/core biopsy - METASTATIC ADENOCARCINOMA, CONSISTENT WITH COLONIC PRIMARY.  [facility] RESULTS      RADIOGRAPHIC STUDIES: I have personally reviewed the radiological images as listed and agreed with the findings in the report. No results found.   ASSESSMENT & PLAN: 47 y.o.caucasian female, with past medical history of diabetes, hypertension, presented with left lower quadrant abdominal pain, hematochezia, fatigue and weight loss  1. Rectosigmoid adenocarcinoma, with abdominopelvic adenopathy anddiffuse hepatic metastasis, stage IV, MSI-stable, KRAS/NRAS wild type  -Beverly Schwartz appears stable. She has mild fatigue, weight loss, and intermittent LLQ pain but overall she feels okay. She does not require stronger medication for pain, presently controlled with tylenol/NSAIDs. Will monitor closely.  -This was a shared visit with Dr. Burr Medico; we reviewed her restaging CT and Dr. Burr Medico reviewed the images on screen. The locally advanced rectosigmoid mass is stable, no new metastasis. There is mild progression in the liver. CEA is elevated to 494 three weeks ago. -We had a lengthy discussion with the patient and her mother, they had many questions. We discussed the natural course of metastatic colon cancer disease progression, prognosis, quality of life, and many different treatment options and scenarios.  -Given her mild preexisting neuropathy, Dr. Burr Medico recommends to begin with FOLFIRI/panitumumab, to improve her symptoms and  prolong her life. MD discussed the inevitable need to change therapies in the future. She reviewed the future possibility for maintenance therapy with 5FU/xeloda and panitumumab if she has a good response to therapy. She is aware she can stop treatment at any time. -The patient has multiple and large volume liver metastasis, there is not a role for liver resection or local therapy at this point. We discussed indications for colon resection, such as obstruction or severe bleeding; she is not a candidate. The patient understands.  -Beverly Schwartz does not want a PAC although Dr. Burr Medico recommends this device. However, if the patient refuses, Dr. Burr Medico will consider treatment with q2 weeks irinotecan and panitumumab with weekly 5FU bolus via peripheral  vein.  -The patient will consider her options, she has trouble getting through on the phone and has requested I call her on Friday for her decision.  -f/u pending her decision  2. Bilateral LE edema with calf tenderness - negative DVT on 07/07/17  3. Depression  -Mood is stable. She enjoyed her son's 12th birthday yesterday.   4. Anemia, iron deficiency  -Hgb slightly decreased, 9.8; she has intermittent hematochezia. She takes iron few times per week. I recommend she increase to 1 tab daily and switch to ferrous sulfate, which contains higher iron content. She agrees. Will check iron at next visit.   5. DM, HTN, HL -BP remains elevated on inderal daily. I recommend she call PCP for adjustments.  -She self-discontinued statin. Her mother knows someone who had muscle pain on this medication. I encouraged the patient to stay on statin therapy if she tolerates it given her CT evidence of age advanced aortic atherosclerosis. If she does not tolerate statin, there are other options. I encouraged her to f/u with PCP.  6. Weight loss -Secondary to #1. Will monitor closely and refer to dietician as needed.   7. Genetics -appointment 08/10/17  8. Social issues    -Followed by social work, chaplain, GI nurse navigator for ongoing support.  9. Goals of care  -We again discussed the extensive nature of her cancer and that it is unlikely curable at this point, but treatable. The goal is to control disease, improve symptoms, and prolong her life. She is a full code.   PLAN -Labs reviewed -Iron studies at next visit, increase oral iron to 1 tab daily and change to ferrous sulfate -Patient to consider chemotherapy options and PAC, Beverly Schwartz will call her end of this week for her decision; if patient agrees, plan to proceed with FOLFIRI/panitumumab.  -F/u pending patient's decision   All questions were answered. The patient knows to call the clinic with any problems, questions or concerns. No barriers to learning was detected. I spent 25 minutes counseling the patient face to face. The total time spent in the appointment was 30 minutes and more than 50% was on counseling and review of test results      Yan Feng, MD 07/29/17   Addendum  I have seen the patient, examined her. I agree with the assessment and and plan and have edited the notes.   Ms Schwartz returns for follow-up.  She has not decided to take chemo, although she states that she would likely to do it.  I had a lengthy discussion with the patient and her mother, about the natural history of metastatic colon cancer, with median life expectancy 10 to 12 months without treatment.  I reviewed his [facility] genomic testing results, due to the K-ras and NRAS/BRAFwild type, she is a good candidate for EGFR inhibitor.  With first-line chemotherapy such as a FOFIRI and panitumumab, if she responds well, her life expectancy is likely 2-3 years.  Given her diffuse liver metastasis, she is unlikely curable.  I reviewed her CT scan images in person with her and her mother again today.  I strongly encouraged her to consider chemotherapy.  She states she will make a decision in the next few days and call us back.    Yan Feng  07/29/2017  

## 2017-07-29 ENCOUNTER — Encounter: Payer: Self-pay | Admitting: Nurse Practitioner

## 2017-07-29 ENCOUNTER — Inpatient Hospital Stay: Payer: BLUE CROSS/BLUE SHIELD | Attending: Hematology | Admitting: Nurse Practitioner

## 2017-07-29 ENCOUNTER — Encounter: Payer: Self-pay | Admitting: General Practice

## 2017-07-29 ENCOUNTER — Inpatient Hospital Stay: Payer: BLUE CROSS/BLUE SHIELD

## 2017-07-29 ENCOUNTER — Telehealth: Payer: Self-pay | Admitting: Nurse Practitioner

## 2017-07-29 VITALS — BP 155/90 | HR 94 | Temp 98.1°F | Resp 18 | Ht 65.0 in | Wt 154.1 lb

## 2017-07-29 DIAGNOSIS — C184 Malignant neoplasm of transverse colon: Secondary | ICD-10-CM | POA: Diagnosis not present

## 2017-07-29 DIAGNOSIS — D500 Iron deficiency anemia secondary to blood loss (chronic): Secondary | ICD-10-CM

## 2017-07-29 DIAGNOSIS — R634 Abnormal weight loss: Secondary | ICD-10-CM | POA: Diagnosis not present

## 2017-07-29 DIAGNOSIS — C787 Secondary malignant neoplasm of liver and intrahepatic bile duct: Secondary | ICD-10-CM

## 2017-07-29 DIAGNOSIS — C187 Malignant neoplasm of sigmoid colon: Secondary | ICD-10-CM

## 2017-07-29 DIAGNOSIS — I700 Atherosclerosis of aorta: Secondary | ICD-10-CM | POA: Insufficient documentation

## 2017-07-29 DIAGNOSIS — E119 Type 2 diabetes mellitus without complications: Secondary | ICD-10-CM | POA: Insufficient documentation

## 2017-07-29 DIAGNOSIS — C19 Malignant neoplasm of rectosigmoid junction: Secondary | ICD-10-CM | POA: Insufficient documentation

## 2017-07-29 DIAGNOSIS — D509 Iron deficiency anemia, unspecified: Secondary | ICD-10-CM | POA: Diagnosis not present

## 2017-07-29 DIAGNOSIS — I10 Essential (primary) hypertension: Secondary | ICD-10-CM

## 2017-07-29 DIAGNOSIS — C189 Malignant neoplasm of colon, unspecified: Secondary | ICD-10-CM

## 2017-07-29 DIAGNOSIS — Z7189 Other specified counseling: Secondary | ICD-10-CM

## 2017-07-29 LAB — CMP (CANCER CENTER ONLY)
ALT: 12 U/L (ref 0–55)
AST: 22 U/L (ref 5–34)
Albumin: 3.4 g/dL — ABNORMAL LOW (ref 3.5–5.0)
Alkaline Phosphatase: 194 U/L — ABNORMAL HIGH (ref 40–150)
Anion gap: 8 (ref 3–11)
BUN: 10 mg/dL (ref 7–26)
CO2: 27 mmol/L (ref 22–29)
Calcium: 8.2 mg/dL — ABNORMAL LOW (ref 8.4–10.4)
Chloride: 105 mmol/L (ref 98–109)
Creatinine: 0.82 mg/dL (ref 0.60–1.10)
GFR, Est AFR Am: >60 mL/min (ref >60)
GFR, Estimated: >60 mL/min (ref >60)
Glucose, Bld: 207 mg/dL — ABNORMAL HIGH (ref 70–140)
Potassium: 4 mmol/L (ref 3.5–5.1)
Sodium: 140 mmol/L (ref 136–145)
Total Bilirubin: 0.3 mg/dL (ref 0.2–1.2)
Total Protein: 7.3 g/dL (ref 6.4–8.3)

## 2017-07-29 LAB — CBC WITH DIFFERENTIAL (CANCER CENTER ONLY)
Basophils Absolute: 0.1 10*3/uL (ref 0.0–0.1)
Basophils Relative: 1 %
Eosinophils Absolute: 0.9 10*3/uL — ABNORMAL HIGH (ref 0.0–0.5)
Eosinophils Relative: 9 %
HCT: 31.2 % — ABNORMAL LOW (ref 34.8–46.6)
Hemoglobin: 9.8 g/dL — ABNORMAL LOW (ref 11.6–15.9)
Lymphocytes Relative: 18 %
Lymphs Abs: 1.9 10*3/uL (ref 0.9–3.3)
MCH: 22.7 pg — ABNORMAL LOW (ref 25.1–34.0)
MCHC: 31.5 g/dL (ref 31.5–36.0)
MCV: 72.2 fL — ABNORMAL LOW (ref 79.5–101.0)
Monocytes Absolute: 0.4 10*3/uL (ref 0.1–0.9)
Monocytes Relative: 4 %
Neutro Abs: 7.3 10*3/uL — ABNORMAL HIGH (ref 1.5–6.5)
Neutrophils Relative %: 68 %
Platelet Count: 282 10*3/uL (ref 145–400)
RBC: 4.32 MIL/uL (ref 3.70–5.45)
RDW: 20 % — ABNORMAL HIGH (ref 11.2–14.5)
WBC Count: 10.6 10*3/uL — ABNORMAL HIGH (ref 3.9–10.3)

## 2017-07-29 NOTE — Telephone Encounter (Signed)
No LOS 5/8 °

## 2017-07-29 NOTE — Patient Instructions (Signed)
Fluorouracil, 5-FU injection What is this medicine? FLUOROURACIL, 5-FU (flure oh YOOR a sil) is a chemotherapy drug. It slows the growth of cancer cells. This medicine is used to treat many types of cancer like breast cancer, colon or rectal cancer, pancreatic cancer, and stomach cancer. This medicine may be used for other purposes; ask your health care provider or pharmacist if you have questions. COMMON BRAND NAME(S): Adrucil What should I tell my health care provider before I take this medicine? They need to know if you have any of these conditions: -blood disorders -dihydropyrimidine dehydrogenase (DPD) deficiency -infection (especially a virus infection such as chickenpox, cold sores, or herpes) -kidney disease -liver disease -malnourished, poor nutrition -recent or ongoing radiation therapy -an unusual or allergic reaction to fluorouracil, other chemotherapy, other medicines, foods, dyes, or preservatives -pregnant or trying to get pregnant -breast-feeding How should I use this medicine? This drug is given as an infusion or injection into a vein. It is administered in a hospital or clinic by a specially trained health care professional. Talk to your pediatrician regarding the use of this medicine in children. Special care may be needed. Overdosage: If you think you have taken too much of this medicine contact a poison control center or emergency room at once. NOTE: This medicine is only for you. Do not share this medicine with others. What if I miss a dose? It is important not to miss your dose. Call your doctor or health care professional if you are unable to keep an appointment. What may interact with this medicine? -allopurinol -cimetidine -dapsone -digoxin -hydroxyurea -leucovorin -levamisole -medicines for seizures like ethotoin, fosphenytoin, phenytoin -medicines to increase blood counts like filgrastim, pegfilgrastim, sargramostim -medicines that treat or prevent blood  clots like warfarin, enoxaparin, and dalteparin -methotrexate -metronidazole -pyrimethamine -some other chemotherapy drugs like busulfan, cisplatin, estramustine, vinblastine -trimethoprim -trimetrexate -vaccines Talk to your doctor or health care professional before taking any of these medicines: -acetaminophen -aspirin -ibuprofen -ketoprofen -naproxen This list may not describe all possible interactions. Give your health care provider a list of all the medicines, herbs, non-prescription drugs, or dietary supplements you use. Also tell them if you smoke, drink alcohol, or use illegal drugs. Some items may interact with your medicine. What should I watch for while using this medicine? Visit your doctor for checks on your progress. This drug may make you feel generally unwell. This is not uncommon, as chemotherapy can affect healthy cells as well as cancer cells. Report any side effects. Continue your course of treatment even though you feel ill unless your doctor tells you to stop. In some cases, you may be given additional medicines to help with side effects. Follow all directions for their use. Call your doctor or health care professional for advice if you get a fever, chills or sore throat, or other symptoms of a cold or flu. Do not treat yourself. This drug decreases your body's ability to fight infections. Try to avoid being around people who are sick. This medicine may increase your risk to bruise or bleed. Call your doctor or health care professional if you notice any unusual bleeding. Be careful brushing and flossing your teeth or using a toothpick because you may get an infection or bleed more easily. If you have any dental work done, tell your dentist you are receiving this medicine. Avoid taking products that contain aspirin, acetaminophen, ibuprofen, naproxen, or ketoprofen unless instructed by your doctor. These medicines may hide a fever. Do not become pregnant while taking this  medicine. Women should inform their doctor if they wish to become pregnant or think they might be pregnant. There is a potential for serious side effects to an unborn child. Talk to your health care professional or pharmacist for more information. Do not breast-feed an infant while taking this medicine. Men should inform their doctor if they wish to father a child. This medicine may lower sperm counts. Do not treat diarrhea with over the counter products. Contact your doctor if you have diarrhea that lasts more than 2 days or if it is severe and watery. This medicine can make you more sensitive to the sun. Keep out of the sun. If you cannot avoid being in the sun, wear protective clothing and use sunscreen. Do not use sun lamps or tanning beds/booths. What side effects may I notice from receiving this medicine? Side effects that you should report to your doctor or health care professional as soon as possible: -allergic reactions like skin rash, itching or hives, swelling of the face, lips, or tongue -low blood counts - this medicine may decrease the number of white blood cells, red blood cells and platelets. You may be at increased risk for infections and bleeding. -signs of infection - fever or chills, cough, sore throat, pain or difficulty passing urine -signs of decreased platelets or bleeding - bruising, pinpoint red spots on the skin, black, tarry stools, blood in the urine -signs of decreased red blood cells - unusually weak or tired, fainting spells, lightheadedness -breathing problems -changes in vision -chest pain -mouth sores -nausea and vomiting -pain, swelling, redness at site where injected -pain, tingling, numbness in the hands or feet -redness, swelling, or sores on hands or feet -stomach pain -unusual bleeding Side effects that usually do not require medical attention (report to your doctor or health care professional if they continue or are bothersome): -changes in finger or  toe nails -diarrhea -dry or itchy skin -hair loss -headache -loss of appetite -sensitivity of eyes to the light -stomach upset -unusually teary eyes This list may not describe all possible side effects. Call your doctor for medical advice about side effects. You may report side effects to FDA at 1-800-FDA-1088. Where should I keep my medicine? This drug is given in a hospital or clinic and will not be stored at home. NOTE: This sheet is a summary. It may not cover all possible information. If you have questions about this medicine, talk to your doctor, pharmacist, or health care provider.  2018 Elsevier/Gold Standard (2007-07-14 13:53:16)   Irinotecan injection What is this medicine? IRINOTECAN (ir in oh TEE kan ) is a chemotherapy drug. It is used to treat colon and rectal cancer. This medicine may be used for other purposes; ask your health care provider or pharmacist if you have questions. COMMON BRAND NAME(S): Camptosar What should I tell my health care provider before I take this medicine? They need to know if you have any of these conditions: -blood disorders -dehydration -diarrhea -infection (especially a virus infection such as chickenpox, cold sores, or herpes) -liver disease -low blood counts, like low white cell, platelet, or red cell counts -recent or ongoing radiation therapy -an unusual or allergic reaction to irinotecan, sorbitol, other chemotherapy, other medicines, foods, dyes, or preservatives -pregnant or trying to get pregnant -breast-feeding How should I use this medicine? This drug is given as an infusion into a vein. It is administered in a hospital or clinic by a specially trained health care professional. Talk to your pediatrician regarding the  use of this medicine in children. Special care may be needed. Overdosage: If you think you have taken too much of this medicine contact a poison control center or emergency room at once. NOTE: This medicine is  only for you. Do not share this medicine with others. What if I miss a dose? It is important not to miss your dose. Call your doctor or health care professional if you are unable to keep an appointment. What may interact with this medicine? Do not take this medicine with any of the following medications: -atazanavir -certain medicines for fungal infections like itraconazole and ketoconazole -St. John's Wort This medicine may also interact with the following medications: -dexamethasone -diuretics -laxatives -medicines for seizures like carbamazepine, mephobarbital, phenobarbital, phenytoin, primidone -medicines to increase blood counts like filgrastim, pegfilgrastim, sargramostim -prochlorperazine -vaccines This list may not describe all possible interactions. Give your health care provider a list of all the medicines, herbs, non-prescription drugs, or dietary supplements you use. Also tell them if you smoke, drink alcohol, or use illegal drugs. Some items may interact with your medicine. What should I watch for while using this medicine? Your condition will be monitored carefully while you are receiving this medicine. You will need important blood work done while you are taking this medicine. This drug may make you feel generally unwell. This is not uncommon, as chemotherapy can affect healthy cells as well as cancer cells. Report any side effects. Continue your course of treatment even though you feel ill unless your doctor tells you to stop. In some cases, you may be given additional medicines to help with side effects. Follow all directions for their use. You may get drowsy or dizzy. Do not drive, use machinery, or do anything that needs mental alertness until you know how this medicine affects you. Do not stand or sit up quickly, especially if you are an older patient. This reduces the risk of dizzy or fainting spells. Call your doctor or health care professional for advice if you get a  fever, chills or sore throat, or other symptoms of a cold or flu. Do not treat yourself. This drug decreases your body's ability to fight infections. Try to avoid being around people who are sick. This medicine may increase your risk to bruise or bleed. Call your doctor or health care professional if you notice any unusual bleeding. Be careful brushing and flossing your teeth or using a toothpick because you may get an infection or bleed more easily. If you have any dental work done, tell your dentist you are receiving this medicine. Avoid taking products that contain aspirin, acetaminophen, ibuprofen, naproxen, or ketoprofen unless instructed by your doctor. These medicines may hide a fever. Do not become pregnant while taking this medicine. Women should inform their doctor if they wish to become pregnant or think they might be pregnant. There is a potential for serious side effects to an unborn child. Talk to your health care professional or pharmacist for more information. Do not breast-feed an infant while taking this medicine. What side effects may I notice from receiving this medicine? Side effects that you should report to your doctor or health care professional as soon as possible: -allergic reactions like skin rash, itching or hives, swelling of the face, lips, or tongue -low blood counts - this medicine may decrease the number of white blood cells, red blood cells and platelets. You may be at increased risk for infections and bleeding. -signs of infection - fever or chills, cough, sore throat,  pain or difficulty passing urine -signs of decreased platelets or bleeding - bruising, pinpoint red spots on the skin, black, tarry stools, blood in the urine -signs of decreased red blood cells - unusually weak or tired, fainting spells, lightheadedness -breathing problems -chest pain -diarrhea -feeling faint or lightheaded, falls -flushing, runny nose, sweating during infusion -mouth sores or  pain -pain, swelling, redness or irritation where injected -pain, swelling, warmth in the leg -pain, tingling, numbness in the hands or feet -problems with balance, talking, walking -stomach cramps, pain -trouble passing urine or change in the amount of urine -vomiting as to be unable to hold down drinks or food -yellowing of the eyes or skin Side effects that usually do not require medical attention (report to your doctor or health care professional if they continue or are bothersome): -constipation -hair loss -headache -loss of appetite -nausea, vomiting -stomach upset This list may not describe all possible side effects. Call your doctor for medical advice about side effects. You may report side effects to FDA at 1-800-FDA-1088. Where should I keep my medicine? This drug is given in a hospital or clinic and will not be stored at home. NOTE: This sheet is a summary. It may not cover all possible information. If you have questions about this medicine, talk to your doctor, pharmacist, or health care provider.  2018 Elsevier/Gold Standard (2012-09-06 16:29:32)   Panitumumab Solution for Injection What is this medicine? PANITUMUMAB (pan i TOOM ue mab) is a monoclonal antibody. It is used to treat colorectal cancer. This medicine may be used for other purposes; ask your health care provider or pharmacist if you have questions. COMMON BRAND NAME(S): Vectibix What should I tell my health care provider before I take this medicine? They need to know if you have any of these conditions: -eye disease, vision problems -low levels of calcium, magnesium, or potassium in the blood -lung or breathing disease, like asthma -skin conditions or sensitivity -an unusual or allergic reaction to panitumumab, other medicines, foods, dyes, or preservatives -pregnant or trying to get pregnant -breast-feeding How should I use this medicine? This drug is given as an infusion into a vein. It is  administered in a hospital or clinic by a specially trained health care professional. Talk to your pediatrician regarding the use of this medicine in children. Special care may be needed. Overdosage: If you think you have taken too much of this medicine contact a poison control center or emergency room at once. NOTE: This medicine is only for you. Do not share this medicine with others. What if I miss a dose? It is important not to miss your dose. Call your doctor or health care professional if you are unable to keep an appointment. What may interact with this medicine? Do not take this medicine with any of the following medications: -bevacizumab This list may not describe all possible interactions. Give your health care provider a list of all the medicines, herbs, non-prescription drugs, or dietary supplements you use. Also tell them if you smoke, drink alcohol, or use illegal drugs. Some items may interact with your medicine. What should I watch for while using this medicine? Visit your doctor for checks on your progress. This drug may make you feel generally unwell. This is not uncommon, as chemotherapy can affect healthy cells as well as cancer cells. Report any side effects. Continue your course of treatment even though you feel ill unless your doctor tells you to stop. This medicine can make you more sensitive to  the sun. Keep out of the sun while receiving this medicine and for 2 months after the last dose. If you cannot avoid being in the sun, wear protective clothing and use sunscreen. Do not use sun lamps or tanning beds/booths. In some cases, you may be given additional medicines to help with side effects. Follow all directions for their use. Call your doctor or health care professional for advice if you get a fever, chills or sore throat, or other symptoms of a cold or flu. Do not treat yourself. This drug decreases your body's ability to fight infections. Try to avoid being around people  who are sick. Avoid taking products that contain aspirin, acetaminophen, ibuprofen, naproxen, or ketoprofen unless instructed by your doctor. These medicines may hide a fever. Do not become pregnant while taking this medicine and for 2 months after the last dose. Women should inform their doctor if they wish to become pregnant or think they might be pregnant. There is a potential for serious side effects to an unborn child. Talk to your health care professional or pharmacist for more information. Do not breast-feed an infant while taking this medicine or for 2 months after the last dose. What side effects may I notice from receiving this medicine? Side effects that you should report to your doctor or health care professional as soon as possible: -allergic reactions like skin rash, itching or hives, swelling of the face, lips, or tongue -breathing problems -changes in vision -eye pain -fast, irregular heartbeat -fever, chills -mouth sores -red spots on the skin -redness, blistering, peeling or loosening of the skin, including inside the mouth -signs and symptoms of kidney injury like trouble passing urine or change in the amount of urine -signs and symptoms of low blood pressure like dizziness; feeling faint or lightheaded, falls; unusually weak or tired -signs of low calcium like fast heartbeat, muscle cramps or muscle pain; pain, tingling, numbness in the hands or feet; seizures -signs and symptoms of low magnesium like muscle cramps, pain, or weakness; tremors; seizures; or fast, irregular heartbeat -signs and symptoms of low potassium like muscle cramps or muscle pain; chest pain; dizziness; feeling faint or lightheaded, falls; palpitations; breathing problems; or fast, irregular heartbeat -swelling of the ankles, feet, hands Side effects that usually do not require medical attention (report to your doctor or health care professional if they continue or are bothersome): -changes in skin like  acne, cracks, skin dryness -diarrhea -eyelash growth -headache -mouth sores -nail changes -nausea, vomiting This list may not describe all possible side effects. Call your doctor for medical advice about side effects. You may report side effects to FDA at 1-800-FDA-1088. Where should I keep my medicine? This drug is given in a hospital or clinic and will not be stored at home. NOTE: This sheet is a summary. It may not cover all possible information. If you have questions about this medicine, talk to your doctor, pharmacist, or health care provider.  2018 Elsevier/Gold Standard (2015-09-28 16:45:04)

## 2017-07-30 NOTE — Progress Notes (Signed)
[facility] Note  Beverly Schwartz came to [facility] seeking appts with Webb Silversmith Cunningham/LCSW and/or me for emotional support related to dx/px. Provided emotional support, questions for discernment, and pastoral reflection as she shared and processed her distress re px without tx and worries about chemo. She is strongly considering chemo now because her top priority is more quality time with her son.  Beverly Schwartz was very appreciative of time and space to process her thoughts, feelings, fears, and hopes. We set up a f/u Spiritual Care appt for 12:30 pm on Monday 5/20.    [place], [place], [facility] Pager [phone] Voicemail [phone]

## 2017-07-31 ENCOUNTER — Telehealth: Payer: Self-pay | Admitting: Nurse Practitioner

## 2017-07-31 NOTE — Telephone Encounter (Signed)
I called patient to f/u on our treatment discussion this week. She will do FOLFIRI/panitumumab, but prefers no PAC and wants to try weekly 5FU bolus with q2 weeks irinotecan/panitumumab. She understands may need port in the future if she has problems with peripheral access. She wants to try chemo but really does not want PAC or to bring chemo home. Message sent to Dr. Burr Medico for update and to amend treatment plan accordingly. Plan to start 08/11/17.

## 2017-08-02 ENCOUNTER — Other Ambulatory Visit: Payer: Self-pay | Admitting: Hematology

## 2017-08-02 DIAGNOSIS — C19 Malignant neoplasm of rectosigmoid junction: Secondary | ICD-10-CM

## 2017-08-02 MED ORDER — PROCHLORPERAZINE MALEATE 10 MG PO TABS: 10.0000 mg | ORAL_TABLET | Freq: Four times a day (QID) | ORAL | 1 refills | Status: DC | PRN

## 2017-08-02 MED ORDER — ONDANSETRON HCL 8 MG PO TABS: 8.0000 mg | ORAL_TABLET | Freq: Two times a day (BID) | ORAL | 1 refills | Status: DC | PRN

## 2017-08-04 ENCOUNTER — Other Ambulatory Visit: Payer: Self-pay

## 2017-08-04 ENCOUNTER — Ambulatory Visit: Payer: Self-pay | Admitting: Hematology

## 2017-08-10 ENCOUNTER — Encounter: Payer: Self-pay | Admitting: General Practice

## 2017-08-10 ENCOUNTER — Telehealth: Payer: Self-pay | Admitting: Hematology

## 2017-08-10 ENCOUNTER — Inpatient Hospital Stay (HOSPITAL_BASED_OUTPATIENT_CLINIC_OR_DEPARTMENT_OTHER): Payer: BLUE CROSS/BLUE SHIELD | Admitting: Genetic Counselor

## 2017-08-10 ENCOUNTER — Encounter: Payer: Self-pay | Admitting: Genetic Counselor

## 2017-08-10 ENCOUNTER — Inpatient Hospital Stay: Payer: BLUE CROSS/BLUE SHIELD

## 2017-08-10 ENCOUNTER — Other Ambulatory Visit: Payer: Self-pay

## 2017-08-10 ENCOUNTER — Other Ambulatory Visit: Payer: Self-pay | Admitting: *Deleted

## 2017-08-10 DIAGNOSIS — C787 Secondary malignant neoplasm of liver and intrahepatic bile duct: Secondary | ICD-10-CM

## 2017-08-10 DIAGNOSIS — Z1379 Encounter for other screening for genetic and chromosomal anomalies: Secondary | ICD-10-CM | POA: Diagnosis not present

## 2017-08-10 DIAGNOSIS — D500 Iron deficiency anemia secondary to blood loss (chronic): Secondary | ICD-10-CM

## 2017-08-10 DIAGNOSIS — C19 Malignant neoplasm of rectosigmoid junction: Secondary | ICD-10-CM

## 2017-08-10 DIAGNOSIS — Z800 Family history of malignant neoplasm of digestive organs: Secondary | ICD-10-CM | POA: Diagnosis not present

## 2017-08-10 DIAGNOSIS — C187 Malignant neoplasm of sigmoid colon: Secondary | ICD-10-CM

## 2017-08-10 DIAGNOSIS — Z803 Family history of malignant neoplasm of breast: Secondary | ICD-10-CM | POA: Diagnosis not present

## 2017-08-10 LAB — CMP (CANCER CENTER ONLY)
ALT: 13 U/L (ref 0–55)
AST: 21 U/L (ref 5–34)
Albumin: 3.5 g/dL (ref 3.5–5.0)
Alkaline Phosphatase: 182 U/L — ABNORMAL HIGH (ref 40–150)
Anion gap: 9 (ref 3–11)
BUN: 11 mg/dL (ref 7–26)
CO2: 27 mmol/L (ref 22–29)
Calcium: 8.4 mg/dL (ref 8.4–10.4)
Chloride: 103 mmol/L (ref 98–109)
Creatinine: 0.9 mg/dL (ref 0.60–1.10)
GFR, Est AFR Am: >60 mL/min (ref >60)
GFR, Estimated: >60 mL/min (ref >60)
Glucose, Bld: 202 mg/dL — ABNORMAL HIGH (ref 70–140)
Potassium: 3.9 mmol/L (ref 3.5–5.1)
Sodium: 139 mmol/L (ref 136–145)
Total Bilirubin: 0.4 mg/dL (ref 0.2–1.2)
Total Protein: 7.5 g/dL (ref 6.4–8.3)

## 2017-08-10 LAB — CBC WITH DIFFERENTIAL (CANCER CENTER ONLY)
Basophils Absolute: 0.1 10*3/uL (ref 0.0–0.1)
Basophils Relative: 1 %
Eosinophils Absolute: 0.7 10*3/uL — ABNORMAL HIGH (ref 0.0–0.5)
Eosinophils Relative: 6 %
HCT: 30.4 % — ABNORMAL LOW (ref 34.8–46.6)
Hemoglobin: 9.4 g/dL — ABNORMAL LOW (ref 11.6–15.9)
Lymphocytes Relative: 16 %
Lymphs Abs: 1.8 10*3/uL (ref 0.9–3.3)
MCH: 22.2 pg — ABNORMAL LOW (ref 25.1–34.0)
MCHC: 31 g/dL — ABNORMAL LOW (ref 31.5–36.0)
MCV: 71.6 fL — ABNORMAL LOW (ref 79.5–101.0)
Monocytes Absolute: 0.7 10*3/uL (ref 0.1–0.9)
Monocytes Relative: 6 %
Neutro Abs: 8.1 10*3/uL — ABNORMAL HIGH (ref 1.5–6.5)
Neutrophils Relative %: 71 %
Platelet Count: 289 10*3/uL (ref 145–400)
RBC: 4.25 MIL/uL (ref 3.70–5.45)
RDW: 19.5 % — ABNORMAL HIGH (ref 11.2–14.5)
WBC Count: 11.3 10*3/uL — ABNORMAL HIGH (ref 3.9–10.3)

## 2017-08-10 LAB — PREGNANCY, URINE: Preg Test, Ur: NEGATIVE

## 2017-08-10 NOTE — Progress Notes (Signed)
  Oncology Nurse Navigator Documentation  Navigator Location: [facility] (08/10/17 1415)   )Navigator Encounter Type: Follow-up Appt (08/10/17 1415)                             Interventions: Psycho-social support (08/10/17 1415) Lorrin Jackson, Chaplain, called and asked if I could join a spiritual care consult with patient to discuss barriers to treatment. Patient states that she "might be pregnant" even though the urine pregnancy test came back negative today. Patient also voiced concern that she would not be able to go to the beach with weekly treatments. She also voiced fear of nausea and other side effects. I explained resources available to assist with side effects from chemo and the support network that she has at [facility]. Patient had called scheduling today and cancelled her first chemo tx on 08/11/17. I let her know that I would relay this information to Cira Rue, NP. Encouragement and support offered. I   encouraged patient to reach out to me with questions. Information given to Cira Rue who will call patient.          Acuity: Level 2 (08/10/17 1415)         Time Spent with Patient: 30 (08/10/17 1415)

## 2017-08-10 NOTE — Telephone Encounter (Signed)
Called pt to reschedule treatment - cancelled treatment per 5/20 sch msg. Pt did not give me a date she would like to reschedule to - sent to desk nurse because she said that she was at the hospital.

## 2017-08-10 NOTE — Progress Notes (Signed)
[facility] Spiritual Care Note  ERROR: During chart review, I apparently accidentally clicked on a button that registered me as a reviewer of a recent lab. This action occurred in error.    [place], [place], [facility] Pager [phone] Voicemail [phone]

## 2017-08-10 NOTE — Progress Notes (Signed)
[facility] Spiritual Care Note  Met as scheduled today with Beverly Schwartz for spiritual and emotional support. She is still wrestling with fear/dread about starting chemo and wondering if she should postpone tx to avoid side effects' precluding important personal summer plans.  Beverly Schwartz welcomed inviting GI Navigator Dawn Placke/RN into conversation to name goals (taking her son to the beach for a few days, hosting a childhood friend visiting from [place] in June) and explore whether these goals may be feasible once chemo has started. We encouraged Beverly Schwartz to address her questions and concerns--particularly about side effects and vacation possibilities--with Lacie Burton/NP. Beverly Schwartz agreed, asking Dawn to forward her concerns to [facility] for f/u contact. We also provided reassurance about the [facility] care team and multilayered support, from infusion nurses and printed discharge plans (what to expect and what to do if problems arise) to [facility], APPs and navigator to [facility] team.   Continuing to follow for support, and Beverly Schwartz reaches out herself, but please also page if immediate needs arise, circumstances change, or team consultation would be helpful. Thank you!   [place], [place], [facility] Pager [phone] Voicemail [phone]

## 2017-08-10 NOTE — Progress Notes (Signed)
REFERRING PROVIDER: Truitt Merle, MD [place], [place] [address]  PRIMARY PROVIDER:  Esaw Grandchild, NP  PRIMARY REASON FOR VISIT:  1. 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 Family history of breast cancer   3. Family history of stomach cancer      HISTORY OF PRESENT ILLNESS:   Beverly Schwartz, a 47 y.o. female, was seen for a [place] cancer genetics consultation at the request of Dr. Burr Medico due to a personal and family history of cancer.  Ms. Gentles presents to clinic today to discuss the possibility of a hereditary predisposition to cancer, genetic testing, and to further clarify her future cancer risks, as well as potential cancer risks for family members.   In 2019, at the age of 39, Ms. Hargrove was diagnosed with Cancer of the rectosigmoid junction.. This was treated with chemotherapy.  IHC was performed on her tumor and was found to be negative.    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 Abdominopelvic adenopathy. Rectosigmoid mass with suggestion of partial obstruction as evidenced by large colonic stool burden more proximally. 2.  Aortic Atherosclerosis (ICD10-I70.0).  This is age advanced.      05/31/2017 Tumor Marker    CEA 353.3 (elevated) AFP 4.5 (normal)      05/31/2017 Initial Biopsy    Diagnosis Colon, biopsy, sigmoid - INVASIVE ADENOCARCINOMA - SEE COMMENT      05/31/2017 Imaging    CT CHEST IMPRESSION: 1. No evidence of metastatic disease in the chest. 2. No acute findings.  Aortic Atherosclerosis  (ICD10-I70.0).       05/31/2017 Procedure    Colonoscopy per Dr. Watt Climes Findings: An infiltrative and ulcerated partially obstructing medium-sized mass was found in the recto-sigmoid colon. The mass was circumferential. The mass measured four cm in length. No bleeding was present.   Impression - One small polyp in the rectum. - The examination was otherwise normal. - Malignant partially obstructing tumor in the recto-sigmoid colon. Biopsied. - One medium polyp in the proximal sigmoid colon. - The examination was otherwise normal. - Internal hemorrhoids.      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 Mild progression of hepatic metastasis. 2. Similar rectosigmoid primary with abdominopelvic nodal metastasis. 3.  No acute process or evidence of metastatic disease in the chest. 4. Aortic Atherosclerosis (ICD10-I70.0). Coronary artery atherosclerosis. This is age advanced. 5. Proximal colonic constipation, again suggesting a component of partial obstruction at the primary site. 6. Mild ascending aortic dilatation at 4.1 cm.      08/04/2017 -  Chemotherapy    First line FOLFIRI and Panitumumab every 2 weeks  HORMONAL RISK FACTORS:  Menarche was at age 67.  First live birth at age 62.  OCP use for approximately <5 years.  Ovaries intact: yes.  Hysterectomy: no.  Menopausal status: premenopausal.  HRT use: 0 years. Colonoscopy: yes; abnormal. Mammogram within the last year: yes. Number of breast biopsies: 0. Up to date with pelvic exams:  no. Any excessive radiation exposure in the past:  no  Past Medical History:   Diagnosis Date  . Anxiety   . Cancer ([place])    colon, liver  . Chest pain   . Colon cancer ([place])   . Complication of anesthesia   . Depression   . Diabetes mellitus   . Dizziness   . Family history of breast cancer   . Family history of stomach cancer   . Hyperlipidemia   . Hypertension   . Hypothyroidism   . Obesity   . PONV (postoperative nausea and vomiting)   . Tachycardia     Past Surgical History:  Procedure Laterality Date  . FLEXIBLE SIGMOIDOSCOPY N/A 05/31/2017   Procedure: FLEXIBLE SIGMOIDOSCOPY;  Surgeon: Clarene Essex, MD;  Location: [facility];  Service: Endoscopy;  Laterality: N/A;  . WISDOM TOOTH EXTRACTION     age 16's    Social History   Socioeconomic History  . Marital status: Divorced    Spouse name: Not on file  . Number of children: 1  . Years of education: Not on file  . Highest education level: Not on file  Occupational History  . Occupation: Producer, television/film/video  Social Needs  . Financial resource strain: Not on file  . Food insecurity:    Worry: Not on file    Inability: Not on file  . Transportation needs:    Medical: Not on file    Non-medical: Not on file  Tobacco Use  . Smoking status: Never Smoker  . Smokeless tobacco: Never Used  Substance and Sexual Activity  . Alcohol use: No  . Drug use: No  . Sexual activity: Never  Lifestyle  . Physical activity:    Days per week: Not on file    Minutes per session: Not on file  . Stress: Not on file  Relationships  . Social connections:    Talks on phone: Not on file    Gets together: Not on file    Attends religious service: Not on file    Active member of club or organization: Not on file    Attends meetings of clubs or organizations: Not on file    Relationship status: Not on file  Other Topics Concern  . Not on file  Social History Narrative  . Not on file     FAMILY HISTORY:  We obtained a detailed, 4-generation family history.  Significant diagnoses are listed  below: Family History  Problem Relation Age of Onset  . Heart disease Father   . Healthy Mother   . Hyperlipidemia Sister   . Healthy Brother   . Heart disease Paternal Uncle   . Heart attack Paternal Uncle   . Healthy Son   . Breast cancer Maternal Grandmother        d. in mid 19s  . Stomach cancer Paternal Grandfather        d. 44  . Colon cancer Neg Hx   . Esophageal cancer Neg Hx   . Rectal cancer Neg Hx   . Liver cancer Neg Hx     The patient has one son who is cancer free.  She has a brother and sister who are cancer free.  Her parents are both living and do not have cancer.  The patient's mother has one brother who is cancer free.  Her parents are deceased.  Her mother had breast cancer and died in her 65s and her father died at 47.  The patient's father had a brother and sister.  The brother died of a heart attack.  His parents are both deceased.  His mother died in her 96s and his father died from stomach cancer at 23.  Ms. Ruppe is unaware of previous family history of genetic testing for hereditary cancer risks. Patient's maternal ancestors are of [place] descent, and paternal ancestors are of [place] descent. There is no reported Ashkenazi Jewish ancestry. There is no known consanguinity.  GENETIC COUNSELING ASSESSMENT: CHAITRA MAST is a 47 y.o. female with a personal and family history of cancer which is somewhat suggestive of a hereditary cancer syndrome and predisposition to cancer. We, therefore, discussed and recommended the following at today's visit.   DISCUSSION: We discussed that about 5-6% of colon cancer is hereditary with most cases due to Lynch syndrome.  We discussed that there are other genes that can be associated with early colon cancer or other cancers that are in her family.  We reviewed the characteristics, features and inheritance patterns of hereditary cancer syndromes. We also discussed genetic testing, including the appropriate family members to  test, the process of testing, insurance coverage and turn-around-time for results. We discussed the implications of a negative, positive and/or variant of uncertain significant result. We recommended Ms. Keltz pursue genetic testing for the common hereditary cancer gene panel. The Hereditary Gene Panel offered by [facility] includes sequencing and/or deletion duplication testing of the following 47 genes: APC, ATM, AXIN2, BARD1, BMPR1A, BRCA1, BRCA2, BRIP1, CDH1, CDK4, CDKN2A (p14ARF), CDKN2A (p16INK4a), CHEK2, CTNNA1, DICER1, EPCAM (Deletion/duplication testing only), GREM1 (promoter region deletion/duplication testing only), KIT, MEN1, MLH1, MSH2, MSH3, MSH6, MUTYH, NBN, NF1, NHTL1, PALB2, PDGFRA, PMS2, POLD1, POLE, PTEN, RAD50, RAD51C, RAD51D, SDHB, SDHC, SDHD, SMAD4, SMARCA4. STK11, TP53, TSC1, TSC2, and VHL.  The following genes were evaluated for sequence changes only: SDHA and HOXB13 c.251G>A variant only.   Based on Ms. Heizer's personal and family history of cancer, she meets medical criteria for genetic testing. Despite that she meets criteria, she may still have an out of pocket cost. We discussed that if her out of pocket cost for testing is over $100, the laboratory will call and confirm whether she wants to proceed with testing.  If the out of pocket cost of testing is less than $100 she will be billed by the genetic testing laboratory.   PLAN: Despite our recommendation, Ms. Storlie did not wish to pursue genetic testing at today's visit. She would like [place] to do a benefits investigation to determine the cost of testing.  Based on that result will determine if she decides to pursue genetic testing.  We understand this decision, and remain available to coordinate genetic testing at any time in the future. We, therefore, recommend Ms. Figler continue to follow the cancer screening guidelines given by her primary healthcare provider.  Lastly, we encouraged Ms. Dohner to remain in contact with cancer  genetics annually so that we can continuously update the family history and inform her of any changes in cancer genetics and testing that may be of benefit for this family.   Ms.  Chancy questions were answered to her satisfaction today. Our contact information  was provided should additional questions or concerns arise. Thank you for the referral and allowing [place] to share in the care of your patient.   Jady Braggs P. [place]ne [place], [place], [facility] Certified Genetic Counselor [email or web address] phone: [phone]  The patient was seen for a total of 45 minutes in face-to-face genetic counseling.  This patient was discussed with Drs. Magrinat, Lindi Adie and/or Burr Medico who agrees with the above.    _______________________________________________________________________ For Office Staff:  Number of people involved in session: 1 Was an Intern/ student involved with case: no

## 2017-08-11 ENCOUNTER — Inpatient Hospital Stay: Payer: BLUE CROSS/BLUE SHIELD

## 2017-08-11 ENCOUNTER — Telehealth: Payer: Self-pay | Admitting: Nurse Practitioner

## 2017-08-11 ENCOUNTER — Other Ambulatory Visit: Payer: Self-pay

## 2017-08-11 ENCOUNTER — Inpatient Hospital Stay: Payer: BLUE CROSS/BLUE SHIELD | Admitting: Nurse Practitioner

## 2017-08-11 LAB — CEA (IN HOUSE-CHCC): CEA (CHCC-In House): 754.17 ng/mL — ABNORMAL HIGH (ref 0.00–5.00)

## 2017-08-11 LAB — IRON AND TIBC
Iron: 26 ug/dL — ABNORMAL LOW (ref 41–142)
Saturation Ratios: 7 % — ABNORMAL LOW (ref 21–57)
TIBC: 375 ug/dL (ref 236–444)
UIBC: 349 ug/dL

## 2017-08-11 LAB — FERRITIN: Ferritin: 27 ng/mL (ref 9–269)

## 2017-08-11 NOTE — Telephone Encounter (Signed)
I called patient to discuss recent labs and plan of care. She is questioning the decision to pursue chemo, scared of side effects and losing the ability to enjoy the summer with her son. She is "sensitive to all medicine" and worries about the effects chemo will have on her. I previously discussed with Dr. Burr Medico prior to the phone call, who suggested to offer xeloda 1 week on/1 week off with panitumumab q2 weeks. I reviewed dosing schedule and possible side effects. The patient is very interested in this option.   I reviewed recent labs, including increasing CEA, low iron, and slightly worsening anemia. She is having intermittent rectal bleeding and abdominal pain. Maintains regular BM with miralax. Was not taking iron regularly but recently restarted taking 1 per day. Iron studies are low, I recommend IV Feraheme and reviewed possible side effects. She prefers to increase oral iron to TID before she agrees to IV iron. I urged her to monitor constipation and take miralax PRN. She agrees. She requested f/u this week before she goes out of town. I sent schedule message to add to my schedule 08/14/17 at 9:30. She is aware and appreciates the call.

## 2017-08-12 NOTE — Telephone Encounter (Signed)
Opened in error

## 2017-08-13 ENCOUNTER — Inpatient Hospital Stay: Payer: BLUE CROSS/BLUE SHIELD

## 2017-08-13 NOTE — Progress Notes (Signed)
[place]  [phone] [phone]) [phone]  Clinic Follow up Note   Patient Care Team: Esaw Grandchild, NP as PCP - General (Family Medicine) Delrae Rend, MD as Consulting Physician (Endocrinology) Truitt Merle, MD as Consulting Physician (Hematology) Alla Feeling, NP as Nurse Practitioner (Nurse Practitioner) Clarene Essex, MD as Consulting Physician (Gastroenterology) 08/14/2017   CHIEF COMPLAIN: f/u metastatic sigmoid colon cancer  SUMMARY OF ONCOLOGIC HISTORY: Oncology History   Cancer Staging Malignant neoplasm of rectosigmoid junction [facility]) Staging form: Colon and Rectum, AJCC 8th Edition - Clinical stage from 06/11/2017: Stage IVA (cTX, cN1b, pM1a) - Signed by Truitt Merle, MD on 06/15/2017       Malignant neoplasm of rectosigmoid junction ([place])   05/30/2017 Imaging    CT AP IMPRESSION: 1. 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 Abdominopelvic adenopathy. Rectosigmoid mass with suggestion of partial obstruction as evidenced by large colonic stool burden more proximally. 2.  Aortic Atherosclerosis (ICD10-I70.0).  This is age advanced.      05/31/2017 Tumor Marker    CEA 353.3 (elevated) AFP 4.5 (normal)      05/31/2017 Initial Biopsy    Diagnosis Colon, biopsy, sigmoid - INVASIVE ADENOCARCINOMA - SEE COMMENT      05/31/2017 Imaging    CT CHEST IMPRESSION: 1. No evidence of metastatic disease in the chest. 2. No acute findings.  Aortic Atherosclerosis (ICD10-I70.0).       05/31/2017 Procedure    Colonoscopy per Dr. Watt Climes Findings: An infiltrative and ulcerated partially obstructing medium-sized mass was found in the recto-sigmoid colon. The mass was circumferential. The mass measured four cm in length. No bleeding was present.   Impression - One small polyp in the rectum. - The examination was otherwise normal. - Malignant partially obstructing tumor in the  recto-sigmoid colon. Biopsied. - One medium polyp in the proximal sigmoid colon. - The examination was otherwise normal. - Internal hemorrhoids.      06/03/2017 Initial Diagnosis    Malignant neoplasm of transverse colon ([place])      06/11/2017 Cancer Staging    Staging form: Colon and Rectum, AJCC 8th Edition - Clinical stage from 06/11/2017: Stage IVA (cTX, cN1b, pM1a) - Signed by Truitt Merle, MD on 06/15/2017      06/11/2017 Pathology Results    Liver biopsy confirmed metastatic colon cancer      07/23/2017 Imaging    CT CAP IMPRESSION: 1. Mild progression of hepatic metastasis. 2. Similar rectosigmoid primary with abdominopelvic nodal metastasis. 3.  No acute process or evidence of metastatic disease in the chest. 4. Aortic Atherosclerosis (ICD10-I70.0). Coronary artery atherosclerosis. This is age advanced. 5. Proximal colonic constipation, again suggesting a component of partial obstruction at the primary site. 6. Mild ascending aortic dilatation at 4.1 cm.      08/04/2017 -  Chemotherapy    First line FOLFIRI and Panitumumab every 2 weeks       CURRENT THERAPY: pending first-line Xeloda and Panitumumab  INTERVAL HISTORY: Beverly Schwartz returns today for follow-up.  Beverly Schwartz was seen by nurse practitioner Bella Kennedy on Jul 29, 2017, and agreed to start chemotherapy FOLFIRI and Panitumumab.  Beverly Schwartz changed her mind a few days later, does not want to do a 5-FU pump, but agreed to do 5-FU bolus and did not want a port.  He was scheduled to start a few days ago, but Beverly Schwartz canceled.  They say called her back, and discussed the option of her chemo Xeloda, Beverly Schwartz was interested to, and  came in today to further discuss.  Previous labs from 08/10/2017 of CBC, CMP, Iron, and Ferritin is as follows: all values are WNL except for CBC with WBC at 11.3, Hgb at 9.4, Neutro abs at 8.1, and eosinophils at 0.7. CMP with alkaline phosphatase at 182. Iron studies showed iron at 26 and saturation ratios at 7. Ferritin was WNL  at 27. CEA tumor marker at 754.17.   On review of systems, Beverly Schwartz reports poor appetite, resolved abdominal pain resolved with miralax and a bowel movement x 5 days ago, weight loss. Beverly Schwartz hasn't tried any nutritional supplements at this time. Beverly Schwartz notes that Beverly Schwartz is planning to go to the beach in June with her son. Beverly Schwartz denies any other symptoms. Pertinent positives are listed and detailed within the above HPI.   REVIEW OF SYSTEMS:   Constitutional: Denies fevers, chills or abnormal weight loss, (+) mild fatigue and low appetite Eyes: Denies blurriness of vision Ears, nose, mouth, throat, and face: Denies mucositis or sore throat Respiratory: Denies cough, dyspnea or wheezes Cardiovascular: Denies palpitation, chest discomfort or lower extremity swelling Gastrointestinal: Intermittent nausea and abdominal cramps, mild intermittent hematochezia, Beverly Schwartz is on MiraLAX for constipation Skin: Denies abnormal skin rashes Lymphatics: Denies new lymphadenopathy or easy bruising Neurological:Denies numbness, tingling or new weaknesses Behavioral/Psych: Mood is stable, no new changes  All other systems were reviewed with the patient and are negative.  MEDICAL HISTORY:  Past Medical History:  Diagnosis Date  . Anxiety   . Cancer ([place])    colon, liver  . Chest pain   . Colon cancer ([place])   . Complication of anesthesia   . Depression   . Diabetes mellitus   . Dizziness   . Family history of breast cancer   . Family history of stomach cancer   . Hyperlipidemia   . Hypertension   . Hypothyroidism   . Obesity   . PONV (postoperative nausea and vomiting)   . Tachycardia     SURGICAL HISTORY: Past Surgical History:  Procedure Laterality Date  . FLEXIBLE SIGMOIDOSCOPY N/A 05/31/2017   Procedure: FLEXIBLE SIGMOIDOSCOPY;  Surgeon: Clarene Essex, MD;  Location: [facility];  Service: Endoscopy;  Laterality: N/A;  . WISDOM TOOTH EXTRACTION     age 68's    I have reviewed the social history and family  history with the patient and they are unchanged from previous note.  ALLERGIES:  is allergic to bupropion and amoxicillin.  MEDICATIONS:  Current Outpatient Medications  Medication Sig Dispense Refill  . ALPRAZolam (XANAX) 1 MG tablet Take 1 tablet (1 mg total) by mouth at bedtime as needed for anxiety. 30 tablet 0  . atorvastatin (LIPITOR) 10 MG tablet Take 1 tablet (10 mg total) by mouth daily. 90 tablet 3  . CINNAMON PO Take 1 tablet by mouth daily.     . fluticasone (FLONASE) 50 MCG/ACT nasal spray Place 1 spray into both nostrils daily. (Patient taking differently: Place 1 spray into both nostrils daily as needed for allergies. ) 16 g 2  . insulin NPH-regular Human (NOVOLIN 70/30) (70-30) 100 UNIT/ML injection Inject 25 Units into the skin 2 (two) times daily with a meal.     . levothyroxine (SYNTHROID, LEVOTHROID) 112 MCG tablet Take 112 mcg by mouth daily before breakfast.    . Omega-3 Fatty Acids (FISH OIL) 1000 MG CAPS Take 1,000 mg by mouth daily.     . ondansetron (ZOFRAN) 8 MG tablet Take 1 tablet (8 mg total) by mouth 2 (two) times  daily as needed for refractory nausea / vomiting. Start on day 3 after chemotherapy. 30 tablet 1  . polyethylene glycol (MIRALAX / GLYCOLAX) packet Take 17 g by mouth daily. 14 each 0  . prochlorperazine (COMPAZINE) 10 MG tablet Take 1 tablet (10 mg total) by mouth every 6 (six) hours as needed (NAUSEA). 30 tablet 1  . propranolol (INDERAL) 10 MG tablet Take 1 tablet (10 mg total) by mouth daily. 90 tablet 0  . TURMERIC PO Take 1 capsule by mouth daily.     Mar[place] Kitchen acetaminophen-codeine (TYLENOL #3) 300-30 MG tablet Take 1 tablet by mouth every 6 (six) hours as needed for moderate pain. 30 tablet 0  . capecitabine (XELODA) 500 MG tablet Take 3 tabs every 12 hours, for 7 days, then off for 7 days 42 tablet 0  . clindamycin (CLINDAGEL) 1 % gel Apply topically 2 (two) times daily. 60 g 1   No current facility-administered medications for this visit.      PHYSICAL EXAMINATION:  ECOG PERFORMANCE STATUS: 1 - Symptomatic but completely ambulatory  Vitals:   08/14/17 1004  BP: (!) 147/87  Pulse: 82  Resp: 18  Temp: 98.4 F (36.9 C)  SpO2: 100%   Filed Weights   08/14/17 1004  Weight: 147 lb 4.8 oz (66.8 kg)    GENERAL:alert, no distress and comfortable SKIN: skin color, texture, turgor are normal, no rashes or significant lesions. Moderate papular skin rash on face, more on right.  EYES: normal, Conjunctiva are pink and non-injected, sclera clear OROPHARYNX:no exudate, no erythema and lips, buccal mucosa, and tongue normal  NECK: supple, thyroid normal size, non-tender, without nodularity LYMPH:  no palpable lymphadenopathy in the cervical, axillary or inguinal LUNGS: clear to auscultation and percussion with normal breathing effort HEART: regular rate & rhythm and no murmurs and no lower extremity edema ABDOMEN:abdomen soft, (+) 2-3 cm hepatomegaly. Mild abdominal tenderness.  Musculoskeletal:no cyanosis of digits and no clubbing  NEURO: alert & oriented x 3 with fluent speech, no focal motor/sensory deficits  LABORATORY DATA:  I have reviewed the data as listed CBC Latest Ref Rng & Units 08/10/2017 07/29/2017 07/07/2017  WBC 3.9 - 10.3 K/uL 11.3(H) 10.6(H) 9.9  Hemoglobin 11.6 - 15.9 g/dL 9.4(L) 9.8(L) 10.2(L)  Hematocrit 34.8 - 46.6 % 30.4(L) 31.2(L) 32.6(L)  Platelets 145 - 400 K/uL 289 282 257     CMP Latest Ref Rng & Units 08/10/2017 07/29/2017 07/07/2017  Glucose 70 - 140 mg/dL 202(H) 207(H) 155(H)  BUN 7 - 26 mg/dL '11 10 10  '$ Creatinine 0.60 - 1.10 mg/dL 0.90 0.82 0.79  Sodium 136 - 145 mmol/L 139 140 140  Potassium 3.5 - 5.1 mmol/L 3.9 4.0 4.2  Chloride 98 - 109 mmol/L 103 105 105  CO2 22 - 29 mmol/L '27 27 26  '$ Calcium 8.4 - 10.4 mg/dL 8.4 8.2(L) 8.7  Total Protein 6.4 - 8.3 g/dL 7.5 7.3 7.8  Total Bilirubin 0.2 - 1.2 mg/dL 0.4 0.3 0.3  Alkaline Phos 40 - 150 U/L 182(H) 194(H) 182(H)  AST 5 - 34 U/L '21 22 20  '$ ALT 0  - 55 U/L '13 12 11   '$ PATHOLOGY REPORT:  Diagnosis 06/11/2017 Liver, needle/core biopsy - METASTATIC ADENOCARCINOMA, CONSISTENT WITH COLONIC PRIMARY.   RADIOGRAPHIC STUDIES: I have personally reviewed the radiological images as listed and agreed with the findings in the report. No results found.  CT CAP w contrast, 07/23/2017 IMPRESSION 1. Mild progression of hepatic metastasis. 2. Similar rectosigmoid primary with abdominopelvic nodal metastasis. 3.  No acute process or evidence of metastatic disease in the chest. 4. Aortic Atherosclerosis (ICD10-I70.0). Coronary artery atherosclerosis. This is age advanced. 5. Proximal colonic constipation, again suggesting a component of partial obstruction at the primary site. 6. Mild ascending aortic dilatation at 4.1 cm. ASSESSMENT & PLAN: 47 y.o. Beverly Schwartz female, with past medical history of diabetes, hypertension, presented with left lower quadrant abdominal pain, hematochezia, fatigue and weight loss  1. Sigmoid adenocarcinoma, with abdominopelvic adenopathy and hepatic metastasis, stage IV, MSI-stable  -We reviewed medical records including imaging and pathology with the patient and family in detail. Beverly Schwartz has what appears to be metastatic sigmoid adenocarcinoma to abdominopelvic lymph nodes and liver.  -I discussed her liver biopsy results with patient and her mother, which confirmed metastatic colon cancer.  I have requested her biopsy to be sent out for genomic testing foundation one -Diffuse liver metastasis, her cancer unfortunately is incurable.  We discussed the goal of therapy is palliative to prolong her life and preserve her quality of life. -We reviewed the natural history of metastatic colon cancer, the median survival is usually about a year without treatment, and median survival usually 2-3 years with systemic chemotherapy and biological agent.  I discussed that left side colon cancer has overall better prognosis than right side colon  cancer. -We again discussed the indication for surgery, such as severe bleeding, bowel obstruction.  I think Beverly Schwartz needs surgery at this point, I strongly encouraged her to consider systemic therapy, such as mFOLFOX6 or FOLFIRI.  -Patient has been struggling on her decision about chemo.  Beverly Schwartz is very reluctant to commit intravenous chemo, due to the concern of potential side effects.  Beverly Schwartz has declined 5-FU pump infusion, and does not want a port placement for now. -Beverly Schwartz has developed more symptoms related to cancer, and has lost more weight.  We again discussed the overall poor prognosis of untreated metastatic colon cancer, and I strongly encouraged her to consider chemotherapy. -I discussed as option of chemotherapy, specifically oral agent Xeloda.  Potential benefits and side effects, especially skin toxicity, diarrhea, fatigue, anorexia, nausea, etc. were discussed with patient in details.  Due to her extreme concern of side effects from chemo, I recommend her to consider Xeloda 1 week on 1 week off and then IV paratuminab infusion q 2 weeks. Beverly Schwartz agrees to start chemotherapy next week.  -The goal of therapy is palliative, loss of life, and improve her quality of life. -Reviewed recent CT CAP w contrast from 07/23/2017 with the patient today. -I will prescribe Tylenol #3 for the patient to use for intermittent abdominal pain. -Lab, and f/u with Lacie or me and Panitumumab on 5/30 and 6/13   2. Anemia, Iron deficiency -Beverly Schwartz had mild anemia in 02/2015, Hgb 11.8; do not have records previous to that.  Beverly Schwartz has microcytic, hypochromic anemia on 05/30/2017 at presentation to ER.  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   -Due to her moderate anemia, and pending chemotherapy, I previously recommended to intravenous iron.  Beverly Schwartz declined.  Beverly Schwartz has started oral iron. Beverly Schwartz is taking 2 a day, however Beverly Schwartz notes that Beverly Schwartz decreased due to constipation.    3. DM, HTN, HL -We previously discussed possible effects of chemotherapy and steroid use on her DM, Beverly Schwartz will need to check BG closely and possibly adjust insulin if hyperglycemia is not well controlled; we reviewed the importance of tight glucose control while on chemotherapy.  4.  Weight loss -presented with 35 lbs weight loss, some of which was intentional. Referral to nutrition to follow while on chemotherapy. We reviewed importance of adequate nutrition in order to tolerate chemotherapy  -Appointment with dietitian tomorrow, but Beverly Schwartz wants to schedule due to conflict of her other schedule  -Beverly Schwartz is 147 lbs today (08/14/2017) down from 154 lbs on 07/29/2017 -I advised the patient to start consuming nutritional supplements to aid with weight loss.   5.  Genetics -Due to her young age and personal history of colon cancer, Beverly Schwartz qualifies for genetics counseling to rule out inheritable cancer syndrome such as Lynch syndrome.  Beverly Schwartz agrees, referral was made   6. Social issues, Anxiety, depression, rash -Beverly Schwartz is a single parent with 51 year old son; her child's father is involved. Patient currently lives with her mother and has social support. Beverly Schwartz has met our SW -Beverly Schwartz follows up with Social Worker Hollice Espy -I will refer the patient to a counselor to aid with her anxiety regarding chemotherapy.  -I will refill her xanax today.  -Beverly Schwartz hasn't tried mirtazapin in the past, Beverly Schwartz has tried wellbutrin in the past.  -Will prescribe clindamycin gel for use intermittently if the patient has a rash.   7. Goal of care discussion  -We again discussed the incurable nature of her cancer, and the overall poor prognosis, especially if Beverly Schwartz does not have good response to chemotherapy or progress on chemo -The patient understands the goal of care is palliative. -Beverly Schwartz is full code for now    PLAN: -Will refill xanax -Will prescribe Tylenol #3 -Lab, and f/u with Lacie or me and Panitumumab on 5/30 and 6/13  -I  discussed with our [place] about her referral to psychiatry, Beverly Schwartz will help to arrange  -Xeloda '1500mg'$  bid, one week on and one week off ordered today, Beverly Schwartz will start next week 5/30      No problem-specific Assessment & Plan notes found for this encounter.   No orders of the defined types were placed in this encounter.  All questions were answered. The patient knows to call the clinic with any problems, questions or concerns. No barriers to learning was detected.  I spent 25 minutes counseling the patient face to face. The total time spent in the appointment was 30 minutes and more than 50% was on counseling and review of test results This document serves as a record of services personally performed by Truitt Merle, MD. It was created on her behalf by Steva Colder, a trained medical scribe. The creation of this record is based on the scribe's personal observations and the provider's statements to them.   I have reviewed the above documentation for accuracy and completeness, and I agree with the above.      Truitt Merle, MD 08/14/2017

## 2017-08-14 ENCOUNTER — Encounter: Payer: Self-pay | Admitting: General Practice

## 2017-08-14 ENCOUNTER — Inpatient Hospital Stay (HOSPITAL_BASED_OUTPATIENT_CLINIC_OR_DEPARTMENT_OTHER): Payer: BLUE CROSS/BLUE SHIELD | Admitting: Hematology

## 2017-08-14 ENCOUNTER — Encounter: Payer: Self-pay | Admitting: Hematology

## 2017-08-14 VITALS — BP 147/87 | HR 82 | Temp 98.4°F | Resp 18 | Ht 65.0 in | Wt 147.3 lb

## 2017-08-14 DIAGNOSIS — D509 Iron deficiency anemia, unspecified: Secondary | ICD-10-CM | POA: Diagnosis not present

## 2017-08-14 DIAGNOSIS — C19 Malignant neoplasm of rectosigmoid junction: Secondary | ICD-10-CM

## 2017-08-14 DIAGNOSIS — F419 Anxiety disorder, unspecified: Secondary | ICD-10-CM

## 2017-08-14 DIAGNOSIS — C787 Secondary malignant neoplasm of liver and intrahepatic bile duct: Secondary | ICD-10-CM | POA: Diagnosis not present

## 2017-08-14 DIAGNOSIS — E119 Type 2 diabetes mellitus without complications: Secondary | ICD-10-CM | POA: Diagnosis not present

## 2017-08-14 DIAGNOSIS — C187 Malignant neoplasm of sigmoid colon: Secondary | ICD-10-CM

## 2017-08-14 DIAGNOSIS — R21 Rash and other nonspecific skin eruption: Secondary | ICD-10-CM

## 2017-08-14 MED ORDER — ALPRAZOLAM 1 MG PO TABS: 1.0000 mg | ORAL_TABLET | Freq: Every evening | ORAL | 0 refills | Status: DC | PRN

## 2017-08-14 MED ORDER — CLINDAMYCIN PHOSPHATE 1 % EX GEL: Freq: Two times a day (BID) | CUTANEOUS | 1 refills | Status: DC

## 2017-08-14 MED ORDER — ACETAMINOPHEN-CODEINE #3 300-30 MG PO TABS
1.0000 | ORAL_TABLET | Freq: Four times a day (QID) | ORAL | 0 refills | Status: DC | PRN
Start: 2017-08-14 — End: 2017-12-02

## 2017-08-14 MED ORDER — CAPECITABINE 500 MG PO TABS: ORAL_TABLET | ORAL | 0 refills | Status: DC

## 2017-08-14 NOTE — Progress Notes (Signed)
[place] CSW Progress Note  Request received from MD to facilitate referral for medications management appt for patient.  Spoke w patient, she is agreeable to referral to [facility].  Staff message sent requesting appointment.  Clinic is now closed for holiday weekend - will reopen next week.  Edwyna Shell, LCSW Clinical Social Worker Phone:  [phone]

## 2017-08-15 ENCOUNTER — Encounter: Payer: Self-pay | Admitting: Hematology

## 2017-08-18 ENCOUNTER — Telehealth: Payer: Self-pay | Admitting: Pharmacy Technician

## 2017-08-18 ENCOUNTER — Inpatient Hospital Stay: Payer: BLUE CROSS/BLUE SHIELD

## 2017-08-18 ENCOUNTER — Telehealth: Payer: Self-pay | Admitting: Pharmacist

## 2017-08-18 DIAGNOSIS — C19 Malignant neoplasm of rectosigmoid junction: Secondary | ICD-10-CM

## 2017-08-18 MED ORDER — CAPECITABINE 500 MG PO TABS: 1500.0000 mg | ORAL_TABLET | Freq: Two times a day (BID) | ORAL | 0 refills | Status: DC

## 2017-08-18 NOTE — Telephone Encounter (Signed)
Oral Oncology Patient Advocate Encounter  Prior Authorization for Xeloda has been approved.    Effective dates: 08/18/2017 through 08/17/2018  Oral Oncology Clinic will continue to follow.   Beverly Schwartz. [name], [place] Patient [place] [phone] 08/18/2017 2:37 PM

## 2017-08-18 NOTE — Telephone Encounter (Signed)
Oral Chemotherapy Pharmacist Encounter   Attempted to reach patient to provide update and offer for initial counseling on oral medication: Xeloda (capecitabine).  No answer. Left VM for patient to call back with any questions or issues.   Insurance authorization has been approved  Xeloda prescription has been e-scribed to the [employer] (ph: [phone]) for dispensing as the [facility] is not in patient's insurance network.  Dispensing pharmacy has been notified about urgent start date.  Above information and phone number to dispensing pharmacy provided on voicemail.  Phone number to oral oncology clinic also left on VM if patient would like initial counseling or has further questions.  Oral Oncology Clinic will continue to follow.  Thank you,  Johny Drilling, PharmD, BCPS, BCOP  08/18/2017   2:34 PM Oral Oncology Clinic [phone]

## 2017-08-18 NOTE — Telephone Encounter (Signed)
Oral Oncology Pharmacist Encounter  Oral Chemotherapy Pharmacist Encounter   Attempted to reach patient to provide update and offer for initial counseling on oral medication: Xeloda (capecitabine).   No answer. No voicemail ever picked up so unable to leave message.  I will continue to try to contact patient to provide update on Xeloda.  If I am unable to reach patient at provided phone number I will contact emergency contact.  Once insurance authorization is obtained (it is still pending) prescription for Xeloda will be E scribed to [facility] (phone: [phone]) as the [facility] is not in network for patient's prescription insurance coverage.  Copayment will not be known until insurance authorization is obtained and prescription can be processed at dispensing specialty pharmacy.  We will alert the dispensing pharmacy of ASAP start date once we are able to send prescription.  Oral oncology clinic will continue to follow.  Thank you,  Johny Drilling, PharmD, BCPS, BCOP  08/18/2017   11:09 AM Oral Oncology Clinic [phone]

## 2017-08-18 NOTE — Telephone Encounter (Signed)
Oral Oncology Pharmacist Encounter  Received new prescription for Xeloda (capeciatbine) for the treatment of metastatic, Kras/Nras WT rectal cancer in conjunction with panitumumab, planned duration until disease progression or unacceptable toxicity.  Labs from 08/10/17 assessed, OK for treatment.  Current medication list in Epic reviewed, no significant DDIs with Xeloda identified.  Prescription will be sent to appropriate specialty pharmacy once insurance authorization is obtained. Insurance authorization has been submitted, marked as urgent, is pending.  [place] is not in network with patient's prescription insurance coverage.  Noted hopeful start date of 08/20/17, it will likely be somewhere 5/31-6/4 for Xeloda with all the transitions of care that must be addressed before medication is acquired.  Oral Oncology Clinic will continue to follow for insurance authorization, copayment issues, initial counseling and start date.  Johny Drilling, PharmD, BCPS, BCOP  08/18/2017 10:36 AM Oral Oncology Clinic [phone]

## 2017-08-18 NOTE — Telephone Encounter (Signed)
Oral Oncology Patient Advocate Encounter  Received notification from [facility] that prior authorization for Xeloda is required.  An Urgent PA request has been submitted on CoverMyMeds Key [id number] Status is pending  Oral Oncology Clinic will continue to follow.  Fabio Asa. [name], [place] Patient [place] [phone] 08/18/2017 10:35 AM

## 2017-08-19 ENCOUNTER — Other Ambulatory Visit: Payer: Self-pay | Admitting: Adult Health

## 2017-08-20 ENCOUNTER — Telehealth: Payer: Self-pay | Admitting: General Practice

## 2017-08-20 ENCOUNTER — Encounter: Payer: Self-pay | Admitting: General Practice

## 2017-08-20 NOTE — Telephone Encounter (Signed)
[place] CSW Progress Note  CSW spoke w [employer] re patient's disability claim.  Per [facility], SSA has been trying to reach patient to verify information and have been unable to reach patient.  Patient needs to call Nada Libman at [phone] ext [phone].  Patient advised of this information, encouraged to call to complete process of filing for disability.  Edwyna Shell, LCSW Clinical Social Worker Phone:  [phone]

## 2017-08-20 NOTE — Progress Notes (Signed)
[place] CSW Progress Note  Spoke w patient, says that her Affordable Care Act insurance "has been terminated."  Is now uninsured.  Encouraged patient to go to DSS to apply for Medicaid.  States she will do so.  Also encouraged her to call [employer] for greater understanding of how uninsured patients can receive care at [facility].  Patient also questioning status of her disability application - CSW will call [place] to get status update and advise patient.    Edwyna Shell, LCSW Clinical Social Worker Phone:  [phone]

## 2017-08-24 ENCOUNTER — Telehealth: Payer: Self-pay | Admitting: Pharmacist

## 2017-08-24 NOTE — Telephone Encounter (Signed)
Oral Oncology Pharmacist Encounter  I spoke with patient today regarding Xeloda acquisition. Patient states she has lost her insurance.  I will plan to counsel patient while in the office tomorrow (08/25/17) We will arrange for 1st fill of Xeloda at that time from the [facility].  Patient with pre-existing acne/dry skin.  She may be someone who can benefit from prophylaxis for EGFR skin rash. Patient stated she would think about it, and discuss with [facility] tomorrow if she wanted something.  Will confirm insurance status with patient tomorrow morning as well.  Oral Oncology Clinic will continue to follow.  Johny Drilling, PharmD, BCPS, BCOP  08/24/2017 10:46 AM Oral Oncology Clinic [phone]

## 2017-08-25 ENCOUNTER — Inpatient Hospital Stay: Payer: BLUE CROSS/BLUE SHIELD

## 2017-08-25 ENCOUNTER — Inpatient Hospital Stay: Payer: BLUE CROSS/BLUE SHIELD | Attending: Hematology | Admitting: Nurse Practitioner

## 2017-08-25 ENCOUNTER — Telehealth: Payer: Self-pay | Admitting: Nurse Practitioner

## 2017-08-25 ENCOUNTER — Telehealth: Payer: Self-pay | Admitting: Pharmacist

## 2017-08-25 ENCOUNTER — Encounter: Payer: Self-pay | Admitting: Hematology

## 2017-08-25 ENCOUNTER — Encounter: Payer: Self-pay | Admitting: Nurse Practitioner

## 2017-08-25 VITALS — BP 138/85 | HR 84 | Temp 98.5°F | Resp 18 | Ht 65.0 in | Wt 146.1 lb

## 2017-08-25 DIAGNOSIS — D500 Iron deficiency anemia secondary to blood loss (chronic): Secondary | ICD-10-CM

## 2017-08-25 DIAGNOSIS — C187 Malignant neoplasm of sigmoid colon: Secondary | ICD-10-CM

## 2017-08-25 DIAGNOSIS — C787 Secondary malignant neoplasm of liver and intrahepatic bile duct: Secondary | ICD-10-CM

## 2017-08-25 DIAGNOSIS — C19 Malignant neoplasm of rectosigmoid junction: Secondary | ICD-10-CM

## 2017-08-25 DIAGNOSIS — I10 Essential (primary) hypertension: Secondary | ICD-10-CM

## 2017-08-25 DIAGNOSIS — E119 Type 2 diabetes mellitus without complications: Secondary | ICD-10-CM | POA: Diagnosis not present

## 2017-08-25 DIAGNOSIS — Z5112 Encounter for antineoplastic immunotherapy: Secondary | ICD-10-CM | POA: Diagnosis not present

## 2017-08-25 DIAGNOSIS — Z79899 Other long term (current) drug therapy: Secondary | ICD-10-CM | POA: Insufficient documentation

## 2017-08-25 DIAGNOSIS — R634 Abnormal weight loss: Secondary | ICD-10-CM | POA: Diagnosis not present

## 2017-08-25 DIAGNOSIS — E039 Hypothyroidism, unspecified: Secondary | ICD-10-CM | POA: Insufficient documentation

## 2017-08-25 DIAGNOSIS — D509 Iron deficiency anemia, unspecified: Secondary | ICD-10-CM | POA: Diagnosis not present

## 2017-08-25 DIAGNOSIS — C189 Malignant neoplasm of colon, unspecified: Secondary | ICD-10-CM

## 2017-08-25 LAB — CMP (CANCER CENTER ONLY)
ALT: 7 U/L (ref 0–55)
AST: 24 U/L (ref 5–34)
Albumin: 3.4 g/dL — ABNORMAL LOW (ref 3.5–5.0)
Alkaline Phosphatase: 227 U/L — ABNORMAL HIGH (ref 40–150)
Anion gap: 11 (ref 3–11)
BUN: 11 mg/dL (ref 7–26)
CO2: 22 mmol/L (ref 22–29)
Calcium: 8.1 mg/dL — ABNORMAL LOW (ref 8.4–10.4)
Chloride: 106 mmol/L (ref 98–109)
Creatinine: 0.84 mg/dL (ref 0.60–1.10)
GFR, Est AFR Am: >60 mL/min (ref >60)
GFR, Estimated: >60 mL/min (ref >60)
Glucose, Bld: 176 mg/dL — ABNORMAL HIGH (ref 70–140)
Potassium: 3.8 mmol/L (ref 3.5–5.1)
Sodium: 139 mmol/L (ref 136–145)
Total Bilirubin: 0.3 mg/dL (ref 0.2–1.2)
Total Protein: 7.5 g/dL (ref 6.4–8.3)

## 2017-08-25 LAB — CEA (IN HOUSE-CHCC): CEA (CHCC-In House): 862.87 ng/mL — ABNORMAL HIGH (ref 0.00–5.00)

## 2017-08-25 LAB — CBC WITH DIFFERENTIAL (CANCER CENTER ONLY)
Basophils Absolute: 0.1 10*3/uL (ref 0.0–0.1)
Basophils Relative: 1 %
Eosinophils Absolute: 0.6 10*3/uL — ABNORMAL HIGH (ref 0.0–0.5)
Eosinophils Relative: 6 %
HCT: 31.2 % — ABNORMAL LOW (ref 34.8–46.6)
Hemoglobin: 9.2 g/dL — ABNORMAL LOW (ref 11.6–15.9)
Lymphocytes Relative: 19 %
Lymphs Abs: 1.9 10*3/uL (ref 0.9–3.3)
MCH: 21.9 pg — ABNORMAL LOW (ref 25.1–34.0)
MCHC: 29.5 g/dL — ABNORMAL LOW (ref 31.5–36.0)
MCV: 74.3 fL — ABNORMAL LOW (ref 79.5–101.0)
Monocytes Absolute: 0.7 10*3/uL (ref 0.1–0.9)
Monocytes Relative: 7 %
Neutro Abs: 6.7 10*3/uL — ABNORMAL HIGH (ref 1.5–6.5)
Neutrophils Relative %: 67 %
Platelet Count: 272 10*3/uL (ref 145–400)
RBC: 4.2 MIL/uL (ref 3.70–5.45)
RDW: 16.9 % — ABNORMAL HIGH (ref 11.2–14.5)
WBC Count: 10 10*3/uL (ref 3.9–10.3)

## 2017-08-25 MED ORDER — SODIUM CHLORIDE 0.9 % IV SOLN
Freq: Once | INTRAVENOUS | Status: AC
  Administered 2017-08-25: 11:00:00 via INTRAVENOUS

## 2017-08-25 MED ORDER — SODIUM CHLORIDE 0.9 % IV SOLN
5.8000 mg/kg | Freq: Once | INTRAVENOUS | Status: AC
  Administered 2017-08-25: 400 mg via INTRAVENOUS
  Filled 2017-08-25: qty 20

## 2017-08-25 NOTE — Progress Notes (Signed)
Went to treatment area to discuss [employer] with patient.  Patient was approved for one-time $400 [employer]. Patient has a copy of the approval letter as well as the expense sheet it covers along with the outpatient pharmacy information. Patient has my card for any additional financial questions or concerns. Account set up in [place].  Also gave patient contact information for Tito Dine at [employer] in notes by [place] for patient to contact. She states she tried to call back and couldn't reach her but would call again.

## 2017-08-25 NOTE — Telephone Encounter (Signed)
No LOS 6/4 

## 2017-08-25 NOTE — Progress Notes (Signed)
[place]  [phone] [phone]  Clinic Follow up Note   Patient Care Team: Esaw Grandchild, NP as PCP - General (Family Medicine) Delrae Rend, MD as Consulting Physician (Endocrinology) Truitt Merle, MD as Consulting Physician (Hematology) Alla Feeling, NP as Nurse Practitioner (Nurse Practitioner) Clarene Essex, MD as Consulting Physician (Gastroenterology) 08/25/2017  SUMMARY OF ONCOLOGIC HISTORY: Oncology History   Cancer Staging Malignant neoplasm of rectosigmoid junction [facility]) Staging form: Colon and Rectum, AJCC 8th Edition - Clinical stage from 06/11/2017: Stage IVA (cTX, cN1b, pM1a) - Signed by Truitt Merle, MD on 06/15/2017       Malignant neoplasm of rectosigmoid junction ([place])   05/30/2017 Imaging    CT AP IMPRESSION: 1. 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 Abdominopelvic adenopathy. Rectosigmoid mass with suggestion of partial obstruction as evidenced by large colonic stool burden more proximally. 2.  Aortic Atherosclerosis (ICD10-I70.0).  This is age advanced.      05/31/2017 Tumor Marker    CEA 353.3 (elevated) AFP 4.5 (normal)      05/31/2017 Initial Biopsy    Diagnosis Colon, biopsy, sigmoid - INVASIVE ADENOCARCINOMA - SEE COMMENT      05/31/2017 Imaging    CT CHEST IMPRESSION: 1. No evidence of metastatic disease in the chest. 2. No acute findings.  Aortic Atherosclerosis (ICD10-I70.0).       05/31/2017 Procedure    Colonoscopy per Dr. Watt Climes Findings: An infiltrative and ulcerated partially obstructing medium-sized mass was found in the recto-sigmoid colon. The mass was circumferential. The mass measured four cm in length. No bleeding was present.   Impression - One small polyp in the rectum. - The examination was otherwise normal. - Malignant partially obstructing tumor in the recto-sigmoid colon. Biopsied. - One medium polyp in the proximal  sigmoid colon. - The examination was otherwise normal. - Internal hemorrhoids.      06/03/2017 Initial Diagnosis    Malignant neoplasm of transverse colon ([place])      06/11/2017 Cancer Staging    Staging form: Colon and Rectum, AJCC 8th Edition - Clinical stage from 06/11/2017: Stage IVA (cTX, cN1b, pM1a) - Signed by Truitt Merle, MD on 06/15/2017      06/11/2017 Pathology Results    Liver biopsy confirmed metastatic colon cancer      07/23/2017 Imaging    CT CAP IMPRESSION: 1. Mild progression of hepatic metastasis. 2. Similar rectosigmoid primary with abdominopelvic nodal metastasis. 3.  No acute process or evidence of metastatic disease in the chest. 4. Aortic Atherosclerosis (ICD10-I70.0). Coronary artery atherosclerosis. This is age advanced. 5. Proximal colonic constipation, again suggesting a component of partial obstruction at the primary site. 6. Mild ascending aortic dilatation at 4.1 cm.      08/04/2017 -  Chemotherapy    First line FOLFIRI and Panitumumab every 2 weeks      CURRENT THERAPY: PENDING Xeloda 1500 mg BID  x7 days on/7 days off and vectibix q2 weeks   INTERVAL HISTORY: Ms. Naeem presents for follow-up and to begin chemotherapy.  She reports her insurance will term on 08/26/2017 due to being dropped from [employer].  Denies to have low energy.  She has low appetite and gets full quickly, drinks protein shakes. Abdominal pain to RUQ and LLQ is mild, <5/10. She does not take pain medication.  Denies nausea, vomiting, constipation, diarrhea, or rectal bleeding. Takes 1-2 iron tabs daily. Has regular BM with miralax PRN. Notes an occasional dry cough.   REVIEW  OF SYSTEMS:   Constitutional: Denies fevers, chills or abnormal weight loss (+) fatigue (+) low appetite  Eyes: Denies blurriness of vision Ears, nose, mouth, throat, and face: Denies mucositis or sore throat Respiratory: Denies dyspnea or wheezes (+) occasional dry cough  Cardiovascular: Denies  palpitation, chest discomfort or lower extremity swelling Gastrointestinal:  Denies nausea, vomiting, constipation, diarrhea, hematochezia, heartburn or change in bowel habits (+) early satiety (+) intermittent mild RUQ and LLQ pain  Skin: Denies abnormal skin rashes (+) facial and chest acne  Lymphatics: Denies new lymphadenopathy or easy bruising Neurological:Denies numbness, tingling or new weaknesses Behavioral/Psych: Mood is stable, no new changes  All other systems were reviewed with the patient and are negative.  MEDICAL HISTORY:  Past Medical History:  Diagnosis Date  . Anxiety   . Cancer ([place])    colon, liver  . Chest pain   . Colon cancer ([place])   . Complication of anesthesia   . Depression   . Diabetes mellitus   . Dizziness   . Family history of breast cancer   . Family history of stomach cancer   . Hyperlipidemia   . Hypertension   . Hypothyroidism   . Obesity   . PONV (postoperative nausea and vomiting)   . Tachycardia     SURGICAL HISTORY: Past Surgical History:  Procedure Laterality Date  . FLEXIBLE SIGMOIDOSCOPY N/A 05/31/2017   Procedure: FLEXIBLE SIGMOIDOSCOPY;  Surgeon: Clarene Essex, MD;  Location: [facility];  Service: Endoscopy;  Laterality: N/A;  . WISDOM TOOTH EXTRACTION     age 41's    I have reviewed the social history and family history with the patient and they are unchanged from previous note.  ALLERGIES:  is allergic to bupropion and amoxicillin.  MEDICATIONS:  Current Outpatient Medications  Medication Sig Dispense Refill  . ALPRAZolam (XANAX) 1 MG tablet Take 1 tablet (1 mg total) by mouth at bedtime as needed for anxiety. 30 tablet 0  . CINNAMON PO Take 1 tablet by mouth daily.     . clindamycin (CLINDAGEL) 1 % gel Apply topically 2 (two) times daily. 60 g 1  . fluticasone (FLONASE) 50 MCG/ACT nasal spray Place 1 spray into both nostrils daily. (Patient taking differently: Place 1 spray into both nostrils daily as needed for allergies.  ) 16 g 2  . insulin NPH-regular Human (NOVOLIN 70/30) (70-30) 100 UNIT/ML injection Inject 25 Units into the skin 2 (two) times daily with a meal.     . levothyroxine (SYNTHROID, LEVOTHROID) 112 MCG tablet Take 112 mcg by mouth daily before breakfast.    . Omega-3 Fatty Acids (FISH OIL) 1000 MG CAPS Take 1,000 mg by mouth daily.     . polyethylene glycol (MIRALAX / GLYCOLAX) packet Take 17 g by mouth daily. 14 each 0  . propranolol (INDERAL) 10 MG tablet TAKE 1 TABLET BY MOUTH EVERY DAY 90 tablet 0  . TURMERIC PO Take 1 capsule by mouth daily.     Marland Kitchen acetaminophen-codeine (TYLENOL #3) 300-30 MG tablet Take 1 tablet by mouth every 6 (six) hours as needed for moderate pain. (Patient not taking: Reported on 08/25/2017) 30 tablet 0  . atorvastatin (LIPITOR) 10 MG tablet Take 1 tablet (10 mg total) by mouth daily. 90 tablet 3  . capecitabine (XELODA) 500 MG tablet Take 3 tablets (1,500 mg total) by mouth 2 (two) times daily after a meal. Take on days 1-7 & 15-21 of each 28 day cycle 84 tablet 0  . ondansetron (ZOFRAN) 8 MG  tablet Take 1 tablet (8 mg total) by mouth 2 (two) times daily as needed for refractory nausea / vomiting. Start on day 3 after chemotherapy. 30 tablet 1  . prochlorperazine (COMPAZINE) 10 MG tablet Take 1 tablet (10 mg total) by mouth every 6 (six) hours as needed (NAUSEA). (Patient not taking: Reported on 08/25/2017) 30 tablet 1   No current facility-administered medications for this visit.     PHYSICAL EXAMINATION: ECOG PERFORMANCE STATUS: 1 - Symptomatic but completely ambulatory  Vitals:   08/25/17 0900  BP: 138/85  Pulse: 84  Resp: 18  Temp: 98.5 F (36.9 C)  SpO2: 100%   Filed Weights   08/25/17 0900  Weight: 146 lb 1.6 oz (66.3 kg)    GENERAL:alert, no distress and comfortable SKIN: no significant lesions (+) acne to face and chest  EYES: normal, Conjunctiva are pink and non-injected, sclera clear OROPHARYNX:no thrush or ulcers   LYMPH:  no palpable cervical or  supraclavicular lymphadenopathy LUNGS: clear to auscultation with normal breathing effort HEART: regular rate & rhythm and no murmurs and no lower extremity edema ABDOMEN:abdomen soft and normal bowel sounds. (+) Hepatomegaly with mild tenderness  Musculoskeletal:no cyanosis of digits and no clubbing  NEURO: alert & oriented x 3 with fluent speech, no focal motor/sensory deficits  LABORATORY DATA:  I have reviewed the data as listed CBC Latest Ref Rng & Units 08/25/2017 08/10/2017 07/29/2017  WBC 3.9 - 10.3 K/uL 10.0 11.3(H) 10.6(H)  Hemoglobin 11.6 - 15.9 g/dL 9.2(L) 9.4(L) 9.8(L)  Hematocrit 34.8 - 46.6 % 31.2(L) 30.4(L) 31.2(L)  Platelets 145 - 400 K/uL 272 289 282     CMP Latest Ref Rng & Units 08/25/2017 08/10/2017 07/29/2017  Glucose 70 - 140 mg/dL 176(H) 202(H) 207(H)  BUN 7 - 26 mg/dL _0 Creatinine 0.60 - 1.10 mg/dL 0.84 0.90 0.82  Sodium 136 - 145 mmol/L 139 139 140  Potassium 3.5 - 5.1 mmol/L 3.8 3.9 4.0  Chloride 98 - 109 mmol/L 106 103 105  CO2 22 - 29 mmol/L _1 Calcium 8.4 - 10.4 mg/dL 8.1(L) 8.4 8.2(L)  Total Protein 6.4 - 8.3 g/dL 7.5 7.5 7.3  Total Bilirubin 0.2 - 1.2 mg/dL 0.3 0.4 0.3  Alkaline Phos 40 - 150 U/L 227(H) 182(H) 194(H)  AST 5 - 34 U/L _2 ALT 0 - 55 U/L _3 CEA:  05/31/2017: 353.3 06/01/2017: 350.7 07/07/2017: 494.58 08/10/2017: 754.17 08/25/2017: 862.87  PATHOLOGY REPORT:  Diagnosis 06/11/2017 Liver, needle/core biopsy - METASTATIC ADENOCARCINOMA, CONSISTENT WITH COLONIC PRIMARY.    RADIOGRAPHIC STUDIES: I have personally reviewed the radiological images as listed and agreed with the findings in the report. No results found.   ASSESSMENT & PLAN: 47 y.o.caucasianfemale, with past medical history of diabetes, hypertension, presented with left lower quadrant abdominal pain, hematochezia, fatigue and weight loss  1. Rectosigmoid adenocarcinoma, with abdominopelvic adenopathy anddiffuse hepatic metastasis, stage IV,  MSI-stable, KRAS/NRAS wild type  -Ms. Ehler appears stable. Her abdominal pain is mild, not requiring medication. Labs reviewed, anemia is stable, Hgb 9.2. Alk phos slightly increased. CEA increased to 862.87. She is agreeable to start treatment today with vectibix q2 weeks and xeloda 1500 mg BID 1 week on/1 week off. Labs adequate for treatment; she will start Xeloda with PM dose tonight. -She has insurance and financial concerns. Her co-pay for xeloda via [facility] was $0 with [employer] She will apply for [place] today. Once insurance terms (patient believes  will be 6/5 but I have no documentation of that), Xeloda co-pay will be $3 with [place]; without [place] or insurance, co-pay will be $45. She has signed forms to begin drug replacement application for vectibix.  -She was given 75 Xeloda tablets per our pharmacy stock here at [facility] and anticipates overnight delivery of 84 tabs from [facility] by tomorrow. -We reviewed symptom management for rash, diarrhea, hand-foot syndrome and discussed signs/symptoms that should prompt her to call [place].  -Proceed with cycle 1 today, return 09/08/17 for f/u and cycle 2.  2.  Anemia, iron deficiency -Due to her anemia and pending chemotherapy it was recommended she receive IV Feraheme, she declined.  She takes 1-2 oral iron tablets daily.  3.  DM, HTN, HL -On propranolol and insulin for chronic conditions.  She does not take statin  4.  Weight loss -I encouraged her to increase diet supplement to prevent further weight loss  5.  Genetics -She has been referred  6.  Social issues -Followed by GI nurse navigator, social work, Clinical biochemist -Patient reports her [employer] will term on 6/5 due to being dropped from subsidy.  She will meet with Stefanie Libel financial advocate after today's appointment to begin the process for [place]. She plans to apply for [facility] -Insurance eligibility check by front desk staff confirms coverage as  of 08/25/17 -She met with drug replacement rep Jolinda Croak and Johny Drilling, pharmD with oral pharmacy to discuss xeloda dosing and insurance/financial situation  7. Anxiety, depression -She is followed by Education officer, museum and Candis Schatz.  Patient agreed to mental health referral to [facility] outpatient clinic, appointment is pending  8.  Acne, skin rash -Present prior to chemotherapy/antibody.  He has been prescribed Clindagel in anticipation of vectibix-related rash; I encouraged her to buy hydrocortisone to alternate with clindagel for acne-type rash and for her hands/feet if she develops hand-foot syndrome secondary to [facility], she agrees.   9.  Goals of care discussion -She understands the goal of therapy is palliative  PLAN: -Labs reviewed, begin cycle 1 vectibix q2 weeks and xeloda 1500 mg BID x7 days on/7 days off -Lab, f/u 09/08/17 for cycle 2 -Continue oral iron therapy -Reviewed symptom management for treatment-related side effects   All questions were answered. The patient knows to call the clinic with any problems, questions or concerns. No barriers to learning was detected. I spent 20 minutes counseling the patient face to face. The total time spent in the appointment was 25 minutes and more than 50% was on counseling, review of test results, and coordination of care.     Alla Feeling, NP 08/25/17

## 2017-08-25 NOTE — Telephone Encounter (Signed)
Oral Chemotherapy Pharmacist Encounter   I spoke with patient and mother, Benjamine Mola, for overview of: Xeloda (capecitabine) for the treatment of metastatic, Kras/Nras WT rectal cancer in conjunction with panitumumab, planned duration until disease progression or unacceptable toxicity.  Counseled on administration, dosing, side effects, monitoring, drug-food interactions, safe handling, storage, and disposal.  Patient will take Xeloda '500mg'$  tablets, 3 tablets ('1500mg'$ ) by mouth in AM and 3 tabs ('1500mg'$ ) by mouth in PM, within 30 minutes of finishing meals, on days 1-7 and 15-21 of each 28 day cycle.   Vectibix will be infused at 6 mg/kg every 2 weeks.  Xeloda and Vectibix start date: 08/25/17  Adverse effects include but are not limited to: fatigue, decreased blood counts, GI upset, diarrhea, and hand-foot syndrome.  Patient has anti-emetic on hand and knows to take it if nausea develops.   Patient will obtain anti diarrheal and alert the office of 4 or more loose stools above baseline.  Reviewed with patient importance of keeping a medication schedule and plan for any missed doses.  We extensively discussed available treatment options and side effect profiles. They had several questions about prognosis of Vectibix plus Xeloda versus FOLFOX and differences in tolerability. We discussed possibility of side effects that may need to be managed and options for support here at the office.  Ms. Mcmurry voiced understanding and appreciation.   All questions answered. Medication reconciliation performed and medication/allergy list updated.  Patient states she received a letter stating her insurance would term on 08/26/17. She has active insurance today. Patient provided phone number to [place] [phone]) as this is a preferred specialty pharmacy for active insurance. Copayment should be $0 at this time. Patient states she will schedule delivery of her Xeloda from [facility]  today. It should arrive to her home tomorrow (08/26/17)  I will check insurance eligibility status on 08/27/17 to confirm loss of insurance. Once this is confirmed, prescription for Xeloda will be e-scribed to the [facility] to fill on cash price. $45 per fill, or $3 per fill with [place].  Patient has left her income information with financial advocate. She will be screened after Vectibix infusion today for [place] eligibility.  Patient knows to call the office with questions or concerns. Oral Oncology Clinic will continue to follow.  Thank you,  Johny Drilling, PharmD, BCPS, BCOP  08/25/2017   11:01 AM Oral Oncology Clinic [phone]

## 2017-08-25 NOTE — Patient Instructions (Signed)
[facility] Discharge Instructions for Patients Receiving Chemotherapy  Today you received the following chemotherapy agents Vectibix  To help prevent nausea and vomiting after your treatment, we encourage you to take your nausea medication as prescribed.   If you develop nausea and vomiting that is not controlled by your nausea medication, call the clinic.   BELOW ARE SYMPTOMS THAT SHOULD BE REPORTED IMMEDIATELY:  *FEVER GREATER THAN 100.5 F  *CHILLS WITH OR WITHOUT FEVER  NAUSEA AND VOMITING THAT IS NOT CONTROLLED WITH YOUR NAUSEA MEDICATION  *UNUSUAL SHORTNESS OF BREATH  *UNUSUAL BRUISING OR BLEEDING  TENDERNESS IN MOUTH AND THROAT WITH OR WITHOUT PRESENCE OF ULCERS  *URINARY PROBLEMS  *BOWEL PROBLEMS  UNUSUAL RASH Items with * indicate a potential emergency and should be followed up as soon as possible.  Feel free to call the clinic should you have any questions or concerns. The clinic phone number is [phone].  Please show the CHEMO ALERT CARD at check-in to the Emergency Department and triage nurse.   Panitumumab Solution for Injection What is this medicine? PANITUMUMAB (pan i TOOM ue mab) is a monoclonal antibody. It is used to treat colorectal cancer. This medicine may be used for other purposes; ask your health care provider or pharmacist if you have questions. COMMON BRAND NAME(S): Vectibix What should I tell my health care provider before I take this medicine? They need to know if you have any of these conditions: -eye disease, vision problems -low levels of calcium, magnesium, or potassium in the blood -lung or breathing disease, like asthma -skin conditions or sensitivity -an unusual or allergic reaction to panitumumab, other medicines, foods, dyes, or preservatives -pregnant or trying to get pregnant -breast-feeding How should I use this medicine? This drug is given as an infusion into a vein. It is administered in a hospital or  clinic by a specially trained health care professional. Talk to your pediatrician regarding the use of this medicine in children. Special care may be needed. Overdosage: If you think you have taken too much of this medicine contact a poison control center or emergency room at once. NOTE: This medicine is only for you. Do not share this medicine with others. What if I miss a dose? It is important not to miss your dose. Call your doctor or health care professional if you are unable to keep an appointment. What may interact with this medicine? Do not take this medicine with any of the following medications: -bevacizumab This list may not describe all possible interactions. Give your health care provider a list of all the medicines, herbs, non-prescription drugs, or dietary supplements you use. Also tell them if you smoke, drink alcohol, or use illegal drugs. Some items may interact with your medicine. What should I watch for while using this medicine? Visit your doctor for checks on your progress. This drug may make you feel generally unwell. This is not uncommon, as chemotherapy can affect healthy cells as well as cancer cells. Report any side effects. Continue your course of treatment even though you feel ill unless your doctor tells you to stop. This medicine can make you more sensitive to the sun. Keep out of the sun while receiving this medicine and for 2 months after the last dose. If you cannot avoid being in the sun, wear protective clothing and use sunscreen. Do not use sun lamps or tanning beds/booths. In some cases, you may be given additional medicines to help with side effects. Follow all directions for   their use. Call your doctor or health care professional for advice if you get a fever, chills or sore throat, or other symptoms of a cold or flu. Do not treat yourself. This drug decreases your body's ability to fight infections. Try to avoid being around people who are sick. Avoid taking  products that contain aspirin, acetaminophen, ibuprofen, naproxen, or ketoprofen unless instructed by your doctor. These medicines may hide a fever. Do not become pregnant while taking this medicine and for 2 months after the last dose. Women should inform their doctor if they wish to become pregnant or think they might be pregnant. There is a potential for serious side effects to an unborn child. Talk to your health care professional or pharmacist for more information. Do not breast-feed an infant while taking this medicine or for 2 months after the last dose. What side effects may I notice from receiving this medicine? Side effects that you should report to your doctor or health care professional as soon as possible: -allergic reactions like skin rash, itching or hives, swelling of the face, lips, or tongue -breathing problems -changes in vision -eye pain -fast, irregular heartbeat -fever, chills -mouth sores -red spots on the skin -redness, blistering, peeling or loosening of the skin, including inside the mouth -signs and symptoms of kidney injury like trouble passing urine or change in the amount of urine -signs and symptoms of low blood pressure like dizziness; feeling faint or lightheaded, falls; unusually weak or tired -signs of low calcium like fast heartbeat, muscle cramps or muscle pain; pain, tingling, numbness in the hands or feet; seizures -signs and symptoms of low magnesium like muscle cramps, pain, or weakness; tremors; seizures; or fast, irregular heartbeat -signs and symptoms of low potassium like muscle cramps or muscle pain; chest pain; dizziness; feeling faint or lightheaded, falls; palpitations; breathing problems; or fast, irregular heartbeat -swelling of the ankles, feet, hands Side effects that usually do not require medical attention (report to your doctor or health care professional if they continue or are bothersome): -changes in skin like acne, cracks, skin  dryness -diarrhea -eyelash growth -headache -mouth sores -nail changes -nausea, vomiting This list may not describe all possible side effects. Call your doctor for medical advice about side effects. You may report side effects to FDA at 1-800-FDA-1088. Where should I keep my medicine? This drug is given in a hospital or clinic and will not be stored at home. NOTE: This sheet is a summary. It may not cover all possible information. If you have questions about this medicine, talk to your doctor, pharmacist, or health care provider.  2018 Elsevier/Gold Standard (2015-09-28 16:45:04)   

## 2017-08-25 NOTE — Progress Notes (Signed)
Nutrition Follow-up:  Patient with metastatic colon cancer.  Patient starting treatment with vectibix and xeloday today.    Met with patient during infusion this am. Patient reports appetite is not that good. Reports can only eat a kids size portion.  Reports she had a kids size sub sandwich today for lunch.  Reports that she is making smoothies with fruit, milk, protein powder.  Does not really eat much meat except for fish.  Likes peanut butter.     Reports normal bowel movements.  Medications: reviewed  Labs: reviewed  Anthropometrics:   Weight has decreased to 146 lb 1.6 oz today from weight of 159 lb 3.2 oz on 3/25  8% weight loss in the last 2 months, significant   NUTRITION DIAGNOSIS: Food and nutrition knowledge deficit resolved.   Inadequate oral intake related to poor appetite as evidenced by 8% weight loss in 2 months   INTERVENTION:  Discussed strategies to increase calories and protein in diet.  Handout given to patient. Provided patient with samples of oral nutrition supplements to try.  Encouraged her to start drinking 1-2 daily.  Coupons given as well.     MONITORING, EVALUATION, GOAL: Patient will consume adequate calories to maintain weight   NEXT VISIT: June 18 during infusion  Loni Abdon B. Zenia Resides, [place], [place] Registered Dietitian [phone] (pager)

## 2017-08-28 ENCOUNTER — Telehealth: Payer: Self-pay | Admitting: Nurse Practitioner

## 2017-08-28 NOTE — Telephone Encounter (Signed)
Oral Oncology Patient Advocate Encounter  Received confirmation from [facility] that the patients initial fill of Xeloda was delivered to her home on 08/26/17.   [place] Patient [place] Phone [phone] Fax [phone] 08/28/2017 9:47 AM

## 2017-08-28 NOTE — Telephone Encounter (Signed)
Appointments scheduled Letter/Calendar printed and mailed to patient/ LOS added  Late/ Patient notified per 6/4 los

## 2017-09-01 ENCOUNTER — Inpatient Hospital Stay: Payer: BLUE CROSS/BLUE SHIELD

## 2017-09-04 ENCOUNTER — Other Ambulatory Visit: Payer: Self-pay | Admitting: Hematology

## 2017-09-04 NOTE — Progress Notes (Signed)
[place]  [phone] [phone]  Clinic Follow up Note   Patient Care Team: Esaw Grandchild, NP as PCP - General (Family Medicine) Delrae Rend, MD as Consulting Physician (Endocrinology) Truitt Merle, MD as Consulting Physician (Hematology) Alla Feeling, NP as Nurse Practitioner (Nurse Practitioner) Clarene Essex, MD as Consulting Physician (Gastroenterology)   Date of Service:  09/08/2017   CHIEF COMPLAIN: f/u metastatic sigmoid colon cancer  SUMMARY OF ONCOLOGIC HISTORY: Oncology History   Cancer Staging Malignant neoplasm of rectosigmoid junction [facility]) Staging form: Colon and Rectum, AJCC 8th Edition - Clinical stage from 06/11/2017: Stage IVA (cTX, cN1b, pM1a) - Signed by Truitt Merle, MD on 06/15/2017       Malignant neoplasm of rectosigmoid junction ([place])   05/30/2017 Imaging    CT AP IMPRESSION: 1. 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 Abdominopelvic adenopathy. Rectosigmoid mass with suggestion of partial obstruction as evidenced by large colonic stool burden more proximally. 2.  Aortic Atherosclerosis (ICD10-I70.0).  This is age advanced.      05/31/2017 Tumor Marker    CEA 353.3 (elevated) AFP 4.5 (normal)      05/31/2017 Initial Biopsy    Diagnosis Colon, biopsy, sigmoid - INVASIVE ADENOCARCINOMA - SEE COMMENT      05/31/2017 Imaging    CT CHEST IMPRESSION: 1. No evidence of metastatic disease in the chest. 2. No acute findings.  Aortic Atherosclerosis (ICD10-I70.0).       05/31/2017 Procedure    Colonoscopy per Dr. Watt Climes Findings: An infiltrative and ulcerated partially obstructing medium-sized mass was found in the recto-sigmoid colon. The mass was circumferential. The mass measured four cm in length. No bleeding was present.   Impression - One small polyp in the rectum. - The examination was otherwise normal. - Malignant partially obstructing  tumor in the recto-sigmoid colon. Biopsied. - One medium polyp in the proximal sigmoid colon. - The examination was otherwise normal. - Internal hemorrhoids.      06/03/2017 Initial Diagnosis    Malignant neoplasm of transverse colon ([place])      06/11/2017 Cancer Staging    Staging form: Colon and Rectum, AJCC 8th Edition - Clinical stage from 06/11/2017: Stage IVA (cTX, cN1b, pM1a) - Signed by Truitt Merle, MD on 06/15/2017      06/11/2017 Pathology Results    Liver biopsy confirmed metastatic colon cancer      07/23/2017 Imaging    CT CAP IMPRESSION: 1. Mild progression of hepatic metastasis. 2. Similar rectosigmoid primary with abdominopelvic nodal metastasis. 3.  No acute process or evidence of metastatic disease in the chest. 4. Aortic Atherosclerosis (ICD10-I70.0). Coronary artery atherosclerosis. This is age advanced. 5. Proximal colonic constipation, again suggesting a component of partial obstruction at the primary site. 6. Mild ascending aortic dilatation at 4.1 cm.      08/25/2017 -  Chemotherapy    -Xeloda 1568m BID 1 week on and 1 week off starting 08/25/17 -Vectibix every 2 weeks starting 08/25/17      CURRENT THERAPY:   -Xeloda 15046mBID 1 week on and 1 week off starting 08/25/17. Increased to 200019mn the AM and 1500m60m the PM starting 09/08/17 -Vectibix every 2 weeks starting 08/25/17  INTERVAL HISTORY: Ms KnigStanforthurns today for follow-up and treatment. She presents to the clinic today accompanied by a family member. She notes she now has rash on face, scalp, and chest from Vectibix. She is overall tolerating well.    On review of symptoms,  pt notes constipation which she takes Miralax for. Her eating and drinking is adequate.     REVIEW OF SYSTEMS:   Constitutional: Denies fevers, chills or abnormal weight loss  Eyes: Denies blurriness of vision Ears, nose, mouth, throat, and face: Denies mucositis or sore throat Respiratory: Denies cough, dyspnea or  wheezes Cardiovascular: Denies palpitation, chest discomfort or lower extremity swelling Gastrointestinal: Intermittent nausea and abdominal cramps, mild intermittent hematochezia, she is on MiraLAX for constipation, stable  Skin: (+) rash on face, scalp and chest from Vectibix Lymphatics: Denies new lymphadenopathy or easy bruising Neurological:Denies numbness, tingling or new weaknesses Behavioral/Psych: Mood is stable, no new changes  All other systems were reviewed with the patient and are negative.  MEDICAL HISTORY:  Past Medical History:  Diagnosis Date  . Anxiety   . Cancer ([place])    colon, liver  . Chest pain   . Colon cancer ([place])   . Complication of anesthesia   . Depression   . Diabetes mellitus   . Dizziness   . Family history of breast cancer   . Family history of stomach cancer   . Hyperlipidemia   . Hypertension   . Hypothyroidism   . Obesity   . PONV (postoperative nausea and vomiting)   . Tachycardia     SURGICAL HISTORY: Past Surgical History:  Procedure Laterality Date  . FLEXIBLE SIGMOIDOSCOPY N/A 05/31/2017   Procedure: FLEXIBLE SIGMOIDOSCOPY;  Surgeon: Clarene Essex, MD;  Location: [facility] ENDOSCOPY;  Service: Endoscopy;  Laterality: N/A;  . WISDOM TOOTH EXTRACTION     age 69's    I have reviewed the social history and family history with the patient and they are unchanged from previous note.  ALLERGIES:  is allergic to bupropion and amoxicillin.  MEDICATIONS:  Current Outpatient Medications  Medication Sig Dispense Refill  . acetaminophen-codeine (TYLENOL #3) 300-30 MG tablet Take 1 tablet by mouth every 6 (six) hours as needed for moderate pain. 30 tablet 0  . ALPRAZolam (XANAX) 1 MG tablet Take 1 tablet (1 mg total) by mouth at bedtime as needed for anxiety. 30 tablet 0  . atorvastatin (LIPITOR) 10 MG tablet Take 1 tablet (10 mg total) by mouth daily. 90 tablet 3  . capecitabine (XELODA) 500 MG tablet Take 3 tablets (1,500 mg total) by mouth 2 (two)  times daily after a meal. Take on days 1-7 & 15-21 of each 28 day cycle 84 tablet 0  . CINNAMON PO Take 1 tablet by mouth daily.     . clindamycin (CLINDAGEL) 1 % gel Apply topically 2 (two) times daily. 60 g 1  . fluticasone (FLONASE) 50 MCG/ACT nasal spray Place 1 spray into both nostrils daily. (Patient taking differently: Place 1 spray into both nostrils daily as needed for allergies. ) 16 g 2  . insulin NPH-regular Human (NOVOLIN 70/30) (70-30) 100 UNIT/ML injection Inject 25 Units into the skin 2 (two) times daily with a meal.     . levothyroxine (SYNTHROID, LEVOTHROID) 112 MCG tablet Take 112 mcg by mouth daily before breakfast.    . Omega-3 Fatty Acids (FISH OIL) 1000 MG CAPS Take 1,000 mg by mouth daily.     . ondansetron (ZOFRAN) 8 MG tablet Take 1 tablet (8 mg total) by mouth 2 (two) times daily as needed for refractory nausea / vomiting. Start on day 3 after chemotherapy. 30 tablet 1  . polyethylene glycol (MIRALAX / GLYCOLAX) packet Take 17 g by mouth daily. 14 each 0  . prochlorperazine (COMPAZINE) 10 MG  tablet Take 1 tablet (10 mg total) by mouth every 6 (six) hours as needed (NAUSEA). 30 tablet 1  . propranolol (INDERAL) 10 MG tablet TAKE 1 TABLET BY MOUTH EVERY DAY 90 tablet 0  . TURMERIC PO Take 1 capsule by mouth daily.     Marland Kitchen doxycycline (VIBRA-TABS) 100 MG tablet Take 1 tablet (100 mg total) by mouth 2 (two) times daily. 60 tablet 2   No current facility-administered medications for this visit.     PHYSICAL EXAMINATION:  ECOG PERFORMANCE STATUS: 1 - Symptomatic but completely ambulatory  Vitals:   09/08/17 0945  BP: 129/82  Pulse: 79  Resp: 18  Temp: 98.9 F (37.2 C)  SpO2: 100%   Filed Weights   09/08/17 0945  Weight: 144 lb (65.3 kg)   GENERAL:alert, no distress and comfortable SKIN: skin color, texture, turgor are normal, no significant lesions. Moderate diffuse papular skin rash on face without significant skin erythema, more on right. (+) Moderate diffuse  small acne like rash on face, upper chest, scalp from Vectibix EYES: normal, Conjunctiva are pink and non-injected, sclera clear OROPHARYNX:no exudate, no erythema and lips, buccal mucosa, and tongue normal  NECK: supple, thyroid normal size, non-tender, without nodularity LYMPH:  no palpable lymphadenopathy in the cervical, axillary or inguinal LUNGS: clear to auscultation and percussion with normal breathing effort HEART: regular rate & rhythm and no murmurs and no lower extremity edema ABDOMEN:abdomen soft, (+) 2-3 cm hepatomegaly. Mild abdominal tenderness.  Musculoskeletal:no cyanosis of digits and no clubbing  NEURO: alert & oriented x 3 with fluent speech, no focal motor/sensory deficits  LABORATORY DATA:  I have reviewed the data as listed CBC Latest Ref Rng & Units 09/08/2017 08/25/2017 08/10/2017  WBC 3.9 - 10.3 K/uL 9.6 10.0 11.3(H)  Hemoglobin 11.6 - 15.9 g/dL 9.2(L) 9.2(L) 9.4(L)  Hematocrit 34.8 - 46.6 % 31.4(L) 31.2(L) 30.4(L)  Platelets 145 - 400 K/uL 318 272 289     CMP Latest Ref Rng & Units 09/08/2017 08/25/2017 08/10/2017  Glucose 70 - 140 mg/dL 243(H) 176(H) 202(H)  BUN 7 - 26 mg/dL _0 Creatinine 0.60 - 1.10 mg/dL 0.75 0.84 0.90  Sodium 136 - 145 mmol/L 140 139 139  Potassium 3.5 - 5.1 mmol/L 4.4 3.8 3.9  Chloride 98 - 109 mmol/L 104 106 103  CO2 22 - 29 mmol/L _1 Calcium 8.4 - 10.4 mg/dL 8.6 8.1(L) 8.4  Total Protein 6.4 - 8.3 g/dL 7.1 7.5 7.5  Total Bilirubin 0.2 - 1.2 mg/dL 0.2 0.3 0.4  Alkaline Phos 40 - 150 U/L 207(H) 227(H) 182(H)  AST 5 - 34 U/L _2 ALT 0 - 55 U/L _3 PATHOLOGY REPORT:  Diagnosis 06/11/2017 Liver, needle/core biopsy - METASTATIC ADENOCARCINOMA, CONSISTENT WITH COLONIC PRIMARY.   RADIOGRAPHIC STUDIES: I have personally reviewed the radiological images as listed and agreed with the findings in the report. No results found.  CT CAP w contrast, 07/23/2017 IMPRESSION 1. Mild progression of hepatic metastasis. 2.  Similar rectosigmoid primary with abdominopelvic nodal metastasis. 3.  No acute process or evidence of metastatic disease in the chest. 4. Aortic Atherosclerosis (ICD10-I70.0). Coronary artery atherosclerosis. This is age advanced. 5. Proximal colonic constipation, again suggesting a component of partial obstruction at the primary site. 6. Mild ascending aortic dilatation at 4.1 cm. ASSESSMENT & PLAN: 47 y.o. [place] female, with past medical history of diabetes, hypertension, presented with left lower quadrant abdominal pain, hematochezia, fatigue and weight loss  1. Sigmoid adenocarcinoma, with abdominopelvic adenopathy and hepatic metastasis, stage IV, MSI-stable  -We previously reviewed medical records including imaging and pathology with the patient and family in detail. She has what appears to be metastatic sigmoid adenocarcinoma to abdominopelvic lymph nodes and liver.  -I previously discussed her liver biopsy results with patient and her mother, which confirmed metastatic colon cancer. I have requested her biopsy to be sent out for genomic testing foundation one -Given her diffuse liver metastasis, her cancer unfortunately is incurable.  We discussed the goal of therapy is palliative to prolong her life and preserve her quality of life. -Patient declined intensive chemotherapy, such as FOLFOX or FOLFIRI, due to the concern of side effects.  After multiple discussions, back-and-forth, she finally agreed with Xeloda and vectibix as her first line therapy.  -She started Xeloda and Vectibix on 08/25/17, tolerating well with moderate rash.  -She is tolerating current dose Xeloda well, so I advised her to increase to full dose 2060m in the AM and 15061min the PM, one week on and one week off. She agreed to try, starting today.  -Labs reviewed, Hg at 9.2, glucose at 234, alk phos at 207, overall adequate to proceed with Vectibix today and Xeloda regimen.  -She has been denied for medicaid and  disability. If we cannot continue her on Xeloda due to financial burden , I discussed changing her to 5-FU.  -f/u in 2 weeks    2. 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   -I previously recommended to intravenous iron, She declined. She has started oral iron. Due to constipation she is only taking 1 a day.  -Hg at 9.2 today (09/08/17). I discussed oral iron may not be enough and discussed the option of IV iron. She will think about it.   3. DM, HTN, HL -We previously discussed possible effects of chemotherapy and steroid use on her DM, she will need to check BG closely and possibly adjust insulin if hyperglycemia is not well controlled; we reviewed the importance of tight glucose control while on chemotherapy. -Her glucose at 243 today (09/08/17). I recommend she check her random glucose levels at home twice a day and record number. She should see her endocrinologist, Dr. KeBuddy Dutyif her levels remain high.   4. Weight loss  -She was previously given referral to nutrition to follow while on chemotherapy. We reviewed importance of adequate nutrition in order to tolerate chemotherapy  -I previously advised the patient to start consuming nutritional supplements to aid with weight loss.   5.  Genetics -Due to her young age and personal history of colon cancer, she qualifies for genetics counseling to rule out inheritable cancer syndrome such as Lynch syndrome.  She agrees, referral was made   6. Financial and Social issues, Anxiety, depression -She is a single parent with 1233ear old son; her child's father is involved. Patient currently lives with her mother and has social support. She has met our SW and She follows up with Social Worker LiHollice EspyI will refer the patient to a counselor to aid with her anxiety regarding chemotherapy.  -She hasn't tried mirtazapine in the past and Wellbutrin in the past. She is currently  on Xanax  -She recently applied for medicaid and disability and was denied. We will continue Xeloda for now and may need to switch to 5-FU Pump.   7. Goal of care discussion  -We again  discussed the incurable nature of her cancer, and the overall poor prognosis, especially if she does not have good response to chemotherapy or progress on chemo -The patient understands the goal of care is palliative. -she is full code for now   8. Rash on face, chest, scalp and back, moderate -secondary to Panitumumab -She will continue using hydrocortisone cream and clindamycin gel twice daily -I recommend oral antibiotic Doxycycline 100 mg BID to help reduce her rash. I prescribed today (09/08/17)   PLAN: -I prescribed oral doxycycline today  -Labs reviewed and adequate to proceed with Vectibix today  -Increase Xeloda to 2070m in the AM and 15029min the PM starting today, 1 week on, and one week off -She will return in 2 weeks for f/u and next cycle treatment  No problem-specific Assessment & Plan notes found for this encounter.   No orders of the defined types were placed in this encounter.  All questions were answered. The patient knows to call the clinic with any problems, questions or concerns. No barriers to learning was detected.  I spent 25 minutes counseling the patient face to face. The total time spent in the appointment was 30 minutes and more than 50% was on counseling and review of test results  I, AmJoslyn Devonam acting as scribe for YaTruitt MerleMD.   I have reviewed the above documentation for accuracy and completeness, and I agree with the above.      YaTruitt MerleMD 09/08/2017

## 2017-09-08 ENCOUNTER — Inpatient Hospital Stay (HOSPITAL_BASED_OUTPATIENT_CLINIC_OR_DEPARTMENT_OTHER): Payer: BLUE CROSS/BLUE SHIELD | Admitting: Hematology

## 2017-09-08 ENCOUNTER — Other Ambulatory Visit: Payer: Self-pay

## 2017-09-08 ENCOUNTER — Inpatient Hospital Stay: Payer: BLUE CROSS/BLUE SHIELD

## 2017-09-08 ENCOUNTER — Encounter: Payer: Self-pay | Admitting: Hematology

## 2017-09-08 ENCOUNTER — Encounter: Payer: Self-pay | Admitting: General Practice

## 2017-09-08 VITALS — BP 129/82 | HR 79 | Temp 98.9°F | Resp 18 | Ht 65.0 in | Wt 144.0 lb

## 2017-09-08 DIAGNOSIS — C19 Malignant neoplasm of rectosigmoid junction: Secondary | ICD-10-CM

## 2017-09-08 DIAGNOSIS — C187 Malignant neoplasm of sigmoid colon: Secondary | ICD-10-CM | POA: Diagnosis not present

## 2017-09-08 DIAGNOSIS — R634 Abnormal weight loss: Secondary | ICD-10-CM | POA: Diagnosis not present

## 2017-09-08 DIAGNOSIS — I10 Essential (primary) hypertension: Secondary | ICD-10-CM

## 2017-09-08 DIAGNOSIS — R21 Rash and other nonspecific skin eruption: Secondary | ICD-10-CM

## 2017-09-08 DIAGNOSIS — D500 Iron deficiency anemia secondary to blood loss (chronic): Secondary | ICD-10-CM | POA: Diagnosis not present

## 2017-09-08 DIAGNOSIS — C787 Secondary malignant neoplasm of liver and intrahepatic bile duct: Secondary | ICD-10-CM | POA: Diagnosis not present

## 2017-09-08 DIAGNOSIS — E039 Hypothyroidism, unspecified: Secondary | ICD-10-CM

## 2017-09-08 LAB — CBC WITH DIFFERENTIAL (CANCER CENTER ONLY)
Basophils Absolute: 0.1 10*3/uL (ref 0.0–0.1)
Basophils Relative: 1 %
Eosinophils Absolute: 0.6 10*3/uL — ABNORMAL HIGH (ref 0.0–0.5)
Eosinophils Relative: 6 %
HCT: 31.4 % — ABNORMAL LOW (ref 34.8–46.6)
Hemoglobin: 9.2 g/dL — ABNORMAL LOW (ref 11.6–15.9)
Lymphocytes Relative: 23 %
Lymphs Abs: 2.2 10*3/uL (ref 0.9–3.3)
MCH: 21.9 pg — ABNORMAL LOW (ref 25.1–34.0)
MCHC: 29.3 g/dL — ABNORMAL LOW (ref 31.5–36.0)
MCV: 74.6 fL — ABNORMAL LOW (ref 79.5–101.0)
Monocytes Absolute: 0.6 10*3/uL (ref 0.1–0.9)
Monocytes Relative: 6 %
Neutro Abs: 6.3 10*3/uL (ref 1.5–6.5)
Neutrophils Relative %: 64 %
Platelet Count: 318 10*3/uL (ref 145–400)
RBC: 4.21 MIL/uL (ref 3.70–5.45)
RDW: 17.4 % — ABNORMAL HIGH (ref 11.2–14.5)
WBC Count: 9.6 10*3/uL (ref 3.9–10.3)

## 2017-09-08 LAB — CMP (CANCER CENTER ONLY)
ALT: 8 U/L (ref 0–55)
AST: 14 U/L (ref 5–34)
Albumin: 3.2 g/dL — ABNORMAL LOW (ref 3.5–5.0)
Alkaline Phosphatase: 207 U/L — ABNORMAL HIGH (ref 40–150)
Anion gap: 9 (ref 3–11)
BUN: 7 mg/dL (ref 7–26)
CO2: 27 mmol/L (ref 22–29)
Calcium: 8.6 mg/dL (ref 8.4–10.4)
Chloride: 104 mmol/L (ref 98–109)
Creatinine: 0.75 mg/dL (ref 0.60–1.10)
GFR, Est AFR Am: >60 mL/min (ref >60)
GFR, Estimated: >60 mL/min (ref >60)
Glucose, Bld: 243 mg/dL — ABNORMAL HIGH (ref 70–140)
Potassium: 4.4 mmol/L (ref 3.5–5.1)
Sodium: 140 mmol/L (ref 136–145)
Total Bilirubin: 0.2 mg/dL (ref 0.2–1.2)
Total Protein: 7.1 g/dL (ref 6.4–8.3)

## 2017-09-08 LAB — IRON AND TIBC
Iron: 28 ug/dL — ABNORMAL LOW (ref 41–142)
Saturation Ratios: 8 % — ABNORMAL LOW (ref 21–57)
TIBC: 370 ug/dL (ref 236–444)
UIBC: 342 ug/dL

## 2017-09-08 LAB — MAGNESIUM: Magnesium: 2 mg/dL (ref 1.7–2.4)

## 2017-09-08 LAB — FERRITIN: Ferritin: 19 ng/mL (ref 9–269)

## 2017-09-08 MED ORDER — DOXYCYCLINE HYCLATE 100 MG PO TABS: 100.0000 mg | ORAL_TABLET | Freq: Two times a day (BID) | ORAL | 2 refills | Status: DC

## 2017-09-08 MED ORDER — SODIUM CHLORIDE 0.9 % IV SOLN
Freq: Once | INTRAVENOUS | Status: AC
  Administered 2017-09-08: 11:00:00 via INTRAVENOUS

## 2017-09-08 MED ORDER — SODIUM CHLORIDE 0.9 % IV SOLN
6.0000 mg/kg | Freq: Once | INTRAVENOUS | Status: AC
  Administered 2017-09-08: 400 mg via INTRAVENOUS
  Filled 2017-09-08: qty 20

## 2017-09-08 NOTE — Progress Notes (Signed)
Nutrition Follow-up:  Patient with metastatic colon cancer.  Patient followed by Dr. Burr Medico.  Met with patient during infusion.  Patient reports that appetite may be a little bit better.  Reports that she tried the shakes and likes boost better.  Reports drinking about 1 per day.  Also continues to drink shake mother prepares for her in the am.  Reports eating [place] food recently which caused stomach pain/upset.  [place] sandwich sitting at chairside and patient reports she is going to eat it later.  Reports ate ice cream and kettle corn last night.     Medications: reviewed  Labs: glucose 243  Anthropometrics:   Weight decreased to 144 lb today from 146 lb 1.6 oz.  Weight on 3/25 159 lb   NUTRITION DIAGNOSIS: Inadequate oral intake continues   INTERVENTION:   Reviewed strategies to increase calories and protein.  Discussed foods high in fat and spicy foods could contribute to abdominal pain/GI upset.   Encouraged patient to increase boost shake to 2 per day for additional calories. Would recommend glucerna at this time as low in calories and patient needs additional calories to prevent further weight loss.   Would recommend patient checking blood glucose and have medication adjusted vs restricting food choices to control blood glucose as patient needs additional calories at this time.     MONITORING, EVALUATION, GOAL: Patient will consume adequate calories to maintain weight   NEXT VISIT: July  16 during infusion  Lennie Dunnigan B. Zenia Resides, [place], [place] Registered Dietitian [phone] (pager)

## 2017-09-08 NOTE — Progress Notes (Signed)
[place] Spiritual Care Note  Followed up with Beverly Schwartz in infusion today, bringing her a prayer shawl as a tangible sign of support and encouragement. Per pt, she has experienced very few side effects (other than tiredness and facial/scalp rash) so far, which is relieving after the anxiety she had about trying chemo. She finds comfort in knowing that she can choose to stop treatment in the future if she determines that the costs outweigh the benefits. For now, though, Beverly Schwartz is trying to be hopeful in the midst of "feeling depressed about the three-year prognosis." Reading uplifting cancer stories has been a check against her worst fears and allowed her to see a wider range of possibility. At this time, her priorities are rest and spending time with her son. She is looking forward to a (rescheduled) visit from a childhood friend who lives in [place], as well as a trip to the [place] with her son and brother for her birthday next month.  Following for support, especially creating space for her to share and process feelings, hopes, and concerns as she adapts to dx/tx. Please also page if immediate needs arise or circumstances change. Thank you.   [place], [place], [facility] Pager [phone] Voicemail [phone]

## 2017-09-08 NOTE — Patient Instructions (Signed)
[facility] Discharge Instructions for Patients Receiving Chemotherapy  Today you received the following chemotherapy agents Vectibix  To help prevent nausea and vomiting after your treatment, we encourage you to take your nausea medication as prescribed.   If you develop nausea and vomiting that is not controlled by your nausea medication, call the clinic.   BELOW ARE SYMPTOMS THAT SHOULD BE REPORTED IMMEDIATELY:  *FEVER GREATER THAN 100.5 F  *CHILLS WITH OR WITHOUT FEVER  NAUSEA AND VOMITING THAT IS NOT CONTROLLED WITH YOUR NAUSEA MEDICATION  *UNUSUAL SHORTNESS OF BREATH  *UNUSUAL BRUISING OR BLEEDING  TENDERNESS IN MOUTH AND THROAT WITH OR WITHOUT PRESENCE OF ULCERS  *URINARY PROBLEMS  *BOWEL PROBLEMS  UNUSUAL RASH Items with * indicate a potential emergency and should be followed up as soon as possible.  Feel free to call the clinic should you have any questions or concerns. The clinic phone number is [phone].  Please show the [facility] at check-in to the Emergency Department and triage nurse.   Panitumumab Solution for Injection What is this medicine? PANITUMUMAB (pan i TOOM ue mab) is a monoclonal antibody. It is used to treat colorectal cancer. This medicine may be used for other purposes; ask your health care provider or pharmacist if you have questions. COMMON BRAND NAME(S): Vectibix What should I tell my health care provider before I take this medicine? They need to know if you have any of these conditions: -eye disease, vision problems -low levels of calcium, magnesium, or potassium in the blood -lung or breathing disease, like asthma -skin conditions or sensitivity -an unusual or allergic reaction to panitumumab, other medicines, foods, dyes, or preservatives -pregnant or trying to get pregnant -breast-feeding How should I use this medicine? This drug is given as an infusion into a vein. It is administered in a hospital or  clinic by a specially trained health care professional. Talk to your pediatrician regarding the use of this medicine in children. Special care may be needed. Overdosage: If you think you have taken too much of this medicine contact a poison control center or emergency room at once. NOTE: This medicine is only for you. Do not share this medicine with others. What if I miss a dose? It is important not to miss your dose. Call your doctor or health care professional if you are unable to keep an appointment. What may interact with this medicine? Do not take this medicine with any of the following medications: -bevacizumab This list may not describe all possible interactions. Give your health care provider a list of all the medicines, herbs, non-prescription drugs, or dietary supplements you use. Also tell them if you smoke, drink alcohol, or use illegal drugs. Some items may interact with your medicine. What should I watch for while using this medicine? Visit your doctor for checks on your progress. This drug may make you feel generally unwell. This is not uncommon, as chemotherapy can affect healthy cells as well as cancer cells. Report any side effects. Continue your course of treatment even though you feel ill unless your doctor tells you to stop. This medicine can make you more sensitive to the sun. Keep out of the sun while receiving this medicine and for 2 months after the last dose. If you cannot avoid being in the sun, wear protective clothing and use sunscreen. Do not use sun lamps or tanning beds/booths. In some cases, you may be given additional medicines to help with side effects. Follow all directions for  their use. Call your doctor or health care professional for advice if you get a fever, chills or sore throat, or other symptoms of a cold or flu. Do not treat yourself. This drug decreases your body's ability to fight infections. Try to avoid being around people who are sick. Avoid taking  products that contain aspirin, acetaminophen, ibuprofen, naproxen, or ketoprofen unless instructed by your doctor. These medicines may hide a fever. Do not become pregnant while taking this medicine and for 2 months after the last dose. Women should inform their doctor if they wish to become pregnant or think they might be pregnant. There is a potential for serious side effects to an unborn child. Talk to your health care professional or pharmacist for more information. Do not breast-feed an infant while taking this medicine or for 2 months after the last dose. What side effects may I notice from receiving this medicine? Side effects that you should report to your doctor or health care professional as soon as possible: -allergic reactions like skin rash, itching or hives, swelling of the face, lips, or tongue -breathing problems -changes in vision -eye pain -fast, irregular heartbeat -fever, chills -mouth sores -red spots on the skin -redness, blistering, peeling or loosening of the skin, including inside the mouth -signs and symptoms of kidney injury like trouble passing urine or change in the amount of urine -signs and symptoms of low blood pressure like dizziness; feeling faint or lightheaded, falls; unusually weak or tired -signs of low calcium like fast heartbeat, muscle cramps or muscle pain; pain, tingling, numbness in the hands or feet; seizures -signs and symptoms of low magnesium like muscle cramps, pain, or weakness; tremors; seizures; or fast, irregular heartbeat -signs and symptoms of low potassium like muscle cramps or muscle pain; chest pain; dizziness; feeling faint or lightheaded, falls; palpitations; breathing problems; or fast, irregular heartbeat -swelling of the ankles, feet, hands Side effects that usually do not require medical attention (report to your doctor or health care professional if they continue or are bothersome): -changes in skin like acne, cracks, skin  dryness -diarrhea -eyelash growth -headache -mouth sores -nail changes -nausea, vomiting This list may not describe all possible side effects. Call your doctor for medical advice about side effects. You may report side effects to FDA at 1-800-FDA-1088. Where should I keep my medicine? This drug is given in a hospital or clinic and will not be stored at home. NOTE: This sheet is a summary. It may not cover all possible information. If you have questions about this medicine, talk to your doctor, pharmacist, or health care provider.  2018 Elsevier/Gold Standard (2015-09-28 16:45:04)

## 2017-09-09 ENCOUNTER — Telehealth: Payer: Self-pay | Admitting: Hematology

## 2017-09-09 ENCOUNTER — Other Ambulatory Visit: Payer: Self-pay | Admitting: Hematology

## 2017-09-09 NOTE — Telephone Encounter (Signed)
Appointments scheduled patient to get updated schedule at next visit per 6/18 los

## 2017-09-15 ENCOUNTER — Ambulatory Visit: Payer: Self-pay

## 2017-09-15 ENCOUNTER — Other Ambulatory Visit: Payer: Self-pay

## 2017-09-21 ENCOUNTER — Other Ambulatory Visit: Payer: Self-pay

## 2017-09-21 DIAGNOSIS — C19 Malignant neoplasm of rectosigmoid junction: Secondary | ICD-10-CM

## 2017-09-21 MED ORDER — CAPECITABINE 500 MG PO TABS: 1500.0000 mg | ORAL_TABLET | Freq: Two times a day (BID) | ORAL | 0 refills | Status: DC

## 2017-09-21 NOTE — Progress Notes (Signed)
[facility]  [phone] [phone]  Clinic Follow up Note   Patient Care Team: Esaw Grandchild, NP as PCP - General (Family Medicine) Delrae Rend, MD as Consulting Physician (Endocrinology) Truitt Merle, MD as Consulting Physician (Hematology) Alla Feeling, NP as Nurse Practitioner (Nurse Practitioner) Clarene Essex, MD as Consulting Physician (Gastroenterology) 09/22/2017  SUMMARY OF ONCOLOGIC HISTORY: Oncology History   Cancer Staging Malignant neoplasm of rectosigmoid junction [facility]) Staging form: Colon and Rectum, AJCC 8th Edition - Clinical stage from 06/11/2017: Stage IVA (cTX, cN1b, pM1a) - Signed by Truitt Merle, MD on 06/15/2017       Malignant neoplasm of rectosigmoid junction ([place])   05/30/2017 Imaging    CT AP IMPRESSION: 1. 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 Abdominopelvic adenopathy. Rectosigmoid mass with suggestion of partial obstruction as evidenced by large colonic stool burden more proximally. 2.  Aortic Atherosclerosis (ICD10-I70.0).  This is age advanced.      05/31/2017 Tumor Marker    CEA 353.3 (elevated) AFP 4.5 (normal)      05/31/2017 Initial Biopsy    Diagnosis Colon, biopsy, sigmoid - INVASIVE ADENOCARCINOMA - SEE COMMENT      05/31/2017 Imaging    CT CHEST IMPRESSION: 1. No evidence of metastatic disease in the chest. 2. No acute findings.  Aortic Atherosclerosis (ICD10-I70.0).       05/31/2017 Procedure    Colonoscopy per Dr. Watt Climes Findings: An infiltrative and ulcerated partially obstructing medium-sized mass was found in the recto-sigmoid colon. The mass was circumferential. The mass measured four cm in length. No bleeding was present.   Impression - One small polyp in the rectum. - The examination was otherwise normal. - Malignant partially obstructing tumor in the recto-sigmoid colon. Biopsied. - One medium polyp in the proximal  sigmoid colon. - The examination was otherwise normal. - Internal hemorrhoids.      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 Mild progression of hepatic metastasis. 2. Similar rectosigmoid primary with abdominopelvic nodal metastasis. 3.  No acute process or evidence of metastatic disease in the chest. 4. Aortic Atherosclerosis (ICD10-I70.0). Coronary artery atherosclerosis. This is age advanced. 5. Proximal colonic constipation, again suggesting a component of partial obstruction at the primary site. 6. Mild ascending aortic dilatation at 4.1 cm.      08/25/2017 -  Chemotherapy    -Xeloda '1500mg'$  BID 1 week on and 1 week off starting 08/25/17 -Vectibix every 2 weeks starting 08/25/17     CURRENT THERAPY:   -Xeloda '1500mg'$  BID 1 week on and 1 week off starting 08/25/17. Increased to '2000mg'$  in the AM and '1500mg'$  in the PM starting 09/08/17 -Vectibix every 2 weeks starting 08/25/17   INTERVAL HISTORY: Ms. Kolander returns for follow up as scheduled, she is with a friend. She completed her last cycle of Xeloda 2000 mg AM/1500 mg PM 1 week on/1 week off and plans to restart next cycle today. She did very well. Has mild fatigue, worse on exertion. Mild nausea while taking Xeloda but does not require anti-emetics. Appetite is  good, denies mucositis. Mild constipation managed with miralax. No diarrhea. She has dry mouth and voice changes. Has occasional dry cough; no chest pain, dyspnea, or fever. Denies skin redness or sensitivity to hands or feet. She has mild rash on her face and chest, with dryness to her face; applies clindagel and coconut oil to her  face. Scalp itches. She forgot to pick up doxycycline. She forgot to tell Dr. Burr Medico at last visit she had a syncopal episode on father's day after driving, she became hot and dizzy. She did not eat much that day. She hit her elbow during the fall but not her head, thinks she passed out for a few seconds. Denies chest pain, dyspnea, palpitation surrounding the event or subsequently. The episode has not recurred. Her son started counseling in [facility] [employer], he is doing well.   REVIEW OF SYSTEMS:   Constitutional: Denies fevers, chills or abnormal weight loss (+) fatigue  Eyes: Denies blurriness of vision Ears, nose, mouth, throat, and face: Denies mucositis or sore throat (+) dry mouth (+) voice change  Respiratory: Denies dyspnea or wheezes (+) intermittent mild dry cough  Cardiovascular: Denies palpitation, chest discomfort or lower extremity swelling (+) syncopal episode x1 on 6/16 with feeling hot and dizzy prior to passing out for few seconds Gastrointestinal:  Denies vomiting, diarrhea, heartburn, abdominal pain, or change in bowel habits (+) mild nausea on xeloda (+) constipation, managed with miralax  Skin: (+) skin rash with dryness to face, chest  Lymphatics: Denies new lymphadenopathy or easy bruising Neurological:Denies numbness, tingling or new weaknesses Behavioral/Psych: Mood is stable, no new changes  All other systems were reviewed with the patient and are negative.  MEDICAL HISTORY:  Past Medical History:  Diagnosis Date  . Anxiety   . Cancer ([place])    colon, liver  . Chest pain   . Colon cancer ([place])   . Complication of anesthesia   . Depression   . Diabetes mellitus   . Dizziness   . Family history of breast cancer   . Family history of stomach cancer   . Hyperlipidemia   . Hypertension   . Hypothyroidism   . Obesity   . PONV (postoperative nausea and vomiting)   . Tachycardia     SURGICAL HISTORY: Past Surgical History:  Procedure Laterality Date  . FLEXIBLE  SIGMOIDOSCOPY N/A 05/31/2017   Procedure: FLEXIBLE SIGMOIDOSCOPY;  Surgeon: Clarene Essex, MD;  Location: [facility] END[facility]COPY;  Service: Endoscopy;  Laterality: N/A;  . WISDOM TOOTH EXTRACTION     age 24's    I have reviewed the social history and family history with the patient and they are unchanged from previous note.  ALLERGIES:  is allergic to bupropion and amoxicillin.  MEDICATIONS:  Current Outpatient Medications  Medication Sig Dispense Refill  . acetaminophen-codeine (TYLENOL #3) 300-30 MG tablet Take 1 tablet by mouth every 6 (six) hours as needed for moderate pain. 30 tablet 0  . ALPRAZolam (XANAX) 1 MG tablet Take 1 tablet (1 mg total) by mouth at bedtime as needed for anxiety. 30 tablet 0  . capecitabine (XELODA) 500 MG tablet Take 3 tablets (1,500 mg total) by mouth 2 (two) times daily after a meal. Take on days 1-7 & 15-21 of each 28 day cycle 84 tablet 0  . CINNAMON PO Take 1 tablet by mouth daily.     . clindamycin (CLINDAGEL) 1 % gel Apply topically 2 (two) times daily. 60 g 1  . doxycycline (VIBRA-TABS) 100 MG tablet Take 1  tablet (100 mg total) by mouth 2 (two) times daily. 60 tablet 2  . fluticasone (FLONASE) 50 MCG/ACT nasal spray Place 1 spray into both nostrils daily. (Patient taking differently: Place 1 spray into both nostrils daily as needed for allergies. ) 16 g 2  . insulin NPH-regular Human (NOVOLIN 70/30) (70-30) 100 UNIT/ML injection Inject 25 Units into the skin 2 (two) times daily with a meal.     . levothyroxine (SYNTHROID, LEVOTHROID) 112 MCG tablet Take 112 mcg by mouth daily before breakfast.    . Omega-3 Fatty Acids (FISH OIL) 1000 MG CAPS Take 1,000 mg by mouth daily.     . polyethylene glycol (MIRALAX / GLYCOLAX) packet Take 17 g by mouth daily. 14 each 0  . propranolol (INDERAL) 10 MG tablet TAKE 1 TABLET BY MOUTH EVERY DAY 90 tablet 0  . TURMERIC PO Take 1 capsule by mouth daily.     Marland Kitchen atorvastatin (LIPITOR) 10 MG tablet Take 1 tablet (10 mg total) by mouth  daily. 90 tablet 3   No current facility-administered medications for this visit.     PHYSICAL EXAMINATION: ECOG PERFORMANCE STATUS: 1 - Symptomatic but completely ambulatory  Vitals:   09/22/17 1500  BP: 129/84  Pulse: 86  Resp: 18  Temp: 98.8 F (37.1 C)  SpO2: 100%   Filed Weights   09/22/17 1500  Weight: 145 lb 11.2 oz (66.1 kg)    GENERAL:alert, no distress and comfortable SKIN: scattered acne-type rash with dryness to face, mild rash on chest. No palmar or plantar erythema or dryness EYES: normal, Conjunctiva are pink and non-injected, sclera clear OROPHARYNX:no thrush or ulcers LYMPH:  no palpable cervical or supraclavicular lymphadenopathy LUNGS: clear to auscultation with normal breathing effort HEART: regular rate & rhythm and no murmurs and no lower extremity edema ABDOMEN: abdomen soft, non-tender and normal bowel sounds. Hepatomegaly without tenderness  Musculoskeletal:no cyanosis of digits and no clubbing  NEURO: alert & oriented x 3 with fluent speech, no focal motor/sensory deficits  LABORATORY DATA:  I have reviewed the data as listed CBC Latest Ref Rng & Units 09/22/2017 09/08/2017 08/25/2017  WBC 3.9 - 10.3 K/uL 8.3 9.6 10.0  Hemoglobin 11.6 - 15.9 g/dL 10.4(L) 9.2(L) 9.2(L)  Hematocrit 34.8 - 46.6 % 33.5(L) 31.4(L) 31.2(L)  Platelets 145 - 400 K/uL 254 318 272     CMP Latest Ref Rng & Units 09/22/2017 09/08/2017 08/25/2017  Glucose 70 - 99 mg/dL 193(H) 243(H) 176(H)  BUN 6 - 20 mg/dL '7 7 11  '$ Creatinine 0.44 - 1.00 mg/dL 0.70 0.75 0.84  Sodium 135 - 145 mmol/L 140 140 139  Potassium 3.5 - 5.1 mmol/L 3.9 4.4 3.8  Chloride 98 - 111 mmol/L 104 104 106  CO2 22 - 32 mmol/L '28 27 22  '$ Calcium 8.9 - 10.3 mg/dL 8.3(L) 8.6 8.1(L)  Total Protein 6.5 - 8.1 g/dL 6.8 7.1 7.5  Total Bilirubin 0.3 - 1.2 mg/dL 0.5 0.2 0.3  Alkaline Phos 38 - 126 U/L 179(H) 207(H) 227(H)  AST 15 - 41 U/L 14(L) 14 24  ALT 0 - 44 U/L '13 8 7   '$ CEA: 05/31/2017: 353.3 07/07/2017:  494.58 08/10/2017: 754.17 08/25/2017: 862.87 (began treatment) 09/22/2017: 61.0   PATHOLOGY REPORT:  Diagnosis 06/11/2017 Liver, needle/core biopsy - METASTATIC ADENOCARCINOMA, CONSISTENT WITH COLONIC PRIMARY.   RADIOGRAPHIC STUDIES: I have personally reviewed the radiological images as listed and agreed with the findings in the report. No results found.   ASSESSMENT & PLAN: ASSESSMENT & PLAN: 47 y.o. Sylvester Harder  female, with past medical history of diabetes, hypertension, presented with left lower quadrant abdominal pain, hematochezia, fatigue and weight loss  1. Sigmoid adenocarcinoma, with abdominopelvic adenopathy andhepatic metastasis, stage IV, MSI-stable  -Ms. Lape appears stable. She completed cycle 2 vectibix and Xeloda, increased to 2000 mg AM/1500 mg PM 1 week on and 1 week off. She tolerated treatment well overall, with mild skin rash, fatigue, and nausea. Symptoms are well controlled. Labs reviewed, CBC, CMP, Mg are adequate for treatment. CEA is markedly decreased to 61 today, previously 862 on the day she began treatment. This likely indicates response to therapy.  -I recommend to proceed with cycle 3 at current dose, I refilled Xeloda today.   2. Anemia, iron deficiency  -She takes 1 tab oral iron most days. Iron studies on 6/18 remain low. She has been encouraged to consider IV Feraheme but prefers to stay on oral supplement for now. I recommend she increase to 2 tabs per day. She agrees to try.  3. DM, HTN, HL -BG 193 today; she indicates she eats sweets when stressed. I encouraged her to eat well rounded healthy diet and drink plenty of liquids. She is on insulin.  -BP normal lately   4. Weight loss -weight is stable overall   5. Genetics  -She was seen by genetics counselor Roma Kayser on 5/20 and declined further testing   6. Financial and social support, anxiety/depression  -She is seen on going by chaplain, SW and GI nurse navigator. -Her son recently  started counseling via [facility], I encouraged her for him to continue  7. Goals of care discussion  -We previously discussed her cancer like likely incurable, her treatment goal is disease control   8. Rash on face, chest, scalp, back  -She had mild facial and body acne before treatment, increase rash is secondary to vectibix. She applies topical clindagel and coconut oil. Dr. Burr Medico prescribed oral doxy at last visit, she has not started yet. I recommend she do so.  9. Syncopal episode 09/06/17 with warmth and dizziness  -Etiology unknown, she did not seek medical care. Differential includes dehydration vs hypo/hyperglycemia vs medication vs cardiac.  Physical exam is unremarkable today. I urged her to call [facility] if it happens again, she should go to ER for evaluation. She understands.   PLAN:  -Labs reviewed, proceed with cycle 3 Vectibix/Xeloda 2000 mg AM/1500 mg PM 1 week on/1 week off -Continue skin regimen for rash, begin doxycycline  -Increase oral iron to 2 tabs daily  -Refill Xeloda  -Completed handicap form for patient   All questions were answered. The patient knows to call the clinic with any problems, questions or concerns. No barriers to learning was detected. I spent 20 minutes counseling the patient face to face. The total time spent in the appointment was 25 minutes and more than 50% was on counseling and review of test results     Alla Feeling, NP 09/22/17

## 2017-09-22 ENCOUNTER — Encounter: Payer: Self-pay | Admitting: General Practice

## 2017-09-22 ENCOUNTER — Inpatient Hospital Stay (HOSPITAL_BASED_OUTPATIENT_CLINIC_OR_DEPARTMENT_OTHER): Payer: BLUE CROSS/BLUE SHIELD | Admitting: Nurse Practitioner

## 2017-09-22 ENCOUNTER — Encounter: Payer: Self-pay | Admitting: Nurse Practitioner

## 2017-09-22 ENCOUNTER — Inpatient Hospital Stay: Payer: BLUE CROSS/BLUE SHIELD

## 2017-09-22 ENCOUNTER — Inpatient Hospital Stay: Payer: BLUE CROSS/BLUE SHIELD | Attending: Hematology

## 2017-09-22 VITALS — BP 129/84 | HR 86 | Temp 98.8°F | Resp 18 | Ht 65.0 in | Wt 145.7 lb

## 2017-09-22 DIAGNOSIS — I700 Atherosclerosis of aorta: Secondary | ICD-10-CM

## 2017-09-22 DIAGNOSIS — C787 Secondary malignant neoplasm of liver and intrahepatic bile duct: Secondary | ICD-10-CM | POA: Diagnosis not present

## 2017-09-22 DIAGNOSIS — I10 Essential (primary) hypertension: Secondary | ICD-10-CM | POA: Insufficient documentation

## 2017-09-22 DIAGNOSIS — D509 Iron deficiency anemia, unspecified: Secondary | ICD-10-CM | POA: Insufficient documentation

## 2017-09-22 DIAGNOSIS — R21 Rash and other nonspecific skin eruption: Secondary | ICD-10-CM | POA: Insufficient documentation

## 2017-09-22 DIAGNOSIS — E119 Type 2 diabetes mellitus without complications: Secondary | ICD-10-CM | POA: Diagnosis not present

## 2017-09-22 DIAGNOSIS — R55 Syncope and collapse: Secondary | ICD-10-CM

## 2017-09-22 DIAGNOSIS — C19 Malignant neoplasm of rectosigmoid junction: Secondary | ICD-10-CM | POA: Insufficient documentation

## 2017-09-22 DIAGNOSIS — E039 Hypothyroidism, unspecified: Secondary | ICD-10-CM | POA: Diagnosis not present

## 2017-09-22 DIAGNOSIS — D500 Iron deficiency anemia secondary to blood loss (chronic): Secondary | ICD-10-CM

## 2017-09-22 DIAGNOSIS — C187 Malignant neoplasm of sigmoid colon: Secondary | ICD-10-CM

## 2017-09-22 DIAGNOSIS — Z5112 Encounter for antineoplastic immunotherapy: Secondary | ICD-10-CM | POA: Insufficient documentation

## 2017-09-22 LAB — CMP (CANCER CENTER ONLY)
ALT: 13 U/L (ref 0–44)
AST: 14 U/L — ABNORMAL LOW (ref 15–41)
Albumin: 3.3 g/dL — ABNORMAL LOW (ref 3.5–5.0)
Alkaline Phosphatase: 179 U/L — ABNORMAL HIGH (ref 38–126)
Anion gap: 8 (ref 5–15)
BUN: 7 mg/dL (ref 6–20)
CO2: 28 mmol/L (ref 22–32)
Calcium: 8.3 mg/dL — ABNORMAL LOW (ref 8.9–10.3)
Chloride: 104 mmol/L (ref 98–111)
Creatinine: 0.7 mg/dL (ref 0.44–1.00)
GFR, Est AFR Am: >60 mL/min (ref >60)
GFR, Estimated: >60 mL/min (ref >60)
Glucose, Bld: 193 mg/dL — ABNORMAL HIGH (ref 70–99)
Potassium: 3.9 mmol/L (ref 3.5–5.1)
Sodium: 140 mmol/L (ref 135–145)
Total Bilirubin: 0.5 mg/dL (ref 0.3–1.2)
Total Protein: 6.8 g/dL (ref 6.5–8.1)

## 2017-09-22 LAB — CBC WITH DIFFERENTIAL (CANCER CENTER ONLY)
Basophils Absolute: 0.1 10*3/uL (ref 0.0–0.1)
Basophils Relative: 1 %
Eosinophils Absolute: 0.4 10*3/uL (ref 0.0–0.5)
Eosinophils Relative: 5 %
HCT: 33.5 % — ABNORMAL LOW (ref 34.8–46.6)
Hemoglobin: 10.4 g/dL — ABNORMAL LOW (ref 11.6–15.9)
Lymphocytes Relative: 25 %
Lymphs Abs: 2 10*3/uL (ref 0.9–3.3)
MCH: 23.2 pg — ABNORMAL LOW (ref 25.1–34.0)
MCHC: 31.1 g/dL — ABNORMAL LOW (ref 31.5–36.0)
MCV: 74.6 fL — ABNORMAL LOW (ref 79.5–101.0)
Monocytes Absolute: 0.5 10*3/uL (ref 0.1–0.9)
Monocytes Relative: 7 %
Neutro Abs: 5.2 10*3/uL (ref 1.5–6.5)
Neutrophils Relative %: 62 %
Platelet Count: 254 10*3/uL (ref 145–400)
RBC: 4.49 MIL/uL (ref 3.70–5.45)
RDW: 19.9 % — ABNORMAL HIGH (ref 11.2–14.5)
WBC Count: 8.3 10*3/uL (ref 3.9–10.3)

## 2017-09-22 LAB — MAGNESIUM: Magnesium: 1.9 mg/dL (ref 1.7–2.4)

## 2017-09-22 LAB — CEA (IN HOUSE-CHCC): CEA (CHCC-In House): 61 ng/mL — ABNORMAL HIGH (ref 0.00–5.00)

## 2017-09-22 MED ORDER — SODIUM CHLORIDE 0.9 % IV SOLN
Freq: Once | INTRAVENOUS | Status: AC
  Administered 2017-09-22: 17:00:00 via INTRAVENOUS

## 2017-09-22 MED ORDER — SODIUM CHLORIDE 0.9 % IV SOLN
6.0000 mg/kg | Freq: Once | INTRAVENOUS | Status: AC
  Administered 2017-09-22: 400 mg via INTRAVENOUS
  Filled 2017-09-22: qty 20

## 2017-09-22 MED FILL — DOXYCYCLINE HYCLATE 100 MG: 100 | 30 days supply | Qty: 60 | Fill #0

## 2017-09-22 NOTE — Patient Instructions (Signed)
[facility] Discharge Instructions for Patients Receiving Chemotherapy  Today you received the following chemotherapy agents: Vectibix  To help prevent nausea and vomiting after your treatment, we encourage you to take your nausea medication as prescribed.    If you develop nausea and vomiting that is not controlled by your nausea medication, call the clinic.   BELOW ARE SYMPTOMS THAT SHOULD BE REPORTED IMMEDIATELY:  *FEVER GREATER THAN 100.5 F  *CHILLS WITH OR WITHOUT FEVER  NAUSEA AND VOMITING THAT IS NOT CONTROLLED WITH YOUR NAUSEA MEDICATION  *UNUSUAL SHORTNESS OF BREATH  *UNUSUAL BRUISING OR BLEEDING  TENDERNESS IN MOUTH AND THROAT WITH OR WITHOUT PRESENCE OF ULCERS  *URINARY PROBLEMS  *BOWEL PROBLEMS  UNUSUAL RASH Items with * indicate a potential emergency and should be followed up as soon as possible.  Feel free to call the clinic should you have any questions or concerns. The clinic phone number is [phone].  Please show the CHEMO ALERT CARD at check-in to the Emergency Department and triage nurse.   

## 2017-09-22 NOTE — Progress Notes (Signed)
[facility] Spiritual Care Note  Beverly Schwartz was in good spirits today in infusion. She was relieved and encouraged to receive her tumor-marker results from [facility], is enjoying a visit from her longtime friend Berline Chough (who lives in [place]), and feels relief and gratitude that her son has started seeing a Social worker at [employer]. She has had other times with friends lately that have distracted her from worries and helped her be present in the moment. Per pt, she is slowly chipping away at her to-do list of important but stressful responsibilities. She has my card and plans to f/u by phone to set another appt.  [place], [place], [facility] Pager [phone] Voicemail [phone]

## 2017-09-23 ENCOUNTER — Other Ambulatory Visit: Payer: Self-pay | Admitting: Hematology

## 2017-09-23 DIAGNOSIS — C19 Malignant neoplasm of rectosigmoid junction: Secondary | ICD-10-CM

## 2017-09-23 MED ORDER — CAPECITABINE 500 MG PO TABS: ORAL_TABLET | ORAL | 1 refills | Status: DC

## 2017-09-25 ENCOUNTER — Other Ambulatory Visit: Payer: Self-pay | Admitting: Pharmacist

## 2017-09-25 DIAGNOSIS — C19 Malignant neoplasm of rectosigmoid junction: Secondary | ICD-10-CM

## 2017-09-25 MED ORDER — CAPECITABINE 500 MG PO TABS: ORAL_TABLET | ORAL | 1 refills | Status: DC

## 2017-09-25 NOTE — Telephone Encounter (Signed)
Oral Oncology Pharmacist Encounter  Received message from [facility] with dose clarification questions about patient's current prescription for Xeloda (capecitabine). Original prescription had been sent to the pharmacy with a note to pharmacy stating that the dose would be 875 mg/m twice daily. Patient's dose was 1500 mg in a.m. and 1500 mg in p.m., taken twice daily every other week.  Dose has now been increased by provider to 1000 mg/m. Old note to pharmacy remain on we ordered prescription and cause some confusion during pharmacist verification.  New dose is 2000 mg in a.m. and 1500 mg in p.m., taken twice daily every other week.  Prescription resent to [facility] with updated note to pharmacy stating that patient is getting a dose increased to 1000 mg/m.  Johny Drilling, PharmD, BCPS, BCOP  09/25/2017   12:12 PM Oral Oncology Clinic [phone]

## 2017-10-02 ENCOUNTER — Telehealth: Payer: Self-pay

## 2017-10-02 NOTE — Telephone Encounter (Signed)
Patient called regarding her Xeloda.  I left voice message for Evelena Peat in [facility] to either call me back or call the patient.

## 2017-10-06 ENCOUNTER — Inpatient Hospital Stay: Payer: BLUE CROSS/BLUE SHIELD

## 2017-10-06 ENCOUNTER — Inpatient Hospital Stay (HOSPITAL_BASED_OUTPATIENT_CLINIC_OR_DEPARTMENT_OTHER): Payer: BLUE CROSS/BLUE SHIELD | Admitting: Nurse Practitioner

## 2017-10-06 ENCOUNTER — Other Ambulatory Visit: Payer: Self-pay | Admitting: Hematology

## 2017-10-06 ENCOUNTER — Encounter: Payer: Self-pay | Admitting: Nurse Practitioner

## 2017-10-06 VITALS — BP 113/88 | HR 79 | Temp 98.7°F | Resp 18 | Ht 65.0 in | Wt 146.5 lb

## 2017-10-06 DIAGNOSIS — C19 Malignant neoplasm of rectosigmoid junction: Secondary | ICD-10-CM

## 2017-10-06 DIAGNOSIS — L270 Generalized skin eruption due to drugs and medicaments taken internally: Secondary | ICD-10-CM

## 2017-10-06 DIAGNOSIS — D500 Iron deficiency anemia secondary to blood loss (chronic): Secondary | ICD-10-CM

## 2017-10-06 DIAGNOSIS — R600 Localized edema: Secondary | ICD-10-CM

## 2017-10-06 DIAGNOSIS — C187 Malignant neoplasm of sigmoid colon: Secondary | ICD-10-CM

## 2017-10-06 LAB — CMP (CANCER CENTER ONLY)
ALT: 12 U/L (ref 0–44)
AST: 14 U/L — ABNORMAL LOW (ref 15–41)
Albumin: 3.3 g/dL — ABNORMAL LOW (ref 3.5–5.0)
Alkaline Phosphatase: 169 U/L — ABNORMAL HIGH (ref 38–126)
Anion gap: 8 (ref 5–15)
BUN: 6 mg/dL (ref 6–20)
CO2: 26 mmol/L (ref 22–32)
Calcium: 8.4 mg/dL — ABNORMAL LOW (ref 8.9–10.3)
Chloride: 106 mmol/L (ref 98–111)
Creatinine: 0.7 mg/dL (ref 0.44–1.00)
GFR, Est AFR Am: >60 mL/min (ref >60)
GFR, Estimated: >60 mL/min (ref >60)
Glucose, Bld: 130 mg/dL — ABNORMAL HIGH (ref 70–99)
Potassium: 3.5 mmol/L (ref 3.5–5.1)
Sodium: 140 mmol/L (ref 135–145)
Total Bilirubin: 0.5 mg/dL (ref 0.3–1.2)
Total Protein: 7.1 g/dL (ref 6.5–8.1)

## 2017-10-06 LAB — CBC WITH DIFFERENTIAL (CANCER CENTER ONLY)
Basophils Absolute: 0.1 10*3/uL (ref 0.0–0.1)
Basophils Relative: 1 %
Eosinophils Absolute: 0.3 10*3/uL (ref 0.0–0.5)
Eosinophils Relative: 4 %
HCT: 36.1 % (ref 34.8–46.6)
Hemoglobin: 11.2 g/dL — ABNORMAL LOW (ref 11.6–15.9)
Lymphocytes Relative: 25 %
Lymphs Abs: 2.1 10*3/uL (ref 0.9–3.3)
MCH: 24.2 pg — ABNORMAL LOW (ref 25.1–34.0)
MCHC: 31 g/dL — ABNORMAL LOW (ref 31.5–36.0)
MCV: 78.2 fL — ABNORMAL LOW (ref 79.5–101.0)
Monocytes Absolute: 0.5 10*3/uL (ref 0.1–0.9)
Monocytes Relative: 7 %
Neutro Abs: 5.3 10*3/uL (ref 1.5–6.5)
Neutrophils Relative %: 63 %
Platelet Count: 221 10*3/uL (ref 145–400)
RBC: 4.61 MIL/uL (ref 3.70–5.45)
RDW: 23.2 % — ABNORMAL HIGH (ref 11.2–14.5)
WBC Count: 8.4 10*3/uL (ref 3.9–10.3)

## 2017-10-06 LAB — IRON AND TIBC
Iron: 251 ug/dL — ABNORMAL HIGH (ref 41–142)
Saturation Ratios: 59 % — ABNORMAL HIGH (ref 21–57)
TIBC: 428 ug/dL (ref 236–444)
UIBC: 177 ug/dL

## 2017-10-06 LAB — MAGNESIUM: Magnesium: 1.8 mg/dL (ref 1.7–2.4)

## 2017-10-06 LAB — FERRITIN: Ferritin: 38 ng/mL (ref 11–307)

## 2017-10-06 LAB — CEA (IN HOUSE-CHCC): CEA (CHCC-In House): 28.92 ng/mL — ABNORMAL HIGH (ref 0.00–5.00)

## 2017-10-06 MED ORDER — DIPHENHYDRAMINE HCL 25 MG PO CAPS
ORAL_CAPSULE | ORAL | Status: AC
Start: 2017-10-06 — End: ?
  Filled 2017-10-06: qty 2

## 2017-10-06 MED ORDER — SODIUM CHLORIDE 0.9 % IV SOLN
Freq: Once | INTRAVENOUS | Status: AC
  Administered 2017-10-06: 14:00:00 via INTRAVENOUS

## 2017-10-06 MED ORDER — DIPHENHYDRAMINE HCL 25 MG PO TABS
50.0000 mg | ORAL_TABLET | Freq: Once | ORAL | Status: AC
  Administered 2017-10-06: 50 mg via ORAL
  Filled 2017-10-06: qty 2

## 2017-10-06 MED ORDER — METHYLPREDNISOLONE 4 MG PO TBPK: ORAL_TABLET | ORAL | 0 refills | Status: DC

## 2017-10-06 MED ORDER — PANITUMUMAB CHEMO INJECTION 100 MG/5ML
6.0000 mg/kg | Freq: Once | INTRAVENOUS | Status: AC
  Administered 2017-10-06: 400 mg via INTRAVENOUS
  Filled 2017-10-06: qty 20

## 2017-10-06 MED FILL — METHYLPREDNISOLONE 4 MG TAB: 4 | 6 days supply | Qty: 21 | Fill #0

## 2017-10-06 NOTE — Progress Notes (Addendum)
[place]  [phone]) [phone] [phone]) [phone]  Clinic Follow up Note   Patient Care Team: Esaw Grandchild, NP as PCP - General (Family Medicine) Delrae Rend, MD as Consulting Physician (Endocrinology) Truitt Merle, MD as Consulting Physician (Hematology) Alla Feeling, NP as Nurse Practitioner (Nurse Practitioner) Clarene Essex, MD as Consulting Physician (Gastroenterology) 10/06/2017  SUMMARY OF ONCOLOGIC HISTORY: Oncology History   Cancer Staging Malignant neoplasm of rectosigmoid junction [facility]) Staging form: Colon and Rectum, AJCC 8th Edition - Clinical stage from 06/11/2017: Stage IVA (cTX, cN1b, pM1a) - Signed by Truitt Merle, MD on 06/15/2017       Malignant neoplasm of rectosigmoid junction ([place])   05/30/2017 Imaging    CT AP IMPRESSION: 1. 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 Abdominopelvic adenopathy. Rectosigmoid mass with suggestion of partial obstruction as evidenced by large colonic stool burden more proximally. 2.  Aortic Atherosclerosis (ICD10-I70.0).  This is age advanced.      05/31/2017 Tumor Marker    CEA 353.3 (elevated) AFP 4.5 (normal)      05/31/2017 Initial Biopsy    Diagnosis Colon, biopsy, sigmoid - INVASIVE ADENOCARCINOMA - SEE COMMENT      05/31/2017 Imaging    CT CHEST IMPRESSION: 1. No evidence of metastatic disease in the chest. 2. No acute findings.  Aortic Atherosclerosis (ICD10-I70.0).       05/31/2017 Procedure    Colonoscopy per Dr. Watt Climes Findings: An infiltrative and ulcerated partially obstructing medium-sized mass was found in the recto-sigmoid colon. The mass was circumferential. The mass measured four cm in length. No bleeding was present.   Impression - One small polyp in the rectum. - The examination was otherwise normal. - Malignant partially obstructing tumor in the recto-sigmoid colon. Biopsied. - One medium polyp in the  proximal sigmoid colon. - The examination was otherwise normal. - Internal hemorrhoids.      06/03/2017 Initial Diagnosis    Malignant neoplasm of transverse colon ([place])      06/11/2017 Cancer Staging    Staging form: Colon and Rectum, AJCC 8th Edition - Clinical stage from 06/11/2017: Stage IVA (cTX, cN1b, pM1a) - Signed by Truitt Merle, MD on 06/15/2017      06/11/2017 Pathology Results    Liver biopsy confirmed metastatic colon cancer      07/23/2017 Imaging    CT CAP IMPRESSION: 1. Mild progression of hepatic metastasis. 2. Similar rectosigmoid primary with abdominopelvic nodal metastasis. 3.  No acute process or evidence of metastatic disease in the chest. 4. Aortic Atherosclerosis (ICD10-I70.0). Coronary artery atherosclerosis. This is age advanced. 5. Proximal colonic constipation, again suggesting a component of partial obstruction at the primary site. 6. Mild ascending aortic dilatation at 4.1 cm.      08/25/2017 -  Chemotherapy    -Xeloda '1500mg'$  BID 1 week on and 1 week off starting 08/25/17 -Vectibix every 2 weeks starting 08/25/17     CURRENT THERAPY: -Xeloda '1500mg'$  BID 1 week on and 1 week off starting 08/25/17. Increased to '2000mg'$  in the AM and '1500mg'$  in the PM starting 09/08/17 -Vectibix every 2 weeks starting 08/25/17    INTERVAL HISTORY: Ms. Medero returns for follow up with her mother as scheduled. She completed cycle 3 vectibix on 09/22/17 and xeloda on 7/8. She takes 2000 mg AM and 1500 mg PM 1 week on and 1 week off. She is compliant. On 7/12 she developed periorbital swelling that worsened over the weekend with itching and some watery discharge. No eye  redness, pain, or vision change/loss. Denies oral, tongue, throat swelling or difficulty breathing. She took 1 benadryl that did not help. Face is red with dryness, skin rash to scalp and chest is stable. She applies coconut oil, aloe, and clinda gel to face. She took oral doxy for the week of vectibix. No other new  medication. She has slight nasal congestion. No cough, chest pain, dyspnea, or fever. She is fatigued, but continues to care for her self and son with effort. Appetite is good. She had mild nausea 1-2 days after vectibix, did not require medication. Drank gingerale with relief. No vomiting, constipation, or diarrhea. Denies mucositis or hand/foot redness or sensitivity. She had 1 episode of abdominal pain after hours, called and left a message. Pain improved with 1 dose tylenol #3.   REVIEW OF SYSTEMS:   Constitutional: Denies fevers, chills or abnormal weight loss (+) fatigue  Eyes: Denies blurriness of vision, eye pain, redness, or vision change/loss (+) periorbital swelling (+) watery eyes  Ears, nose, mouth, throat, and face: Denies mucositis or sore throat Respiratory: Denies cough, dyspnea or wheezes Cardiovascular: Denies palpitation, chest discomfort or lower extremity swelling Gastrointestinal:  Denies vomiting, constipation, diarrhea, heartburn or change in bowel habits (+) occasional nausea 1-2 days after infusion (+) transient abdominal pain, resolved with tylenol #3 Skin: (+) skin rash to scalp, face, chest. Denies hand/foot redness, or sensitivity  Lymphatics: Denies new lymphadenopathy or easy bruising Neurological:Denies numbness, tingling or new weaknesses Behavioral/Psych: Mood is stable, no new changes  All other systems were reviewed with the patient and are negative.  MEDICAL HISTORY:  Past Medical History:  Diagnosis Date  . Anxiety   . Cancer ([place])    colon, liver  . Chest pain   . Colon cancer ([place])   . Complication of anesthesia   . Depression   . Diabetes mellitus   . Dizziness   . Family history of breast cancer   . Family history of stomach cancer   . Hyperlipidemia   . Hypertension   . Hypothyroidism   . Obesity   . PONV (postoperative nausea and vomiting)   . Tachycardia     SURGICAL HISTORY: Past Surgical History:  Procedure Laterality Date  .  FLEXIBLE SIGMOIDOSCOPY N/A 05/31/2017   Procedure: FLEXIBLE SIGMOIDOSCOPY;  Surgeon: Clarene Essex, MD;  Location: [facility] ENDOSCOPY;  Service: Endoscopy;  Laterality: N/A;  . WISDOM TOOTH EXTRACTION     age 24's    I have reviewed the social history and family history with the patient and they are unchanged from previous note.  ALLERGIES:  is allergic to bupropion and amoxicillin.  MEDICATIONS:  Current Outpatient Medications  Medication Sig Dispense Refill  . acetaminophen-codeine (TYLENOL #3) 300-30 MG tablet Take 1 tablet by mouth every 6 (six) hours as needed for moderate pain. 30 tablet 0  . ALPRAZolam (XANAX) 1 MG tablet Take 1 tablet (1 mg total) by mouth at bedtime as needed for anxiety. 30 tablet 0  . atorvastatin (LIPITOR) 10 MG tablet Take 1 tablet (10 mg total) by mouth daily. 90 tablet 3  . capecitabine (XELODA) 500 MG tablet 4 tabs in morning and 3 tabs in evening. Take on days 1-7 & 15-21 of each 28 day cycle 98 tablet 1  . CINNAMON PO Take 1 tablet by mouth daily.     . clindamycin (CLINDAGEL) 1 % gel Apply topically 2 (two) times daily. 60 g 1  . doxycycline (VIBRA-TABS) 100 MG tablet Take 1 tablet (100 mg total)  by mouth 2 (two) times daily. 60 tablet 2  . fluticasone (FLONASE) 50 MCG/ACT nasal spray Place 1 spray into both nostrils daily. (Patient taking differently: Place 1 spray into both nostrils daily as needed for allergies. ) 16 g 2  . insulin NPH-regular Human (NOVOLIN 70/30) (70-30) 100 UNIT/ML injection Inject 25 Units into the skin 2 (two) times daily with a meal.     . levothyroxine (SYNTHROID, LEVOTHROID) 112 MCG tablet Take 112 mcg by mouth daily before breakfast.    . Omega-3 Fatty Acids (FISH OIL) 1000 MG CAPS Take 1,000 mg by mouth daily.     . polyethylene glycol (MIRALAX / GLYCOLAX) packet Take 17 g by mouth daily. 14 each 0  . propranolol (INDERAL) 10 MG tablet TAKE 1 TABLET BY MOUTH EVERY DAY 90 tablet 0  . TURMERIC PO Take 1 capsule by mouth daily.     .  methylPREDNISolone (MEDROL DOSEPAK) 4 MG TBPK tablet Take as directed 21 tablet 0   No current facility-administered medications for this visit.     PHYSICAL EXAMINATION: ECOG PERFORMANCE STATUS: 1 - Symptomatic but completely ambulatory  Vitals:   10/06/17 1306 10/06/17 1320  BP: (!) 133/92 113/88  Pulse: 79   Resp: 18   Temp: 98.7 F (37.1 C)   SpO2: 100%    Filed Weights   10/06/17 1306  Weight: 146 lb 8 oz (66.5 kg)    GENERAL:alert, no distress and comfortable SKIN: facial erythema with dry skin and scattered mild acne-type rash to scalp, face, and upper chest  EYES: normal, Conjunctiva are pink and non-injected, sclera clear. Mild periorbital edema  OROPHARYNX:no thrush or ulcers  LYMPH:  no palpable cervical or supraclavicular lymphadenopathy  LUNGS: clear to auscultation with normal breathing effort HEART: regular rate & rhythm and no murmurs and lower extremity edema ABDOMEN:abdomen soft, non-tender and normal bowel sounds Musculoskeletal:no cyanosis of digits and no clubbing  NEURO: alert & oriented x 3 with fluent speech, no focal motor/sensory deficits  LABORATORY DATA:  I have reviewed the data as listed CBC Latest Ref Rng & Units 10/06/2017 09/22/2017 09/08/2017  WBC 3.9 - 10.3 K/uL 8.4 8.3 9.6  Hemoglobin 11.6 - 15.9 g/dL 11.2(L) 10.4(L) 9.2(L)  Hematocrit 34.8 - 46.6 % 36.1 33.5(L) 31.4(L)  Platelets 145 - 400 K/uL 221 254 318     CMP Latest Ref Rng & Units 10/06/2017 09/22/2017 09/08/2017  Glucose 70 - 99 mg/dL 130(H) 193(H) 243(H)  BUN 6 - 20 mg/dL '6 7 7  '$ Creatinine 0.44 - 1.00 mg/dL 0.70 0.70 0.75  Sodium 135 - 145 mmol/L 140 140 140  Potassium 3.5 - 5.1 mmol/L 3.5 3.9 4.4  Chloride 98 - 111 mmol/L 106 104 104  CO2 22 - 32 mmol/L '26 28 27  '$ Calcium 8.9 - 10.3 mg/dL 8.4(L) 8.3(L) 8.6  Total Protein 6.5 - 8.1 g/dL 7.1 6.8 7.1  Total Bilirubin 0.3 - 1.2 mg/dL 0.5 0.5 0.2  Alkaline Phos 38 - 126 U/L 169(H) 179(H) 207(H)  AST 15 - 41 U/L 14(L) 14(L) 14    ALT 0 - 44 U/L '12 13 8   '$ CEA: 05/31/2017: 353.3 07/07/2017: 494.58 08/10/2017: 754.17 08/25/2017: 862.87 (began treatment) 09/22/2017: 61.0 10/06/2017: 28.92  PATHOLOGY REPORT:  Diagnosis 06/11/2017 Liver, needle/core biopsy - METASTATIC ADENOCARCINOMA, CONSISTENT WITH COLONIC PRIMARY.   RADIOGRAPHIC STUDIES: I have personally reviewed the radiological images as listed and agreed with the findings in the report. No results found.   ASSESSMENT & PLAN: 47 y.o.[place] female, with past  medical history of diabetes, hypertension, presented with left lower quadrant abdominal pain, hematochezia, fatigue and weight loss  1. Sigmoid adenocarcinoma, with abdominopelvic adenopathy andhepatic metastasis, stage IV, MSI-stable  2. Anemia, iron deficiency  3. DM, HTN, HL 4. Weight loss 5. Genetics 6. Financial and social support 7. Anxiety, depression 8. Goals of care discussion  9. Rash face, chest, scalp, back  10. Unwitnessed ? syncopal episode 09/06/17 with warmth and dizziness  11. Periorbital edema and facial erythema, possibly related to vectibix   Ms. Mcchesney appears stable. She completed 3 cycles vectibix and Xeloda 2000 mg AM/1500 mg PM 1 week on and 1 week off. She is tolerating treatment well overall. Abdominal pain is less frequent, managed with tylenol #3 but rarely uses. She has mild acne-type skin rash secondary to vectibix. She applies clindagel and coconut oil, she takes doxycycline during the week of her infusion; she is reluctant to take daily antibiotics. She developed facial erythema and periorbital edema during we week off treatment, possibly related to vectibix. The patient was seen with Dr. Burr Medico who recommends to treat with medrol dose pack; if she has residual edema, she will use topical hydrocortisone, and po benadryl PRN. We discussed avoiding direct sun exposure. Will premedicate with po benadryl 50 mg x1 today with vectibix.   Labs reviewed, CEA again significantly  decreased to 28.92; indicating she is likely responding to treatment. Plan to restage after cycle 5 or 6. CBC and CMP adequate to proceed with cycle 4 today. Ferritin is improved, serum iron elevated to 251, she takes oral iron 1-2 tabs daily. She can continue once daily for now. Will monitor. F/u in 2 weeks with cycle 5.   She has high co-pay for Xeloda this month due to change in insurance details, I have asked financial advocate and Johny Drilling, PharmD in pharmacy to review.   PLAN -Labs reviewed, proceed with cycle 4 vectibix/xeloda 2000 mg AM/1500 mg PM 1 week on and 1 week off -Medrol dose pack for periorbital edema, topical hydrocortisone and oral benadryl PRN -Avoid direct sun exposure  -Po benadryl x1 with infusion today -Continue oral iron once daily  -Restage after cycle 5 or 6 -Lab, f/u in 2 weeks with cycle 5  All questions were answered. The patient knows to call the clinic with any problems, questions or concerns. No barriers to learning was detected.     Alla Feeling, NP 10/06/17   Addendum I have seen the patient, examined her. I agree with the assessment and and plan and have edited the notes.   Ms Taitt is tolerating treatment well. She has developed diffuse facial erythema and edema, probably allergy reaction, versus drug rash from vectibix.  We will give her a short course steroids.  She knows to avoid sun exposure, and take Benadryl also.  Lab reviewed, adequate her to continue treatment, plan to repeat staging scan next month. Her CEA has significantly decreased, indicating she is probably having good response to chemotherapy.  Truitt Merle  10/07/2017

## 2017-10-06 NOTE — Progress Notes (Signed)
Nutrition Follow-up:  Patient with metastatic colon cancer.  Patient followed by Dr. Burr Medico.    Met with patient during infusion.  Patient reports that appetite is better.  "My friend from [place] came to visit me and made me eat every 3 hours."   Reports that she is drinking oral nutrition supplement shakes ("when I remember").  No other nutrition impact symptoms per patient.     Medications: reviewed  Labs: reviewed  Anthropometrics:   Weight has increased to 146 lb 8 oz today from 144 lb on 6/18   NUTRITION DIAGNOSIS: Inadequate oral intake improved.    INTERVENTION:  Continue to encourage patient to eat q 3 hours (she has seen the results when she does this).  Encouraged good sources of protein, well-balanced diet.   Encouraged patient to continue drinking oral nutrition supplements to promote continues weight gain.     MONITORING, EVALUATION, GOAL: Patient will consume adequate calories to maintain weight   NEXT VISIT: August 15 during infusion  Rhoda Waldvogel B. Zenia Resides, [place], [place] Registered Dietitian [phone] (pager)

## 2017-10-06 NOTE — Patient Instructions (Signed)
[facility] Discharge Instructions for Patients Receiving Chemotherapy  Today you received the following chemotherapy agents: Vectibix.  To help prevent nausea and vomiting after your treatment, we encourage you to take your nausea medication as directed.   If you develop nausea and vomiting that is not controlled by your nausea medication, call the clinic.   BELOW ARE SYMPTOMS THAT SHOULD BE REPORTED IMMEDIATELY:  *FEVER GREATER THAN 100.5 F  *CHILLS WITH OR WITHOUT FEVER  NAUSEA AND VOMITING THAT IS NOT CONTROLLED WITH YOUR NAUSEA MEDICATION  *UNUSUAL SHORTNESS OF BREATH  *UNUSUAL BRUISING OR BLEEDING  TENDERNESS IN MOUTH AND THROAT WITH OR WITHOUT PRESENCE OF ULCERS  *URINARY PROBLEMS  *BOWEL PROBLEMS  UNUSUAL RASH Items with * indicate a potential emergency and should be followed up as soon as possible.  Feel free to call the clinic should you have any questions or concerns. The clinic phone number is [phone].  Please show the CHEMO ALERT CARD at check-in to the Emergency Department and triage nurse.   

## 2017-10-07 ENCOUNTER — Telehealth: Payer: Self-pay | Admitting: Nurse Practitioner

## 2017-10-07 ENCOUNTER — Telehealth: Payer: Self-pay | Admitting: Pharmacist

## 2017-10-07 NOTE — Telephone Encounter (Signed)
Scheduled appt per 7/16 los - pt to get an updated schedule next visit.   

## 2017-10-07 NOTE — Telephone Encounter (Signed)
Oral Oncology Pharmacist Encounter  Received questions from NP about new copayment due for patient Xeloda (capecitabine) that is used in conjunction with infusional panitumumab for the treatment of metastatic colorectal cancer.  First fill of Xeloda $0 copayment from [place]. The first fill was dispensed on 08/19/2017.  Patient's prescription insurance coverage changed on August 22, 2017. New copayment is $493.97 for 28-day supply  I called patient's insurance for clarification about plan structure.   Patient has $400 deductible, which she has met.  After meeting deductible, patient has 35% coinsurance up to a maximum out-of-pocket of $7900.  Patient has met $619.84 of this out-of-pocket.  Out-of-pocket for prescription and medical both contribute to the $7900 maximum out-of-pocket.  Discussed above with MD. MD will offer patient to change to infusional 5-FU pump if coinsurance for the Xeloda is unaffordable.  I called and discussed above with patient. Patient states she will have a different insurance as of October 22, 2017. Patient states that this is the same insurance that she had previously where her copayment was $0. She states at that time she will be able to continue on the Xeloda.  Patient instructed to reach out to the office as soon as her new insurance becomes active. We will work with the [facility] outpatient pharmacy at that time to run test claim to see if she can fill locally as opposed to [employer] (mail-order).  At that time we can also work with patient for any copayment assistance that may be needed for her to be able to continue on her Xeloda.  Patient informed that MD will discuss with her change to infusional 5-FU if her copayment remains unaffordable at that time.  All questions answered. Patient expressed understanding and appreciation. Patient is to call the office with any additional questions or concerns.  Johny Drilling, PharmD, BCPS, BCOP  10/07/2017 10:47 AM Oral Oncology Clinic [phone]

## 2017-10-14 ENCOUNTER — Telehealth: Payer: Self-pay

## 2017-10-14 NOTE — Telephone Encounter (Signed)
Patient calls stating she needs to move her appointment on 7/31 to 8/2 am slot if possible. She has new insurance that will kick in then.  Scheduling message was sent after consulting with Cira Rue NP  Also patient noticed while on the telephone with this RN, a small area about the size of a penny on the left side of her neck, non tender.  Consulted with Cira Rue NP if patient starts having fever, chills or area increasing in size needs to be seen prior to 8/2 appointment.  Called patient with above information, patient verbalizes an understanding.

## 2017-10-19 ENCOUNTER — Telehealth: Payer: Self-pay | Admitting: Hematology

## 2017-10-19 NOTE — Telephone Encounter (Signed)
Called patient regarding voice mail message left.  Could not reach.  Left message to please call us back as message unclear.

## 2017-10-20 ENCOUNTER — Telehealth: Payer: Self-pay | Admitting: Hematology

## 2017-10-20 NOTE — Telephone Encounter (Signed)
Called pt and rescheduled appts per 7/30 sch msg

## 2017-10-21 ENCOUNTER — Inpatient Hospital Stay: Payer: BLUE CROSS/BLUE SHIELD

## 2017-10-21 ENCOUNTER — Inpatient Hospital Stay: Payer: BLUE CROSS/BLUE SHIELD | Admitting: Nurse Practitioner

## 2017-10-26 ENCOUNTER — Inpatient Hospital Stay: Payer: Self-pay | Admitting: Nurse Practitioner

## 2017-10-26 ENCOUNTER — Inpatient Hospital Stay: Payer: Self-pay

## 2017-10-26 ENCOUNTER — Telehealth: Payer: Self-pay | Admitting: *Deleted

## 2017-10-26 NOTE — Progress Notes (Deleted)
[facility]  [phone] [phone]  Clinic Follow up Note   Patient Care Team: Esaw Grandchild, NP as PCP - General (Family Medicine) Delrae Rend, MD as Consulting Physician (Endocrinology) Truitt Merle, MD as Consulting Physician (Hematology) Alla Feeling, NP as Nurse Practitioner (Nurse Practitioner) Clarene Essex, MD as Consulting Physician (Gastroenterology) 10/26/2017  SUMMARY OF ONCOLOGIC HISTORY: Oncology History   Cancer Staging Malignant neoplasm of rectosigmoid junction [facility]) Staging form: Colon and Rectum, AJCC 8th Edition - Clinical stage from 06/11/2017: Stage IVA (cTX, cN1b, pM1a) - Signed by Truitt Merle, MD on 06/15/2017       Malignant neoplasm of rectosigmoid junction ([place])   05/30/2017 Imaging    CT AP IMPRESSION: 1. 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 Abdominopelvic adenopathy. Rectosigmoid mass with suggestion of partial obstruction as evidenced by large colonic stool burden more proximally. 2.  Aortic Atherosclerosis (ICD10-I70.0).  This is age advanced.      05/31/2017 Tumor Marker    CEA 353.3 (elevated) AFP 4.5 (normal)      05/31/2017 Initial Biopsy    Diagnosis Colon, biopsy, sigmoid - INVASIVE ADENOCARCINOMA - SEE COMMENT      05/31/2017 Imaging    CT CHEST IMPRESSION: 1. No evidence of metastatic disease in the chest. 2. No acute findings.  Aortic Atherosclerosis (ICD10-I70.0).       05/31/2017 Procedure    Colonoscopy per Dr. Watt Climes Findings: An infiltrative and ulcerated partially obstructing medium-sized mass was found in the recto-sigmoid colon. The mass was circumferential. The mass measured four cm in length. No bleeding was present.   Impression - One small polyp in the rectum. - The examination was otherwise normal. - Malignant partially obstructing tumor in the recto-sigmoid colon. Biopsied. - One medium polyp in the proximal  sigmoid colon. - The examination was otherwise normal. - Internal hemorrhoids.      06/03/2017 Initial Diagnosis    Malignant neoplasm of transverse colon ([place])      06/11/2017 Cancer Staging    Staging form: Colon and Rectum, AJCC 8th Edition - Clinical stage from 06/11/2017: Stage IVA (cTX, cN1b, pM1a) - Signed by Truitt Merle, MD on 06/15/2017      06/11/2017 Pathology Results    Liver biopsy confirmed metastatic colon cancer      07/23/2017 Imaging    CT CAP IMPRESSION: 1. Mild progression of hepatic metastasis. 2. Similar rectosigmoid primary with abdominopelvic nodal metastasis. 3.  No acute process or evidence of metastatic disease in the chest. 4. Aortic Atherosclerosis (ICD10-I70.0). Coronary artery atherosclerosis. This is age advanced. 5. Proximal colonic constipation, again suggesting a component of partial obstruction at the primary site. 6. Mild ascending aortic dilatation at 4.1 cm.      08/25/2017 -  Chemotherapy    -Xeloda '1500mg'$  BID 1 week on and 1 week off starting 08/25/17 -Vectibix every 2 weeks starting 08/25/17     CURRENT THERAPY: -Xeloda '1500mg'$  BID 1 week on and 1 week off starting 08/25/17. Increased to '2000mg'$  in the AM and '1500mg'$  in the PM starting 09/08/17 -Vectibix every 2 weeks starting 08/25/17   INTERVAL HISTORY: Please see below for problem oriented charting.  REVIEW OF SYSTEMS:   Constitutional: Denies fevers, chills or abnormal weight loss Eyes: Denies blurriness of vision Ears, nose, mouth, throat, and face: Denies mucositis or sore throat Respiratory: Denies cough, dyspnea or wheezes Cardiovascular: Denies palpitation, chest discomfort or lower extremity swelling Gastrointestinal:  Denies nausea, heartburn or change in bowel  habits Skin: Denies abnormal skin rashes Lymphatics: Denies new lymphadenopathy or easy bruising Neurological:Denies numbness, tingling or new weaknesses Behavioral/Psych: Mood is stable, no new changes  All other systems  were reviewed with the patient and are negative.  MEDICAL HISTORY:  Past Medical History:  Diagnosis Date  . Anxiety   . Cancer ([place])    colon, liver  . Chest pain   . Colon cancer ([place])   . Complication of anesthesia   . Depression   . Diabetes mellitus   . Dizziness   . Family history of breast cancer   . Family history of stomach cancer   . Hyperlipidemia   . Hypertension   . Hypothyroidism   . Obesity   . PONV (postoperative nausea and vomiting)   . Tachycardia     SURGICAL HISTORY: Past Surgical History:  Procedure Laterality Date  . FLEXIBLE SIGMOIDOSCOPY N/A 05/31/2017   Procedure: FLEXIBLE SIGMOIDOSCOPY;  Surgeon: Clarene Essex, MD;  Location: [facility] END[facility]COPY;  Service: Endoscopy;  Laterality: N/A;  . WISDOM TOOTH EXTRACTION     age 17's    I have reviewed the social history and family history with the patient and they are unchanged from previous note.  ALLERGIES:  is allergic to bupropion and amoxicillin.  MEDICATIONS:  Current Outpatient Medications  Medication Sig Dispense Refill  . acetaminophen-codeine (TYLENOL #3) 300-30 MG tablet Take 1 tablet by mouth every 6 (six) hours as needed for moderate pain. 30 tablet 0  . ALPRAZolam (XANAX) 1 MG tablet Take 1 tablet (1 mg total) by mouth at bedtime as needed for anxiety. 30 tablet 0  . atorvastatin (LIPITOR) 10 MG tablet Take 1 tablet (10 mg total) by mouth daily. 90 tablet 3  . capecitabine (XELODA) 500 MG tablet 4 tabs in morning and 3 tabs in evening. Take on days 1-7 & 15-21 of each 28 day cycle 98 tablet 1  . CINNAMON PO Take 1 tablet by mouth daily.     . clindamycin (CLINDAGEL) 1 % gel Apply topically 2 (two) times daily. 60 g 1  . doxycycline (VIBRA-TABS) 100 MG tablet Take 1 tablet (100 mg total) by mouth 2 (two) times daily. 60 tablet 2  . fluticasone (FLONASE) 50 MCG/ACT nasal spray Place 1 spray into both nostrils daily. (Patient taking differently: Place 1 spray into both nostrils daily as needed for  allergies. ) 16 g 2  . insulin NPH-regular Human (NOVOLIN 70/30) (70-30) 100 UNIT/ML injection Inject 25 Units into the skin 2 (two) times daily with a meal.     . levothyroxine (SYNTHROID, LEVOTHROID) 112 MCG tablet Take 112 mcg by mouth daily before breakfast.    . methylPREDNISolone (MEDROL DOSEPAK) 4 MG TBPK tablet Take as directed 21 tablet 0  . Omega-3 Fatty Acids (FISH OIL) 1000 MG CAPS Take 1,000 mg by mouth daily.     . polyethylene glycol (MIRALAX / GLYCOLAX) packet Take 17 g by mouth daily. 14 each 0  . propranolol (INDERAL) 10 MG tablet TAKE 1 TABLET BY MOUTH EVERY DAY 90 tablet 0  . TURMERIC PO Take 1 capsule by mouth daily.      No current facility-administered medications for this visit.     PHYSICAL EXAMINATION: ECOG PERFORMANCE STATUS: {CHL ONC ECOG [phone]}  There were no vitals filed for this visit. There were no vitals filed for this visit.  GENERAL:alert, no distress and comfortable SKIN: skin color, texture, turgor are normal, no rashes or significant lesions EYES: normal, Conjunctiva are pink and non-injected,  sclera clear OROPHARYNX:no exudate, no erythema and lips, buccal mucosa, and tongue normal  NECK: supple, thyroid normal size, non-tender, without nodularity LYMPH:  no palpable lymphadenopathy in the cervical, axillary or inguinal LUNGS: clear to auscultation and percussion with normal breathing effort HEART: regular rate & rhythm and no murmurs and no lower extremity edema ABDOMEN:abdomen soft, non-tender and normal bowel sounds Musculoskeletal:no cyanosis of digits and no clubbing  NEURO: alert & oriented x 3 with fluent speech, no focal motor/sensory deficits  LABORATORY DATA:  I have reviewed the data as listed CBC Latest Ref Rng & Units 10/06/2017 09/22/2017 09/08/2017  WBC 3.9 - 10.3 K/uL 8.4 8.3 9.6  Hemoglobin 11.6 - 15.9 g/dL 11.2(L) 10.4(L) 9.2(L)  Hematocrit 34.8 - 46.6 % 36.1 33.5(L) 31.4(L)  Platelets 145 - 400 K/uL 221 254 318      CMP Latest Ref Rng & Units 10/06/2017 09/22/2017 09/08/2017  Glucose 70 - 99 mg/dL 130(H) 193(H) 243(H)  BUN 6 - 20 mg/dL '6 7 7  '$ Creatinine 0.44 - 1.00 mg/dL 0.70 0.70 0.75  Sodium 135 - 145 mmol/L 140 140 140  Potassium 3.5 - 5.1 mmol/L 3.5 3.9 4.4  Chloride 98 - 111 mmol/L 106 104 104  CO2 22 - 32 mmol/L '26 28 27  '$ Calcium 8.9 - 10.3 mg/dL 8.4(L) 8.3(L) 8.6  Total Protein 6.5 - 8.1 g/dL 7.1 6.8 7.1  Total Bilirubin 0.3 - 1.2 mg/dL 0.5 0.5 0.2  Alkaline Phos 38 - 126 U/L 169(H) 179(H) 207(H)  AST 15 - 41 U/L 14(L) 14(L) 14  ALT 0 - 44 U/L '12 13 8    '$ CEA: 05/31/2017:353.3 07/07/2017: 494.58 08/10/2017: 754.17 08/25/2017: 862.87 (began treatment) 09/22/2017: 61.0 10/06/2017: 28.92  PATHOLOGY REPORT:  Diagnosis 06/11/2017 Liver, needle/core biopsy - METASTATIC ADENOCARCINOMA, CONSISTENT WITH COLONIC PRIMARY.    RADIOGRAPHIC STUDIES: I have personally reviewed the radiological images as listed and agreed with the findings in the report. No results found.   ASSESSMENT & PLAN: 47 y.o.[place] female, with past medical history of diabetes, hypertension, presented with left lower quadrant abdominal pain, hematochezia, fatigue and weight loss  1. Sigmoid adenocarcinoma, with abdominopelvic adenopathy andhepatic metastasis, stage IV, MSI-stable 2. Anemia, iron deficiency  3. DM, HTN, HL 4. Weight loss 5. Genetics 6. Financial and social support 7. Anxiety, depression 8. Goals of care discussion  9. Rash face, chest, scalp, back  10. Unwitnessed ? syncopal episode 09/06/17 with warmth and dizziness  11. Periorbital edema and facial erythema, possibly related to vectibix   No problem-specific Assessment & Plan notes found for this encounter.   No orders of the defined types were placed in this encounter.  All questions were answered. The patient knows to call the clinic with any problems, questions or concerns. No barriers to learning was detected. I spent {CHL ONC  TIME VISIT - GTXMI:[id number]} counseling the patient face to face. The total time spent in the appointment was {CHL ONC TIME VISIT - GNOIB:[id number]} and more than 50% was on counseling and review of test results     Alla Feeling, NP 10/26/17

## 2017-10-26 NOTE — Telephone Encounter (Signed)
Received call from pt stating that she needs to cancel chemo appt today due to not knowing if she has insurance.  She is trying to f/u with [employer] to see if her policy is in place.  Message to DR Feng/RN & to infusion room to cancel.  Message to Dee/Managed Care to help f/u.

## 2017-10-29 ENCOUNTER — Encounter: Payer: Self-pay | Admitting: Pharmacy Technician

## 2017-10-29 ENCOUNTER — Telehealth: Payer: Self-pay

## 2017-10-29 ENCOUNTER — Other Ambulatory Visit: Payer: Self-pay | Admitting: Hematology

## 2017-10-29 NOTE — Telephone Encounter (Signed)
Jess, do you have any idea about her Xeloda cost if she has no insurance? If no financial assistance available, I will switch her to 5-fu, but she would need a port for that. I will give her a call.   Rob, please let me know when you have drug replacement done, thanks much.  Truitt Merle MD

## 2017-10-29 NOTE — Telephone Encounter (Signed)
Spoke with patient to see if she will be able to afford Xeloda.  She is unsure it would depend on the cost.  I think she is willing to switch to 5FU pump if necessary.  If anyone has information on the actual cost of Xeloda please let me or the patient know.

## 2017-10-29 NOTE — Telephone Encounter (Signed)
-----   Message from Truitt Merle, MD sent at 10/29/2017 10:48 AM EDT ----- Regarding: RE: Re Schedule Rob,   I don't think she needs neulasta or emend. Did you apply drug replacement for her vectibix? When would you know if she will get this?  Malachy Mood, please check with pt if she can afford Xeloda? If not, is she willing to change to 5-fu pump?   I will be happy to see her sooner, but not sure when she can restart treatment, depends on the above situations   Thanks  Krista Blue  ----- Message ----- From: Hazle Nordmann, CPhT Sent: 10/29/2017   9:40 AM EDT To: Truitt Merle, MD Subject: Re Schedule                                    Dr Burr Medico,  Loren Racer called me to talk about insurance and drug assistance. I explained to her that I had submitted applications for Neulasta and Emend. She was concerned that her next appointment was not until 11/05/17 for infusion. She had a question to you, Does it matter that she is scheduled for the 11/05/17 for should she be re scheduled sooner?  Thanks Rob

## 2017-10-29 NOTE — Progress Notes (Signed)
I received an in basket message from [place] indicating that the patient no longer has insurance. I submitted a drug assistance application for Neulasta and Emend. The patient had a question for Dr. Burr Medico about the next infusion appointment being scheduled for 11/05/17. She wanted to know if it is okay to be scheduled that far out or if she should be re scheduled sooner? I sent an in basket to Dr. Burr Medico about the patient's concern.

## 2017-10-30 ENCOUNTER — Encounter: Payer: Self-pay | Admitting: Pharmacy Technician

## 2017-10-30 NOTE — Progress Notes (Unsigned)
The patient is approved for drug assistance by [employer] for Vectibix. Enrollment is based on self pay until 10/28/18. Drug replacement will begin on DOS 08/25/17.

## 2017-10-30 NOTE — Progress Notes (Signed)
This note is to correct a previous encounter. I applied for drug assistance to [employer] for Vectibix not Neulasta. The application was faxed on 10/28/17.

## 2017-11-02 ENCOUNTER — Telehealth: Payer: Self-pay | Admitting: Pharmacist

## 2017-11-02 DIAGNOSIS — C19 Malignant neoplasm of rectosigmoid junction: Secondary | ICD-10-CM

## 2017-11-02 MED ORDER — CAPECITABINE 500 MG PO TABS: ORAL_TABLET | ORAL | 0 refills | Status: DC

## 2017-11-02 MED FILL — XELODA 500 MG TABLET: 500 | 84 days supply | Qty: 294 | Fill #0

## 2017-11-02 NOTE — Telephone Encounter (Signed)
Oral Oncology Pharmacist Encounter  Received new referral for Xeloda (capecitabine) for the treatment of metastatic, Kras/Nras WT rectal cancerin conjunction with panitumumab, planned durationuntil disease progression or unacceptable toxicity. Original start date: 08/25/17 Patient remains stable on 2019m in AM & 15019min PM for 1 week on, 1 week off with concurrent infusional panitumumab  Now without prescription insurance coverage in need of prescription refill processing.  Labs from 10/06/2017 assessed, OK for continued treatment.  Current medication list in Epic reviewed, no DDIs with Xeloda identified.  [facility] with help of previously secured [facility] reviewed administration, dosing, side effects, monitoring, drug-food interactions, safe handling, storage, and disposal.  In short, patient will continue taking Xeloda 50036mablets, 4 tablets (2000m57my mouth in AM and 3 tabs (1500mg74m mouth in PM, within 30 minutes of finishing meals, on days 1-7 and 15-21 of each 28 day cycle.   Vectibix will continue to be infused at 6 mg/kg every 2 weeks, next dose planned for 11/05/2017.  Patient is experiencing manageable skin toxicity due to Vectibix, managed with clindamycin topical gel and docycline tabs. Patient is agreeable to continue current treatment regimen and current dose and frequency.  She is in need of refill of clindamycin topical gel, and [facility].  [facility] at no out of pocket cost as [facility] will follow-up with IV replacement specialist about Vectibix assistance approval through [employer] questions answered. Ms. KnighBirnieessed understanding and appreciation.  She  knows to call the office with any additional questions or concerns.  JesseJohny DrillingrmD, BCPS, BCOP  11/02/2017 12:51 PM Oral Oncology Clinic [phone]

## 2017-11-02 NOTE — Progress Notes (Signed)
[name]  [phone] [phone]  Clinic Follow up Note   Patient Care Team: Esaw Grandchild, NP as PCP - General (Family Medicine) Delrae Rend, MD as Consulting Physician (Endocrinology) Truitt Merle, MD as Consulting Physician (Hematology) Alla Feeling, NP as Nurse Practitioner (Nurse Practitioner) Clarene Essex, MD as Consulting Physician (Gastroenterology)   Date of Service:  11/05/2017   CHIEF COMPLAIN: f/u metastatic sigmoid colon cancer  SUMMARY OF ONCOLOGIC HISTORY: Oncology History   Cancer Staging Malignant neoplasm of rectosigmoid junction [facility]) Staging form: Colon and Rectum, AJCC 8th Edition - Clinical stage from 06/11/2017: Stage IVA (cTX, cN1b, pM1a) - Signed by Truitt Merle, MD on 06/15/2017       Malignant neoplasm of rectosigmoid junction ([place])   05/30/2017 Imaging    CT AP IMPRESSION: 1. 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 Abdominopelvic adenopathy. Rectosigmoid mass with suggestion of partial obstruction as evidenced by large colonic stool burden more proximally. 2.  Aortic Atherosclerosis (ICD10-I70.0).  This is age advanced.    05/31/2017 Tumor Marker    CEA 353.3 (elevated) AFP 4.5 (normal)    05/31/2017 Initial Biopsy    Diagnosis Colon, biopsy, sigmoid - INVASIVE ADENOCARCINOMA - SEE COMMENT    05/31/2017 Imaging    CT CHEST IMPRESSION: 1. No evidence of metastatic disease in the chest. 2. No acute findings.  Aortic Atherosclerosis (ICD10-I70.0).     05/31/2017 Procedure    Colonoscopy per Dr. Watt Climes Findings: An infiltrative and ulcerated partially obstructing medium-sized mass was found in the recto-sigmoid colon. The mass was circumferential. The mass measured four cm in length. No bleeding was present.   Impression - One small polyp in the rectum. - The examination was otherwise normal. - Malignant partially obstructing tumor in the  recto-sigmoid colon. Biopsied. - One medium polyp in the proximal sigmoid colon. - The examination was otherwise normal. - Internal hemorrhoids.    06/03/2017 Initial Diagnosis    Malignant neoplasm of transverse colon ([place])    06/11/2017 Cancer Staging    Staging form: Colon and Rectum, AJCC 8th Edition - Clinical stage from 06/11/2017: Stage IVA (cTX, cN1b, pM1a) - Signed by Truitt Merle, MD on 06/15/2017    06/11/2017 Pathology Results    Liver biopsy confirmed metastatic colon cancer    07/23/2017 Imaging    CT CAP IMPRESSION: 1. Mild progression of hepatic metastasis. 2. Similar rectosigmoid primary with abdominopelvic nodal metastasis. 3.  No acute process or evidence of metastatic disease in the chest. 4. Aortic Atherosclerosis (ICD10-I70.0). Coronary artery atherosclerosis. This is age advanced. 5. Proximal colonic constipation, again suggesting a component of partial obstruction at the primary site. 6. Mild ascending aortic dilatation at 4.1 cm.    08/25/2017 -  Chemotherapy    -Xeloda '1500mg'$  BID 1 week on and 1 week off starting 08/25/17 -Vectibix every 2 weeks starting 08/25/17    CURRENT THERAPY:   -Xeloda '1500mg'$  BID 1 week on and 1 week off starting 08/25/17. Increased to '2000mg'$  in the AM and '1500mg'$  in the PM starting 09/08/17.  -Vectibix every 2 weeks starting 08/25/17  INTERVAL HISTORY: Beverly Schwartz returns today for follow-up. She is here with her mother. She is Schwartz well. She complains of multiple painful swollen LN in her cervical area. Denies dental problems, sore throat, or recent facial skin infections.   Her skin rash was worsened when she increased Xeloda to 4 and 3 pills/day.  REVIEW OF SYSTEMS:  Constitutional: Denies fevers, chills or  abnormal weight loss  Eyes: Denies blurriness of vision Ears, nose, mouth, throat, and face: Denies mucositis or sore throat Respiratory: Denies cough, dyspnea or wheezes Cardiovascular: Denies palpitation, chest discomfort or lower  extremity swelling Gastrointestinal: Intermittent nausea and abdominal cramps, mild intermittent hematochezia, she is on MiraLAX for constipation, stable  Skin: (+) rash on face, scalp and chest from Vectibix, improving Lymphatics: Denies new lymphadenopathy or easy bruising (+) painful LN in cervical area Neurological:Denies numbness, tingling or new weaknesses Behavioral/Psych: Mood is stable, no new changes  All other systems were reviewed with the patient and are negative.  MEDICAL HISTORY:  Past Medical History:  Diagnosis Date  . Anxiety   . Cancer ([place])    colon, liver  . Chest pain   . Colon cancer ([place])   . Complication of anesthesia   . Depression   . Diabetes mellitus   . Dizziness   . Family history of breast cancer   . Family history of stomach cancer   . Hyperlipidemia   . Hypertension   . Hypothyroidism   . Obesity   . PONV (postoperative nausea and vomiting)   . Tachycardia     SURGICAL HISTORY: Past Surgical History:  Procedure Laterality Date  . FLEXIBLE SIGMOIDOSCOPY N/A 05/31/2017   Procedure: FLEXIBLE SIGMOIDOSCOPY;  Surgeon: Clarene Essex, MD;  Location: [facility] END[facility];  Service: Endoscopy;  Laterality: N/A;  . WISDOM TOOTH EXTRACTION     age 42's    I have reviewed the social history and family history with the patient and they are unchanged from previous note.  ALLERGIES:  is allergic to bupropion and amoxicillin.  MEDICATIONS:  Current Outpatient Medications  Medication Sig Dispense Refill  . acetaminophen-codeine (TYLENOL #3) 300-30 MG tablet Take 1 tablet by mouth every 6 (six) hours as needed for moderate pain. 30 tablet 0  . ALPRAZolam (XANAX) 1 MG tablet Take 1 tablet (1 mg total) by mouth at bedtime as needed for anxiety. 30 tablet 0  . atorvastatin (LIPITOR) 10 MG tablet Take 1 tablet (10 mg total) by mouth daily. 90 tablet 3  . capecitabine (XELODA) 500 MG tablet Take 4 tablets ('2000mg'$ ) in AM & and 3 tabs ('1500mg'$ ) in PM, within 26mn of  finishing food. Take on days 1-7 & 15-21 of each 28 day cycle 294 tablet 0  . CINNAMON PO Take 1 tablet by mouth daily.     . clindamycin (CLINDAGEL) 1 % gel Apply topically 2 (two) times daily. 60 g 1  . doxycycline (VIBRA-TABS) 100 MG tablet Take 1 tablet (100 mg total) by mouth 2 (two) times daily. 60 tablet 2  . fluticasone (FLONASE) 50 MCG/ACT nasal spray Place 1 spray into both nostrils daily. (Patient taking differently: Place 1 spray into both nostrils daily as needed for allergies. ) 16 g 2  . insulin NPH-regular Human (NOVOLIN 70/30) (70-30) 100 UNIT/ML injection Inject 25 Units into the skin 2 (two) times daily with a meal.     . levothyroxine (SYNTHROID, LEVOTHROID) 112 MCG tablet Take 112 mcg by mouth daily before breakfast.    . methylPREDNISolone (MEDROL DOSEPAK) 4 MG TBPK tablet Take as directed 21 tablet 0  . Omega-3 Fatty Acids (FISH OIL) 1000 MG CAPS Take 1,000 mg by mouth daily.     . polyethylene glycol (MIRALAX / GLYCOLAX) packet Take 17 g by mouth daily. 14 each 0  . propranolol (INDERAL) 10 MG tablet TAKE 1 TABLET BY MOUTH EVERY DAY 90 tablet 0  . TURMERIC PO Take  1 capsule by mouth daily.      No current facility-administered medications for this visit.     PHYSICAL EXAMINATION:  ECOG PERFORMANCE STATUS: 1 - Symptomatic but completely ambulatory  Vitals:   11/05/17 1048  BP: (!) 145/97  Pulse: 80  Resp: 18  Temp: 98.2 F (36.8 C)  SpO2: 100%   Filed Weights   11/05/17 1048  Weight: 154 lb 14.4 oz (70.3 kg)   GENERAL:alert, no distress and comfortable SKIN: skin color, texture, turgor are normal, no significant lesions. Moderate diffuse papular skin rash on face without significant skin erythema, more on right. (+) Moderate diffuse small acne like rash on face, upper chest, scalp from Vectibix EYES: normal, Conjunctiva are pink and non-injected, sclera clear OROPHARYNX:no exudate, no erythema and lips, buccal mucosa, and tongue normal  NECK: supple, thyroid  normal size, non-tender, without nodularity LYMPH:  no palpable lymphadenopathy axillary (+) enlarged 0.5-1.0cm LNs in left low posterior neck and one in left inguinal areas LUNGS: clear to auscultation and percussion with normal breathing effort HEART: regular rate & rhythm and no murmurs and no lower extremity edema ABDOMEN:abdomen soft, (+) 2-3 cm hepatomegaly. Mild abdominal tenderness.  Musculoskeletal:no cyanosis of digits and no clubbing  NEURO: alert & oriented x 3 with fluent speech, no focal motor/sensory deficits  LABORATORY DATA:  I have reviewed the data as listed CBC Latest Ref Rng & Units 11/05/2017 10/06/2017 09/22/2017  WBC 3.9 - 10.3 K/uL 11.1(H) 8.4 8.3  Hemoglobin 11.6 - 15.9 g/dL 11.7 11.2(L) 10.4(L)  Hematocrit 34.8 - 46.6 % 38.0 36.1 33.5(L)  Platelets 145 - 400 K/uL 248 221 254     CMP Latest Ref Rng & Units 11/05/2017 10/06/2017 09/22/2017  Glucose 70 - 99 mg/dL 128(H) 130(H) 193(H)  BUN 6 - 20 mg/dL '8 6 7  '$ Creatinine 0.44 - 1.00 mg/dL 0.70 0.70 0.70  Sodium 135 - 145 mmol/L 142 140 140  Potassium 3.5 - 5.1 mmol/L 3.8 3.5 3.9  Chloride 98 - 111 mmol/L 108 106 104  CO2 22 - 32 mmol/L '24 26 28  '$ Calcium 8.9 - 10.3 mg/dL 8.0(L) 8.4(L) 8.3(L)  Total Protein 6.5 - 8.1 g/dL 7.2 7.1 6.8  Total Bilirubin 0.3 - 1.2 mg/dL 0.5 0.5 0.5  Alkaline Phos 38 - 126 U/L 185(H) 169(H) 179(H)  AST 15 - 41 U/L 16 14(L) 14(L)  ALT 0 - 44 U/L '12 12 13    '$ Tumor Markers CEA Results for ALLIANA, MCAULIFF (MRN [id number]) as of 11/02/2017 12:38  Ref. Range 07/07/2017 10:47 08/10/2017 14:54 08/25/2017 09:01 09/22/2017 14:34 10/06/2017 12:44  CEA ([facility]) Latest Ref Range: 0.00 - 5.00 ng/mL 494.58 (H) 754.17 (H) 862.87 (H) 61.00 (H) 28.92 (H)    PATHOLOGY REPORT:  Diagnosis 06/11/2017 Liver, needle/core biopsy - METASTATIC ADENOCARCINOMA, CONSISTENT WITH COLONIC PRIMARY.   RADIOGRAPHIC STUDIES: I have personally reviewed the radiological images as listed and agreed with the findings  in the report.   CT CAP w contrast, 07/23/2017 IMPRESSION 1. Mild progression of hepatic metastasis. 2. Similar rectosigmoid primary with abdominopelvic nodal metastasis. 3.  No acute process or evidence of metastatic disease in the chest. 4. Aortic Atherosclerosis (ICD10-I70.0). Coronary artery atherosclerosis. This is age advanced. 5. Proximal colonic constipation, again suggesting a component of partial obstruction at the primary site. 6. Mild ascending aortic dilatation at 4.1 cm.   ASSESSMENT & PLAN:  47 y.o. [place] female, with past medical history of diabetes, hypertension, presented with left lower quadrant abdominal pain, hematochezia, fatigue and  weight loss  1. Sigmoid adenocarcinoma, with abdominopelvic adenopathy and hepatic metastasis, stage IV, MSI-stable  -We previously reviewed medical records including imaging and pathology with the patient and family in detail. She has what appears to be metastatic sigmoid adenocarcinoma to abdominopelvic lymph nodes and liver.  -I previously discussed her liver biopsy results with patient and her mother, which confirmed metastatic colon cancer. I have requested her biopsy to be sent out for genomic testing foundation one -Given her diffuse liver metastasis, her cancer unfortunately is incurable.  We discussed the goal of therapy is palliative to prolong her life and preserve her quality of life. -Patient declined intensive chemotherapy, such as FOLFOX or FOLFIRI, due to the concern of side effects.  After multiple discussions, back-and-forth, she finally agreed with Xeloda and vectibix as her first line therapy.  -She started Xeloda and Vectibix on 08/25/17, tolerating well with mild rash.  -I previously advised her to increase to full dose '2000mg'$  in the AM and '1500mg'$  in the PM, one week on and one week off. She agreed to try, started 09/08/2017. Her rash worsened with higher dose, but recovered well. I encouraged her to continue '2000mg'$  AM and  '1500mg'$  PM. -Labs reviewed, Hg at 11.7, glucose at 128, alk phos at 185, overall adequate to proceed with Vectibix and Xeloda regimen.  -She has been denied for medicaid and disability.  Her insurance expired about 2 weeks ago, and she missed 1 cycle treatment then.  We are able to replace her vectibix, and secured financial assistance for Xeloda, she will restart today -f/u, labs and panitumumab in 2 and 4 weeks  -CT CAP on 9/10 or 9/11   2. 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   -I previously recommended to intravenous iron, She declined. She has started oral iron. Due to constipation she is only taking 1 a day.  -I discussed oral iron may not be enough and discussed the option of IV iron. She will think about it.   3. DM, HTN, HL -We previously discussed possible effects of chemotherapy and steroid use on her DM, she will need to check BG closely and possibly adjust insulin if hyperglycemia is not well controlled; we reviewed the importance of tight glucose control while on chemotherapy. - I recommend she check her random glucose levels at home twice a day and record number. She should see her endocrinologist, Dr. Buddy Duty, if her levels remain high.   4. Weight loss  -She was previously given referral to nutrition to follow while on chemotherapy. We reviewed importance of adequate nutrition in order to tolerate chemotherapy  -I previously advised the patient to start consuming nutritional supplements to aid with weight loss.  -she has gained some weight back   5.  Genetics -Due to her young age and personal history of colon cancer, she qualifies for genetics counseling to rule out inheritable cancer syndrome such as Lynch syndrome.  She agrees, referral was made   6. Financial and Social issues, Anxiety, depression -She is a single parent with 40 year old son; her child's father is involved. Patient currently lives  with her mother and has social support. She has met our SW and She follows up with Social Worker Hollice Espy -I will refer the patient to a counselor to aid with her anxiety regarding chemotherapy.  -She hasn't tried mirtazapine in the past and Wellbutrin in the past. She is currently on Xanax  -  She recently applied for medicaid and disability and was denied. We will continue Xeloda for now and may need to switch to 5-FU Pump.  -she unfortunately lost her insurance, she is currently re-applying  7. Goal of care discussion  -We again discussed the incurable nature of her cancer, and the overall poor prognosis, especially if she does not have good response to chemotherapy or progress on chemo -The patient understands the goal of care is palliative. -she is full code for now   8. Rash on face, chest, scalp and back, moderate -secondary to Panitumumab -She will continue using hydrocortisone cream and clindamycin gel twice daily -I previously recommend oral antibiotic Doxycycline 100 mg BID to help reduce her rash.  -improved after chemo break   9. Multiple enlarged LN in cervical and inguinal areas -These were not there during last visit. No evidence of throat or groin infections.  -I will monitor, if worse, will include neck in next restaging scan    PLAN: -Continue Xeloda and panitumumab today and every 2 weeks  -CT CAP with contrast on 9/10 or 9/11  -f/u in 2 weeks   All questions were answered. The patient knows to call the clinic with any problems, questions or concerns. No barriers to learning was detected.  I spent 20 minutes counseling the patient face to face. The total time spent in the appointment was 25 minutes and more than 50% was on counseling and review of test results  I, Noor Dweik am acting as scribe for Dr. Truitt Merle.  I have reviewed the above documentation for accuracy and completeness, and I agree with the above.    Truitt Merle, MD 11/05/2017

## 2017-11-03 ENCOUNTER — Other Ambulatory Visit: Payer: Self-pay | Admitting: Adult Health

## 2017-11-03 ENCOUNTER — Other Ambulatory Visit: Payer: Self-pay | Admitting: Hematology

## 2017-11-04 ENCOUNTER — Other Ambulatory Visit: Payer: Self-pay

## 2017-11-04 ENCOUNTER — Ambulatory Visit: Payer: Self-pay

## 2017-11-04 ENCOUNTER — Encounter: Payer: Self-pay | Admitting: Pharmacy Technician

## 2017-11-04 NOTE — Progress Notes (Signed)
The patient is approved for drug assistance by [employer] for [employer]. Enrollment is based on self pay and is effective until 11/04/18. Currently there are no orders for [employer]. This approval is a proactive measure in the event it is needed.

## 2017-11-05 ENCOUNTER — Telehealth: Payer: Self-pay | Admitting: Hematology

## 2017-11-05 ENCOUNTER — Inpatient Hospital Stay: Payer: Self-pay

## 2017-11-05 ENCOUNTER — Inpatient Hospital Stay: Payer: Self-pay | Attending: Hematology

## 2017-11-05 ENCOUNTER — Encounter: Payer: Self-pay | Admitting: Hematology

## 2017-11-05 ENCOUNTER — Inpatient Hospital Stay (HOSPITAL_BASED_OUTPATIENT_CLINIC_OR_DEPARTMENT_OTHER): Payer: Self-pay | Admitting: Hematology

## 2017-11-05 ENCOUNTER — Inpatient Hospital Stay: Payer: Self-pay | Admitting: Nutrition

## 2017-11-05 VITALS — BP 145/97 | HR 80 | Temp 98.2°F | Resp 18 | Ht 65.0 in | Wt 154.9 lb

## 2017-11-05 DIAGNOSIS — E119 Type 2 diabetes mellitus without complications: Secondary | ICD-10-CM | POA: Insufficient documentation

## 2017-11-05 DIAGNOSIS — E039 Hypothyroidism, unspecified: Secondary | ICD-10-CM | POA: Insufficient documentation

## 2017-11-05 DIAGNOSIS — D500 Iron deficiency anemia secondary to blood loss (chronic): Secondary | ICD-10-CM

## 2017-11-05 DIAGNOSIS — F329 Major depressive disorder, single episode, unspecified: Secondary | ICD-10-CM

## 2017-11-05 DIAGNOSIS — R634 Abnormal weight loss: Secondary | ICD-10-CM | POA: Insufficient documentation

## 2017-11-05 DIAGNOSIS — C19 Malignant neoplasm of rectosigmoid junction: Secondary | ICD-10-CM | POA: Insufficient documentation

## 2017-11-05 DIAGNOSIS — Z79899 Other long term (current) drug therapy: Secondary | ICD-10-CM | POA: Insufficient documentation

## 2017-11-05 DIAGNOSIS — I10 Essential (primary) hypertension: Secondary | ICD-10-CM

## 2017-11-05 DIAGNOSIS — C787 Secondary malignant neoplasm of liver and intrahepatic bile duct: Secondary | ICD-10-CM | POA: Insufficient documentation

## 2017-11-05 DIAGNOSIS — C187 Malignant neoplasm of sigmoid colon: Secondary | ICD-10-CM

## 2017-11-05 DIAGNOSIS — Z5112 Encounter for antineoplastic immunotherapy: Secondary | ICD-10-CM | POA: Insufficient documentation

## 2017-11-05 DIAGNOSIS — R599 Enlarged lymph nodes, unspecified: Secondary | ICD-10-CM

## 2017-11-05 DIAGNOSIS — F419 Anxiety disorder, unspecified: Secondary | ICD-10-CM | POA: Insufficient documentation

## 2017-11-05 LAB — CMP (CANCER CENTER ONLY)
ALT: 12 U/L (ref 0–44)
AST: 16 U/L (ref 15–41)
Albumin: 3.2 g/dL — ABNORMAL LOW (ref 3.5–5.0)
Alkaline Phosphatase: 185 U/L — ABNORMAL HIGH (ref 38–126)
Anion gap: 10 (ref 5–15)
BUN: 8 mg/dL (ref 6–20)
CO2: 24 mmol/L (ref 22–32)
Calcium: 8 mg/dL — ABNORMAL LOW (ref 8.9–10.3)
Chloride: 108 mmol/L (ref 98–111)
Creatinine: 0.7 mg/dL (ref 0.44–1.00)
GFR, Est AFR Am: >60 mL/min (ref >60)
GFR, Estimated: >60 mL/min (ref >60)
Glucose, Bld: 128 mg/dL — ABNORMAL HIGH (ref 70–99)
Potassium: 3.8 mmol/L (ref 3.5–5.1)
Sodium: 142 mmol/L (ref 135–145)
Total Bilirubin: 0.5 mg/dL (ref 0.3–1.2)
Total Protein: 7.2 g/dL (ref 6.5–8.1)

## 2017-11-05 LAB — CBC WITH DIFFERENTIAL (CANCER CENTER ONLY)
Basophils Absolute: 0.1 10*3/uL (ref 0.0–0.1)
Basophils Relative: 1 %
Eosinophils Absolute: 0.8 10*3/uL — ABNORMAL HIGH (ref 0.0–0.5)
Eosinophils Relative: 7 %
HCT: 38 % (ref 34.8–46.6)
Hemoglobin: 11.7 g/dL (ref 11.6–15.9)
Lymphocytes Relative: 19 %
Lymphs Abs: 2.1 10*3/uL (ref 0.9–3.3)
MCH: 25.3 pg (ref 25.1–34.0)
MCHC: 30.8 g/dL — ABNORMAL LOW (ref 31.5–36.0)
MCV: 82.3 fL (ref 79.5–101.0)
Monocytes Absolute: 0.5 10*3/uL (ref 0.1–0.9)
Monocytes Relative: 4 %
Neutro Abs: 7.6 10*3/uL — ABNORMAL HIGH (ref 1.5–6.5)
Neutrophils Relative %: 69 %
Platelet Count: 248 10*3/uL (ref 145–400)
RBC: 4.62 MIL/uL (ref 3.70–5.45)
RDW: 24.2 % — ABNORMAL HIGH (ref 11.2–14.5)
WBC Count: 11.1 10*3/uL — ABNORMAL HIGH (ref 3.9–10.3)

## 2017-11-05 LAB — CEA (IN HOUSE-CHCC): CEA (CHCC-In House): 66.63 ng/mL — ABNORMAL HIGH (ref 0.00–5.00)

## 2017-11-05 LAB — IRON AND TIBC
Iron: 295 ug/dL — ABNORMAL HIGH (ref 41–142)
Saturation Ratios: 76 % — ABNORMAL HIGH (ref 21–57)
TIBC: 390 ug/dL (ref 236–444)
UIBC: 95 ug/dL

## 2017-11-05 LAB — MAGNESIUM: Magnesium: 1.9 mg/dL (ref 1.7–2.4)

## 2017-11-05 LAB — FERRITIN: Ferritin: 34 ng/mL (ref 11–307)

## 2017-11-05 MED ORDER — SODIUM CHLORIDE 0.9 % IV SOLN
6.0000 mg/kg | Freq: Once | INTRAVENOUS | Status: AC
  Administered 2017-11-05: 400 mg via INTRAVENOUS
  Filled 2017-11-05: qty 20

## 2017-11-05 MED ORDER — SODIUM CHLORIDE 0.9 % IV SOLN
Freq: Once | INTRAVENOUS | Status: AC
  Administered 2017-11-05: 12:00:00 via INTRAVENOUS
  Filled 2017-11-05: qty 250

## 2017-11-05 NOTE — Progress Notes (Signed)
Nutrition follow-up completed with patient during infusion for metastatic colon cancer. Weight has improved and was documented as 154 pounds on August 15. This is improved from 146.5 pounds July 16. Patient reports increased fatigue however her appetite is much better. She is drinking boost and requesting coupons. No other nutrition impact symptoms.  Nutrition diagnosis: Inadequate oral intake improved.  Intervention: Educated patient to strive for smaller more frequent meals and snacks with adequate calories and protein. Encourage patient to consume boost as needed and provided additional coupons. Teach back method used.  Monitoring, evaluation, goals: Patient will tolerate adequate calories and protein for weight maintenance.  Next visit: We will follow as needed.  Patient has RD contact information for questions.  **Disclaimer: This note was dictated with voice recognition software. Similar sounding words can inadvertently be transcribed and this note may contain transcription errors which may not have been corrected upon publication of note.**

## 2017-11-05 NOTE — Telephone Encounter (Signed)
Appts scheduled letter/calendar mailed to patient per 8/15 los

## 2017-11-05 NOTE — Patient Instructions (Signed)
[facility] Discharge Instructions for Patients Receiving Chemotherapy  Today you received the following chemotherapy agents: Vectibix.  To help prevent nausea and vomiting after your treatment, we encourage you to take your nausea medication as directed.   If you develop nausea and vomiting that is not controlled by your nausea medication, call the clinic.   BELOW ARE SYMPTOMS THAT SHOULD BE REPORTED IMMEDIATELY:  *FEVER GREATER THAN 100.5 F  *CHILLS WITH OR WITHOUT FEVER  NAUSEA AND VOMITING THAT IS NOT CONTROLLED WITH YOUR NAUSEA MEDICATION  *UNUSUAL SHORTNESS OF BREATH  *UNUSUAL BRUISING OR BLEEDING  TENDERNESS IN MOUTH AND THROAT WITH OR WITHOUT PRESENCE OF ULCERS  *URINARY PROBLEMS  *BOWEL PROBLEMS  UNUSUAL RASH Items with * indicate a potential emergency and should be followed up as soon as possible.  Feel free to call the clinic should you have any questions or concerns. The clinic phone number is [phone].  Please show the CHEMO ALERT CARD at check-in to the Emergency Department and triage nurse.   

## 2017-11-05 NOTE — Telephone Encounter (Signed)
Oral Oncology Patient Advocate Encounter  I confirmed with [place] that the Xeloda was picked up 11/05/17.  [place] Patient [place] Phone [phone] Fax [phone]

## 2017-11-11 NOTE — Progress Notes (Signed)
[place]  [phone] [phone]  Clinic Follow up Note   Patient Care Team: Esaw Grandchild, NP as PCP - General (Family Medicine) Delrae Rend, MD as Consulting Physician (Endocrinology) Truitt Merle, MD as Consulting Physician (Hematology) Alla Feeling, NP as Nurse Practitioner (Nurse Practitioner) Clarene Essex, MD as Consulting Physician (Gastroenterology)   Date of Service:  11/18/2017   CHIEF COMPLAIN: f/u metastatic sigmoid colon cancer  SUMMARY OF ONCOLOGIC HISTORY: Oncology History   Cancer Staging Malignant neoplasm of rectosigmoid junction [facility]) Staging form: Colon and Rectum, AJCC 8th Edition - Clinical stage from 06/11/2017: Stage IVA (cTX, cN1b, pM1a) - Signed by Truitt Merle, MD on 06/15/2017       Malignant neoplasm of rectosigmoid junction ([place])   05/30/2017 Imaging    CT AP IMPRESSION: 1. 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 Abdominopelvic adenopathy. Rectosigmoid mass with suggestion of partial obstruction as evidenced by large colonic stool burden more proximally. 2.  Aortic Atherosclerosis (ICD10-I70.0).  This is age advanced.    05/31/2017 Tumor Marker    CEA 353.3 (elevated) AFP 4.5 (normal)    05/31/2017 Initial Biopsy    Diagnosis Colon, biopsy, sigmoid - INVASIVE ADENOCARCINOMA - SEE COMMENT    05/31/2017 Imaging    CT CHEST IMPRESSION: 1. No evidence of metastatic disease in the chest. 2. No acute findings.  Aortic Atherosclerosis (ICD10-I70.0).     05/31/2017 Procedure    Colonoscopy per Dr. Watt Climes Findings: An infiltrative and ulcerated partially obstructing medium-sized mass was found in the recto-sigmoid colon. The mass was circumferential. The mass measured four cm in length. No bleeding was present.   Impression - One small polyp in the rectum. - The examination was otherwise normal. - Malignant partially obstructing tumor in the  recto-sigmoid colon. Biopsied. - One medium polyp in the proximal sigmoid colon. - The examination was otherwise normal. - Internal hemorrhoids.    06/03/2017 Initial Diagnosis    Malignant neoplasm of transverse colon ([place])    06/11/2017 Cancer Staging    Staging form: Colon and Rectum, AJCC 8th Edition - Clinical stage from 06/11/2017: Stage IVA (cTX, cN1b, pM1a) - Signed by Truitt Merle, MD on 06/15/2017    06/11/2017 Pathology Results    Liver biopsy confirmed metastatic colon cancer    07/23/2017 Imaging    CT CAP IMPRESSION: 1. Mild progression of hepatic metastasis. 2. Similar rectosigmoid primary with abdominopelvic nodal metastasis. 3.  No acute process or evidence of metastatic disease in the chest. 4. Aortic Atherosclerosis (ICD10-I70.0). Coronary artery atherosclerosis. This is age advanced. 5. Proximal colonic constipation, again suggesting a component of partial obstruction at the primary site. 6. Mild ascending aortic dilatation at 4.1 cm.    08/25/2017 -  Chemotherapy    -Xeloda '1500mg'$  BID 1 week on and 1 week off starting 08/25/17 -Vectibix every 2 weeks starting 08/25/17    CURRENT THERAPY:   -Xeloda '1500mg'$  BID 1 week on and 1 week off starting 08/25/17. Increased to '2000mg'$  in the AM and '1500mg'$  in the PM starting 09/08/17, dose was reduced to 1500 mg every 12 hours, 1 week on and one week of subsequently due to skin rash -Vectibix every 2 weeks starting 08/25/17  INTERVAL HISTORY:  Ms Steinke returns today for follow-up. She is here with her mother. She is feeling well. Her skin rash is improving.Her cervical LNs are enlarging and are a little tender. She has a mild soar throat with mild cough. Her scalp is  still peeling.   REVIEW OF SYSTEMS:  Constitutional: Denies fevers, chills or abnormal weight loss  Eyes: Denies blurriness of vision Ears, nose, mouth, throat, and face: Denies mucositis or sore throat Respiratory: Denies cough, dyspnea or wheezes Cardiovascular: Denies  palpitation, chest discomfort or lower extremity swelling Gastrointestinal: Intermittent nausea and abdominal cramps, mild intermittent hematochezia, she is on MiraLAX for constipation, stable  Skin: (+) rash on face, scalp and chest from Vectibix, improved a lot Lymphatics: Denies new lymphadenopathy or easy bruising (+) painful LN in cervical area, enlarging  Neurological:Denies numbness, tingling or new weaknesses Behavioral/Psych: Mood is stable, no new changes  All other systems were reviewed with the patient and are negative.  MEDICAL HISTORY:  Past Medical History:  Diagnosis Date  . Anxiety   . Cancer ([place])    colon, liver  . Chest pain   . Colon cancer ([place])   . Complication of anesthesia   . Depression   . Diabetes mellitus   . Dizziness   . Family history of breast cancer   . Family history of stomach cancer   . Hyperlipidemia   . Hypertension   . Hypothyroidism   . Obesity   . PONV (postoperative nausea and vomiting)   . Tachycardia     SURGICAL HISTORY: Past Surgical History:  Procedure Laterality Date  . FLEXIBLE SIGMOIDOSCOPY N/A 05/31/2017   Procedure: FLEXIBLE SIGMOIDOSCOPY;  Surgeon: Clarene Essex, MD;  Location: [facility];  Service: Endoscopy;  Laterality: N/A;  . WISDOM TOOTH EXTRACTION     age 47's    I have reviewed the social history and family history with the patient and they are unchanged from previous note.  ALLERGIES:  is allergic to bupropion and amoxicillin.  MEDICATIONS:  Current Outpatient Medications  Medication Sig Dispense Refill  . acetaminophen-codeine (TYLENOL #3) 300-30 MG tablet Take 1 tablet by mouth every 6 (six) hours as needed for moderate pain. 30 tablet 0  . ALPRAZolam (XANAX) 1 MG tablet Take 1 tablet (1 mg total) by mouth at bedtime as needed for anxiety. 30 tablet 0  . atorvastatin (LIPITOR) 10 MG tablet Take 1 tablet (10 mg total) by mouth daily. 90 tablet 3  . capecitabine (XELODA) 500 MG tablet Take 4 tablets  ('2000mg'$ ) in AM & and 3 tabs ('1500mg'$ ) in PM, within 70mn of finishing food. Take on days 1-7 & 15-21 of each 28 day cycle 294 tablet 0  . CINNAMON PO Take 1 tablet by mouth daily.     . clindamycin (CLINDAGEL) 1 % gel Apply topically 2 (two) times daily. 60 g 1  . doxycycline (VIBRA-TABS) 100 MG tablet Take 1 tablet (100 mg total) by mouth 2 (two) times daily. 60 tablet 2  . fluticasone (FLONASE) 50 MCG/ACT nasal spray Place 1 spray into both nostrils daily. (Patient taking differently: Place 1 spray into both nostrils daily as needed for allergies. ) 16 g 2  . insulin NPH-regular Human (NOVOLIN 70/30) (70-30) 100 UNIT/ML injection Inject 25 Units into the skin 2 (two) times daily with a meal.     . levothyroxine (SYNTHROID, LEVOTHROID) 112 MCG tablet Take 112 mcg by mouth daily before breakfast.    . methylPREDNISolone (MEDROL DOSEPAK) 4 MG TBPK tablet Take as directed 21 tablet 0  . Omega-3 Fatty Acids (FISH OIL) 1000 MG CAPS Take 1,000 mg by mouth daily.     . polyethylene glycol (MIRALAX / GLYCOLAX) packet Take 17 g by mouth daily. 14 each 0  . propranolol (INDERAL) 10  MG tablet TAKE 1 TABLET BY MOUTH EVERY DAY 90 tablet 0  . TURMERIC PO Take 1 capsule by mouth daily.      No current facility-administered medications for this visit.     PHYSICAL EXAMINATION:  ECOG PERFORMANCE STATUS: 1 - Symptomatic but completely ambulatory  Vitals:   11/18/17 1119  BP: 127/86  Pulse: 86  Resp: 18  Temp: 98.4 F (36.9 C)  SpO2: 100%   Filed Weights   11/18/17 1119  Weight: 154 lb 8 oz (70.1 kg)   GENERAL:alert, no distress and comfortable SKIN: skin color, texture, turgor are normal, no significant lesions. Mild diffuse dry skin and skin erythema on her face and neck, (+) Moderate diffuse small acne like rash on upper chest, scalp from Vectibix EYES: normal, Conjunctiva are pink and non-injected, sclera clear OROPHARYNX:no exudate, no erythema and lips, buccal mucosa, and tongue normal  NECK:  supple, thyroid normal size, non-tender, without nodularity LYMPH:  no palpable lymphadenopathy axillary (+) enlarged 0.5-1.0cm LNs in left low posterior neck and one in left inguinal areas LUNGS: clear to auscultation and percussion with normal breathing effort HEART: regular rate & rhythm and no murmurs and no lower extremity edema ABDOMEN:abdomen soft, (+) 2-3 cm hepatomegaly. Mild abdominal tenderness.  Musculoskeletal:no cyanosis of digits and no clubbing  NEURO: alert & oriented x 3 with fluent speech, no focal motor/sensory deficits  LABORATORY DATA:  I have reviewed the data as listed CBC Latest Ref Rng & Units 11/18/2017 11/05/2017 10/06/2017  WBC 3.9 - 10.3 K/uL 9.5 11.1(H) 8.4  Hemoglobin 11.6 - 15.9 g/dL 12.3 11.7 11.2(L)  Hematocrit 34.8 - 46.6 % 38.9 38.0 36.1  Platelets 145 - 400 K/uL 226 248 221     CMP Latest Ref Rng & Units 11/18/2017 11/05/2017 10/06/2017  Glucose 70 - 99 mg/dL 122(H) 128(H) 130(H)  BUN 6 - 20 mg/dL '9 8 6  '$ Creatinine 0.44 - 1.00 mg/dL 0.69 0.70 0.70  Sodium 135 - 145 mmol/L 141 142 140  Potassium 3.5 - 5.1 mmol/L 3.9 3.8 3.5  Chloride 98 - 111 mmol/L 106 108 106  CO2 22 - 32 mmol/L '26 24 26  '$ Calcium 8.9 - 10.3 mg/dL 8.3(L) 8.0(L) 8.4(L)  Total Protein 6.5 - 8.1 g/dL 7.4 7.2 7.1  Total Bilirubin 0.3 - 1.2 mg/dL 0.5 0.5 0.5  Alkaline Phos 38 - 126 U/L 207(H) 185(H) 169(H)  AST 15 - 41 U/L 15 16 14(L)  ALT 0 - 44 U/L '9 12 12    '$ Tumor Markers CEA Results for KARRISA, DIDIO (MRN [id number]) as of 11/02/2017 12:38  Ref. Range 07/07/2017 10:47 08/10/2017 14:54 08/25/2017 09:01 09/22/2017 14:34 10/06/2017 12:44  CEA ([facility]) Latest Ref Range: 0.00 - 5.00 ng/mL 494.58 (H) 754.17 (H) 862.87 (H) 61.00 (H) 28.92 (H)    PATHOLOGY REPORT:  Diagnosis 06/11/2017 Liver, needle/core biopsy - METASTATIC ADENOCARCINOMA, CONSISTENT WITH COLONIC PRIMARY.   RADIOGRAPHIC STUDIES: I have personally reviewed the radiological images as listed and agreed with the  findings in the report.    CT CAP w contrast, 07/23/2017 IMPRESSION 1. Mild progression of hepatic metastasis. 2. Similar rectosigmoid primary with abdominopelvic nodal metastasis. 3.  No acute process or evidence of metastatic disease in the chest. 4. Aortic Atherosclerosis (ICD10-I70.0). Coronary artery atherosclerosis. This is age advanced. 5. Proximal colonic constipation, again suggesting a component of partial obstruction at the primary site. 6. Mild ascending aortic dilatation at 4.1 cm.   ASSESSMENT & PLAN:  47 y.o. [place] female, with past medical  history of diabetes, hypertension, presented with left lower quadrant abdominal pain, hematochezia, fatigue and weight loss  1. Sigmoid adenocarcinoma, with abdominopelvic adenopathy and hepatic metastasis, stage IV, MSI-stable  -We previously reviewed medical records including imaging and pathology with the patient and family in detail. She has what appears to be metastatic sigmoid adenocarcinoma to abdominopelvic lymph nodes and liver.  -I previously discussed her liver biopsy results with patient and her mother, which confirmed metastatic colon cancer. I have requested her biopsy to be sent out for genomic testing foundation one -Given her diffuse liver metastasis, her cancer unfortunately is incurable.  We discussed the goal of therapy is palliative to prolong her life and preserve her quality of life. -Patient declined intensive chemotherapy, such as FOLFOX or FOLFIRI, due to the concern of side effects.  After multiple discussions, back-and-forth, she finally agreed with Xeloda and vectibix as her first line therapy.  -She started Xeloda and Vectibix on 08/25/17, tolerating well with mild rash.  -I previously advised her to increase to full dose '2000mg'$  in the AM and '1500mg'$  in the PM, one week on and one week off. She agreed to try, started 09/08/2017. Her rash worsened with higher dose, but recovered well. I encouraged her to continue  '2000mg'$  AM and '1500mg'$  PM. -Labs reviewed, Hg at 12.3, CMP showed Ca 8.3 and Alkaline phosphate 207. Overall adequate to proceed with Vectibix and Xeloda regimen.  -She has been denied for medicaid and disability.  Her insurance expired, and she missed 1 cycle treatment then.  We are able to replace her vectibix, and secured financial assistance for Xeloda, she restarted -She is tolerating treatment well, due to the skin rash, she has reduced her Xeloda to 1500 mg twice daily, all week on and one-week off.  She is tolerating very well. -Lab reviewed, adequate for treatment, will proceed vectibix today, she will start Xarelto tonight. -f/u, labs and panitumumab in 2 and 4 weeks  -CT CAP with constant 3-4 weeks   2. 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   -I previously recommended to intravenous iron, She declined. She has started oral iron. Due to constipation she is only taking 1 a day.  -I discussed oral iron may not be enough and discussed the option of IV iron. She will think about it.   3. DM, HTN, HL -We previously discussed possible effects of chemotherapy and steroid use on her DM, she will need to check BG closely and possibly adjust insulin if hyperglycemia is not well controlled; we reviewed the importance of tight glucose control while on chemotherapy. - I recommend she check her random glucose levels at home twice a day and record number. She should see her endocrinologist, Dr. Buddy Duty, if her levels remain high.   4. Weight loss  -She was previously given referral to nutrition to follow while on chemotherapy. We reviewed importance of adequate nutrition in order to tolerate chemotherapy  -I previously advised the patient to start consuming nutritional supplements to aid with weight loss.  -she has gained some weight back   5.  Genetics -Due to her young age and personal history of colon cancer, she qualifies  for genetics counseling to rule out inheritable cancer syndrome such as Lynch syndrome.  She agrees, referral was made   6. Financial and Social issues, Anxiety, depression -She is a single parent with 24 year old son; her child's father is involved. Patient currently  lives with her mother and has social support. She has met our SW and She follows up with Social Worker Hollice Espy -I will refer the patient to a counselor to aid with her anxiety regarding chemotherapy.  -She hasn't tried mirtazapine in the past and Wellbutrin in the past. She is currently on Xanax  -She recently applied for medicaid and disability and was denied. We will continue Xeloda for now and may need to switch to 5-FU Pump.  -she unfortunately lost her insurance, she is currently re-applying  7. Goal of care discussion  -We again discussed the incurable nature of her cancer, and the overall poor prognosis, especially if she does not have good response to chemotherapy or progress on chemo -The patient understands the goal of care is palliative. -she is full code for now   8. Rash on face, chest, scalp and back, moderate -secondary to Panitumumab -She will continue using hydrocortisone cream and clindamycin gel twice daily -I previously recommend oral antibiotic Doxycycline 100 mg BID to help reduce her rash.  -improved after chemo break   9. Multiple enlarged LN in cervical and inguinal areas -These were not there during last visit. No evidence of throat or groin infections.  -I will monitor, if worse, will include neck in next restaging scan  -Overall stable.   PLAN: -Continue Xeloda and panitumumab today and every 2 weeks  -CT CAP with contrast 3-4 weeks -f/u in 2 weeks   All questions were answered. The patient knows to call the clinic with any problems, questions or concerns. No barriers to learning was detected.  I spent 20 minutes counseling the patient face to face. The total time spent in the  appointment was 25 minutes and more than 50% was on counseling and review of test results  I, Noor Dweik am acting as scribe for Dr. Truitt Merle.  I have reviewed the above documentation for accuracy and completeness, and I agree with the above.    Truitt Merle, MD 11/18/2017

## 2017-11-13 ENCOUNTER — Other Ambulatory Visit: Payer: Self-pay | Admitting: Nurse Practitioner

## 2017-11-13 DIAGNOSIS — F419 Anxiety disorder, unspecified: Secondary | ICD-10-CM

## 2017-11-13 MED ORDER — ALPRAZOLAM 1 MG PO TABS: 1.0000 mg | ORAL_TABLET | Freq: Every evening | ORAL | 0 refills | Status: DC | PRN

## 2017-11-18 ENCOUNTER — Inpatient Hospital Stay (HOSPITAL_BASED_OUTPATIENT_CLINIC_OR_DEPARTMENT_OTHER): Payer: Self-pay | Admitting: Hematology

## 2017-11-18 ENCOUNTER — Encounter: Payer: Self-pay | Admitting: Hematology

## 2017-11-18 ENCOUNTER — Inpatient Hospital Stay: Payer: Self-pay

## 2017-11-18 VITALS — BP 127/86 | HR 86 | Temp 98.4°F | Resp 18 | Ht 65.0 in | Wt 154.5 lb

## 2017-11-18 DIAGNOSIS — C19 Malignant neoplasm of rectosigmoid junction: Secondary | ICD-10-CM

## 2017-11-18 DIAGNOSIS — D500 Iron deficiency anemia secondary to blood loss (chronic): Secondary | ICD-10-CM

## 2017-11-18 DIAGNOSIS — E119 Type 2 diabetes mellitus without complications: Secondary | ICD-10-CM

## 2017-11-18 DIAGNOSIS — C187 Malignant neoplasm of sigmoid colon: Secondary | ICD-10-CM

## 2017-11-18 DIAGNOSIS — Z79899 Other long term (current) drug therapy: Secondary | ICD-10-CM

## 2017-11-18 DIAGNOSIS — R599 Enlarged lymph nodes, unspecified: Secondary | ICD-10-CM

## 2017-11-18 DIAGNOSIS — R634 Abnormal weight loss: Secondary | ICD-10-CM

## 2017-11-18 DIAGNOSIS — I10 Essential (primary) hypertension: Secondary | ICD-10-CM

## 2017-11-18 DIAGNOSIS — C787 Secondary malignant neoplasm of liver and intrahepatic bile duct: Secondary | ICD-10-CM

## 2017-11-18 LAB — CBC WITH DIFFERENTIAL (CANCER CENTER ONLY)
Basophils Absolute: 0.1 10*3/uL (ref 0.0–0.1)
Basophils Relative: 1 %
Eosinophils Absolute: 0.5 10*3/uL (ref 0.0–0.5)
Eosinophils Relative: 5 %
HCT: 38.9 % (ref 34.8–46.6)
Hemoglobin: 12.3 g/dL (ref 11.6–15.9)
Lymphocytes Relative: 22 %
Lymphs Abs: 2.1 10*3/uL (ref 0.9–3.3)
MCH: 26.2 pg (ref 25.1–34.0)
MCHC: 31.7 g/dL (ref 31.5–36.0)
MCV: 82.6 fL (ref 79.5–101.0)
Monocytes Absolute: 0.6 10*3/uL (ref 0.1–0.9)
Monocytes Relative: 6 %
Neutro Abs: 6.3 10*3/uL (ref 1.5–6.5)
Neutrophils Relative %: 66 %
Platelet Count: 226 10*3/uL (ref 145–400)
RBC: 4.71 MIL/uL (ref 3.70–5.45)
RDW: 28.8 % — ABNORMAL HIGH (ref 11.2–14.5)
WBC Count: 9.5 10*3/uL (ref 3.9–10.3)

## 2017-11-18 LAB — CMP (CANCER CENTER ONLY)
ALT: 9 U/L (ref 0–44)
AST: 15 U/L (ref 15–41)
Albumin: 3.4 g/dL — ABNORMAL LOW (ref 3.5–5.0)
Alkaline Phosphatase: 207 U/L — ABNORMAL HIGH (ref 38–126)
Anion gap: 9 (ref 5–15)
BUN: 9 mg/dL (ref 6–20)
CO2: 26 mmol/L (ref 22–32)
Calcium: 8.3 mg/dL — ABNORMAL LOW (ref 8.9–10.3)
Chloride: 106 mmol/L (ref 98–111)
Creatinine: 0.69 mg/dL (ref 0.44–1.00)
GFR, Est AFR Am: >60 mL/min (ref >60)
GFR, Estimated: >60 mL/min (ref >60)
Glucose, Bld: 122 mg/dL — ABNORMAL HIGH (ref 70–99)
Potassium: 3.9 mmol/L (ref 3.5–5.1)
Sodium: 141 mmol/L (ref 135–145)
Total Bilirubin: 0.5 mg/dL (ref 0.3–1.2)
Total Protein: 7.4 g/dL (ref 6.5–8.1)

## 2017-11-18 LAB — MAGNESIUM: Magnesium: 1.9 mg/dL (ref 1.7–2.4)

## 2017-11-18 MED ORDER — SODIUM CHLORIDE 0.9 % IV SOLN
Freq: Once | INTRAVENOUS | Status: AC
  Administered 2017-11-18: 13:00:00 via INTRAVENOUS
  Filled 2017-11-18: qty 250

## 2017-11-18 MED ORDER — SODIUM CHLORIDE 0.9 % IV SOLN
6.0000 mg/kg | Freq: Once | INTRAVENOUS | Status: AC
  Administered 2017-11-18: 400 mg via INTRAVENOUS
  Filled 2017-11-18: qty 20

## 2017-11-18 NOTE — Patient Instructions (Signed)
[facility] Discharge Instructions for Patients Receiving Chemotherapy  Today you received the following chemotherapy agents: Vectibix.  To help prevent nausea and vomiting after your treatment, we encourage you to take your nausea medication as directed.   If you develop nausea and vomiting that is not controlled by your nausea medication, call the clinic.   BELOW ARE SYMPTOMS THAT SHOULD BE REPORTED IMMEDIATELY:  *FEVER GREATER THAN 100.5 F  *CHILLS WITH OR WITHOUT FEVER  NAUSEA AND VOMITING THAT IS NOT CONTROLLED WITH YOUR NAUSEA MEDICATION  *UNUSUAL SHORTNESS OF BREATH  *UNUSUAL BRUISING OR BLEEDING  TENDERNESS IN MOUTH AND THROAT WITH OR WITHOUT PRESENCE OF ULCERS  *URINARY PROBLEMS  *BOWEL PROBLEMS  UNUSUAL RASH Items with * indicate a potential emergency and should be followed up as soon as possible.  Feel free to call the clinic should you have any questions or concerns. The clinic phone number is [phone].  Please show the CHEMO ALERT CARD at check-in to the Emergency Department and triage nurse.   

## 2017-11-19 ENCOUNTER — Telehealth: Payer: Self-pay | Admitting: Hematology

## 2017-11-19 NOTE — Telephone Encounter (Signed)
CT CAP with contrast in 3-4 weeks / pt did not stop by scheduling to pick up contrast material per 8/28 los

## 2017-11-26 ENCOUNTER — Telehealth: Payer: Self-pay

## 2017-11-26 NOTE — Telephone Encounter (Signed)
Patient calls with c/o nasal congestion, mild sore throat x 1 day.  Denies fever or chills.  Wanting to know if she can take OTC medications for cold.  Informed her she can however needs to call back if symptoms worsen and/or develops a fever.    Patient verbalized an understanding.

## 2017-12-01 ENCOUNTER — Telehealth: Payer: Self-pay

## 2017-12-01 NOTE — Progress Notes (Deleted)
Subjective:    Patient ID: Beverly Schwartz, female    DOB: 27-Oct-1970, 47 y.o.   MRN: [id number]  HPI:  Beverly Schwartz is here for regular f/u: THN, T2D, anxiety, and metastatic sigmoid colon cancer - followed closely by Oncology. She is currently on Novolin 70/30 25 U BID She reports AM BS she denies episodes of hypoglycemia Lab Results  Component Value Date   HGBA1C 9.0 (H) 05/31/2017   HGBA1C 8.4 09/30/2016   HGBA1C 7.9 07/07/2016    Patient Care Team    Relationship Specialty Notifications Start End  Mina Marble D, NP PCP - General Family Medicine  07/07/16   Delrae Rend, MD Consulting Physician Endocrinology  07/07/16   Truitt Merle, [place] Physician Hematology  06/05/17   Alla Feeling, NP Nurse Practitioner Nurse Practitioner  06/05/17   Clarene Essex, MD Consulting Physician Gastroenterology  06/05/17     Patient Active Problem List   Diagnosis Date Noted  . Genetic testing 08/10/2017  . Family history of breast cancer   . Family history of stomach cancer   . Goals of care, counseling/discussion 06/15/2017  . Malignant neoplasm of rectosigmoid junction ([place]) 06/03/2017  . SIRS (systemic inflammatory response syndrome) ([place]) 05/30/2017  . Healthcare maintenance 12/10/2016  . Elevated LFTs 12/10/2016  . Generalized abdominal pain 09/30/2016  . Anxiety 09/30/2016  . Sinusitis 09/03/2016  . Diabetes mellitus without complication ([place]) 78/29/5621  . Hypothyroid 07/07/2016  . Hypertension 07/07/2016  . Hyperlipidemia 08/06/2010  . Tachycardia   . Chest pain   . Dizziness   . Obesity      Past Medical History:  Diagnosis Date  . Anxiety   . Cancer ([place])    colon, liver  . Chest pain   . Colon cancer ([place])   . Complication of anesthesia   . Depression   . Diabetes mellitus   . Dizziness   . Family history of breast cancer   . Family history of stomach cancer   . Hyperlipidemia   . Hypertension   . Hypothyroidism   . Obesity   . PONV (postoperative  nausea and vomiting)   . Tachycardia      Past Surgical History:  Procedure Laterality Date  . FLEXIBLE SIGMOIDOSCOPY N/A 05/31/2017   Procedure: FLEXIBLE SIGMOIDOSCOPY;  Surgeon: Clarene Essex, MD;  Location: [facility];  Service: Endoscopy;  Laterality: N/A;  . WISDOM TOOTH EXTRACTION     age 16's     Family History  Problem Relation Age of Onset  . Heart disease Father   . Healthy Mother   . Hyperlipidemia Sister   . Healthy Brother   . Heart disease Paternal Uncle   . Heart attack Paternal Uncle   . Healthy Son   . Breast cancer Maternal Grandmother        d. in mid 47s  . Stomach cancer Paternal Grandfather        d. 50  . Colon cancer Neg Hx   . Esophageal cancer Neg Hx   . Rectal cancer Neg Hx   . Liver cancer Neg Hx      Social History   Substance and Sexual Activity  Drug Use No     Social History   Substance and Sexual Activity  Alcohol Use No     Social History   Tobacco Use  Smoking Status Never Smoker  Smokeless Tobacco Never Used     Outpatient Encounter Medications as of 12/08/2017  Medication Sig  . acetaminophen-codeine (TYLENOL #  3) 300-30 MG tablet Take 1 tablet by mouth every 6 (six) hours as needed for moderate pain.  Marland Kitchen ALPRAZolam (XANAX) 1 MG tablet Take 1 tablet (1 mg total) by mouth at bedtime as needed for anxiety.  Marland Kitchen atorvastatin (LIPITOR) 10 MG tablet Take 1 tablet (10 mg total) by mouth daily.  . capecitabine (XELODA) 500 MG tablet Take 4 tablets (2000mg ) in AM & and 3 tabs (1500mg ) in PM, within 91min of finishing food. Take on days 1-7 & 15-21 of each 28 day cycle  . CINNAMON PO Take 1 tablet by mouth daily.   . clindamycin (CLINDAGEL) 1 % gel Apply topically 2 (two) times daily.  Marland Kitchen doxycycline (VIBRA-TABS) 100 MG tablet Take 1 tablet (100 mg total) by mouth 2 (two) times daily.  . fluticasone (FLONASE) 50 MCG/ACT nasal spray Place 1 spray into both nostrils daily. (Patient taking differently: Place 1 spray into both nostrils  daily as needed for allergies. )  . insulin NPH-regular Human (NOVOLIN 70/30) (70-30) 100 UNIT/ML injection Inject 25 Units into the skin 2 (two) times daily with a meal.   . levothyroxine (SYNTHROID, LEVOTHROID) 112 MCG tablet Take 112 mcg by mouth daily before breakfast.  . methylPREDNISolone (MEDROL DOSEPAK) 4 MG TBPK tablet Take as directed  . Omega-3 Fatty Acids (FISH OIL) 1000 MG CAPS Take 1,000 mg by mouth daily.   . polyethylene glycol (MIRALAX / GLYCOLAX) packet Take 17 g by mouth daily.  . propranolol (INDERAL) 10 MG tablet TAKE 1 TABLET BY MOUTH EVERY DAY  . TURMERIC PO Take 1 capsule by mouth daily.   . [DISCONTINUED] prochlorperazine (COMPAZINE) 10 MG tablet Take 1 tablet (10 mg total) by mouth every 6 (six) hours as needed (NAUSEA).   No facility-administered encounter medications on file as of 12/08/2017.     Allergies: Bupropion and Amoxicillin  There is no height or weight on file to calculate BMI.  There were no vitals taken for this visit.     Review of Systems     Objective:   Physical Exam        Assessment & Plan:  No diagnosis found.  No problem-specific Assessment & Plan notes found for this encounter.    FOLLOW-UP:  No follow-ups on file.

## 2017-12-01 NOTE — Telephone Encounter (Signed)
Patient calls stating that she is still sick with upper respiratory symptoms, afrebrile, coughing, per Cira Rue NP instructed patient to keep her appointment for tomorrow. She will evaluate her and decide if she can have treatment.

## 2017-12-02 ENCOUNTER — Inpatient Hospital Stay (HOSPITAL_BASED_OUTPATIENT_CLINIC_OR_DEPARTMENT_OTHER): Payer: Self-pay | Admitting: Nurse Practitioner

## 2017-12-02 ENCOUNTER — Telehealth: Payer: Self-pay

## 2017-12-02 ENCOUNTER — Inpatient Hospital Stay: Payer: Self-pay | Attending: Hematology

## 2017-12-02 ENCOUNTER — Inpatient Hospital Stay: Payer: Self-pay

## 2017-12-02 ENCOUNTER — Encounter: Payer: Self-pay | Admitting: Nurse Practitioner

## 2017-12-02 VITALS — BP 140/90 | HR 95 | Temp 99.0°F | Resp 20 | Ht 65.0 in | Wt 152.3 lb

## 2017-12-02 DIAGNOSIS — L270 Generalized skin eruption due to drugs and medicaments taken internally: Secondary | ICD-10-CM | POA: Insufficient documentation

## 2017-12-02 DIAGNOSIS — Z79899 Other long term (current) drug therapy: Secondary | ICD-10-CM

## 2017-12-02 DIAGNOSIS — F329 Major depressive disorder, single episode, unspecified: Secondary | ICD-10-CM | POA: Insufficient documentation

## 2017-12-02 DIAGNOSIS — F419 Anxiety disorder, unspecified: Secondary | ICD-10-CM | POA: Insufficient documentation

## 2017-12-02 DIAGNOSIS — C19 Malignant neoplasm of rectosigmoid junction: Secondary | ICD-10-CM

## 2017-12-02 DIAGNOSIS — E039 Hypothyroidism, unspecified: Secondary | ICD-10-CM

## 2017-12-02 DIAGNOSIS — D509 Iron deficiency anemia, unspecified: Secondary | ICD-10-CM | POA: Insufficient documentation

## 2017-12-02 DIAGNOSIS — I10 Essential (primary) hypertension: Secondary | ICD-10-CM | POA: Insufficient documentation

## 2017-12-02 DIAGNOSIS — C787 Secondary malignant neoplasm of liver and intrahepatic bile duct: Secondary | ICD-10-CM

## 2017-12-02 DIAGNOSIS — E6609 Other obesity due to excess calories: Secondary | ICD-10-CM | POA: Insufficient documentation

## 2017-12-02 DIAGNOSIS — J22 Unspecified acute lower respiratory infection: Secondary | ICD-10-CM

## 2017-12-02 DIAGNOSIS — Z5112 Encounter for antineoplastic immunotherapy: Secondary | ICD-10-CM | POA: Insufficient documentation

## 2017-12-02 DIAGNOSIS — C187 Malignant neoplasm of sigmoid colon: Secondary | ICD-10-CM

## 2017-12-02 DIAGNOSIS — E119 Type 2 diabetes mellitus without complications: Secondary | ICD-10-CM

## 2017-12-02 DIAGNOSIS — J069 Acute upper respiratory infection, unspecified: Secondary | ICD-10-CM | POA: Insufficient documentation

## 2017-12-02 DIAGNOSIS — D500 Iron deficiency anemia secondary to blood loss (chronic): Secondary | ICD-10-CM

## 2017-12-02 LAB — CMP (CANCER CENTER ONLY)
ALT: 9 U/L (ref 0–44)
AST: 17 U/L (ref 15–41)
Albumin: 3.3 g/dL — ABNORMAL LOW (ref 3.5–5.0)
Alkaline Phosphatase: 182 U/L — ABNORMAL HIGH (ref 38–126)
Anion gap: 11 (ref 5–15)
BUN: 8 mg/dL (ref 6–20)
CO2: 24 mmol/L (ref 22–32)
Calcium: 8.7 mg/dL — ABNORMAL LOW (ref 8.9–10.3)
Chloride: 106 mmol/L (ref 98–111)
Creatinine: 0.73 mg/dL (ref 0.44–1.00)
GFR, Est AFR Am: >60 mL/min (ref >60)
GFR, Estimated: >60 mL/min (ref >60)
Glucose, Bld: 178 mg/dL — ABNORMAL HIGH (ref 70–99)
Potassium: 3.6 mmol/L (ref 3.5–5.1)
Sodium: 141 mmol/L (ref 135–145)
Total Bilirubin: 0.5 mg/dL (ref 0.3–1.2)
Total Protein: 7.3 g/dL (ref 6.5–8.1)

## 2017-12-02 LAB — CBC WITH DIFFERENTIAL (CANCER CENTER ONLY)
Basophils Absolute: 0.1 10*3/uL (ref 0.0–0.1)
Basophils Relative: 1 %
Eosinophils Absolute: 0.2 10*3/uL (ref 0.0–0.5)
Eosinophils Relative: 2 %
HCT: 39.9 % (ref 34.8–46.6)
Hemoglobin: 12.9 g/dL (ref 11.6–15.9)
Lymphocytes Relative: 22 %
Lymphs Abs: 2.1 10*3/uL (ref 0.9–3.3)
MCH: 26.9 pg (ref 25.1–34.0)
MCHC: 32.3 g/dL (ref 31.5–36.0)
MCV: 83.1 fL (ref 79.5–101.0)
Monocytes Absolute: 0.6 10*3/uL (ref 0.1–0.9)
Monocytes Relative: 6 %
Neutro Abs: 6.9 10*3/uL — ABNORMAL HIGH (ref 1.5–6.5)
Neutrophils Relative %: 69 %
Platelet Count: 229 10*3/uL (ref 145–400)
RBC: 4.8 MIL/uL (ref 3.70–5.45)
RDW: 27.3 % — ABNORMAL HIGH (ref 11.2–14.5)
WBC Count: 9.9 10*3/uL (ref 3.9–10.3)

## 2017-12-02 LAB — FERRITIN: Ferritin: 12 ng/mL (ref 11–307)

## 2017-12-02 LAB — IRON AND TIBC
Iron: 50 ug/dL (ref 41–142)
Saturation Ratios: 12 % — ABNORMAL LOW (ref 21–57)
TIBC: 430 ug/dL (ref 236–444)
UIBC: 380 ug/dL

## 2017-12-02 LAB — MAGNESIUM: Magnesium: 1.9 mg/dL (ref 1.7–2.4)

## 2017-12-02 LAB — CEA (IN HOUSE-CHCC): CEA (CHCC-In House): 11.33 ng/mL — ABNORMAL HIGH (ref 0.00–5.00)

## 2017-12-02 MED ORDER — AZITHROMYCIN 250 MG PO TABS: ORAL_TABLET | ORAL | 0 refills | Status: DC

## 2017-12-02 MED ORDER — BENZONATATE 100 MG PO CAPS: 100.0000 mg | ORAL_CAPSULE | Freq: Three times a day (TID) | ORAL | 0 refills | Status: DC | PRN

## 2017-12-02 MED ORDER — ACETAMINOPHEN-CODEINE #3 300-30 MG PO TABS: 1.0000 | ORAL_TABLET | Freq: Four times a day (QID) | ORAL | 0 refills | Status: DC | PRN

## 2017-12-02 MED FILL — BENZONATATE 100 MG CAP: 100 | 6 days supply | Qty: 20 | Fill #0

## 2017-12-02 MED FILL — AZITHROMYCIN 250 MG TABLET: 250 | 5 days supply | Qty: 6 | Fill #0

## 2017-12-02 MED FILL — ACETAMINOPHEN/COD #3 TABLET: 300-30 | 7 days supply | Qty: 30 | Fill #0

## 2017-12-02 NOTE — Telephone Encounter (Signed)
Printed avs and calender of upcoming appointment.per 9/11 los

## 2017-12-02 NOTE — Progress Notes (Signed)
[place]  [phone] [phone]  Clinic Follow up Note   Patient Care Team: Esaw Grandchild, NP as PCP - General (Family Medicine) Delrae Rend, MD as Consulting Physician (Endocrinology) Truitt Merle, MD as Consulting Physician (Hematology) Alla Feeling, NP as Nurse Practitioner (Nurse Practitioner) Clarene Essex, MD as Consulting Physician (Gastroenterology) 12/02/2017  SUMMARY OF ONCOLOGIC HISTORY: Oncology History   Cancer Staging Malignant neoplasm of rectosigmoid junction [facility]) Staging form: Colon and Rectum, AJCC 8th Edition - Clinical stage from 06/11/2017: Stage IVA (cTX, cN1b, pM1a) - Signed by Truitt Merle, MD on 06/15/2017       Malignant neoplasm of rectosigmoid junction ([place])   05/30/2017 Imaging    CT AP IMPRESSION: 1. 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 Abdominopelvic adenopathy. Rectosigmoid mass with suggestion of partial obstruction as evidenced by large colonic stool burden more proximally. 2.  Aortic Atherosclerosis (ICD10-I70.0).  This is age advanced.    05/31/2017 Tumor Marker    CEA 353.3 (elevated) AFP 4.5 (normal)    05/31/2017 Initial Biopsy    Diagnosis Colon, biopsy, sigmoid - INVASIVE ADENOCARCINOMA - SEE COMMENT    05/31/2017 Imaging    CT CHEST IMPRESSION: 1. No evidence of metastatic disease in the chest. 2. No acute findings.  Aortic Atherosclerosis (ICD10-I70.0).     05/31/2017 Procedure    Colonoscopy per Dr. Watt Climes Findings: An infiltrative and ulcerated partially obstructing medium-sized mass was found in the recto-sigmoid colon. The mass was circumferential. The mass measured four cm in length. No bleeding was present.   Impression - One small polyp in the rectum. - The examination was otherwise normal. - Malignant partially obstructing tumor in the recto-sigmoid colon. Biopsied. - One medium polyp in the proximal sigmoid  colon. - The examination was otherwise normal. - Internal hemorrhoids.    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 Mild progression of hepatic metastasis. 2. Similar rectosigmoid primary with abdominopelvic nodal metastasis. 3.  No acute process or evidence of metastatic disease in the chest. 4. Aortic Atherosclerosis (ICD10-I70.0). Coronary artery atherosclerosis. This is age advanced. 5. Proximal colonic constipation, again suggesting a component of partial obstruction at the primary site. 6. Mild ascending aortic dilatation at 4.1 cm.    08/25/2017 -  Chemotherapy    -Xeloda '1500mg'$  BID 1 week on and 1 week off starting 08/25/17 -Vectibix every 2 weeks starting 08/25/17   CURRENT THERAPY:   -Xeloda '1500mg'$  BID 1 week on and 1 week off starting 08/25/17. Increased to '2000mg'$  in the AM and '1500mg'$  in the PM starting 09/08/17, dose was reduced to 1500 mg every 12 hours, 1 week on and one week of subsequently due to skin rash -Vectibix every 2 weeks starting 08/25/17  INTERVAL HISTORY: Ms. Lauman returns for follow up and cycle 6 xeloda/vectibix as scheduled. She was last seen on 8/28 for cycle 5. She continues Xeloda 1500 mg BID 1 week on and 1 week off. She had 1 month treatment breast between cycles 3 and 4 due to losing her insurance. She was approved for [employer], and is still  in the process of applying for medicaid. She reports catching her son's cold that began 5/6 days ago with sore throat and nasal congestion. She developed productive cough 4 days ago, sputum is mostly clear. Denies cp or dyspnea. She has had low grade fever 99 max with an episode of  hot flash. She has increased fatigue and weakness. She has been resting most of the day since onset. Has tried nyquil, dayquil, and otc cough suppressant without much relief. She had mild nausea without vomiting after taking cold medicine. No v/c/d or blood in stool. Her appetite has been low while sick; denies mucositis. She had an episode of abdominal pain last night. Denies neuropathy.    MEDICAL HISTORY:  Past Medical History:  Diagnosis Date  . Anxiety   . Cancer ([place])    colon, liver  . Chest pain   . Colon cancer ([place])   . Complication of anesthesia   . Depression   . Diabetes mellitus   . Dizziness   . Family history of breast cancer   . Family history of stomach cancer   . Hyperlipidemia   . Hypertension   . Hypothyroidism   . Obesity   . PONV (postoperative nausea and vomiting)   . Tachycardia     SURGICAL HISTORY: Past Surgical History:  Procedure Laterality Date  . FLEXIBLE SIGMOIDOSCOPY N/A 05/31/2017   Procedure: FLEXIBLE SIGMOIDOSCOPY;  Surgeon: Clarene Essex, MD;  Location: [facility];  Service: Endoscopy;  Laterality: N/A;  . WISDOM TOOTH EXTRACTION     age 67's    I have reviewed the social history and family history with the patient and they are unchanged from previous note.  ALLERGIES:  is allergic to bupropion and amoxicillin.  MEDICATIONS:  Current Outpatient Medications  Medication Sig Dispense Refill  . acetaminophen-codeine (TYLENOL #3) 300-30 MG tablet Take 1 tablet by mouth every 6 (six) hours as needed for moderate pain. 30 tablet 0  . ALPRAZolam (XANAX) 1 MG tablet Take 1 tablet (1 mg total) by mouth at bedtime as needed for anxiety. 30 tablet 0  . atorvastatin (LIPITOR) 10 MG tablet Take 1 tablet (10 mg total) by mouth daily. 90 tablet 3  . capecitabine (XELODA) 500 MG tablet Take 4 tablets ('2000mg'$ ) in AM & and 3 tabs ('1500mg'$ ) in PM, within 61mn of finishing food. Take on days 1-7 & 15-21 of each 28 day cycle 294 tablet 0  . CINNAMON PO Take  1 tablet by mouth daily.     . clindamycin (CLINDAGEL) 1 % gel Apply topically 2 (two) times daily. 60 g 1  . fluticasone (FLONASE) 50 MCG/ACT nasal spray Place 1 spray into both nostrils daily. (Patient taking differently: Place 1 spray into both nostrils daily as needed for allergies. ) 16 g 2  . insulin NPH-regular Human (NOVOLIN 70/30) (70-30) 100 UNIT/ML injection Inject 25 Units into the skin 2 (two) times daily with a meal.     . levothyroxine (SYNTHROID, LEVOTHROID) 112 MCG tablet Take 112 mcg by mouth daily before breakfast.    . Omega-3 Fatty Acids (FISH OIL) 1000 MG CAPS Take 1,000 mg by mouth daily.     . polyethylene glycol (MIRALAX / GLYCOLAX) packet Take 17 g by mouth daily. 14 each 0  . propranolol (INDERAL) 10 MG tablet TAKE 1 TABLET BY MOUTH EVERY DAY 90 tablet 0  . TURMERIC PO Take 1 capsule by mouth daily.     .Marland Kitchenazithromycin (ZITHROMAX Z-PAK) 250 MG tablet Take as prescribed 6  each 0  . benzonatate (TESSALON) 100 MG capsule Take 1 capsule (100 mg total) by mouth 3 (three) times daily as needed for cough. 20 capsule 0  . doxycycline (VIBRA-TABS) 100 MG tablet Take 1 tablet (100 mg total) by mouth 2 (two) times daily. 60 tablet 2  . methylPREDNISolone (MEDROL DOSEPAK) 4 MG TBPK tablet Take as directed 21 tablet 0   No current facility-administered medications for this visit.     PHYSICAL EXAMINATION: ECOG PERFORMANCE STATUS: 2 - Symptomatic, <50% confined to bed  Vitals:   12/02/17 1433 12/02/17 1500  BP: (!) 159/104 140/90  Pulse: 95   Resp: 20   Temp: 99 F (37.2 C)   SpO2: 98%    Filed Weights   12/02/17 1433  Weight: 152 lb 4.8 oz (69.1 kg)    GENERAL:alert, no distress and comfortable SKIN: skin color, texture, turgor are normal, scattered acne type rash to face, chest, and back secondary to vectibix  EYES: sclera clear OROPHARYNX:no thrush or ulcers  LYMPH:  Enlarged cervical nodes L>R  LUNGS: clear to auscultation with normal breathing effort HEART:  regular rate & rhythm, no lower extremity edema ABDOMEN: abdomen soft, non-tender and normal bowel sounds. Mild hepatomegaly  Musculoskeletal:no cyanosis of digits and no clubbing  NEURO: alert & oriented x 3 with fluent speech, no focal motor/sensory deficits  LABORATORY DATA:  I have reviewed the data as listed CBC Latest Ref Rng & Units 12/02/2017 11/18/2017 11/05/2017  WBC 3.9 - 10.3 K/uL 9.9 9.5 11.1(H)  Hemoglobin 11.6 - 15.9 g/dL 12.9 12.3 11.7  Hematocrit 34.8 - 46.6 % 39.9 38.9 38.0  Platelets 145 - 400 K/uL 229 226 248     CMP Latest Ref Rng & Units 12/02/2017 11/18/2017 11/05/2017  Glucose 70 - 99 mg/dL 178(H) 122(H) 128(H)  BUN 6 - 20 mg/dL '8 9 8  '$ Creatinine 0.44 - 1.00 mg/dL 0.73 0.69 0.70  Sodium 135 - 145 mmol/L 141 141 142  Potassium 3.5 - 5.1 mmol/L 3.6 3.9 3.8  Chloride 98 - 111 mmol/L 106 106 108  CO2 22 - 32 mmol/L '24 26 24  '$ Calcium 8.9 - 10.3 mg/dL 8.7(L) 8.3(L) 8.0(L)  Total Protein 6.5 - 8.1 g/dL 7.3 7.4 7.2  Total Bilirubin 0.3 - 1.2 mg/dL 0.5 0.5 0.5  Alkaline Phos 38 - 126 U/L 182(H) 207(H) 185(H)  AST 15 - 41 U/L '17 15 16  '$ ALT 0 - 44 U/L '9 9 12   '$ CEA: 05/31/2017:353.3 07/07/2017: 494.58 08/10/2017: 754.17 08/25/2017: 862.87 (began treatment) 09/22/2017: 61.0 10/06/2017: 28.92 11/05/2017: 66.63 12/02/2017: 11.33  PATHOLOGY REPORT:  Diagnosis 06/11/2017 Liver, needle/core biopsy - METASTATIC ADENOCARCINOMA, CONSISTENT WITH COLONIC PRIMARY.    RADIOGRAPHIC STUDIES: I have personally reviewed the radiological images as listed and agreed with the findings in the report. No results found.   ASSESSMENT & PLAN: 47 y.o.Acac[place] female, with past medical history of diabetes, hypertension, presented with left lower quadrant abdominal pain, hematochezia, fatigue and weight loss  1. Sigmoid adenocarcinoma, with abdominopelvic adenopathy andhepatic metastasis, stage IV, MSI-stable 2. Anemia, iron deficiency  3. DM, HTN, HL 4. Weight loss 5. Genetics  6.  Financial and social support - approved for [employer], in process of applying for medicaid as of 9/11 visit  7. Anxiety, depression 8. Goals of care discussion  9. Rash face, chest, scalp, back - not taking doxycycline  10. Unwitnessed ? syncopal episode 09/06/17 with warmth and dizziness  11. Periorbital edema and facial erythema, possibly related to vectibix  12.  Multiple enlarged LN in cervical and previously in inguinal area  13. Acute respiratory infection   Ms. Shindler appears stable. She completed cycle 5 Xeloda and panitumumab. She had 1 month treatment break between cycles 3 and 4 for insurance reasons. Her CEA increased slightly while off chemo, but has since decreased to 11.33 today. She is tolerating treatment moderately well overall, with mild fatigue and skin rash. She is scheduled for restaging CT after cycle 6 in 2 weeks.   She developed acute respiratory illness after her son had a cold. She has fatigue, congestion, and productive cough for approximately 1 week and has not responded to OTC remedies. CBC is unremarkable. I reviewed her symptoms are likely viral, but will treat with antibiotics to cover bacterial etiologies and prevent secondary infection. She prefers to wait a few days to see if she recovers naturally. I urged her to monitor for fever, chills, increased cough and sputum, or other concerning symptoms. If she does not notice improvement in her symptoms in 1-2 days I recommend she take antibiotic. Will postpone chemo 1 week. In the meantime I urged her to push fluids and get rest.   Her BP is elevated in clinic today, likely related to cold meds. Improved on recheck before discharge home.   PLAN: -Labs reviewed -Postpone treatment 1 week for acute respiratory illness -Rx: Zpak and tessalon pearls  -Return in 1 week for f/u and Xeloda/panitumumab   All questions were answered. The patient knows to call the clinic with any problems, questions or concerns. No barriers  to learning was detected. I spent 20 minutes counseling the patient face to face. The total time spent in the appointment was 25 minutes and more than 50% was on counseling and review of test results     Alla Feeling, NP 12/02/17

## 2017-12-06 NOTE — Progress Notes (Signed)
[facility]  [phone] Fax:(336) [phone]  Clinic Follow up Note   Patient Care Team: Esaw Grandchild, NP as PCP - General (Family Medicine) Delrae Rend, MD as Consulting Physician (Endocrinology) Truitt Merle, MD as Consulting Physician (Hematology) Alla Feeling, NP as Nurse Practitioner (Nurse Practitioner) Clarene Essex, MD as Consulting Physician (Gastroenterology) 12/08/2017  SUMMARY OF ONCOLOGIC HISTORY: Oncology History   Cancer Staging Malignant neoplasm of rectosigmoid junction [facility]) Staging form: Colon and Rectum, AJCC 8th Edition - Clinical stage from 06/11/2017: Stage IVA (cTX, cN1b, pM1a) - Signed by Truitt Merle, MD on 06/15/2017       Malignant neoplasm of rectosigmoid junction ([place])   05/30/2017 Imaging    CT AP IMPRESSION: 1. 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 Abdominopelvic adenopathy. Rectosigmoid mass with suggestion of partial obstruction as evidenced by large colonic stool burden more proximally. 2.  Aortic Atherosclerosis (ICD10-I70.0).  This is age advanced.    05/31/2017 Tumor Marker    CEA 353.3 (elevated) AFP 4.5 (normal)    05/31/2017 Initial Biopsy    Diagnosis Colon, biopsy, sigmoid - INVASIVE ADENOCARCINOMA - SEE COMMENT    05/31/2017 Imaging    CT CHEST IMPRESSION: 1. No evidence of metastatic disease in the chest. 2. No acute findings.  Aortic Atherosclerosis (ICD10-I70.0).     05/31/2017 Procedure    Colonoscopy per Dr. Watt Climes Findings: An infiltrative and ulcerated partially obstructing medium-sized mass was found in the recto-sigmoid colon. The mass was circumferential. The mass measured four cm in length. No bleeding was present.   Impression - One small polyp in the rectum. - The examination was otherwise normal. - Malignant partially obstructing tumor in the recto-sigmoid colon. Biopsied. - One medium polyp in the proximal sigmoid  colon. - The examination was otherwise normal. - Internal hemorrhoids.    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 Mild progression of hepatic metastasis. 2. Similar rectosigmoid primary with abdominopelvic nodal metastasis. 3.  No acute process or evidence of metastatic disease in the chest. 4. Aortic Atherosclerosis (ICD10-I70.0). Coronary artery atherosclerosis. This is age advanced. 5. Proximal colonic constipation, again suggesting a component of partial obstruction at the primary site. 6. Mild ascending aortic dilatation at 4.1 cm.    08/25/2017 -  Chemotherapy    -Xeloda 1563m BID 1 week on and 1 week off starting 08/25/17 -Vectibix every 2 weeks starting 08/25/17   CURRENT THERAPY:  -Xeloda 15043mBID 1 week on and 1 week off starting 08/25/17. Increased to 20007mn the AM and 1500m10m the PM starting 09/08/17,dose was reduced to 1500 mg every 12 hours, 1 week on and one week of subsequently due to skin rash -Vectibix every 2 weeks starting 08/25/17  INTERVAL HISTORY: Ms. KnigCozzaurns for follow up as scheduled. She seen last week and treatment was held for URI. She was given Zpack but did not take it. Her cough is much improved and URI is nearly resolved. Has not taken cold meds in several days, no recent phlegm, sore throat, fever, chills, chest pain, or dyspnea. She continues to report fatigue  and low activity. Able to eat and drinks. Scalp itches and feet are dry, secondary to treatment. No skin breakdown, neuropathy, or mucositis. Intermittent abdominal pain is stable, exacerbated with certain foods such as raw vegetables. She started  taking 1 iron tab more consistently as of last week.    MEDICAL HISTORY:  Past Medical History:  Diagnosis Date  . Anxiety   . Cancer ([place])    colon, liver  . Chest pain   . Colon cancer ([place])   . Complication of anesthesia   . Depression   . Diabetes mellitus   . Dizziness   . Family history of breast cancer   . Family history of stomach cancer   . Hyperlipidemia   . Hypertension   . Hypothyroidism   . Obesity   . PONV (postoperative nausea and vomiting)   . Tachycardia     SURGICAL HISTORY: Past Surgical History:  Procedure Laterality Date  . FLEXIBLE SIGMOIDOSCOPY N/A 05/31/2017   Procedure: FLEXIBLE SIGMOIDOSCOPY;  Surgeon: Clarene Essex, MD;  Location: [facility];  Service: Endoscopy;  Laterality: N/A;  . WISDOM TOOTH EXTRACTION     age 47's    I have reviewed the social history and family history with the patient and they are unchanged from previous note.  ALLERGIES:  is allergic to bupropion and amoxicillin.  MEDICATIONS:  Current Outpatient Medications  Medication Sig Dispense Refill  . acetaminophen-codeine (TYLENOL #3) 300-30 MG tablet Take 1 tablet by mouth every 6 (six) hours as needed for moderate pain. 30 tablet 0  . ALPRAZolam (XANAX) 1 MG tablet Take 1 tablet (1 mg total) by mouth at bedtime as needed for anxiety. 30 tablet 0  . atorvastatin (LIPITOR) 10 MG tablet Take 1 tablet (10 mg total) by mouth daily. 90 tablet 3  . azithromycin (ZITHROMAX Z-PAK) 250 MG tablet Take as prescribed 6 each 0  . benzonatate (TESSALON) 100 MG capsule Take 1 capsule (100 mg total) by mouth 3 (three) times daily as needed for cough. 20 capsule 0  . capecitabine (XELODA) 500 MG tablet Take 4 tablets (2022m) in AM & and 3 tabs (150101m in PM, within 3091mof finishing food. Take on days 1-7 & 15-21 of each 28 day cycle 294 tablet 0  . CINNAMON PO Take 1 tablet by mouth daily.     . clindamycin (CLINDAGEL) 1 % gel Apply topically 2 (two) times daily. 60 g 1  . fluticasone  (FLONASE) 50 MCG/ACT nasal spray Place 1 spray into both nostrils daily. (Patient taking differently: Place 1 spray into both nostrils daily as needed for allergies. ) 16 g 2  . insulin NPH-regular Human (NOVOLIN 70/30) (70-30) 100 UNIT/ML injection Inject 25 Units into the skin 2 (two) times daily with a meal.     . levothyroxine (SYNTHROID, LEVOTHROID) 112 MCG tablet Take 112 mcg by mouth daily before breakfast.    . Omega-3 Fatty Acids (FISH OIL) 1000 MG CAPS Take 1,000 mg by mouth daily.     . polyethylene glycol (MIRALAX / GLYCOLAX) packet Take 17 g by mouth daily. 14 each 0  . propranolol (INDERAL) 10 MG tablet TAKE 1 TABLET BY MOUTH EVERY DAY 90 tablet 0  . TURMERIC PO Take 1 capsule by mouth daily.     . dMarland Kitchenxycycline (VIBRA-TABS) 100 MG tablet Take 1 tablet (100 mg total) by mouth 2 (two) times daily. 60 tablet 2  . methylPREDNISolone (MEDROL DOSEPAK) 4 MG TBPK tablet Take as directed 21 tablet 0   No  current facility-administered medications for this visit.     PHYSICAL EXAMINATION: ECOG PERFORMANCE STATUS: 1 - Symptomatic but completely ambulatory  Vitals:   12/08/17 1431  BP: (!) 144/96  Pulse: 78  Resp: 17  Temp: 98.9 F (37.2 C)  SpO2: 100%   Filed Weights   12/08/17 1431  Weight: 151 lb 1.6 oz (68.5 kg)    GENERAL:alert, no distress and comfortable SKIN: dry, flaky skin to face and scalp with mild acne type rash to face and chest. Mild dryness to thumb pads and plantar erythema without blisters, cracks, or skin breakdown EYES:  sclera clear OROPHARYNX:no thrush or ulcers LYMPH:  no palpable cervical or supraclavicular lymphadenopathy  LUNGS: clear to auscultation with normal breathing effort HEART: regular rate & rhythm and no murmurs and no lower extremity edema ABDOMEN:abdomen soft, non-tender and normal bowel sounds Musculoskeletal:no cyanosis of digits and no clubbing  NEURO: alert & oriented x 3 with fluent speech, no focal motor/sensory deficits  LABORATORY  DATA:  I have reviewed the data as listed CBC Latest Ref Rng & Units 12/08/2017 12/02/2017 11/18/2017  WBC 3.9 - 10.3 K/uL 8.2 9.9 9.5  Hemoglobin 11.6 - 15.9 g/dL 12.8 12.9 12.3  Hematocrit 34.8 - 46.6 % 39.9 39.9 38.9  Platelets 145 - 400 K/uL 223 229 226     CMP Latest Ref Rng & Units 12/08/2017 12/02/2017 11/18/2017  Glucose 70 - 99 mg/dL 158(H) 178(H) 122(H)  BUN 6 - 20 mg/dL _0 Creatinine 0.44 - 1.00 mg/dL 0.72 0.73 0.69  Sodium 135 - 145 mmol/L 142 141 141  Potassium 3.5 - 5.1 mmol/L 3.6 3.6 3.9  Chloride 98 - 111 mmol/L 108 106 106  CO2 22 - 32 mmol/L _1 Calcium 8.9 - 10.3 mg/dL 8.3(L) 8.7(L) 8.3(L)  Total Protein 6.5 - 8.1 g/dL 7.1 7.3 7.4  Total Bilirubin 0.3 - 1.2 mg/dL 0.5 0.5 0.5  Alkaline Phos 38 - 126 U/L 178(H) 182(H) 207(H)  AST 15 - 41 U/L _2 ALT 0 - 44 U/L _3 CEA: 05/31/2017:353.3 07/07/2017: 494.58 08/10/2017: 754.17 08/25/2017: 862.87 (began treatment) 09/22/2017: 61.0 10/06/2017: 28.92 11/05/2017: 66.63 12/02/2017: 11.33  PATHOLOGY REPORT:  Diagnosis 06/11/2017 Liver, needle/core biopsy - METASTATIC ADENOCARCINOMA, CONSISTENT WITH COLONIC PRIMARY.    RADIOGRAPHIC STUDIES: I have personally reviewed the radiological images as listed and agreed with the findings in the report. No results found.   ASSESSMENT & PLAN: 47 y.o.Acac[place] female, with past medical history of diabetes, hypertension, presented with left lower quadrant abdominal pain, hematochezia, fatigue and weight loss  1. Sigmoid adenocarcinoma, with abdominopelvic adenopathy andhepatic metastasis, stage IV, MSI-stable 2. Anemia, iron deficiency  3. DM, HTN, HL 4. Weight loss 5. Genetics  6. Financial and social support - approved for [employer], in process of applying for medicaid as of 9/11 visit  7. Anxiety, depression 8. Goals of care discussion  9. Rash face, chest, scalp, back - not taking doxycycline  10. Unwitnessed ? near- syncopal episode 09/06/17 with  warmth and dizziness 11. Periorbital edemaand facial erythema, possibly related to vectibix 12. Multiple enlarged LN in cervical and previously in inguinal area  13. Acute respiratory infection, nearly resolved   Ms. Staheli appears stable. Her URI has nearly resolved, she did not take antibiotics. She has remained afebrile. Labs are unremarkable. She will continue 1 iron tab daily.  I recommend to proceed with cycle 6 Xeloda and pantiumumab today, she takes 1500 mg BID for  1 week on and 1 week off. She will undergo restaging CT on 9/23. CEA has decreased markedly, down to 11 as of last week, she is likely responding. She will return in 2 weeks for f/u and next cycle. For fatigue, I recommend she increase her physical activity, eat well, and hydrate adequately. She agrees.   For hand/foot dryness, I recommend she use eucerin lotion and keep skin lubricated. If she develops redness, sensitivity, or cracks I recommend hydrocortisone cream. She understands.   Patient requesting vitamin D level, will add on in 2 weeks.   PLAN: -Labs reviewed, proceed with cycle 6 Xeloda 1500 mg BID x1 week on/1 week off and panitumumab -Restaging CT 9/23 -F/u in 2 weeks   -check vitamin D next lab draw  All questions were answered. The patient knows to call the clinic with any problems, questions or concerns. No barriers to learning was detected.     Alla Feeling, NP 12/08/17

## 2017-12-08 ENCOUNTER — Inpatient Hospital Stay (HOSPITAL_BASED_OUTPATIENT_CLINIC_OR_DEPARTMENT_OTHER): Payer: Self-pay | Admitting: Nurse Practitioner

## 2017-12-08 ENCOUNTER — Inpatient Hospital Stay: Payer: Self-pay

## 2017-12-08 ENCOUNTER — Encounter: Payer: Self-pay | Admitting: Nurse Practitioner

## 2017-12-08 ENCOUNTER — Ambulatory Visit: Payer: Self-pay | Admitting: Adult Health

## 2017-12-08 VITALS — BP 144/96 | HR 78 | Temp 98.9°F | Resp 17 | Ht 65.0 in | Wt 151.1 lb

## 2017-12-08 DIAGNOSIS — C787 Secondary malignant neoplasm of liver and intrahepatic bile duct: Secondary | ICD-10-CM

## 2017-12-08 DIAGNOSIS — C19 Malignant neoplasm of rectosigmoid junction: Secondary | ICD-10-CM

## 2017-12-08 DIAGNOSIS — Z79899 Other long term (current) drug therapy: Secondary | ICD-10-CM

## 2017-12-08 DIAGNOSIS — L270 Generalized skin eruption due to drugs and medicaments taken internally: Secondary | ICD-10-CM

## 2017-12-08 DIAGNOSIS — E039 Hypothyroidism, unspecified: Secondary | ICD-10-CM

## 2017-12-08 DIAGNOSIS — C187 Malignant neoplasm of sigmoid colon: Secondary | ICD-10-CM

## 2017-12-08 DIAGNOSIS — J069 Acute upper respiratory infection, unspecified: Secondary | ICD-10-CM

## 2017-12-08 DIAGNOSIS — D500 Iron deficiency anemia secondary to blood loss (chronic): Secondary | ICD-10-CM

## 2017-12-08 DIAGNOSIS — D509 Iron deficiency anemia, unspecified: Secondary | ICD-10-CM

## 2017-12-08 DIAGNOSIS — E6609 Other obesity due to excess calories: Secondary | ICD-10-CM

## 2017-12-08 DIAGNOSIS — Z1321 Encounter for screening for nutritional disorder: Secondary | ICD-10-CM

## 2017-12-08 LAB — CMP (CANCER CENTER ONLY)
ALT: 11 U/L (ref 0–44)
AST: 17 U/L (ref 15–41)
Albumin: 3.3 g/dL — ABNORMAL LOW (ref 3.5–5.0)
Alkaline Phosphatase: 178 U/L — ABNORMAL HIGH (ref 38–126)
Anion gap: 8 (ref 5–15)
BUN: 7 mg/dL (ref 6–20)
CO2: 26 mmol/L (ref 22–32)
Calcium: 8.3 mg/dL — ABNORMAL LOW (ref 8.9–10.3)
Chloride: 108 mmol/L (ref 98–111)
Creatinine: 0.72 mg/dL (ref 0.44–1.00)
GFR, Est AFR Am: >60 mL/min (ref >60)
GFR, Estimated: >60 mL/min (ref >60)
Glucose, Bld: 158 mg/dL — ABNORMAL HIGH (ref 70–99)
Potassium: 3.6 mmol/L (ref 3.5–5.1)
Sodium: 142 mmol/L (ref 135–145)
Total Bilirubin: 0.5 mg/dL (ref 0.3–1.2)
Total Protein: 7.1 g/dL (ref 6.5–8.1)

## 2017-12-08 LAB — CBC WITH DIFFERENTIAL (CANCER CENTER ONLY)
Basophils Absolute: 0.1 10*3/uL (ref 0.0–0.1)
Basophils Relative: 1 %
Eosinophils Absolute: 0.3 10*3/uL (ref 0.0–0.5)
Eosinophils Relative: 4 %
HCT: 39.9 % (ref 34.8–46.6)
Hemoglobin: 12.8 g/dL (ref 11.6–15.9)
Lymphocytes Relative: 29 %
Lymphs Abs: 2.4 10*3/uL (ref 0.9–3.3)
MCH: 26.9 pg (ref 25.1–34.0)
MCHC: 32.2 g/dL (ref 31.5–36.0)
MCV: 83.7 fL (ref 79.5–101.0)
Monocytes Absolute: 0.4 10*3/uL (ref 0.1–0.9)
Monocytes Relative: 5 %
Neutro Abs: 5 10*3/uL (ref 1.5–6.5)
Neutrophils Relative %: 61 %
Platelet Count: 223 10*3/uL (ref 145–400)
RBC: 4.76 MIL/uL (ref 3.70–5.45)
RDW: 26.2 % — ABNORMAL HIGH (ref 11.2–14.5)
WBC Count: 8.2 10*3/uL (ref 3.9–10.3)

## 2017-12-08 LAB — MAGNESIUM: Magnesium: 1.9 mg/dL (ref 1.7–2.4)

## 2017-12-08 MED ORDER — SODIUM CHLORIDE 0.9 % IV SOLN
Freq: Once | INTRAVENOUS | Status: AC
  Administered 2017-12-08: 15:00:00 via INTRAVENOUS
  Filled 2017-12-08: qty 250

## 2017-12-08 MED ORDER — SODIUM CHLORIDE 0.9 % IV SOLN
6.0000 mg/kg | Freq: Once | INTRAVENOUS | Status: AC
  Administered 2017-12-08: 400 mg via INTRAVENOUS
  Filled 2017-12-08: qty 20

## 2017-12-08 NOTE — Patient Instructions (Signed)
[facility] Discharge Instructions for Patients Receiving Chemotherapy  Today you received the following chemotherapy agents: Vectibix.  To help prevent nausea and vomiting after your treatment, we encourage you to take your nausea medication as directed.   If you develop nausea and vomiting that is not controlled by your nausea medication, call the clinic.   BELOW ARE SYMPTOMS THAT SHOULD BE REPORTED IMMEDIATELY:  *FEVER GREATER THAN 100.5 F  *CHILLS WITH OR WITHOUT FEVER  NAUSEA AND VOMITING THAT IS NOT CONTROLLED WITH YOUR NAUSEA MEDICATION  *UNUSUAL SHORTNESS OF BREATH  *UNUSUAL BRUISING OR BLEEDING  TENDERNESS IN MOUTH AND THROAT WITH OR WITHOUT PRESENCE OF ULCERS  *URINARY PROBLEMS  *BOWEL PROBLEMS  UNUSUAL RASH Items with * indicate a potential emergency and should be followed up as soon as possible.  Feel free to call the clinic should you have any questions or concerns. The clinic phone number is [phone].  Please show the CHEMO ALERT CARD at check-in to the Emergency Department and triage nurse.   

## 2017-12-14 ENCOUNTER — Telehealth: Payer: Self-pay | Admitting: *Deleted

## 2017-12-14 ENCOUNTER — Encounter: Payer: Self-pay | Admitting: Hematology

## 2017-12-14 ENCOUNTER — Ambulatory Visit (HOSPITAL_COMMUNITY)
Admission: RE | Admit: 2017-12-14 | Discharge: 2017-12-14 | Disposition: A | Discharge status: Home / Self Care | Payer: Self-pay | Source: Ambulatory Visit | Attending: Hematology | Admitting: Hematology

## 2017-12-14 ENCOUNTER — Encounter: Payer: Self-pay | Admitting: General Practice

## 2017-12-14 ENCOUNTER — Encounter (HOSPITAL_COMMUNITY): Payer: Self-pay | Admitting: Radiology

## 2017-12-14 DIAGNOSIS — C19 Malignant neoplasm of rectosigmoid junction: Secondary | ICD-10-CM | POA: Insufficient documentation

## 2017-12-14 DIAGNOSIS — R590 Localized enlarged lymph nodes: Secondary | ICD-10-CM | POA: Insufficient documentation

## 2017-12-14 DIAGNOSIS — C787 Secondary malignant neoplasm of liver and intrahepatic bile duct: Secondary | ICD-10-CM | POA: Insufficient documentation

## 2017-12-14 DIAGNOSIS — I700 Atherosclerosis of aorta: Secondary | ICD-10-CM | POA: Insufficient documentation

## 2017-12-14 MED ORDER — IOHEXOL 300 MG/ML  SOLN
100.0000 mL | Freq: Once | INTRAMUSCULAR | Status: AC | PRN
  Administered 2017-12-14: 100 mL via INTRAVENOUS

## 2017-12-14 NOTE — Telephone Encounter (Signed)
Pt came by office stating that she has been to social services & they need a letter staging that she can't work.  Informed message would be given to Dr Burr Medico.

## 2017-12-14 NOTE — Progress Notes (Signed)
Patient came in with questions regarding being uninsured. Advised patient she would automatically receive a 55% discount for all services billed through [facility]. Patient has assistance for treatment drugs(Xeloda and Vectibix). Patient also has been approved for the one-time $400 [place]. Reminded her of this and gave her a copy of the approval letter and went over the expense sheet. She has my card for any additional financial questions or concerns.

## 2017-12-14 NOTE — Telephone Encounter (Signed)
Lacie, could you write the letter for her? Thanks much.  Truitt Merle MD

## 2017-12-14 NOTE — Progress Notes (Signed)
[facility] Note  Missed Beverly Schwartz when she came by my office today, so followed up by phone. She is hopeful about her results from the scan today because of encouraging lab results. In particular she wanted to process some relationship insights and new self-awareness about her own strength and needs. Bore witness to these reflections, providing affirmation and encouragement--namely, that she is in a stronger place emotionally now than she was at the time of her diagnosis. We plan to f/u in infusion on 10/2.   [place], [place], [facility] Pager [phone] Voicemail [phone]

## 2017-12-16 ENCOUNTER — Ambulatory Visit: Payer: Self-pay | Admitting: Hematology

## 2017-12-16 ENCOUNTER — Encounter: Payer: Self-pay | Admitting: Nutrition

## 2017-12-16 ENCOUNTER — Other Ambulatory Visit: Payer: Self-pay

## 2017-12-16 ENCOUNTER — Ambulatory Visit: Payer: Self-pay

## 2017-12-17 ENCOUNTER — Telehealth: Payer: Self-pay | Admitting: Nurse Practitioner

## 2017-12-17 NOTE — Telephone Encounter (Signed)
I called patient to review CT scan which shows dramatic response to treatment in the liver and more mild decrease in abdominopelvic adenopathy and definition of the rectosigmoid mass. Dr. Burr Medico and I reviewed together. The recommendation is to continue therapy. Patient understands and is pleased. She will return next week for treatment and appreciates the call. I will provider the letter she is requesting and release it to her via mychart.

## 2017-12-18 ENCOUNTER — Encounter: Payer: Self-pay | Admitting: Nurse Practitioner

## 2017-12-18 NOTE — Telephone Encounter (Signed)
Done. I sent it to her via mychart.  Regan Rakers

## 2017-12-23 ENCOUNTER — Other Ambulatory Visit: Payer: Self-pay | Admitting: Emergency Medicine

## 2017-12-23 ENCOUNTER — Inpatient Hospital Stay: Payer: Self-pay

## 2017-12-23 ENCOUNTER — Inpatient Hospital Stay: Payer: Self-pay | Attending: Hematology

## 2017-12-23 ENCOUNTER — Encounter: Payer: Self-pay | Admitting: Nurse Practitioner

## 2017-12-23 ENCOUNTER — Telehealth: Payer: Self-pay | Admitting: Nurse Practitioner

## 2017-12-23 ENCOUNTER — Inpatient Hospital Stay: Payer: Self-pay | Admitting: Nutrition

## 2017-12-23 ENCOUNTER — Inpatient Hospital Stay (HOSPITAL_BASED_OUTPATIENT_CLINIC_OR_DEPARTMENT_OTHER): Payer: Self-pay | Admitting: Nurse Practitioner

## 2017-12-23 VITALS — BP 148/94 | HR 93 | Temp 98.1°F | Resp 17 | Ht 65.0 in | Wt 157.8 lb

## 2017-12-23 DIAGNOSIS — L271 Localized skin eruption due to drugs and medicaments taken internally: Secondary | ICD-10-CM

## 2017-12-23 DIAGNOSIS — Z79899 Other long term (current) drug therapy: Secondary | ICD-10-CM | POA: Insufficient documentation

## 2017-12-23 DIAGNOSIS — R05 Cough: Secondary | ICD-10-CM | POA: Insufficient documentation

## 2017-12-23 DIAGNOSIS — D500 Iron deficiency anemia secondary to blood loss (chronic): Secondary | ICD-10-CM

## 2017-12-23 DIAGNOSIS — J3489 Other specified disorders of nose and nasal sinuses: Secondary | ICD-10-CM | POA: Insufficient documentation

## 2017-12-23 DIAGNOSIS — F419 Anxiety disorder, unspecified: Secondary | ICD-10-CM

## 2017-12-23 DIAGNOSIS — R21 Rash and other nonspecific skin eruption: Secondary | ICD-10-CM | POA: Insufficient documentation

## 2017-12-23 DIAGNOSIS — E119 Type 2 diabetes mellitus without complications: Secondary | ICD-10-CM

## 2017-12-23 DIAGNOSIS — C19 Malignant neoplasm of rectosigmoid junction: Secondary | ICD-10-CM

## 2017-12-23 DIAGNOSIS — C787 Secondary malignant neoplasm of liver and intrahepatic bile duct: Secondary | ICD-10-CM

## 2017-12-23 DIAGNOSIS — L270 Generalized skin eruption due to drugs and medicaments taken internally: Secondary | ICD-10-CM

## 2017-12-23 DIAGNOSIS — C187 Malignant neoplasm of sigmoid colon: Secondary | ICD-10-CM

## 2017-12-23 DIAGNOSIS — I10 Essential (primary) hypertension: Secondary | ICD-10-CM | POA: Insufficient documentation

## 2017-12-23 DIAGNOSIS — Z1321 Encounter for screening for nutritional disorder: Secondary | ICD-10-CM

## 2017-12-23 DIAGNOSIS — F418 Other specified anxiety disorders: Secondary | ICD-10-CM | POA: Insufficient documentation

## 2017-12-23 DIAGNOSIS — Z5112 Encounter for antineoplastic immunotherapy: Secondary | ICD-10-CM | POA: Insufficient documentation

## 2017-12-23 DIAGNOSIS — T451X5A Adverse effect of antineoplastic and immunosuppressive drugs, initial encounter: Secondary | ICD-10-CM | POA: Insufficient documentation

## 2017-12-23 LAB — CBC WITH DIFFERENTIAL (CANCER CENTER ONLY)
Basophils Absolute: 0.1 10*3/uL (ref 0.0–0.1)
Basophils Relative: 1 %
Eosinophils Absolute: 0.4 10*3/uL (ref 0.0–0.5)
Eosinophils Relative: 4 %
HCT: 40.8 % (ref 34.8–46.6)
Hemoglobin: 13.1 g/dL (ref 11.6–15.9)
Lymphocytes Relative: 30 %
Lymphs Abs: 2.7 10*3/uL (ref 0.9–3.3)
MCH: 27.8 pg (ref 25.1–34.0)
MCHC: 32.1 g/dL (ref 31.5–36.0)
MCV: 86.4 fL (ref 79.5–101.0)
Monocytes Absolute: 0.6 10*3/uL (ref 0.1–0.9)
Monocytes Relative: 7 %
Neutro Abs: 5.3 10*3/uL (ref 1.5–6.5)
Neutrophils Relative %: 58 %
Platelet Count: 225 10*3/uL (ref 145–400)
RBC: 4.72 MIL/uL (ref 3.70–5.45)
RDW: 21.2 % — ABNORMAL HIGH (ref 11.2–14.5)
WBC Count: 9 10*3/uL (ref 3.9–10.3)

## 2017-12-23 LAB — CMP (CANCER CENTER ONLY)
ALT: 16 U/L (ref 0–44)
AST: 19 U/L (ref 15–41)
Albumin: 3.2 g/dL — ABNORMAL LOW (ref 3.5–5.0)
Alkaline Phosphatase: 163 U/L — ABNORMAL HIGH (ref 38–126)
Anion gap: 10 (ref 5–15)
BUN: 7 mg/dL (ref 6–20)
CO2: 24 mmol/L (ref 22–32)
Calcium: 9 mg/dL (ref 8.9–10.3)
Chloride: 108 mmol/L (ref 98–111)
Creatinine: 0.65 mg/dL (ref 0.44–1.00)
GFR, Est AFR Am: >60 mL/min (ref >60)
GFR, Estimated: >60 mL/min (ref >60)
Glucose, Bld: 104 mg/dL — ABNORMAL HIGH (ref 70–99)
Potassium: 3.5 mmol/L (ref 3.5–5.1)
Sodium: 142 mmol/L (ref 135–145)
Total Bilirubin: 0.4 mg/dL (ref 0.3–1.2)
Total Protein: 7.1 g/dL (ref 6.5–8.1)

## 2017-12-23 LAB — MAGNESIUM: Magnesium: 1.7 mg/dL (ref 1.7–2.4)

## 2017-12-23 MED ORDER — SODIUM CHLORIDE 0.9 % IV SOLN
Freq: Once | INTRAVENOUS | Status: AC
  Administered 2017-12-23: 15:00:00 via INTRAVENOUS
  Filled 2017-12-23: qty 250

## 2017-12-23 MED ORDER — SODIUM CHLORIDE 0.9 % IV SOLN
6.0000 mg/kg | Freq: Once | INTRAVENOUS | Status: AC
  Administered 2017-12-23: 400 mg via INTRAVENOUS
  Filled 2017-12-23: qty 20

## 2017-12-23 MED ORDER — ALPRAZOLAM 1 MG PO TABS: 1.0000 mg | ORAL_TABLET | Freq: Every evening | ORAL | 0 refills | Status: DC | PRN

## 2017-12-23 MED ORDER — CAPECITABINE 500 MG PO TABS: ORAL_TABLET | ORAL | 0 refills | Status: DC

## 2017-12-23 MED FILL — ALPRAZolam 1 MG TABS: 1 | 30 days supply | Qty: 30 | Fill #0

## 2017-12-23 NOTE — Patient Instructions (Signed)
[facility] Discharge Instructions for Patients Receiving Chemotherapy  Today you received the following chemotherapy agents: Vectibix.  To help prevent nausea and vomiting after your treatment, we encourage you to take your nausea medication as directed.   If you develop nausea and vomiting that is not controlled by your nausea medication, call the clinic.   BELOW ARE SYMPTOMS THAT SHOULD BE REPORTED IMMEDIATELY:  *FEVER GREATER THAN 100.5 F  *CHILLS WITH OR WITHOUT FEVER  NAUSEA AND VOMITING THAT IS NOT CONTROLLED WITH YOUR NAUSEA MEDICATION  *UNUSUAL SHORTNESS OF BREATH  *UNUSUAL BRUISING OR BLEEDING  TENDERNESS IN MOUTH AND THROAT WITH OR WITHOUT PRESENCE OF ULCERS  *URINARY PROBLEMS  *BOWEL PROBLEMS  UNUSUAL RASH Items with * indicate a potential emergency and should be followed up as soon as possible.  Feel free to call the clinic should you have any questions or concerns. The clinic phone number is [phone].  Please show the CHEMO ALERT CARD at check-in to the Emergency Department and triage nurse.   

## 2017-12-23 NOTE — Progress Notes (Addendum)
[place]  [phone] [phone]  Clinic Follow up Note   Patient Care Team: Esaw Grandchild, NP as PCP - General (Family Medicine) Delrae Rend, MD as Consulting Physician (Endocrinology) Truitt Merle, MD as Consulting Physician (Hematology) Alla Feeling, NP as Nurse Practitioner (Nurse Practitioner) Clarene Essex, MD as Consulting Physician (Gastroenterology) 12/23/2017  SUMMARY OF ONCOLOGIC HISTORY: Oncology History   Cancer Staging Malignant neoplasm of rectosigmoid junction [facility]) Staging form: Colon and Rectum, AJCC 8th Edition - Clinical stage from 06/11/2017: Stage IVA (cTX, cN1b, pM1a) - Signed by Truitt Merle, MD on 06/15/2017       Malignant neoplasm of rectosigmoid junction ([place])   05/30/2017 Imaging    CT AP IMPRESSION: 1. 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 Abdominopelvic adenopathy. Rectosigmoid mass with suggestion of partial obstruction as evidenced by large colonic stool burden more proximally. 2.  Aortic Atherosclerosis (ICD10-I70.0).  This is age advanced.    05/31/2017 Tumor Marker    CEA 353.3 (elevated) AFP 4.5 (normal)    05/31/2017 Initial Biopsy    Diagnosis Colon, biopsy, sigmoid - INVASIVE ADENOCARCINOMA - SEE COMMENT    05/31/2017 Imaging    CT CHEST IMPRESSION: 1. No evidence of metastatic disease in the chest. 2. No acute findings.  Aortic Atherosclerosis (ICD10-I70.0).     05/31/2017 Procedure    Colonoscopy per Dr. Watt Climes Findings: An infiltrative and ulcerated partially obstructing medium-sized mass was found in the recto-sigmoid colon. The mass was circumferential. The mass measured four cm in length. No bleeding was present.   Impression - One small polyp in the rectum. - The examination was otherwise normal. - Malignant partially obstructing tumor in the recto-sigmoid colon. Biopsied. - One medium polyp in the proximal sigmoid  colon. - The examination was otherwise normal. - Internal hemorrhoids.    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 Mild progression of hepatic metastasis. 2. Similar rectosigmoid primary with abdominopelvic nodal metastasis. 3.  No acute process or evidence of metastatic disease in the chest. 4. Aortic Atherosclerosis (ICD10-I70.0). Coronary artery atherosclerosis. This is age advanced. 5. Proximal colonic constipation, again suggesting a component of partial obstruction at the primary site. 6. Mild ascending aortic dilatation at 4.1 cm.    08/25/2017 -  Chemotherapy    -Xeloda '1500mg'$  BID 1 week on and 1 week off starting 08/25/17 -Vectibix every 2 weeks starting 08/25/17    12/14/2017 Imaging    IMPRESSION: 1. Response to therapy. Marked decrease in hepatic metastatic burden. More mild decrease in abdominopelvic adenopathy and definition of rectosigmoid primary. 2.  No acute process or evidence of metastatic disease in the chest. 3.  Aortic Atherosclerosis (ICD10-I70.0).    CURRENT THERAPY:  -Xeloda '1500mg'$  BID 1 week on and 1 week off starting 08/25/17. Increased to '2000mg'$  in the AM and '1500mg'$  in the PM starting 09/08/17,dose was reduced to 1500 mg every 12 hours, 1 week on and one week of subsequently due to skin rash -Vectibix every 2 weeks starting 08/25/17  INTERVAL HISTORY: Ms. Giovanni returns for follow up and next cycle Xeloda/vectibix as scheduled.  She returns with her mother. Completed cycle 6 on 9/12She continues to report fatigue that fluctuates in severity. For last 2 weeks she has bilateral upper leg muscle soreness, as if she had  exercised but she has not. Often makes it difficult to get motivated for certain errands but overall remains functional. Her scalp rash itches and skin remains flushed and dry. She uses sensitive shampoo which helps with itching. She does not use steroids on skin or take doxy. No hand-foot redness or pain, no neuropathy. Appetite is normal. Very minimal abdominal pain intermittently. Denies mucositis. No n/v/c/d. Denies blood in stool.    MEDICAL HISTORY:  Past Medical History:  Diagnosis Date  . Anxiety   . Cancer ([place])    colon, liver  . Chest pain   . Colon cancer ([place])   . Complication of anesthesia   . Depression   . Diabetes mellitus   . Dizziness   . Family history of breast cancer   . Family history of stomach cancer   . Hyperlipidemia   . Hypertension   . Hypothyroidism   . Obesity   . PONV (postoperative nausea and vomiting)   . Tachycardia     SURGICAL HISTORY: Past Surgical History:  Procedure Laterality Date  . FLEXIBLE SIGMOIDOSCOPY N/A 05/31/2017   Procedure: FLEXIBLE SIGMOIDOSCOPY;  Surgeon: Clarene Essex, MD;  Location: [facility] ENDOSCOPY;  Service: Endoscopy;  Laterality: N/A;  . WISDOM TOOTH EXTRACTION     age 26's    I have reviewed the social history and family history with the patient and they are unchanged from previous note.  ALLERGIES:  is allergic to bupropion and amoxicillin.  MEDICATIONS:  Current Outpatient Medications  Medication Sig Dispense Refill  . ALPRAZolam (XANAX) 1 MG tablet Take 1 tablet (1 mg total) by mouth at bedtime as needed for anxiety. 30 tablet 0  . benzonatate (TESSALON) 100 MG capsule Take 1 capsule (100 mg total) by mouth 3 (three) times daily as needed for cough. 20 capsule 0  . capecitabine (XELODA) 500 MG tablet Take 4 tablets ('2000mg'$ ) in AM & and 3 tabs ('1500mg'$ ) in PM, within 82mn of finishing food. Take on days 1-7 & 15-21 of each 28 day cycle 294 tablet 0  . CINNAMON PO Take 1 tablet by mouth daily.     . fluticasone  (FLONASE) 50 MCG/ACT nasal spray Place 1 spray into both nostrils daily. (Patient taking differently: Place 1 spray into both nostrils daily as needed for allergies. ) 16 g 2  . insulin NPH-regular Human (NOVOLIN 70/30) (70-30) 100 UNIT/ML injection Inject 25 Units into the skin 2 (two) times daily with a meal.     . levothyroxine (SYNTHROID, LEVOTHROID) 112 MCG tablet Take 112 mcg by mouth daily before breakfast.    . methylPREDNISolone (MEDROL DOSEPAK) 4 MG TBPK tablet Take as directed 21 tablet 0  . Omega-3 Fatty Acids (FISH OIL) 1000 MG CAPS Take 1,000 mg by mouth daily.     . polyethylene glycol (MIRALAX / GLYCOLAX) packet Take 17 g by mouth daily. 14 each 0  . propranolol (INDERAL) 10 MG tablet TAKE 1 TABLET BY MOUTH EVERY DAY 90 tablet 0  . TURMERIC PO Take 1 capsule by mouth daily.     .Marland Kitchenacetaminophen-codeine (TYLENOL #3) 300-30 MG tablet Take 1 tablet by mouth every 6 (six) hours as needed for moderate pain. 30 tablet 0  . atorvastatin (LIPITOR) 10 MG tablet Take 1 tablet (10 mg total) by mouth daily. 90 tablet 3  .  azithromycin (ZITHROMAX Z-PAK) 250 MG tablet Take as prescribed 6 each 0  . clindamycin (CLINDAGEL) 1 % gel Apply topically 2 (two) times daily. 60 g 1  . doxycycline (VIBRA-TABS) 100 MG tablet Take 1 tablet (100 mg total) by mouth 2 (two) times daily. 60 tablet 2   No current facility-administered medications for this visit.    Facility-Administered Medications Ordered in Other Visits  Medication Dose Route Frequency Provider Last Rate Last Dose  . panitumumab (VECTIBIX) 400 mg in sodium chloride 0.9 % 100 mL chemo infusion  6 mg/kg (Treatment Plan Recorded) Intravenous Once Truitt Merle, MD        PHYSICAL EXAMINATION: ECOG PERFORMANCE STATUS: 1 - Symptomatic but completely ambulatory  Vitals:   12/23/17 1353  BP: (!) 148/94  Pulse: 93  Resp: 17  Temp: 98.1 F (36.7 C)  SpO2: 100%   Filed Weights   12/23/17 1353  Weight: 157 lb 12.8 oz (71.6 kg)     GENERAL:alert, no distress and comfortable SKIN: dry flaky skin with scattered acne type rash to scalp, face, and chest  EYES: sclera clear OROPHARYNX:no thrush or ulcers  LYMPH:  no palpable cervical or supraclavicular lymphadenopathy LUNGS: clear to auscultation with normal breathing effort HEART: regular rate & rhythm, no lower extremity edema ABDOMEN:abdomen soft, non-tender and normal bowel sounds Musculoskeletal:no cyanosis of digits and no clubbing  NEURO: alert & oriented x 3 with fluent speech, no focal motor/sensory deficits  LABORATORY DATA:  I have reviewed the data as listed CBC Latest Ref Rng & Units 12/23/2017 12/08/2017 12/02/2017  WBC 3.9 - 10.3 K/uL 9.0 8.2 9.9  Hemoglobin 11.6 - 15.9 g/dL 13.1 12.8 12.9  Hematocrit 34.8 - 46.6 % 40.8 39.9 39.9  Platelets 145 - 400 K/uL 225 223 229     CMP Latest Ref Rng & Units 12/23/2017 12/08/2017 12/02/2017  Glucose 70 - 99 mg/dL 104(H) 158(H) 178(H)  BUN 6 - 20 mg/dL '7 7 8  '$ Creatinine 0.44 - 1.00 mg/dL 0.65 0.72 0.73  Sodium 135 - 145 mmol/L 142 142 141  Potassium 3.5 - 5.1 mmol/L 3.5 3.6 3.6  Chloride 98 - 111 mmol/L 108 108 106  CO2 22 - 32 mmol/L '24 26 24  '$ Calcium 8.9 - 10.3 mg/dL 9.0 8.3(L) 8.7(L)  Total Protein 6.5 - 8.1 g/dL 7.1 7.1 7.3  Total Bilirubin 0.3 - 1.2 mg/dL 0.4 0.5 0.5  Alkaline Phos 38 - 126 U/L 163(H) 178(H) 182(H)  AST 15 - 41 U/L '19 17 17  '$ ALT 0 - 44 U/L '16 11 9   '$ CEA: 05/31/2017:353.3 07/07/2017: 494.58 08/10/2017: 754.17 08/25/2017: 862.87 (began treatment) 09/22/2017: 61.0 10/06/2017: 28.92 11/05/2017: 66.63 12/02/2017: 11.33  PATHOLOGY REPORT:  Diagnosis 06/11/2017 Liver, needle/core biopsy - METASTATIC ADENOCARCINOMA, CONSISTENT WITH COLONIC PRIMARY.   RADIOGRAPHIC STUDIES: I have personally reviewed the radiological images as listed and agreed with the findings in the report. No results found.   ASSESSMENT & PLAN: 47y.o.[place] female, with past medical history of diabetes,  hypertension, presented with left lower quadrant abdominal pain, hematochezia, fatigue and weight loss  1. Sigmoid adenocarcinoma, with abdominopelvic adenopathy andhepatic metastasis, stage IV, MSI-stable 2. Anemia, iron deficiency  3. DM, HTN, HL 4. Weight loss 5. Genetics  6. Financial and social support- approved for [employer], denied medicaid  7. Anxiety, depression - stable 8. Goals of care discussion  9. Rash face, chest, scalp, back- not taking doxycycline 10. Unwitnessed ? near- syncopal episode 09/06/17 with warmth and dizziness 11. Periorbital edemaand facial erythema, possibly  related to vectibix, resolved  12. Multiple enlarged LN in cervical and previously in inguinal area  13. Acute respiratory infection, nearly resolved   Ms. Daudelin appears stable. She completed 6 cycles Xeloda and panitumumab. She tolerates treatment very well except mild fatigue and skin rash. She prefers natural remedies for her rash and does not use doxy or steroids. She underwent restaging CT that shows response to therapy with marked decrease in hepatic metastatic burden, and mild decrease in primary colon mass and abdominopelvic adenopathy. Dr. Burr Medico reviewed the images with patient and her mother in today's visit. Recommend to continue current regimen with panitumumab q2 weeks and Xeloda 1500 mg BID 1 week on and 1 week off. Will scan in 3-4 months.   Patient asked questions about whether she is a surgical candidate now. Dr. Burr Medico reviewed while she is not a surgical candidate at this point due to diffuse liver metastasis, if she continues to have a great response to therapy, may obtain PET in the future and if only a few lesions she may be a candidate for local liver therapy such as ablation. Patient is pleased with her response, agrees to continue. Will refill Xeloda.   PLAN: -Labs reviewed, Vit D pending  -Proceed with cycle 7 panitumumab, continue q2 weeks, and Xeloda 1500 mg BID 1 week on/1  week off, refilled  -Return for lab, f/u and next cycle in 2 weeks  -Refilled xanax   All questions were answered. The patient knows to call the clinic with any problems, questions or concerns. No barriers to learning was detected.     Alla Feeling, NP 12/23/17   Addendum  I have seen the patient, examined her. I agree with the assessment and and plan and have edited the notes.   Akyra is clinically doing well, and tolerating treatment well.  I have reviewed her restaging CT scan personally, agree with the radiology interpretation.  I showed her scan images, and compared to her last CT scan.  She has had dramatic response to treatment, her liver metastasis has decreased more than 50%, and some small lesions has resolved.  No other new lesions.  She is very happy with the findings, and agrees to continue current treatment.  She still has no insurance, will continue to replace her vectibix, and she has a financial support for Xeloda.  Truitt Merle  12/23/2017

## 2017-12-23 NOTE — Telephone Encounter (Signed)
Patient called to cancel the nutrition part of appointments only

## 2017-12-24 ENCOUNTER — Telehealth: Payer: Self-pay | Admitting: Hematology

## 2017-12-24 LAB — VITAMIN D 25 HYDROXY (VIT D DEFICIENCY, FRACTURES): Vit D, 25-Hydroxy: 16.2 ng/mL — ABNORMAL LOW (ref 30.0–100.0)

## 2017-12-24 MED FILL — XELODA 500 MG TABLET: 500 | 84 days supply | Qty: 294 | Fill #0

## 2017-12-24 NOTE — Telephone Encounter (Signed)
No los 1/02. °

## 2017-12-25 ENCOUNTER — Telehealth: Payer: Self-pay

## 2017-12-25 ENCOUNTER — Other Ambulatory Visit: Payer: Self-pay | Admitting: Nurse Practitioner

## 2017-12-25 DIAGNOSIS — E559 Vitamin D deficiency, unspecified: Secondary | ICD-10-CM

## 2017-12-25 MED ORDER — ERGOCALCIFEROL 1.25 MG (50000 UT) PO CAPS: 50000.0000 [IU] | ORAL_CAPSULE | ORAL | 0 refills | Status: DC

## 2017-12-25 NOTE — Telephone Encounter (Signed)
Spoke with patient per Beverly Schwartz Vitamin D is low, script has been sent into [facility], take one pill weekly for 6 weeks, then we will recheck her level, she verbalized an understanding.

## 2017-12-25 NOTE — Telephone Encounter (Signed)
-----   Message from Alla Feeling, NP sent at 12/25/2017  3:40 PM EDT ----- Please let her know vitamin D is low, I sent in Rx to begin vitamin D 50,000 IU weekly, for 6 weeks. Will repeat vitamin D at that time.  Thanks, Regan Rakers NP

## 2018-01-05 NOTE — Progress Notes (Signed)
[place]  [phone] [phone]  Clinic Follow up Note   Patient Care Team: Esaw Grandchild, NP as PCP - General (Family Medicine) Delrae Rend, MD as Consulting Physician (Endocrinology) Truitt Merle, MD as Consulting Physician (Hematology) Alla Feeling, NP as Nurse Practitioner (Nurse Practitioner) Clarene Essex, MD as Consulting Physician (Gastroenterology) 01/06/2018  SUMMARY OF ONCOLOGIC HISTORY: Oncology History   Cancer Staging Malignant neoplasm of rectosigmoid junction [facility]) Staging form: Colon and Rectum, AJCC 8th Edition - Clinical stage from 06/11/2017: Stage IVA (cTX, cN1b, pM1a) - Signed by Truitt Merle, MD on 06/15/2017       Malignant neoplasm of rectosigmoid junction ([place])   05/30/2017 Imaging    CT AP IMPRESSION: 1. 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 Abdominopelvic adenopathy. Rectosigmoid mass with suggestion of partial obstruction as evidenced by large colonic stool burden more proximally. 2.  Aortic Atherosclerosis (ICD10-I70.0).  This is age advanced.    05/31/2017 Tumor Marker    CEA 353.3 (elevated) AFP 4.5 (normal)    05/31/2017 Initial Biopsy    Diagnosis Colon, biopsy, sigmoid - INVASIVE ADENOCARCINOMA - SEE COMMENT    05/31/2017 Imaging    CT CHEST IMPRESSION: 1. No evidence of metastatic disease in the chest. 2. No acute findings.  Aortic Atherosclerosis (ICD10-I70.0).     05/31/2017 Procedure    Colonoscopy per Dr. Watt Climes Findings: An infiltrative and ulcerated partially obstructing medium-sized mass was found in the recto-sigmoid colon. The mass was circumferential. The mass measured four cm in length. No bleeding was present.   Impression - One small polyp in the rectum. - The examination was otherwise normal. - Malignant partially obstructing tumor in the recto-sigmoid colon. Biopsied. - One medium polyp in the proximal sigmoid  colon. - The examination was otherwise normal. - Internal hemorrhoids.    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 Mild progression of hepatic metastasis. 2. Similar rectosigmoid primary with abdominopelvic nodal metastasis. 3.  No acute process or evidence of metastatic disease in the chest. 4. Aortic Atherosclerosis (ICD10-I70.0). Coronary artery atherosclerosis. This is age advanced. 5. Proximal colonic constipation, again suggesting a component of partial obstruction at the primary site. 6. Mild ascending aortic dilatation at 4.1 cm.    08/25/2017 -  Chemotherapy    -Xeloda '1500mg'$  BID 1 week on and 1 week off starting 08/25/17 -Vectibix every 2 weeks starting 08/25/17    12/14/2017 Imaging    IMPRESSION: 1. Response to therapy. Marked decrease in hepatic metastatic burden. More mild decrease in abdominopelvic adenopathy and definition of rectosigmoid primary. 2.  No acute process or evidence of metastatic disease in the chest. 3.  Aortic Atherosclerosis (ICD10-I70.0).    CURRENT THERAPY:  -Xeloda '1500mg'$  BID 1 week on and 1 week off starting 08/25/17. Increased to '2000mg'$  in the AM and '1500mg'$  in the PM starting 09/08/17,dose was reduced to 1500 mg every 12 hours, 1 week on and one week of subsequently due to skin rash -Vectibix every 2 weeks starting 08/25/17   INTERVAL HISTORY: Mr. Sara returns for follow up and cycle 8 xeloda/panitumumab as  scheduled. She completed last cycle on 10/2. She continues to have itching scalp and dry flaky skin to her face with no significant rash. She has some dryness and peeling to her hands, soles of her feet  are slightly red and 1 crack on the right heel. Feet are more sensitive. Denies neuropathy. She applies lotion daily. She is fatigued, will sleep up to 2 hours during the day and has difficulty sleeping at night. Eating and drinking well. Has occasional transient abdominal and epigastric pain, especially after meals. She saw small amount of blood in stool once since last visit. Takes iron tab 1 daily. She continues to have cough, occasionally productive with white/yellow phlegm and clear rhinorrhea. BG has fluctuated lately. No fever, chills, chest pain, or dyspnea.    MEDICAL HISTORY:  Past Medical History:  Diagnosis Date  . Anxiety   . Cancer ([place])    colon, liver  . Chest pain   . Colon cancer ([place])   . Complication of anesthesia   . Depression   . Diabetes mellitus   . Dizziness   . Family history of breast cancer   . Family history of stomach cancer   . Hyperlipidemia   . Hypertension   . Hypothyroidism   . Obesity   . PONV (postoperative nausea and vomiting)   . Tachycardia     SURGICAL HISTORY: Past Surgical History:  Procedure Laterality Date  . FLEXIBLE SIGMOIDOSCOPY N/A 05/31/2017   Procedure: FLEXIBLE SIGMOIDOSCOPY;  Surgeon: Clarene Essex, MD;  Location: [facility];  Service: Endoscopy;  Laterality: N/A;  . WISDOM TOOTH EXTRACTION     age 65's    I have reviewed the social history and family history with the patient and they are unchanged from previous note.  ALLERGIES:  is allergic to bupropion and amoxicillin.  MEDICATIONS:  Current Outpatient Medications  Medication Sig Dispense Refill  . acetaminophen-codeine (TYLENOL #3) 300-30 MG tablet Take 1 tablet by mouth every 6 (six) hours as needed for moderate pain. 30 tablet 0  . ALPRAZolam (XANAX) 1 MG tablet Take 1 tablet (1 mg total) by mouth at bedtime as needed for anxiety. 30 tablet 0  . atorvastatin (LIPITOR) 10 MG tablet Take 1 tablet (10 mg total) by mouth daily. 90 tablet 3  . benzonatate (TESSALON) 100  MG capsule Take 1 capsule (100 mg total) by mouth 3 (three) times daily as needed for cough. 20 capsule 0  . capecitabine (XELODA) 500 MG tablet Take 4 tablets (2064m) in AM & and 3 tabs (15064m in PM, within 3084mof finishing food. Take on days 1-7 & 15-21 of each 28 day cycle 294 tablet 0  . CINNAMON PO Take 1 tablet by mouth daily.     . clindamycin (CLINDAGEL) 1 % gel Apply topically 2 (two) times daily. 60 g 1  . ergocalciferol (VITAMIN D2) 50000 units capsule Take 1 capsule (50,000 Units total) by mouth once a week. 6 capsule 0  . fluticasone (FLONASE) 50 MCG/ACT nasal spray Place 1 spray into both nostrils daily. (Patient taking differently: Place 1 spray into both nostrils daily as needed for allergies. ) 16 g 2  . insulin NPH-regular Human (NOVOLIN 70/30) (70-30) 100 UNIT/ML injection Inject 25 Units into the skin 2 (two) times daily with a meal.     . levothyroxine (SYNTHROID, LEVOTHROID) 112 MCG tablet Take 112 mcg by mouth daily before breakfast.    . Omega-3 Fatty Acids (FISH OIL) 1000 MG CAPS Take 1,000 mg by mouth daily.     .Marland Kitchen  polyethylene glycol (MIRALAX / GLYCOLAX) packet Take 17 g by mouth daily. 14 each 0  . propranolol (INDERAL) 10 MG tablet TAKE 1 TABLET BY MOUTH EVERY DAY 90 tablet 0  . TURMERIC PO Take 1 capsule by mouth daily.     Marland Kitchen azithromycin (ZITHROMAX Z-PAK) 250 MG tablet Take as prescribed 6 each 0  . cetirizine (ZYRTEC ALLERGY) 10 MG tablet Take 1 tablet (10 mg total) by mouth daily. 30 tablet 1  . doxycycline (VIBRA-TABS) 100 MG tablet Take 1 tablet (100 mg total) by mouth 2 (two) times daily. 60 tablet 2  . magnesium oxide (MAG-OX) 400 (241.3 Mg) MG tablet Take 1 tablet (400 mg total) by mouth daily. 30 tablet 1  . methylPREDNISolone (MEDROL DOSEPAK) 4 MG TBPK tablet Take as directed 21 tablet 0   No current facility-administered medications for this visit.     PHYSICAL EXAMINATION: ECOG PERFORMANCE STATUS: 1 - Symptomatic but completely ambulatory  Vitals:    01/06/18 1143  BP: (!) 141/92  Pulse: 78  Resp: 19  Temp: 98.5 F (36.9 C)  SpO2: 100%   Filed Weights   01/06/18 1143  Weight: 156 lb 8 oz (71 kg)    GENERAL:alert, no distress and comfortable SKIN: scattered acne type rash to upper chest, mild erythema and dry skin to face and scalp. Mild erythema to soles of feet with crack on right heel  EYES:  sclera clear OROPHARYNX:no thrush or ulcers  LYMPH:  no palpable cervical or supraclavicular lymphadenopathy LUNGS: clear to auscultation with normal breathing effort HEART: regular rate & rhythm, no lower extremity edema ABDOMEN:abdomen soft, non-tender and normal bowel sounds Musculoskeletal:no cyanosis of digits and no clubbing  NEURO: alert & oriented x 3 with fluent speech, no focal motor/sensory deficits  LABORATORY DATA:  I have reviewed the data as listed CBC Latest Ref Rng & Units 01/06/2018 12/23/2017 12/08/2017  WBC 4.0 - 10.5 K/uL 8.0 9.0 8.2  Hemoglobin 12.0 - 15.0 g/dL 13.9 13.1 12.8  Hematocrit 36.0 - 46.0 % 43.2 40.8 39.9  Platelets 150 - 400 K/uL 189 225 223     CMP Latest Ref Rng & Units 01/06/2018 12/23/2017 12/08/2017  Glucose 70 - 99 mg/dL 210(H) 104(H) 158(H)  BUN 6 - 20 mg/dL _0 Creatinine 0.44 - 1.00 mg/dL 0.73 0.65 0.72  Sodium 135 - 145 mmol/L 141 142 142  Potassium 3.5 - 5.1 mmol/L 3.9 3.5 3.6  Chloride 98 - 111 mmol/L 106 108 108  CO2 22 - 32 mmol/L _1 Calcium 8.9 - 10.3 mg/dL 8.2(L) 9.0 8.3(L)  Total Protein 6.5 - 8.1 g/dL 7.5 7.1 7.1  Total Bilirubin 0.3 - 1.2 mg/dL 0.6 0.4 0.5  Alkaline Phos 38 - 126 U/L 166(H) 163(H) 178(H)  AST 15 - 41 U/L _2 ALT 0 - 44 U/L _3 CEA: 05/31/2017:353.3 07/07/2017: 494.58 08/10/2017: 754.17 08/25/2017: 862.87 (began treatment) 09/22/2017: 61.0 10/06/2017: 28.92 11/05/2017: 66.63 12/02/2017: 11.33 01/06/18: 8.08  PATHOLOGY REPORT:  Diagnosis 06/11/2017 Liver, needle/core biopsy - METASTATIC ADENOCARCINOMA, CONSISTENT WITH COLONIC  PRIMARY.    RADIOGRAPHIC STUDIES: I have personally reviewed the radiological images as listed and agreed with the findings in the report. No results found.   ASSESSMENT & PLAN: 47y.o.caucasian female, with past medical history of diabetes, hypertension, presented with left lower quadrant abdominal pain, hematochezia, fatigue and weight loss  1. Sigmoid adenocarcinoma, with abdominopelvic adenopathy andhepatic metastasis, stage IV, MSI-stable 2. Anemia, iron deficiency  3.  DM, HTN, HL 4. Weight loss 5. Genetics  6. Financial and social support- approved for [employer], denied medicaid  7. Anxiety, depression - stable 8. Goals of care discussion  9. Rash face, chest, scalp, back- not taking doxycycline 10. Unwitnessed ?near-syncopal episode 09/06/17 with warmth and dizziness 11. Periorbital edemaand facial erythema, possibly related to vectibix, resolved  12. Multiple enlarged LN in cervical and previously in inguinal area  13. Acute respiratory infection, s/p Zpak  14. Hand-foot syndrome, secondary to Xeloda   Ms. Olafson appears stable. She completed cycle 7 Xeloda 1500 mg BID 1 week on and 1 week off, and panitumumab. she continues to tolerate treatment well overall, with fatigue and skin rash. She developed mild hand-foot syndrome, secondary to Xeloda. I recommend topical hydrocortisone PRN to hands and feet. She has persistent cough, clear rhinorrhea, and congestion. She was treated for URI, symptoms persist. I recommend symptom management with allergy pill, flonase, and tessalon for cough.   Labs reviewed, she has mild hypomagnesemia secondary to panitumumab, I recommend she begin oral mag replacement 1 tab per day, I sent in prescription. CBC and CMP are stable. BG 210, her level fluctuates, on DM medication. I recommend she monitor closely, watch her diet, and continue medication. Iron studies are adequate, she continues 1 tab daily. CEA continues to decrease, now 8.08.  Labs adequate to proceed with cycle 8 panitumumab and Xeloda today. Continue q2 weeks. She will return in 2 weeks to f/u before next cycle.   PLAN: -Labs reviewed, proceed with cycle 8 panitumumab and Xeloda at current dose -Symptom management with zyrtec, flonase, and tessalon for cough and rhinorrhea -Begin oral magensium  -Apply topical hydrocortisone to hands an feet PRN for redness and cracks  -return in 2 weeks for f/u and next cycle   All questions were answered. The patient knows to call the clinic with any problems, questions or concerns. No barriers to learning was detected.     Alla Feeling, NP 01/06/18

## 2018-01-06 ENCOUNTER — Inpatient Hospital Stay (HOSPITAL_BASED_OUTPATIENT_CLINIC_OR_DEPARTMENT_OTHER): Payer: Self-pay | Admitting: Nurse Practitioner

## 2018-01-06 ENCOUNTER — Inpatient Hospital Stay: Payer: Self-pay

## 2018-01-06 ENCOUNTER — Encounter: Payer: Self-pay | Admitting: Nurse Practitioner

## 2018-01-06 VITALS — BP 141/92 | HR 78 | Temp 98.5°F | Resp 19 | Ht 65.0 in | Wt 156.5 lb

## 2018-01-06 DIAGNOSIS — C187 Malignant neoplasm of sigmoid colon: Secondary | ICD-10-CM

## 2018-01-06 DIAGNOSIS — I10 Essential (primary) hypertension: Secondary | ICD-10-CM

## 2018-01-06 DIAGNOSIS — D500 Iron deficiency anemia secondary to blood loss (chronic): Secondary | ICD-10-CM

## 2018-01-06 DIAGNOSIS — E119 Type 2 diabetes mellitus without complications: Secondary | ICD-10-CM

## 2018-01-06 DIAGNOSIS — C19 Malignant neoplasm of rectosigmoid junction: Secondary | ICD-10-CM

## 2018-01-06 DIAGNOSIS — J3489 Other specified disorders of nose and nasal sinuses: Secondary | ICD-10-CM

## 2018-01-06 DIAGNOSIS — R05 Cough: Secondary | ICD-10-CM

## 2018-01-06 DIAGNOSIS — Z79899 Other long term (current) drug therapy: Secondary | ICD-10-CM

## 2018-01-06 DIAGNOSIS — C787 Secondary malignant neoplasm of liver and intrahepatic bile duct: Secondary | ICD-10-CM

## 2018-01-06 DIAGNOSIS — L271 Localized skin eruption due to drugs and medicaments taken internally: Secondary | ICD-10-CM

## 2018-01-06 DIAGNOSIS — R21 Rash and other nonspecific skin eruption: Secondary | ICD-10-CM

## 2018-01-06 LAB — CBC WITH DIFFERENTIAL (CANCER CENTER ONLY)
Abs Immature Granulocytes: 0.01 10*3/uL (ref 0.00–0.07)
Basophils Absolute: 0.1 10*3/uL (ref 0.0–0.1)
Basophils Relative: 1 %
Eosinophils Absolute: 0.4 10*3/uL (ref 0.0–0.5)
Eosinophils Relative: 5 %
HCT: 43.2 % (ref 36.0–46.0)
Hemoglobin: 13.9 g/dL (ref 12.0–15.0)
Immature Granulocytes: 0 %
Lymphocytes Relative: 30 %
Lymphs Abs: 2.4 10*3/uL (ref 0.7–4.0)
MCH: 27.9 pg (ref 26.0–34.0)
MCHC: 32.2 g/dL (ref 30.0–36.0)
MCV: 86.7 fL (ref 80.0–100.0)
Monocytes Absolute: 0.4 10*3/uL (ref 0.1–1.0)
Monocytes Relative: 5 %
Neutro Abs: 4.8 10*3/uL (ref 1.7–7.7)
Neutrophils Relative %: 59 %
Platelet Count: 189 10*3/uL (ref 150–400)
RBC: 4.98 MIL/uL (ref 3.87–5.11)
RDW: 21 % — ABNORMAL HIGH (ref 11.5–15.5)
WBC Count: 8 10*3/uL (ref 4.0–10.5)
nRBC: 0 % (ref 0.0–0.2)

## 2018-01-06 LAB — CEA (IN HOUSE-CHCC): CEA (CHCC-In House): 8.08 ng/mL — ABNORMAL HIGH (ref 0.00–5.00)

## 2018-01-06 LAB — CMP (CANCER CENTER ONLY)
ALT: 15 U/L (ref 0–44)
AST: 19 U/L (ref 15–41)
Albumin: 3.4 g/dL — ABNORMAL LOW (ref 3.5–5.0)
Alkaline Phosphatase: 166 U/L — ABNORMAL HIGH (ref 38–126)
Anion gap: 11 (ref 5–15)
BUN: 8 mg/dL (ref 6–20)
CO2: 24 mmol/L (ref 22–32)
Calcium: 8.2 mg/dL — ABNORMAL LOW (ref 8.9–10.3)
Chloride: 106 mmol/L (ref 98–111)
Creatinine: 0.73 mg/dL (ref 0.44–1.00)
GFR, Est AFR Am: >60 mL/min (ref >60)
GFR, Estimated: >60 mL/min (ref >60)
Glucose, Bld: 210 mg/dL — ABNORMAL HIGH (ref 70–99)
Potassium: 3.9 mmol/L (ref 3.5–5.1)
Sodium: 141 mmol/L (ref 135–145)
Total Bilirubin: 0.6 mg/dL (ref 0.3–1.2)
Total Protein: 7.5 g/dL (ref 6.5–8.1)

## 2018-01-06 LAB — IRON AND TIBC
Iron: 322 ug/dL — ABNORMAL HIGH (ref 41–142)
Saturation Ratios: 75 % — ABNORMAL HIGH (ref 21–57)
TIBC: 428 ug/dL (ref 236–444)
UIBC: 106 ug/dL

## 2018-01-06 LAB — FERRITIN: Ferritin: 54 ng/mL (ref 11–307)

## 2018-01-06 LAB — MAGNESIUM: Magnesium: 1.6 mg/dL — ABNORMAL LOW (ref 1.7–2.4)

## 2018-01-06 MED ORDER — SODIUM CHLORIDE 0.9 % IV SOLN
6.0000 mg/kg | Freq: Once | INTRAVENOUS | Status: AC
  Administered 2018-01-06: 400 mg via INTRAVENOUS
  Filled 2018-01-06: qty 20

## 2018-01-06 MED ORDER — MAGNESIUM OXIDE 400 (241.3 MG) MG PO TABS: 400.0000 mg | ORAL_TABLET | Freq: Every day | ORAL | 1 refills | Status: DC

## 2018-01-06 MED ORDER — CETIRIZINE HCL 10 MG PO TABS: 10.0000 mg | ORAL_TABLET | Freq: Every day | ORAL | 1 refills | Status: DC

## 2018-01-06 MED ORDER — SODIUM CHLORIDE 0.9 % IV SOLN
Freq: Once | INTRAVENOUS | Status: AC
  Administered 2018-01-06: 14:00:00 via INTRAVENOUS
  Filled 2018-01-06: qty 250

## 2018-01-06 MED FILL — VIT D2 1.25 MG (50,000 UNIT: 1.25 MG | 42 days supply | Qty: 6 | Fill #0

## 2018-01-06 NOTE — Patient Instructions (Signed)
[facility] Discharge Instructions for Patients Receiving Chemotherapy  Today you received the following chemotherapy agents: Vectibix.  To help prevent nausea and vomiting after your treatment, we encourage you to take your nausea medication as directed.   If you develop nausea and vomiting that is not controlled by your nausea medication, call the clinic.   BELOW ARE SYMPTOMS THAT SHOULD BE REPORTED IMMEDIATELY:  *FEVER GREATER THAN 100.5 F  *CHILLS WITH OR WITHOUT FEVER  NAUSEA AND VOMITING THAT IS NOT CONTROLLED WITH YOUR NAUSEA MEDICATION  *UNUSUAL SHORTNESS OF BREATH  *UNUSUAL BRUISING OR BLEEDING  TENDERNESS IN MOUTH AND THROAT WITH OR WITHOUT PRESENCE OF ULCERS  *URINARY PROBLEMS  *BOWEL PROBLEMS  UNUSUAL RASH Items with * indicate a potential emergency and should be followed up as soon as possible.  Feel free to call the clinic should you have any questions or concerns. The clinic phone number is [phone].  Please show the CHEMO ALERT CARD at check-in to the Emergency Department and triage nurse.   

## 2018-01-20 ENCOUNTER — Telehealth: Payer: Self-pay

## 2018-01-20 ENCOUNTER — Inpatient Hospital Stay: Payer: Self-pay

## 2018-01-20 ENCOUNTER — Inpatient Hospital Stay: Payer: Self-pay | Admitting: Hematology

## 2018-01-20 NOTE — Telephone Encounter (Signed)
Patient called stating can't come today, needs to reschedule to Friday if possible, scheduling message was sent.

## 2018-01-21 ENCOUNTER — Telehealth: Payer: Self-pay | Admitting: Hematology

## 2018-01-21 NOTE — Telephone Encounter (Signed)
R/s appt per 10/30 sch message - pt aware of appt .

## 2018-01-22 ENCOUNTER — Encounter: Payer: Self-pay | Admitting: Nurse Practitioner

## 2018-01-22 ENCOUNTER — Inpatient Hospital Stay: Payer: Self-pay | Attending: Hematology

## 2018-01-22 ENCOUNTER — Other Ambulatory Visit: Payer: Self-pay | Admitting: Oncology

## 2018-01-22 ENCOUNTER — Telehealth: Payer: Self-pay | Admitting: Hematology

## 2018-01-22 ENCOUNTER — Inpatient Hospital Stay (HOSPITAL_BASED_OUTPATIENT_CLINIC_OR_DEPARTMENT_OTHER): Payer: Self-pay | Admitting: Nurse Practitioner

## 2018-01-22 ENCOUNTER — Inpatient Hospital Stay: Payer: Self-pay

## 2018-01-22 VITALS — BP 137/93 | HR 87 | Temp 98.8°F | Resp 18 | Ht 65.0 in | Wt 160.1 lb

## 2018-01-22 DIAGNOSIS — I10 Essential (primary) hypertension: Secondary | ICD-10-CM

## 2018-01-22 DIAGNOSIS — C187 Malignant neoplasm of sigmoid colon: Secondary | ICD-10-CM

## 2018-01-22 DIAGNOSIS — Z79899 Other long term (current) drug therapy: Secondary | ICD-10-CM

## 2018-01-22 DIAGNOSIS — C787 Secondary malignant neoplasm of liver and intrahepatic bile duct: Secondary | ICD-10-CM | POA: Insufficient documentation

## 2018-01-22 DIAGNOSIS — F419 Anxiety disorder, unspecified: Secondary | ICD-10-CM | POA: Insufficient documentation

## 2018-01-22 DIAGNOSIS — T451X5D Adverse effect of antineoplastic and immunosuppressive drugs, subsequent encounter: Secondary | ICD-10-CM | POA: Insufficient documentation

## 2018-01-22 DIAGNOSIS — C19 Malignant neoplasm of rectosigmoid junction: Secondary | ICD-10-CM

## 2018-01-22 DIAGNOSIS — E119 Type 2 diabetes mellitus without complications: Secondary | ICD-10-CM | POA: Insufficient documentation

## 2018-01-22 DIAGNOSIS — L271 Localized skin eruption due to drugs and medicaments taken internally: Secondary | ICD-10-CM | POA: Insufficient documentation

## 2018-01-22 DIAGNOSIS — D500 Iron deficiency anemia secondary to blood loss (chronic): Secondary | ICD-10-CM

## 2018-01-22 DIAGNOSIS — Z794 Long term (current) use of insulin: Secondary | ICD-10-CM

## 2018-01-22 LAB — CBC WITH DIFFERENTIAL (CANCER CENTER ONLY)
Abs Immature Granulocytes: 0.02 10*3/uL (ref 0.00–0.07)
Basophils Absolute: 0.1 10*3/uL (ref 0.0–0.1)
Basophils Relative: 1 %
Eosinophils Absolute: 0.4 10*3/uL (ref 0.0–0.5)
Eosinophils Relative: 5 %
HCT: 40.8 % (ref 36.0–46.0)
Hemoglobin: 13.3 g/dL (ref 12.0–15.0)
Immature Granulocytes: 0 %
Lymphocytes Relative: 28 %
Lymphs Abs: 2.7 10*3/uL (ref 0.7–4.0)
MCH: 28.7 pg (ref 26.0–34.0)
MCHC: 32.6 g/dL (ref 30.0–36.0)
MCV: 87.9 fL (ref 80.0–100.0)
Monocytes Absolute: 0.7 10*3/uL (ref 0.1–1.0)
Monocytes Relative: 7 %
Neutro Abs: 5.6 10*3/uL (ref 1.7–7.7)
Neutrophils Relative %: 59 %
Platelet Count: 195 10*3/uL (ref 150–400)
RBC: 4.64 MIL/uL (ref 3.87–5.11)
RDW: 20.4 % — ABNORMAL HIGH (ref 11.5–15.5)
WBC Count: 9.5 10*3/uL (ref 4.0–10.5)
nRBC: 0 % (ref 0.0–0.2)

## 2018-01-22 LAB — CMP (CANCER CENTER ONLY)
ALT: 16 U/L (ref 0–44)
AST: 20 U/L (ref 15–41)
Albumin: 3.1 g/dL — ABNORMAL LOW (ref 3.5–5.0)
Alkaline Phosphatase: 163 U/L — ABNORMAL HIGH (ref 38–126)
Anion gap: 11 (ref 5–15)
BUN: 6 mg/dL (ref 6–20)
CO2: 24 mmol/L (ref 22–32)
Calcium: 7.7 mg/dL — ABNORMAL LOW (ref 8.9–10.3)
Chloride: 108 mmol/L (ref 98–111)
Creatinine: 0.68 mg/dL (ref 0.44–1.00)
GFR, Est AFR Am: >60 mL/min (ref >60)
GFR, Estimated: >60 mL/min (ref >60)
Glucose, Bld: 104 mg/dL — ABNORMAL HIGH (ref 70–99)
Potassium: 3.5 mmol/L (ref 3.5–5.1)
Sodium: 143 mmol/L (ref 135–145)
Total Bilirubin: 0.6 mg/dL (ref 0.3–1.2)
Total Protein: 6.9 g/dL (ref 6.5–8.1)

## 2018-01-22 LAB — MAGNESIUM: Magnesium: 1.4 mg/dL — CL (ref 1.7–2.4)

## 2018-01-22 MED ORDER — MAGNESIUM OXIDE 400 (241.3 MG) MG PO TABS: 400.0000 mg | ORAL_TABLET | Freq: Two times a day (BID) | ORAL | 1 refills | Status: DC

## 2018-01-22 MED ORDER — SODIUM CHLORIDE 0.9 % IV SOLN
6.0000 mg/kg | Freq: Once | INTRAVENOUS | Status: AC
  Administered 2018-01-22: 400 mg via INTRAVENOUS
  Filled 2018-01-22: qty 20

## 2018-01-22 MED ORDER — DIPHENHYDRAMINE HCL 25 MG PO CAPS
ORAL_CAPSULE | ORAL | Status: AC
  Filled 2018-01-22: qty 1

## 2018-01-22 MED ORDER — DIPHENHYDRAMINE HCL 25 MG PO CAPS
25.0000 mg | ORAL_CAPSULE | Freq: Once | ORAL | Status: AC
  Administered 2018-01-22: 25 mg via ORAL

## 2018-01-22 MED ORDER — SODIUM CHLORIDE 0.9 % IV SOLN
Freq: Once | INTRAVENOUS | Status: AC
  Administered 2018-01-22: 17:00:00 via INTRAVENOUS
  Filled 2018-01-22: qty 250

## 2018-01-22 MED ORDER — UREA 10 % EX CREA: TOPICAL_CREAM | CUTANEOUS | 0 refills | Status: DC | PRN

## 2018-01-22 MED FILL — UREA 10 % CREAM: 10 | 30 days supply | Qty: 85 | Fill #0

## 2018-01-22 NOTE — Patient Instructions (Signed)
[facility] Discharge Instructions for Patients Receiving Chemotherapy  Today you received the following chemotherapy agents: Vectibix.  To help prevent nausea and vomiting after your treatment, we encourage you to take your nausea medication as directed.   If you develop nausea and vomiting that is not controlled by your nausea medication, call the clinic.   BELOW ARE SYMPTOMS THAT SHOULD BE REPORTED IMMEDIATELY:  *FEVER GREATER THAN 100.5 F  *CHILLS WITH OR WITHOUT FEVER  NAUSEA AND VOMITING THAT IS NOT CONTROLLED WITH YOUR NAUSEA MEDICATION  *UNUSUAL SHORTNESS OF BREATH  *UNUSUAL BRUISING OR BLEEDING  TENDERNESS IN MOUTH AND THROAT WITH OR WITHOUT PRESENCE OF ULCERS  *URINARY PROBLEMS  *BOWEL PROBLEMS  UNUSUAL RASH Items with * indicate a potential emergency and should be followed up as soon as possible.  Feel free to call the clinic should you have any questions or concerns. The clinic phone number is [phone].  Please show the CHEMO ALERT CARD at check-in to the Emergency Department and triage nurse.   

## 2018-01-22 NOTE — Addendum Note (Signed)
Addended by: Alla Feeling on: 01/22/2018 04:52 PM   Modules accepted: Orders

## 2018-01-22 NOTE — Progress Notes (Unsigned)
Okay to treat. Pt will start home Mag.

## 2018-01-22 NOTE — Progress Notes (Addendum)
[facility]  [phone] [phone]  Clinic Follow up Note   Patient Care Team: Danford, Katy D, NP as PCP - General (Family Medicine) Kerr, Jeffrey, MD as Consulting Physician (Endocrinology) Feng, Yan, MD as Consulting Physician (Hematology) Burton, Lacie K, NP as Nurse Practitioner (Nurse Practitioner) Magod, Marc, MD as Consulting Physician (Gastroenterology) 01/22/2018  SUMMARY OF ONCOLOGIC HISTORY: Oncology History   Cancer Staging Malignant neoplasm of rectosigmoid junction (HCC) Staging form: Colon and Rectum, AJCC 8th Edition - Clinical stage from 06/11/2017: Stage IVA (cTX, cN1b, pM1a) - Signed by Feng, Yan, MD on 06/15/2017       Malignant neoplasm of rectosigmoid junction (HCC)   05/30/2017 Imaging    CT AP IMPRESSION: 1. 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 Abdominopelvic adenopathy. Rectosigmoid mass with suggestion of partial obstruction as evidenced by large colonic stool burden more proximally. 2.  Aortic Atherosclerosis (ICD10-I70.0).  This is age advanced.    05/31/2017 Tumor Marker    CEA 353.3 (elevated) AFP 4.5 (normal)    05/31/2017 Initial Biopsy    Diagnosis Colon, biopsy, sigmoid - INVASIVE ADENOCARCINOMA - SEE COMMENT    05/31/2017 Imaging    CT CHEST IMPRESSION: 1. No evidence of metastatic disease in the chest. 2. No acute findings.  Aortic Atherosclerosis (ICD10-I70.0).     05/31/2017 Procedure    Colonoscopy per Dr. Magod Findings: An infiltrative and ulcerated partially obstructing medium-sized mass was found in the recto-sigmoid colon. The mass was circumferential. The mass measured four cm in length. No bleeding was present.   Impression - One small polyp in the rectum. - The examination was otherwise normal. - Malignant partially obstructing tumor in the recto-sigmoid colon. Biopsied. - One medium polyp in the proximal sigmoid  colon. - The examination was otherwise normal. - Internal hemorrhoids.    06/03/2017 Initial Diagnosis    Malignant neoplasm of transverse colon (HCC)    06/11/2017 Cancer Staging    Staging form: Colon and Rectum, AJCC 8th Edition - Clinical stage from 06/11/2017: Stage IVA (cTX, cN1b, pM1a) - Signed by Feng, Yan, MD on 06/15/2017    06/11/2017 Pathology Results    Liver biopsy confirmed metastatic colon cancer    07/23/2017 Imaging    CT CAP IMPRESSION: 1. Mild progression of hepatic metastasis. 2. Similar rectosigmoid primary with abdominopelvic nodal metastasis. 3.  No acute process or evidence of metastatic disease in the chest. 4. Aortic Atherosclerosis (ICD10-I70.0). Coronary artery atherosclerosis. This is age advanced. 5. Proximal colonic constipation, again suggesting a component of partial obstruction at the primary site. 6. Mild ascending aortic dilatation at 4.1 cm.    08/25/2017 -  Chemotherapy    -Xeloda 1500mg BID 1 week on and 1 week off starting 08/25/17 -Vectibix every 2 weeks starting 08/25/17    12/14/2017 Imaging    IMPRESSION: 1. Response to therapy. Marked decrease in hepatic metastatic burden. More mild decrease in abdominopelvic adenopathy and definition of rectosigmoid primary. 2.  No acute process or evidence of metastatic disease in the chest. 3.  Aortic Atherosclerosis (ICD10-I70.0).    CURRENT THERAPY:  -Xeloda 1500mg BID 1 week on and 1 week off starting 08/25/17. Increased to 2000mg in the AM and 1500mg in the PM starting 09/08/17,dose was reduced to 1500 mg every 12 hours, 1 week on and one week of subsequently due to skin rash -Vectibix every 2 weeks starting 08/25/17  INTERVAL HISTORY: Beverly Schwartz returns for follow up as scheduled. She continues 1500 mg   BID 1 week on/1 week off and vectibix q2 week. She received last infusion on 01/06/18. She is fatigued, but able to function normally. She has tender cracks on her feet and dry hands. She has not tried  hydrocortisone yet. Denies neuropathy. Her rash and scalp itching is stable. Denies mucositis. Denies diarrhea. She is mildly constipated after vectibix infusions. Her dry cough increases after vectibix as well. Denies fever, chills, chest pain, or dyspnea. Denies abdominal pain.    MEDICAL HISTORY:  Past Medical History:  Diagnosis Date  . Anxiety   . Cancer ([place])    colon, liver  . Chest pain   . Colon cancer ([place])   . Complication of anesthesia   . Depression   . Diabetes mellitus   . Dizziness   . Family history of breast cancer   . Family history of stomach cancer   . Hyperlipidemia   . Hypertension   . Hypothyroidism   . Obesity   . PONV (postoperative nausea and vomiting)   . Tachycardia     SURGICAL HISTORY: Past Surgical History:  Procedure Laterality Date  . FLEXIBLE SIGMOIDOSCOPY N/A 05/31/2017   Procedure: FLEXIBLE SIGMOIDOSCOPY;  Surgeon: Clarene Essex, MD;  Location: [facility];  Service: Endoscopy;  Laterality: N/A;  . WISDOM TOOTH EXTRACTION     age 27's    I have reviewed the social history and family history with the patient and they are unchanged from previous note.  ALLERGIES:  is allergic to bupropion and amoxicillin.  MEDICATIONS:  Current Outpatient Medications  Medication Sig Dispense Refill  . acetaminophen-codeine (TYLENOL #3) 300-30 MG tablet Take 1 tablet by mouth every 6 (six) hours as needed for moderate pain. 30 tablet 0  . ALPRAZolam (XANAX) 1 MG tablet Take 1 tablet (1 mg total) by mouth at bedtime as needed for anxiety. 30 tablet 0  . benzonatate (TESSALON) 100 MG capsule Take 1 capsule (100 mg total) by mouth 3 (three) times daily as needed for cough. 20 capsule 0  . capecitabine (XELODA) 500 MG tablet Take 4 tablets (2047m) in AM & and 3 tabs (15081m in PM, within 3047mof finishing food. Take on days 1-7 & 15-21 of each 28 day cycle 294 tablet 0  . cetirizine (ZYRTEC ALLERGY) 10 MG tablet Take 1 tablet (10 mg total) by mouth daily. 30  tablet 1  . CINNAMON PO Take 1 tablet by mouth daily.     . clindamycin (CLINDAGEL) 1 % gel Apply topically 2 (two) times daily. 60 g 1  . doxycycline (VIBRA-TABS) 100 MG tablet Take 1 tablet (100 mg total) by mouth 2 (two) times daily. 60 tablet 2  . ergocalciferol (VITAMIN D2) 50000 units capsule Take 1 capsule (50,000 Units total) by mouth once a week. 6 capsule 0  . fluticasone (FLONASE) 50 MCG/ACT nasal spray Place 1 spray into both nostrils daily. (Patient taking differently: Place 1 spray into both nostrils daily as needed for allergies. ) 16 g 2  . insulin NPH-regular Human (NOVOLIN 70/30) (70-30) 100 UNIT/ML injection Inject 25 Units into the skin 2 (two) times daily with a meal.     . levothyroxine (SYNTHROID, LEVOTHROID) 112 MCG tablet Take 112 mcg by mouth daily before breakfast.    . Omega-3 Fatty Acids (FISH OIL) 1000 MG CAPS Take 1,000 mg by mouth daily.     . polyethylene glycol (MIRALAX / GLYCOLAX) packet Take 17 g by mouth daily. 14 each 0  . propranolol (INDERAL) 10 MG tablet TAKE 1  TABLET BY MOUTH EVERY DAY 90 tablet 0  . TURMERIC PO Take 1 capsule by mouth daily.     . atorvastatin (LIPITOR) 10 MG tablet Take 1 tablet (10 mg total) by mouth daily. 90 tablet 3  . azithromycin (ZITHROMAX Z-PAK) 250 MG tablet Take as prescribed 6 each 0  . magnesium oxide (MAG-OX) 400 (241.3 Mg) MG tablet Take 1 tablet (400 mg total) by mouth 2 (two) times daily. 60 tablet 1  . methylPREDNISolone (MEDROL DOSEPAK) 4 MG TBPK tablet Take as directed 21 tablet 0  . urea (CARMOL) 10 % cream Apply topically as needed. 71 g 0   No current facility-administered medications for this visit.    Facility-Administered Medications Ordered in Other Visits  Medication Dose Route Frequency Provider Last Rate Last Dose  . 0.9 %  sodium chloride infusion   Intravenous Once Sherrill, Gary B, MD      . panitumumab (VECTIBIX) 400 mg in sodium chloride 0.9 % 100 mL chemo infusion  6 mg/kg (Treatment Plan Recorded)  Intravenous Once Sherrill, Gary B, MD 120 mL/hr at 01/22/18 1628 400 mg at 01/22/18 1628    PHYSICAL EXAMINATION: ECOG PERFORMANCE STATUS: 1 - Symptomatic but completely ambulatory  Vitals:   01/22/18 1402  BP: (!) 137/93  Pulse: 87  Resp: 18  Temp: 98.8 F (37.1 C)  SpO2: 100%   Filed Weights   01/22/18 1402  Weight: 160 lb 1.6 oz (72.6 kg)    GENERAL:alert, no distress and comfortable SKIN: generalized dry flaky skin with mild acne type rash to face, chest, and scalp. Small cracks to heals bilaterally with erythema to soles.  EYES:  sclera clear OROPHARYNX:no thrush or ulcers  LYMPH:  no palpable cervical or supraclavicular lymphadenopathy LUNGS: clear to auscultation with normal breathing effort HEART: regular rate & rhythm, no lower extremity edema ABDOMEN:abdomen soft, non-tender and normal bowel sounds. No hepatomegaly  Musculoskeletal:no cyanosis of digits and no clubbing  NEURO: alert & oriented x 3 with fluent speech, no focal motor/sensory deficits  LABORATORY DATA:  I have reviewed the data as listed CBC Latest Ref Rng & Units 01/22/2018 01/06/2018 12/23/2017  WBC 4.0 - 10.5 K/uL 9.5 8.0 9.0  Hemoglobin 12.0 - 15.0 g/dL 13.3 13.9 13.1  Hematocrit 36.0 - 46.0 % 40.8 43.2 40.8  Platelets 150 - 400 K/uL 195 189 225     CMP Latest Ref Rng & Units 01/22/2018 01/06/2018 12/23/2017  Glucose 70 - 99 mg/dL 104(H) 210(H) 104(H)  BUN 6 - 20 mg/dL 6 8 7  Creatinine 0.44 - 1.00 mg/dL 0.68 0.73 0.65  Sodium 135 - 145 mmol/L 143 141 142  Potassium 3.5 - 5.1 mmol/L 3.5 3.9 3.5  Chloride 98 - 111 mmol/L 108 106 108  CO2 22 - 32 mmol/L 24 24 24  Calcium 8.9 - 10.3 mg/dL 7.7(L) 8.2(L) 9.0  Total Protein 6.5 - 8.1 g/dL 6.9 7.5 7.1  Total Bilirubin 0.3 - 1.2 mg/dL 0.6 0.6 0.4  Alkaline Phos 38 - 126 U/L 163(H) 166(H) 163(H)  AST 15 - 41 U/L 20 19 19  ALT 0 - 44 U/L 16 15 16   CEA: 05/31/2017:353.3 07/07/2017: 494.58 08/10/2017: 754.17 08/25/2017: 862.87 (began  treatment) 09/22/2017: 61.0 10/06/2017: 28.92 11/05/2017: 66.63 12/02/2017: 11.33 01/06/18: 8.08  PATHOLOGY REPORT:  Diagnosis 06/11/2017 Liver, needle/core biopsy - METASTATIC ADENOCARCINOMA, CONSISTENT WITH COLONIC PRIMARY.   RADIOGRAPHIC STUDIES: I have personally reviewed the radiological images as listed and agreed with the findings in the report. No results found.     ASSESSMENT & PLAN: 47y.o.caucasian female, with past medical history of diabetes, hypertension, presented with left lower quadrant abdominal pain, hematochezia, fatigue and weight loss  1. Sigmoid adenocarcinoma, with abdominopelvic adenopathy andhepatic metastasis, stage IV, MSI-stable 2. Anemia, iron deficiency  3. DM, HTN, HL 4. Weight loss 5. Genetics  6. Financial and social support- approved for [facility] grant,deniedmedicaid  7. Anxiety, depression- stable 8. Goals of care discussion  9. Rash face, chest, scalp, back- not taking doxycycline 10. Unwitnessed ?near-syncopal episode 09/06/17 with warmth and dizziness 11. Periorbital edemaand facial erythema, possibly related to vectibix, resolved 12. Multiple enlarged LN in cervical and previously in inguinal area  13. Acute respiratory infection, s/p Zpak  14. Hand-foot syndrome, secondary to Xeloda  15. Hypomagnesemia, secondary to panitumumab  Beverly Schwartz appears stable. She completed cycle 8 panitumumab and Xeloda 1500 mg BID 1 week. She has fatigue and increased hand-foot syndrome secondary to Xeloda, but otherwise continues to tolerate treatment well. I previously recommended hydrocortisone cream for her hands and feet, which she has not tried yet. She agrees to try. I will prescribe urea cream also in the event steroid does not help. If symptoms worsen or fail to improve we may need to decrease xeloda dosing.   Labs reviewed, CBC and CMP are stable. Mg 1.4, down from 1.6. I previously prescribed mag-ox once daily, she has not started. She is  reluctant to try new medication in general. I reviewed indication. She agrees to try. I recommend 1 tab BID. I sent in new prescription. Calcium is also slightly low, she does not take calcium with vitamin D regularly. I encouraged her to increase calcium in her diet.   Proceed with cycle 9 panitumumab today and continue Xeloda 1500 mg BID 1 week on and 1 week off. She will return in 2 weeks for follow up and next cycle.   PLAN: -Labs reviewed, proceed with panitumumab today, continue q2 weeks -Begin next cycle Xeloda 1500 mg BID 1 week on and 1 week off -Hydrocortisone and urea to hands/feet PRN -Begin mag-ox 1 tab BID and increase calcium, either supplement or in diet  -F/u in 2 weeks with next cycle   All questions were answered. The patient knows to call the clinic with any problems, questions or concerns. No barriers to learning was detected. I spent 20 minutes counseling the patient face to face. The total time spent in the appointment was 25 minutes and more than 50% was on counseling and review of test results     Lacie K Burton, NP 01/22/18   Addendum: I visited the patient in infusion room after today's visit to clarify questions about her treatment plan. She noted itching/watery eyes and asked for benadryl. OK to give benadryl 25 mg po x1 in infusion today.   

## 2018-01-22 NOTE — Telephone Encounter (Signed)
Appts scheduled avs/calendar printed and given to patient per 11/1 los

## 2018-01-25 ENCOUNTER — Other Ambulatory Visit: Payer: Self-pay | Admitting: Adult Health

## 2018-01-25 MED FILL — CETIRIZINE HCL 10 MG TABLET: 10 | 30 days supply | Qty: 30 | Fill #0

## 2018-02-02 ENCOUNTER — Other Ambulatory Visit: Payer: Self-pay | Admitting: Hematology

## 2018-02-04 ENCOUNTER — Inpatient Hospital Stay (HOSPITAL_BASED_OUTPATIENT_CLINIC_OR_DEPARTMENT_OTHER): Payer: Self-pay | Admitting: Hematology

## 2018-02-04 ENCOUNTER — Encounter: Payer: Self-pay | Admitting: General Practice

## 2018-02-04 ENCOUNTER — Inpatient Hospital Stay: Payer: Self-pay

## 2018-02-04 ENCOUNTER — Encounter: Payer: Self-pay | Admitting: Hematology

## 2018-02-04 VITALS — BP 131/96 | HR 88 | Temp 98.6°F | Resp 18 | Ht 65.0 in | Wt 162.0 lb

## 2018-02-04 VITALS — BP 152/93 | HR 85

## 2018-02-04 DIAGNOSIS — I10 Essential (primary) hypertension: Secondary | ICD-10-CM

## 2018-02-04 DIAGNOSIS — C187 Malignant neoplasm of sigmoid colon: Secondary | ICD-10-CM

## 2018-02-04 DIAGNOSIS — Z79899 Other long term (current) drug therapy: Secondary | ICD-10-CM

## 2018-02-04 DIAGNOSIS — D500 Iron deficiency anemia secondary to blood loss (chronic): Secondary | ICD-10-CM

## 2018-02-04 DIAGNOSIS — L271 Localized skin eruption due to drugs and medicaments taken internally: Secondary | ICD-10-CM

## 2018-02-04 DIAGNOSIS — C19 Malignant neoplasm of rectosigmoid junction: Secondary | ICD-10-CM

## 2018-02-04 DIAGNOSIS — F419 Anxiety disorder, unspecified: Secondary | ICD-10-CM

## 2018-02-04 DIAGNOSIS — Z794 Long term (current) use of insulin: Secondary | ICD-10-CM

## 2018-02-04 DIAGNOSIS — C787 Secondary malignant neoplasm of liver and intrahepatic bile duct: Secondary | ICD-10-CM

## 2018-02-04 DIAGNOSIS — E559 Vitamin D deficiency, unspecified: Secondary | ICD-10-CM

## 2018-02-04 DIAGNOSIS — E119 Type 2 diabetes mellitus without complications: Secondary | ICD-10-CM

## 2018-02-04 LAB — FERRITIN: Ferritin: 14 ng/mL (ref 11–307)

## 2018-02-04 LAB — CMP (CANCER CENTER ONLY)
ALT: 15 U/L (ref 0–44)
AST: 21 U/L (ref 15–41)
Albumin: 3.3 g/dL — ABNORMAL LOW (ref 3.5–5.0)
Alkaline Phosphatase: 176 U/L — ABNORMAL HIGH (ref 38–126)
Anion gap: 13 (ref 5–15)
BUN: 8 mg/dL (ref 6–20)
CO2: 23 mmol/L (ref 22–32)
Calcium: 7.6 mg/dL — ABNORMAL LOW (ref 8.9–10.3)
Chloride: 107 mmol/L (ref 98–111)
Creatinine: 0.78 mg/dL (ref 0.44–1.00)
GFR, Est AFR Am: >60 mL/min (ref >60)
GFR, Estimated: >60 mL/min (ref >60)
Glucose, Bld: 226 mg/dL — ABNORMAL HIGH (ref 70–99)
Potassium: 3.7 mmol/L (ref 3.5–5.1)
Sodium: 143 mmol/L (ref 135–145)
Total Bilirubin: 0.5 mg/dL (ref 0.3–1.2)
Total Protein: 7.3 g/dL (ref 6.5–8.1)

## 2018-02-04 LAB — CBC WITH DIFFERENTIAL (CANCER CENTER ONLY)
Abs Immature Granulocytes: 0.01 10*3/uL (ref 0.00–0.07)
Basophils Absolute: 0.1 10*3/uL (ref 0.0–0.1)
Basophils Relative: 1 %
Eosinophils Absolute: 0.4 10*3/uL (ref 0.0–0.5)
Eosinophils Relative: 6 %
HCT: 43.9 % (ref 36.0–46.0)
Hemoglobin: 14.1 g/dL (ref 12.0–15.0)
Immature Granulocytes: 0 %
Lymphocytes Relative: 25 %
Lymphs Abs: 1.9 10*3/uL (ref 0.7–4.0)
MCH: 29.1 pg (ref 26.0–34.0)
MCHC: 32.1 g/dL (ref 30.0–36.0)
MCV: 90.7 fL (ref 80.0–100.0)
Monocytes Absolute: 0.4 10*3/uL (ref 0.1–1.0)
Monocytes Relative: 5 %
Neutro Abs: 4.7 10*3/uL (ref 1.7–7.7)
Neutrophils Relative %: 63 %
Platelet Count: 170 10*3/uL (ref 150–400)
RBC: 4.84 MIL/uL (ref 3.87–5.11)
RDW: 20.8 % — ABNORMAL HIGH (ref 11.5–15.5)
WBC Count: 7.5 10*3/uL (ref 4.0–10.5)
nRBC: 0 % (ref 0.0–0.2)

## 2018-02-04 LAB — IRON AND TIBC
Iron: 322 ug/dL — ABNORMAL HIGH (ref 41–142)
Saturation Ratios: 82 % — ABNORMAL HIGH (ref 21–57)
TIBC: 394 ug/dL (ref 236–444)
UIBC: 72 ug/dL — ABNORMAL LOW (ref 120–384)

## 2018-02-04 LAB — CEA (IN HOUSE-CHCC): CEA (CHCC-In House): 8.66 ng/mL — ABNORMAL HIGH (ref 0.00–5.00)

## 2018-02-04 LAB — MAGNESIUM: Magnesium: 1.3 mg/dL — CL (ref 1.7–2.4)

## 2018-02-04 MED ORDER — MAGNESIUM SULFATE 2 GM/50ML IV SOLN
2.0000 g | Freq: Once | INTRAVENOUS | Status: AC
  Administered 2018-02-04: 2 g via INTRAVENOUS
  Filled 2018-02-04: qty 50

## 2018-02-04 MED ORDER — SODIUM CHLORIDE 0.9 % IV SOLN: Freq: Once | INTRAVENOUS | Status: DC

## 2018-02-04 MED ORDER — SODIUM CHLORIDE 0.9 % IV SOLN
Freq: Once | INTRAVENOUS | Status: AC
  Administered 2018-02-04: 09:00:00 via INTRAVENOUS
  Filled 2018-02-04: qty 250

## 2018-02-04 MED ORDER — SODIUM CHLORIDE 0.9 % IV SOLN
6.0000 mg/kg | Freq: Once | INTRAVENOUS | Status: AC
  Administered 2018-02-04: 400 mg via INTRAVENOUS
  Filled 2018-02-04: qty 20

## 2018-02-04 NOTE — Progress Notes (Signed)
[facility] Note  Followed up with Beverly Schwartz in infusion, making space for her to share and process personal updates. She is celebrating her son's straight As in seventh grade, finding joy in seeing him achieve his personal best. She is also overjoyed at her encouraging labs and feeling gratitude that she braved chemo, which had originally scared her so; per pt, Dr Beverly Schwartz and Beverly Schwartz/NP's responsiveness in creating a treatment plan that honored her wishes has brought her a lot of peace, courage, and confidence in her team.  Beverly Schwartz utilizes [facility] for meaning-making. She has expressed interest in GI Support Group and other programming, requesting calendar. Brought event flyers, calendar, counseling brochure, and other information about [facility] resources, encouraging participation. Beverly Schwartz plans to f/u with chaplain when she knows next lab results.   [place], [place], [facility] Pager [phone] Voicemail [phone]

## 2018-02-04 NOTE — Progress Notes (Signed)
Labs reviewed by Dr. Burr Medico. Magnesium infusion ordered. No other orders received.

## 2018-02-04 NOTE — Progress Notes (Signed)
1250- Patient states she feels much better. No complaints voiced. Patient ambulatory without assistance.

## 2018-02-04 NOTE — Progress Notes (Signed)
1230- Magnesium infusion complete. Patient complain of "feeling hot inside and flushed". BP taken- 145/107, Sat 100%. Dr. Burr Medico made aware and over to infusion to assess patient.  1233- BP 164/90. Sat 100%, Pulse- 82. Dr. Burr Medico assessed patient and patient stated she is feeling a little better. Per Dr. Burr Medico continue to observe patient another 15 minutes and if vitals signs are stable it is ok to discharge patient.

## 2018-02-04 NOTE — Progress Notes (Signed)
[place]  [phone] [phone]  Clinic Follow up Note   Patient Care Team: Esaw Grandchild, NP as PCP - General (Family Medicine) Delrae Rend, MD as Consulting Physician (Endocrinology) Truitt Merle, MD as Consulting Physician (Hematology) Alla Feeling, NP as Nurse Practitioner (Nurse Practitioner) Clarene Essex, MD as Consulting Physician (Gastroenterology)   Date of Service:  02/04/2018   CHIEF COMPLAIN: f/u metastatic sigmoid colon cancer  SUMMARY OF ONCOLOGIC HISTORY: Oncology History   Cancer Staging Malignant neoplasm of rectosigmoid junction [facility]) Staging form: Colon and Rectum, AJCC 8th Edition - Clinical stage from 06/11/2017: Stage IVA (cTX, cN1b, pM1a) - Signed by Truitt Merle, MD on 06/15/2017       Malignant neoplasm of rectosigmoid junction ([place])   05/30/2017 Imaging    CT AP IMPRESSION: 1. 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 Abdominopelvic adenopathy. Rectosigmoid mass with suggestion of partial obstruction as evidenced by large colonic stool burden more proximally. 2.  Aortic Atherosclerosis (ICD10-I70.0).  This is age advanced.    05/31/2017 Tumor Marker    CEA 353.3 (elevated) AFP 4.5 (normal)    05/31/2017 Initial Biopsy    Diagnosis Colon, biopsy, sigmoid - INVASIVE ADENOCARCINOMA - SEE COMMENT    05/31/2017 Imaging    CT CHEST IMPRESSION: 1. No evidence of metastatic disease in the chest. 2. No acute findings.  Aortic Atherosclerosis (ICD10-I70.0).     05/31/2017 Procedure    Colonoscopy per Dr. Watt Climes Findings: An infiltrative and ulcerated partially obstructing medium-sized mass was found in the recto-sigmoid colon. The mass was circumferential. The mass measured four cm in length. No bleeding was present.   Impression - One small polyp in the rectum. - The examination was otherwise normal. - Malignant partially obstructing tumor in the  recto-sigmoid colon. Biopsied. - One medium polyp in the proximal sigmoid colon. - The examination was otherwise normal. - Internal hemorrhoids.    06/03/2017 Initial Diagnosis    Malignant neoplasm of transverse colon ([place])    06/11/2017 Cancer Staging    Staging form: Colon and Rectum, AJCC 8th Edition - Clinical stage from 06/11/2017: Stage IVA (cTX, cN1b, pM1a) - Signed by Truitt Merle, MD on 06/15/2017    06/11/2017 Pathology Results    Liver biopsy confirmed metastatic colon cancer    07/23/2017 Imaging    CT CAP IMPRESSION: 1. Mild progression of hepatic metastasis. 2. Similar rectosigmoid primary with abdominopelvic nodal metastasis. 3.  No acute process or evidence of metastatic disease in the chest. 4. Aortic Atherosclerosis (ICD10-I70.0). Coronary artery atherosclerosis. This is age advanced. 5. Proximal colonic constipation, again suggesting a component of partial obstruction at the primary site. 6. Mild ascending aortic dilatation at 4.1 cm.    08/25/2017 -  Chemotherapy    -Xeloda '1500mg'$  BID 1 week on and 1 week off starting 08/25/17 -Vectibix every 2 weeks starting 08/25/17    12/14/2017 Imaging    IMPRESSION: 1. Response to therapy. Marked decrease in hepatic metastatic burden. More mild decrease in abdominopelvic adenopathy and definition of rectosigmoid primary. 2.  No acute process or evidence of metastatic disease in the chest. 3.  Aortic Atherosclerosis (ICD10-I70.0).     CURRENT THERAPY:   -Xeloda '1500mg'$  BID 1 week on and 1 week off starting 08/25/17. Increased to '2000mg'$  in the AM and '1500mg'$  in the PM starting 09/08/17, dose was reduced to 1500 mg BID, 1 week on and one week of subsequently due to skin rash -Vectibix every 2 weeks starting  08/25/17  INTERVAL HISTORY:  Ms Carrol returns today for follow-up. She was last seen by me in 10/2017. She has been followed by NP Laice in interim. She presents to the clinic today by herself. She notes she has been experiencing  increased hand-foot syndrome recently. It is worse in her feet where she has bigger cracks in her skin and more pain. She is currently using urea cream and hydrocortisone. She starts her on week tonight and will reduce dose to '1500mg'$  in the AM and '1000mg'$  in the PM for this week.  She also notes diarrhea twice a day currently with loose stool. She notes one episode of small amount of blood in stool. She notes she has not had her menstrual cycle recently and wonders if she is able starting menopause, however she understands this can come from her chemo treatment.     REVIEW OF SYSTEMS:  Constitutional: Denies fevers, chills or abnormal weight loss  Eyes: Denies blurriness of vision Ears, nose, mouth, throat, and face: Denies mucositis or sore throat Respiratory: Denies cough, dyspnea or wheezes Cardiovascular: Denies palpitation, chest discomfort or lower extremity swelling Gastrointestinal: (+) mild intermittent hematochezia and mild diarrhea, controlled  Skin: (+) rash on face, scalp and chest, stable (+) hand-foot syndrome, worse in feet Lymphatics: Denies new lymphadenopathy or easy bruising (+) painful LN in cervical area, enlarging  Neurological:Denies numbness, tingling or new weaknesses Behavioral/Psych: Mood is stable, no new changes  All other systems were reviewed with the patient and are negative.  MEDICAL HISTORY:  Past Medical History:  Diagnosis Date  . Anxiety   . Cancer ([place])    colon, liver  . Chest pain   . Colon cancer ([place])   . Complication of anesthesia   . Depression   . Diabetes mellitus   . Dizziness   . Family history of breast cancer   . Family history of stomach cancer   . Hyperlipidemia   . Hypertension   . Hypothyroidism   . Obesity   . PONV (postoperative nausea and vomiting)   . Tachycardia     SURGICAL HISTORY: Past Surgical History:  Procedure Laterality Date  . FLEXIBLE SIGMOIDOSCOPY N/A 05/31/2017   Procedure: FLEXIBLE SIGMOIDOSCOPY;   Surgeon: Clarene Essex, MD;  Location: [facility] ENDOSCOPY;  Service: Endoscopy;  Laterality: N/A;  . WISDOM TOOTH EXTRACTION     age 67's    I have reviewed the social history and family history with the patient and they are unchanged from previous note.  ALLERGIES:  is allergic to bupropion and amoxicillin.  MEDICATIONS:  Current Outpatient Medications  Medication Sig Dispense Refill  . acetaminophen-codeine (TYLENOL #3) 300-30 MG tablet Take 1 tablet by mouth every 6 (six) hours as needed for moderate pain. 30 tablet 0  . ALPRAZolam (XANAX) 1 MG tablet Take 1 tablet (1 mg total) by mouth at bedtime as needed for anxiety. 30 tablet 0  . atorvastatin (LIPITOR) 10 MG tablet Take 1 tablet (10 mg total) by mouth daily. 90 tablet 3  . azithromycin (ZITHROMAX Z-PAK) 250 MG tablet Take as prescribed 6 each 0  . benzonatate (TESSALON) 100 MG capsule Take 1 capsule (100 mg total) by mouth 3 (three) times daily as needed for cough. 20 capsule 0  . capecitabine (XELODA) 500 MG tablet Take 4 tablets ('2000mg'$ ) in AM & and 3 tabs ('1500mg'$ ) in PM, within 39mn of finishing food. Take on days 1-7 & 15-21 of each 28 day cycle 294 tablet 0  . cetirizine (ZYRTEC ALLERGY)  10 MG tablet Take 1 tablet (10 mg total) by mouth daily. 30 tablet 1  . CINNAMON PO Take 1 tablet by mouth daily.     . clindamycin (CLINDAGEL) 1 % gel Apply topically 2 (two) times daily. 60 g 1  . doxycycline (VIBRA-TABS) 100 MG tablet Take 1 tablet (100 mg total) by mouth 2 (two) times daily. 60 tablet 2  . ergocalciferol (VITAMIN D2) 50000 units capsule Take 1 capsule (50,000 Units total) by mouth once a week. 6 capsule 0  . fluticasone (FLONASE) 50 MCG/ACT nasal spray Place 1 spray into both nostrils daily. (Patient taking differently: Place 1 spray into both nostrils daily as needed for allergies. ) 16 g 2  . insulin NPH-regular Human (NOVOLIN 70/30) (70-30) 100 UNIT/ML injection Inject 25 Units into the skin 2 (two) times daily with a meal.       . levothyroxine (SYNTHROID, LEVOTHROID) 112 MCG tablet Take 112 mcg by mouth daily before breakfast.    . magnesium oxide (MAG-OX) 400 (241.3 Mg) MG tablet Take 1 tablet (400 mg total) by mouth 2 (two) times daily. 60 tablet 1  . methylPREDNISolone (MEDROL DOSEPAK) 4 MG TBPK tablet Take as directed 21 tablet 0  . Omega-3 Fatty Acids (FISH OIL) 1000 MG CAPS Take 1,000 mg by mouth daily.     . polyethylene glycol (MIRALAX / GLYCOLAX) packet Take 17 g by mouth daily. 14 each 0  . propranolol (INDERAL) 10 MG tablet TAKE 1 TABLET BY MOUTH EVERY DAY 90 tablet 1  . TURMERIC PO Take 1 capsule by mouth daily.     . urea (CARMOL) 10 % cream Apply topically as needed. 71 g 0   No current facility-administered medications for this visit.     PHYSICAL EXAMINATION:  ECOG PERFORMANCE STATUS: 1 - Symptomatic but completely ambulatory  Vitals:   02/04/18 0847  BP: (!) 131/96  Pulse: 88  Resp: 18  Temp: 98.6 F (37 C)  SpO2: 99%   Filed Weights   02/04/18 0847  Weight: 162 lb (73.5 kg)     GENERAL:alert, no distress and comfortable SKIN: skin color, texture, turgor are normal, no significant lesions. Mild diffuse dry skin and skin erythema on her face and neck, (+) Moderate diffuse small acne like rash on upper chest, scalp from Vectibix (+) significantly dry skin with cracks of b/l hands and feet EYES: normal, Conjunctiva are pink and non-injected, sclera clear OROPHARYNX:no exudate, no erythema and lips, buccal mucosa, and tongue normal  NECK: supple, thyroid normal size, non-tender, without nodularity LYMPH:  no palpable lymphadenopathy axillary (+) enlarged 0.5-1.0cm LNs in left low posterior neck and one in left inguinal areas LUNGS: clear to auscultation and percussion with normal breathing effort HEART: regular rate & rhythm and no murmurs and no lower extremity edema ABDOMEN:abdomen soft, no hepatomegaly. Mild upper abdominal tenderness.  Musculoskeletal:no cyanosis of digits and no  clubbing  NEURO: alert & oriented x 3 with fluent speech, no focal motor/sensory deficits  LABORATORY DATA:  I have reviewed the data as listed CBC Latest Ref Rng & Units 02/04/2018 01/22/2018 01/06/2018  WBC 4.0 - 10.5 K/uL 7.5 9.5 8.0  Hemoglobin 12.0 - 15.0 g/dL 14.1 13.3 13.9  Hematocrit 36.0 - 46.0 % 43.9 40.8 43.2  Platelets 150 - 400 K/uL 170 195 189     CMP Latest Ref Rng & Units 02/04/2018 01/22/2018 01/06/2018  Glucose 70 - 99 mg/dL 226(H) 104(H) 210(H)  BUN 6 - 20 mg/dL '8 6 8  '$ Creatinine  0.44 - 1.00 mg/dL 0.78 0.68 0.73  Sodium 135 - 145 mmol/L 143 143 141  Potassium 3.5 - 5.1 mmol/L 3.7 3.5 3.9  Chloride 98 - 111 mmol/L 107 108 106  CO2 22 - 32 mmol/L '23 24 24  '$ Calcium 8.9 - 10.3 mg/dL 7.6(L) 7.7(L) 8.2(L)  Total Protein 6.5 - 8.1 g/dL 7.3 6.9 7.5  Total Bilirubin 0.3 - 1.2 mg/dL 0.5 0.6 0.6  Alkaline Phos 38 - 126 U/L 176(H) 163(H) 166(H)  AST 15 - 41 U/L '21 20 19  '$ ALT 0 - 44 U/L '15 16 15    '$ Tumor Markers CEA  PATHOLOGY REPORT:  Diagnosis 06/11/2017 Liver, needle/core biopsy - METASTATIC ADENOCARCINOMA, CONSISTENT WITH COLONIC PRIMARY.   RADIOGRAPHIC STUDIES: I have personally reviewed the radiological images as listed and agreed with the findings in the report.    CT CAP w contrast, 07/23/2017 IMPRESSION 1. Mild progression of hepatic metastasis. 2. Similar rectosigmoid primary with abdominopelvic nodal metastasis. 3.  No acute process or evidence of metastatic disease in the chest. 4. Aortic Atherosclerosis (ICD10-I70.0). Coronary artery atherosclerosis. This is age advanced. 5. Proximal colonic constipation, again suggesting a component of partial obstruction at the primary site. 6. Mild ascending aortic dilatation at 4.1 cm.   ASSESSMENT & PLAN:  47 y.o. African American female, with past medical history of diabetes, hypertension, presented with left lower quadrant abdominal pain, hematochezia, fatigue and weight loss  1. Sigmoid adenocarcinoma,  with abdominopelvic adenopathy and hepatic metastasis, stage IV, MSI-stable , KRAS/NRAS/BRAF wild type  -We previously reviewed medical records including imaging and pathology with the patient and family in detail. She has what appears to be metastatic sigmoid adenocarcinoma to abdominopelvic lymph nodes and liver.  -I previously discussed her liver biopsy results with patient and her mother, which confirmed metastatic colon cancer. I have requested her biopsy to be sent out for genomic testing foundation one -Given her diffuse liver metastasis, her cancer unfortunately is incurable.  We discussed the goal of therapy is palliative to prolong her life and preserve her quality of life. -Patient declined intensive chemotherapy, such as FOLFOX or FOLFIRI, due to the concern of side effects. After multiple discussions, back-and-forth, she finally agreed with Xeloda and vectibix as her first line therapy.  -She started Xeloda and Vectibix on 08/25/17, tolerating well with mild rash and hand-foot syndrome.  -due to her complains of side effects, we have reduced her Xeloda dose.  -Due to her worsening hand-foot syndrome I will reduce dose of this week's Xeloda to '1500mg'$  in the AM and '1000mg'$  in the PM for 7 days. She is agreeable. If her cracks on fingers healed, will resume '1500mg'$  bid dose  -She has had excellent response to treatment, confirmed by CT scan and dramatic decrease of her tumor marker CA -Labs reviewed, CBC overall WNL, other labs still pending. Overall adequate to proceed with Vectibix today and Xeloda at reduced dose tonight.  -f/u every 2-4 weeks  -next restaging scan in Jan 2020, if she gets her insurance again    2. 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   -I previously recommended to intravenous iron, She declined. She has started oral iron. Due to constipation she is only taking 1 a day.  -I previously discussed oral  iron may not be enough and discussed the option of IV iron. She will think about it.  -Hg normal at 14.1 today, iron panel is still pending (02/04/18) -  Continue oral iron once daily   3. DM, HTN, HL -We previously discussed possible effects of chemotherapy and steroid use on her DM, she will need to check BG closely and possibly adjust insulin if hyperglycemia is not well controlled; we reviewed the importance of tight glucose control while on chemotherapy. -I previously recommended she check her random glucose levels at home twice a day and record number. She should see her endocrinologist, Dr. Buddy Duty, if her levels remain high.   4. Weight loss  -She was previously given referral to nutrition to follow while on chemotherapy. We reviewed importance of adequate nutrition in order to tolerate chemotherapy  -I previously advised the patient to start consuming nutritional supplements to aid with weight loss.  -she has gained some weight back   5. Genetics -Due to her young age and personal history of colon cancer, she qualifies for genetics counseling to rule out inheritable cancer syndrome such as Lynch syndrome. She agreed and a referral was made   6. Financial and Social issues, Anxiety, depression -She is a single parent with 72 year old son; her child's father is involved. Patient currently lives with her mother and has social support. She has met our SW and She follows up with Social Worker Hollice Espy -I will refer the patient to a counselor to aid with her anxiety regarding chemotherapy.  -She hasn't tried mirtazapine in the past and Wellbutrin in the past. She is currently on Xanax  -She recently applied for medicaid and disability and was denied. We will continue Xeloda for now and may need to switch to 5-FU Pump.  -she unfortunately lost her insurance, she is currently re-applying   7. Goal of care discussion  -We again discussed the incurable nature of her cancer, and the overall  poor prognosis, especially if she does not have good response to chemotherapy or progress on chemo -The patient understands the goal of care is palliative. -she is full code for now   8. Rash on face, chest, scalp and back, moderate -secondary to Panitumumab -She will continue using hydrocortisone cream and clindamycin gel twice daily -I previously recommend oral antibiotic Doxycycline 100 mg BID to help reduce her rash.  -improved after chemo break  -She prefers natural remedies for her rash and does not use doxy or steroids.  9. Hand-foot syndrome, secondary to Xeloda  -She developed mild hand-foot syndrome, secondary to Xeloda. NP Lacie previously recommended topical hydrocortisone PRN to hands and feet. -currently worse in her feet. She has been using Urea cream and hydrocortisone cream.  -I suggest she use a vinegar soak for her feet at night.   10. Hypomagnesemia, secondary to panitumumab -she has mild hypomagnesemia secondary to panitumumab, she is on oral magnesium, but not compliant  -Her magnesium 1.3 today, I will give IV magnesium 2 g today  -I recommend her to increase magnesium to 2 tablets a day     PLAN: -Labs reviewed and adequate to proceed with Vectibix today and reduce dose Xeloda to '1500mg'$  am and '1000mg'$  evening for this week. If her finger cracks heal, will resume '1500mg'$  q12hr, one week on and one week off  -f/u with Lacie in 2 weeks and me in 6 weeks    All questions were answered. The patient knows to call the clinic with any problems, questions or concerns. No barriers to learning was detected.  I spent 20 minutes counseling the patient face to face. The total time spent in the appointment was 25  minutes and more than 50% was on counseling and review of test results  I, Joslyn Devon, am acting as scribe for Truitt Merle, MD.   I have reviewed the above documentation for accuracy and completeness, and I agree with the above.      Truitt Merle, MD 02/04/2018

## 2018-02-04 NOTE — Patient Instructions (Signed)
[facility] Discharge Instructions for Patients Receiving Chemotherapy  Today you received the following chemotherapy agents: Vectibix.  To help prevent nausea and vomiting after your treatment, we encourage you to take your nausea medication as directed.   If you develop nausea and vomiting that is not controlled by your nausea medication, call the clinic.   BELOW ARE SYMPTOMS THAT SHOULD BE REPORTED IMMEDIATELY:  *FEVER GREATER THAN 100.5 F  *CHILLS WITH OR WITHOUT FEVER  NAUSEA AND VOMITING THAT IS NOT CONTROLLED WITH YOUR NAUSEA MEDICATION  *UNUSUAL SHORTNESS OF BREATH  *UNUSUAL BRUISING OR BLEEDING  TENDERNESS IN MOUTH AND THROAT WITH OR WITHOUT PRESENCE OF ULCERS  *URINARY PROBLEMS  *BOWEL PROBLEMS  UNUSUAL RASH Items with * indicate a potential emergency and should be followed up as soon as possible.  Feel free to call the clinic should you have any questions or concerns. The clinic phone number is [phone].  Please show the CHEMO ALERT CARD at check-in to the Emergency Department and triage nurse.   

## 2018-02-05 ENCOUNTER — Other Ambulatory Visit: Payer: Self-pay

## 2018-02-05 ENCOUNTER — Ambulatory Visit: Payer: Self-pay | Admitting: Nurse Practitioner

## 2018-02-05 ENCOUNTER — Telehealth: Payer: Self-pay | Admitting: Hematology

## 2018-02-05 ENCOUNTER — Ambulatory Visit: Payer: Self-pay

## 2018-02-05 LAB — VITAMIN D 25 HYDROXY (VIT D DEFICIENCY, FRACTURES): Vit D, 25-Hydroxy: 18.6 ng/mL — ABNORMAL LOW (ref 30.0–100.0)

## 2018-02-05 NOTE — Telephone Encounter (Signed)
Called patient and was not able to leave a voice message, the voice message box was full.  Printed and mailed calendar for Dec 2019 and Jan 2020.

## 2018-02-08 ENCOUNTER — Telehealth: Payer: Self-pay | Admitting: Hematology

## 2018-02-08 NOTE — Telephone Encounter (Signed)
Cancelled appt per 11/18 sch message - per patient says go ahead and cancel for now and she will pick back up at next scheduled appt.

## 2018-02-12 ENCOUNTER — Encounter: Payer: Self-pay | Admitting: Nurse Practitioner

## 2018-02-12 ENCOUNTER — Telehealth: Payer: Self-pay | Admitting: *Deleted

## 2018-02-12 NOTE — Telephone Encounter (Signed)
TCT patient regarding her Vitamin D levels. Spoke with patient and advised that her level remains low though a bit better than 1 month ago. Per Cira Rue, NP , advised pt to continue taking the 50,000 IU of Vit D weekly. Pt voiced understanding. Pt also states that she has developed a blister-like area on her right great toe, that is very tender to touch. She has been soaking it in warm soapy water. Discussed this with Lacie, NP and when pt resumes her Xeloda in a week, and if her toe is better then she is to take 3capsules in the morning and 2 in the afternoon. If her toe is not better she is to do 2/2. She can continue to soak daily and use urea cream alternating with hydrocortisone cream. If her toe gets worse, pt is to call here. Pt voiced understanding.

## 2018-02-17 ENCOUNTER — Other Ambulatory Visit: Payer: Self-pay

## 2018-02-17 ENCOUNTER — Ambulatory Visit: Payer: Self-pay | Admitting: Nurse Practitioner

## 2018-02-17 ENCOUNTER — Ambulatory Visit: Payer: Self-pay

## 2018-03-01 ENCOUNTER — Other Ambulatory Visit: Payer: Self-pay | Admitting: Hematology

## 2018-03-01 DIAGNOSIS — C19 Malignant neoplasm of rectosigmoid junction: Secondary | ICD-10-CM

## 2018-03-03 ENCOUNTER — Inpatient Hospital Stay: Payer: Self-pay | Attending: Hematology

## 2018-03-03 ENCOUNTER — Inpatient Hospital Stay (HOSPITAL_BASED_OUTPATIENT_CLINIC_OR_DEPARTMENT_OTHER): Payer: Self-pay | Admitting: Hematology

## 2018-03-03 ENCOUNTER — Inpatient Hospital Stay: Payer: Self-pay

## 2018-03-03 ENCOUNTER — Encounter: Payer: Self-pay | Admitting: General Practice

## 2018-03-03 VITALS — BP 168/96 | HR 78 | Temp 98.4°F | Resp 18 | Ht 65.0 in | Wt 164.2 lb

## 2018-03-03 VITALS — BP 143/90

## 2018-03-03 DIAGNOSIS — D500 Iron deficiency anemia secondary to blood loss (chronic): Secondary | ICD-10-CM | POA: Insufficient documentation

## 2018-03-03 DIAGNOSIS — E119 Type 2 diabetes mellitus without complications: Secondary | ICD-10-CM | POA: Insufficient documentation

## 2018-03-03 DIAGNOSIS — L271 Localized skin eruption due to drugs and medicaments taken internally: Secondary | ICD-10-CM | POA: Insufficient documentation

## 2018-03-03 DIAGNOSIS — F418 Other specified anxiety disorders: Secondary | ICD-10-CM

## 2018-03-03 DIAGNOSIS — Z794 Long term (current) use of insulin: Secondary | ICD-10-CM | POA: Insufficient documentation

## 2018-03-03 DIAGNOSIS — C787 Secondary malignant neoplasm of liver and intrahepatic bile duct: Secondary | ICD-10-CM | POA: Insufficient documentation

## 2018-03-03 DIAGNOSIS — T451X5A Adverse effect of antineoplastic and immunosuppressive drugs, initial encounter: Secondary | ICD-10-CM | POA: Insufficient documentation

## 2018-03-03 DIAGNOSIS — C19 Malignant neoplasm of rectosigmoid junction: Secondary | ICD-10-CM

## 2018-03-03 DIAGNOSIS — I10 Essential (primary) hypertension: Secondary | ICD-10-CM | POA: Insufficient documentation

## 2018-03-03 DIAGNOSIS — Z5112 Encounter for antineoplastic immunotherapy: Secondary | ICD-10-CM | POA: Insufficient documentation

## 2018-03-03 DIAGNOSIS — Z79899 Other long term (current) drug therapy: Secondary | ICD-10-CM

## 2018-03-03 DIAGNOSIS — C187 Malignant neoplasm of sigmoid colon: Secondary | ICD-10-CM

## 2018-03-03 LAB — CMP (CANCER CENTER ONLY)
ALT: 17 U/L (ref 0–44)
AST: 22 U/L (ref 15–41)
Albumin: 3.2 g/dL — ABNORMAL LOW (ref 3.5–5.0)
Alkaline Phosphatase: 171 U/L — ABNORMAL HIGH (ref 38–126)
Anion gap: 12 (ref 5–15)
BUN: 6 mg/dL (ref 6–20)
CO2: 23 mmol/L (ref 22–32)
Calcium: 7 mg/dL — ABNORMAL LOW (ref 8.9–10.3)
Chloride: 107 mmol/L (ref 98–111)
Creatinine: 0.76 mg/dL (ref 0.44–1.00)
GFR, Est AFR Am: >60 mL/min (ref >60)
GFR, Estimated: >60 mL/min (ref >60)
Glucose, Bld: 191 mg/dL — ABNORMAL HIGH (ref 70–99)
Potassium: 3.8 mmol/L (ref 3.5–5.1)
Sodium: 142 mmol/L (ref 135–145)
Total Bilirubin: 0.6 mg/dL (ref 0.3–1.2)
Total Protein: 7 g/dL (ref 6.5–8.1)

## 2018-03-03 LAB — CBC WITH DIFFERENTIAL (CANCER CENTER ONLY)
Abs Immature Granulocytes: 0.02 10*3/uL (ref 0.00–0.07)
Basophils Absolute: 0.1 10*3/uL (ref 0.0–0.1)
Basophils Relative: 1 %
Eosinophils Absolute: 0.7 10*3/uL — ABNORMAL HIGH (ref 0.0–0.5)
Eosinophils Relative: 11 %
HCT: 38.8 % (ref 36.0–46.0)
Hemoglobin: 12.7 g/dL (ref 12.0–15.0)
Immature Granulocytes: 0 %
Lymphocytes Relative: 26 %
Lymphs Abs: 1.6 10*3/uL (ref 0.7–4.0)
MCH: 29.3 pg (ref 26.0–34.0)
MCHC: 32.7 g/dL (ref 30.0–36.0)
MCV: 89.6 fL (ref 80.0–100.0)
Monocytes Absolute: 0.4 10*3/uL (ref 0.1–1.0)
Monocytes Relative: 6 %
Neutro Abs: 3.5 10*3/uL (ref 1.7–7.7)
Neutrophils Relative %: 56 %
Platelet Count: 166 10*3/uL (ref 150–400)
RBC: 4.33 MIL/uL (ref 3.87–5.11)
RDW: 18.5 % — ABNORMAL HIGH (ref 11.5–15.5)
WBC Count: 6.2 10*3/uL (ref 4.0–10.5)
nRBC: 0 % (ref 0.0–0.2)

## 2018-03-03 LAB — IRON AND TIBC
Iron: 51 ug/dL (ref 41–142)
Saturation Ratios: 14 % — ABNORMAL LOW (ref 21–57)
TIBC: 376 ug/dL (ref 236–444)
UIBC: 325 ug/dL (ref 120–384)

## 2018-03-03 LAB — MAGNESIUM: Magnesium: 1.5 mg/dL — ABNORMAL LOW (ref 1.7–2.4)

## 2018-03-03 LAB — FERRITIN: Ferritin: 9 ng/mL — ABNORMAL LOW (ref 11–307)

## 2018-03-03 LAB — CEA (IN HOUSE-CHCC): CEA (CHCC-In House): 26.62 ng/mL — ABNORMAL HIGH (ref 0.00–5.00)

## 2018-03-03 MED ORDER — SODIUM CHLORIDE 0.9 % IV SOLN: 2.0000 g | Freq: Once | INTRAVENOUS | Status: DC

## 2018-03-03 MED ORDER — SODIUM CHLORIDE 0.9 % IV SOLN
6.0000 mg/kg | Freq: Once | INTRAVENOUS | Status: AC
  Administered 2018-03-03: 400 mg via INTRAVENOUS
  Filled 2018-03-03: qty 20

## 2018-03-03 MED ORDER — MAGNESIUM SULFATE 2 GM/50ML IV SOLN
2.0000 g | Freq: Once | INTRAVENOUS | Status: AC
  Administered 2018-03-03: 2 g via INTRAVENOUS
  Filled 2018-03-03: qty 50

## 2018-03-03 MED ORDER — SODIUM CHLORIDE 0.9 % IV SOLN
Freq: Once | INTRAVENOUS | Status: AC
  Administered 2018-03-03: 13:00:00 via INTRAVENOUS
  Filled 2018-03-03: qty 250

## 2018-03-03 MED FILL — XELODA 500 MG TABLET: 500 | 84 days supply | Qty: 294 | Fill #0

## 2018-03-03 NOTE — Progress Notes (Signed)
[place]   [phone] [phone]   Clinic Follow up Note   Patient Care Team: Esaw Grandchild, NP as PCP - General (Family Medicine) Delrae Rend, MD as Consulting Physician (Endocrinology) Truitt Merle, MD as Consulting Physician (Hematology) Alla Feeling, NP as Nurse Practitioner (Nurse Practitioner) Clarene Essex, MD as Consulting Physician (Gastroenterology)  Date of Service:  03/03/2018  CHIEF COMPLAINT: f/u metastatic sigmoid colon cancer  SUMMARY OF ONCOLOGIC HISTORY: Oncology History   Cancer Staging Malignant neoplasm of rectosigmoid junction [facility]) Staging form: Colon and Rectum, AJCC 8th Edition - Clinical stage from 06/11/2017: Stage IVA (cTX, cN1b, pM1a) - Signed by Truitt Merle, MD on 06/15/2017       Malignant neoplasm of rectosigmoid junction ([place])   05/30/2017 Imaging    CT AP IMPRESSION: 1. 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 Abdominopelvic adenopathy. Rectosigmoid mass with suggestion of partial obstruction as evidenced by large colonic stool burden more proximally. 2.  Aortic Atherosclerosis (ICD10-I70.0).  This is age advanced.    05/31/2017 Tumor Marker    CEA 353.3 (elevated) AFP 4.5 (normal)    05/31/2017 Initial Biopsy    Diagnosis Colon, biopsy, sigmoid - INVASIVE ADENOCARCINOMA - SEE COMMENT    05/31/2017 Imaging    CT CHEST IMPRESSION: 1. No evidence of metastatic disease in the chest. 2. No acute findings.  Aortic Atherosclerosis (ICD10-I70.0).     05/31/2017 Procedure    Colonoscopy per Dr. Watt Climes Findings: An infiltrative and ulcerated partially obstructing medium-sized mass was found in the recto-sigmoid colon. The mass was circumferential. The mass measured four cm in length. No bleeding was present.   Impression - One small polyp in the rectum. - The examination was otherwise normal. - Malignant partially obstructing tumor in the  recto-sigmoid colon. Biopsied. - One medium polyp in the proximal sigmoid colon. - The examination was otherwise normal. - Internal hemorrhoids.    06/03/2017 Initial Diagnosis    Malignant neoplasm of transverse colon ([place])    06/11/2017 Cancer Staging    Staging form: Colon and Rectum, AJCC 8th Edition - Clinical stage from 06/11/2017: Stage IVA (cTX, cN1b, pM1a) - Signed by Truitt Merle, MD on 06/15/2017    06/11/2017 Pathology Results    Liver biopsy confirmed metastatic colon cancer    07/23/2017 Imaging    CT CAP IMPRESSION: 1. Mild progression of hepatic metastasis. 2. Similar rectosigmoid primary with abdominopelvic nodal metastasis. 3.  No acute process or evidence of metastatic disease in the chest. 4. Aortic Atherosclerosis (ICD10-I70.0). Coronary artery atherosclerosis. This is age advanced. 5. Proximal colonic constipation, again suggesting a component of partial obstruction at the primary site. 6. Mild ascending aortic dilatation at 4.1 cm.    08/25/2017 -  Chemotherapy    -Xeloda '1500mg'$  BID 1 week on and 1 week off starting 08/25/17. Due to her worsening hand-foot syndrome her dose was reduced to '1500mg'$  in the AM and '1000mg'$  in the PM. -Vectibix every 2 weeks starting 08/25/17    12/14/2017 Imaging    IMPRESSION: 1. Response to therapy. Marked decrease in hepatic metastatic burden. More mild decrease in abdominopelvic adenopathy and definition of rectosigmoid primary. 2.  No acute process or evidence of metastatic disease in the chest. 3.  Aortic Atherosclerosis (ICD10-I70.0).       CURRENT THERAPY:  -Xeloda '1500mg'$  BID 1 week on and 1 week off starting 08/25/17. Increased to '2000mg'$  in the AM and '1500mg'$  in the PM starting 09/08/17, dose was reduced  to 1500 mg BID, 1 week on and one week of subsequently due to skin rash -Vectibix every 2 weeks starting 08/25/17  INTERVAL HISTORY:  Beverly Schwartz is here for a follow up. She presents to the clinic today accompanied by her family  member. She notes she is doing well and has gained weight from eating well. She still has hand-foot syndrome with dry skin and cracking but has improved since she had an extra off week. She denies abdominal pain or nausea. She started Xeloda this week. She has [employer] that starts in January. She has meter at home but does not check BP often. She is on Propranolol '10mg'$ . She notes her BG at home was in the 140s.     REVIEW OF SYSTEMS:  Constitutional: Denies fevers, chills or abnormal weight loss (+) weight gain Eyes: Denies blurriness of vision Ears, nose, mouth, throat, and face: Denies mucositis or sore throat Respiratory: Denies cough, dyspnea or wheezes Cardiovascular: Denies palpitation, chest discomfort or lower extremity swelling Gastrointestinal:  Denies nausea, heartburn or change in bowel habits Skin: Denies abnormal skin rashes (+) hand-foot syndrome with dry skin and cracking Lymphatics: Denies new lymphadenopathy or easy bruising Neurological:Denies numbness, tingling or new weaknesses Behavioral/Psych: Mood is stable, no new changes  All other systems were reviewed with the patient and are negative.  MEDICAL HISTORY:  Past Medical History:  Diagnosis Date  . Anxiety   . Cancer ([place])    colon, liver  . Chest pain   . Colon cancer ([place])   . Complication of anesthesia   . Depression   . Diabetes mellitus   . Dizziness   . Family history of breast cancer   . Family history of stomach cancer   . Hyperlipidemia   . Hypertension   . Hypothyroidism   . Obesity   . PONV (postoperative nausea and vomiting)   . Tachycardia     SURGICAL HISTORY: Past Surgical History:  Procedure Laterality Date  . FLEXIBLE SIGMOIDOSCOPY N/A 05/31/2017   Procedure: FLEXIBLE SIGMOIDOSCOPY;  Surgeon: Clarene Essex, MD;  Location: [facility] ENDOSCOPY;  Service: Endoscopy;  Laterality: N/A;  . WISDOM TOOTH EXTRACTION     age 59's    I have reviewed the social history and family history  with the patient and they are unchanged from previous note.  ALLERGIES:  is allergic to bupropion and amoxicillin.  MEDICATIONS:  Current Outpatient Medications  Medication Sig Dispense Refill  . acetaminophen-codeine (TYLENOL #3) 300-30 MG tablet Take 1 tablet by mouth every 6 (six) hours as needed for moderate pain. 30 tablet 0  . ALPRAZolam (XANAX) 1 MG tablet Take 1 tablet (1 mg total) by mouth at bedtime as needed for anxiety. 30 tablet 0  . atorvastatin (LIPITOR) 10 MG tablet Take 1 tablet (10 mg total) by mouth daily. 90 tablet 3  . azithromycin (ZITHROMAX Z-PAK) 250 MG tablet Take as prescribed 6 each 0  . benzonatate (TESSALON) 100 MG capsule Take 1 capsule (100 mg total) by mouth 3 (three) times daily as needed for cough. 20 capsule 0  . capecitabine (XELODA) 500 MG tablet TAKE 4 TABLETS ('2000MG'$ ) IN AM & AND 3 TABS ('1500MG'$ ) IN PM, WITHIN 30MIN OF FINISHING FOOD. TAKE ON DAYS 1-7 & 15-21 OF EACH 28 DAY CYCLE 294 tablet 0  . cetirizine (ZYRTEC ALLERGY) 10 MG tablet Take 1 tablet (10 mg total) by mouth daily. 30 tablet 1  . CINNAMON PO Take 1 tablet by mouth daily.     Marland Kitchen  clindamycin (CLINDAGEL) 1 % gel Apply topically 2 (two) times daily. 60 g 1  . doxycycline (VIBRA-TABS) 100 MG tablet Take 1 tablet (100 mg total) by mouth 2 (two) times daily. 60 tablet 2  . ergocalciferol (VITAMIN D2) 50000 units capsule Take 1 capsule (50,000 Units total) by mouth once a week. 6 capsule 0  . fluticasone (FLONASE) 50 MCG/ACT nasal spray Place 1 spray into both nostrils daily. (Patient taking differently: Place 1 spray into both nostrils daily as needed for allergies. ) 16 g 2  . insulin NPH-regular Human (NOVOLIN 70/30) (70-30) 100 UNIT/ML injection Inject 25 Units into the skin 2 (two) times daily with a meal.     . levothyroxine (SYNTHROID, LEVOTHROID) 112 MCG tablet Take 112 mcg by mouth daily before breakfast.    . magnesium oxide (MAG-OX) 400 (241.3 Mg) MG tablet Take 1 tablet (400 mg total) by  mouth 2 (two) times daily. 60 tablet 1  . methylPREDNISolone (MEDROL DOSEPAK) 4 MG TBPK tablet Take as directed 21 tablet 0  . Omega-3 Fatty Acids (FISH OIL) 1000 MG CAPS Take 1,000 mg by mouth daily.     . polyethylene glycol (MIRALAX / GLYCOLAX) packet Take 17 g by mouth daily. 14 each 0  . propranolol (INDERAL) 10 MG tablet TAKE 1 TABLET BY MOUTH EVERY DAY 90 tablet 1  . TURMERIC PO Take 1 capsule by mouth daily.     . urea (CARMOL) 10 % cream Apply topically as needed. 71 g 0   No current facility-administered medications for this visit.     PHYSICAL EXAMINATION: ECOG PERFORMANCE STATUS: 1 - Symptomatic but completely ambulatory  Vitals:   03/03/18 1114  BP: (!) 168/96  Pulse: 78  Resp: 18  Temp: 98.4 F (36.9 C)  SpO2: 100%   Filed Weights   03/03/18 1114  Weight: 164 lb 3.2 oz (74.5 kg)    GENERAL:alert, no distress and comfortable SKIN: skin color, texture, turgor are normal, no rashes or significant lesions except a few skin peeling/cracking at right thumb  EYES: normal, Conjunctiva are pink and non-injected, sclera clear OROPHARYNX:no exudate, no erythema and lips, buccal mucosa, and tongue normal  NECK: supple, thyroid normal size, non-tender, without nodularity LYMPH:  no palpable lymphadenopathy in the cervical, axillary or inguinal LUNGS: clear to auscultation and percussion with normal breathing effort HEART: regular rate & rhythm and no murmurs and no lower extremity edema ABDOMEN:abdomen soft, non-tender and normal bowel sounds Musculoskeletal:no cyanosis of digits and no clubbing  NEURO: alert & oriented x 3 with fluent speech, no focal motor/sensory deficits  LABORATORY DATA:  I have reviewed the data as listed CBC Latest Ref Rng & Units 03/03/2018 02/04/2018 01/22/2018  WBC 4.0 - 10.5 K/uL 6.2 7.5 9.5  Hemoglobin 12.0 - 15.0 g/dL 12.7 14.1 13.3  Hematocrit 36.0 - 46.0 % 38.8 43.9 40.8  Platelets 150 - 400 K/uL 166 170 195     CMP Latest Ref Rng &  Units 03/03/2018 02/04/2018 01/22/2018  Glucose 70 - 99 mg/dL 191(H) 226(H) 104(H)  BUN 6 - 20 mg/dL '6 8 6  '$ Creatinine 0.44 - 1.00 mg/dL 0.76 0.78 0.68  Sodium 135 - 145 mmol/L 142 143 143  Potassium 3.5 - 5.1 mmol/L 3.8 3.7 3.5  Chloride 98 - 111 mmol/L 107 107 108  CO2 22 - 32 mmol/L '23 23 24  '$ Calcium 8.9 - 10.3 mg/dL 7.0(L) 7.6(L) 7.7(L)  Total Protein 6.5 - 8.1 g/dL 7.0 7.3 6.9  Total Bilirubin 0.3 - 1.2  mg/dL 0.6 0.5 0.6  Alkaline Phos 38 - 126 U/L 171(H) 176(H) 163(H)  AST 15 - 41 U/L '22 21 20  '$ ALT 0 - 44 U/L '17 15 16      '$ RADIOGRAPHIC STUDIES: I have personally reviewed the radiological images as listed and agreed with the findings in the report. No results found.   ASSESSMENT & PLAN:  Beverly Schwartz is a 47 y.o. female with   1. Sigmoid adenocarcinoma, with abdominopelvic adenopathy andhepatic metastasis, stage IV, MSI-stable , KRAS/NRAS/BRAF wild type  -She was diagnosed in 05/2017. Given her liver mets, her cancer is not curable and surgery is not an option.  -Patient declined intensive chemotherapy, such as FOLFOX or FOLFIRI, due to the concern of side effects. After multiple discussions, back-and-forth, she finally agreed with Xeloda every other week and Vectibix q2weeks as her first line therapy starting 08/25/17.  -Due to her worsening hand-foot syndrome her dose was reduced to '1500mg'$  in the AM and '1000mg'$  in the PM for 7 days last cycle.  She has recovered well, will restart next cycle Xeloda today at 1500 mg twice daily for 1 week on and one-week off. -Labs revewied, MG at 1.7, Glucose at 191, Ca at 7, CBC WNL. Overall adequate to proceed with Vectibix and Xeloda.  -F/u in 1 month   2. Anemia, Iron deficiency -secondary to blood loss from her tumor. -I previously recommended to intravenous iron, She will think about it  -She is currently on oral iron once daily. Will continue  -Hg and iron levels normal, Sat at 14. Ferritin is still pending. (03/03/18)  3.  DM, HTN, HL -She is currently on Propranolol and insulin.  -I discussed her propranolol may not be enough. I encouraged her to see her PCP and continue to follow up with endocrinologist, Dr. Buddy Duty, if her levels remain high.   4. Weight loss  -She follows up with dietician as needed -Her weight is trending up  5.Genetics -She was seen by genetic counselor, results were negative  6. Financial and Social issues, Anxiety, depression -She follows up as needed with Social Worker Hollice Espy -She is currently on Xanax  -She has regained insurance lately, will start [place] [employer] insurance in 2020  7. Goal of care discussion  -She understands the goal of care is palliative, her cancer is incurable.  The goal of therapy is to prolong her life and improve her quality of life. -She is full code for now   8. Rash on face, chest, scalp and back, moderate -secondary to Panitumumab -She will continue using hydrocortisone cream and clindamycin gel twice daily  -She prefers natural remedies for her rash and does not use doxy or steroids. -Rash has improved lately, it is minimal now  9. Hand-foot syndrome, secondary to Xeloda  -She developed mild hand-foot syndrome, secondary to Xeloda. \ -She will continue to use Urea cream    10. Hypomagnesemia, secondary to panitumumab -she has mild hypomagnesemia secondary to panitumumab, she is on oral magnesium BID but not very compliant, reinforced the importance of compliance -Her magnesium 1.5 today (03/03/18), will give IV mag 2 g today   PLAN: -Labs reviewed and adequate to proceed with Vectibix today and continue every 2 weeks  -start next cycle Xeloda to '1500mg'$  BID today, for one week on and one week off.  -CT CAP w contrast the first week of jan 2020  -Follow-up in 4 weeks   No problem-specific Assessment & Plan notes found for this encounter.  No orders of the defined types were placed in this encounter.  All questions were  answered. The patient knows to call the clinic with any problems, questions or concerns. No barriers to learning was detected. I spent 20 minutes counseling the patient face to face. The total time spent in the appointment was 25 minutes and more than 50% was on counseling and review of test results     Truitt Merle, MD 03/03/2018   I, Joslyn Devon, am acting as scribe for Truitt Merle, MD.   I have reviewed the above documentation for accuracy and completeness, and I agree with the above.

## 2018-03-03 NOTE — Progress Notes (Signed)
[place] CSW Progress Notes  Met w patient in infusion room at patient and RN request. Patient reports she is struggling w significant anxiety which often prevents her from falling asleep.  Reports racing thoughts, rumination, nervousness as well as intermittent depressed mood.  Although has been diagnosed/treated for depression in the past, anxiety has now become more prominent.  Has tried trials of medications, none of which have helped.  Worries include financial, stress on family/mother, concern for her 56 yo son, difficulty processing living in the context of Stage IV cancer.  Initially, patient was given short time frame until death from cancer.  Now that treatments have been "working" per patient to shrink the tumor burden, she now needs to make plans for the future including stabilizing housing, finding meaningful activities/employment, parenting a teenage son.  Requests regular meetings for counseling/support.  Will meet w patient on Weds 12/18 at 11 AM for first session.  Edwyna Shell, LCSW Clinical Social Worker Phone:  [phone]

## 2018-03-03 NOTE — Progress Notes (Signed)
03/03/18  Proceed with Vectibix 400 mg today despite weight increase.  WIll assess with future doses and weight.  Henreitta Leber, PharmD

## 2018-03-03 NOTE — Progress Notes (Signed)
Ok to tx with Mag 1.7 today per Dr. Burr Medico.

## 2018-03-03 NOTE — Patient Instructions (Signed)
[facility] Discharge Instructions for Patients Receiving Chemotherapy  Today you received the following chemotherapy agents: Vectibix.  To help prevent nausea and vomiting after your treatment, we encourage you to take your nausea medication as directed.   If you develop nausea and vomiting that is not controlled by your nausea medication, call the clinic.   BELOW ARE SYMPTOMS THAT SHOULD BE REPORTED IMMEDIATELY:  *FEVER GREATER THAN 100.5 F  *CHILLS WITH OR WITHOUT FEVER  NAUSEA AND VOMITING THAT IS NOT CONTROLLED WITH YOUR NAUSEA MEDICATION  *UNUSUAL SHORTNESS OF BREATH  *UNUSUAL BRUISING OR BLEEDING  TENDERNESS IN MOUTH AND THROAT WITH OR WITHOUT PRESENCE OF ULCERS  *URINARY PROBLEMS  *BOWEL PROBLEMS  UNUSUAL RASH Items with * indicate a potential emergency and should be followed up as soon as possible.  Feel free to call the clinic should you have any questions or concerns. The clinic phone number is [phone].  Please show the CHEMO ALERT CARD at check-in to the Emergency Department and triage nurse.   

## 2018-03-04 ENCOUNTER — Encounter: Payer: Self-pay | Admitting: Hematology

## 2018-03-05 ENCOUNTER — Telehealth: Payer: Self-pay | Admitting: Hematology

## 2018-03-05 NOTE — Telephone Encounter (Signed)
Spoke to patient about 01/23 appointments, per 12/12 los.  Patient stated she was mychart active and did not need a calendar mailed out.

## 2018-03-11 ENCOUNTER — Other Ambulatory Visit: Payer: Self-pay | Admitting: Nurse Practitioner

## 2018-03-11 DIAGNOSIS — F419 Anxiety disorder, unspecified: Secondary | ICD-10-CM

## 2018-03-12 ENCOUNTER — Other Ambulatory Visit: Payer: Self-pay | Admitting: Hematology

## 2018-03-12 DIAGNOSIS — F419 Anxiety disorder, unspecified: Secondary | ICD-10-CM

## 2018-03-12 MED ORDER — ALPRAZOLAM 1 MG PO TABS: 1.0000 mg | ORAL_TABLET | Freq: Every evening | ORAL | 0 refills | Status: DC | PRN

## 2018-03-12 MED FILL — ALPRAZolam 1 MG TABS: 1 | 30 days supply | Qty: 30 | Fill #0

## 2018-03-12 NOTE — Telephone Encounter (Signed)
Awaiting Dr. Ernestina Penna ok to refill

## 2018-03-18 ENCOUNTER — Inpatient Hospital Stay: Payer: Self-pay

## 2018-03-18 ENCOUNTER — Other Ambulatory Visit: Payer: Self-pay | Admitting: Emergency Medicine

## 2018-03-18 ENCOUNTER — Other Ambulatory Visit: Payer: Self-pay | Admitting: Hematology

## 2018-03-18 VITALS — BP 145/99 | HR 88 | Temp 98.8°F | Resp 14

## 2018-03-18 DIAGNOSIS — C19 Malignant neoplasm of rectosigmoid junction: Secondary | ICD-10-CM

## 2018-03-18 DIAGNOSIS — D500 Iron deficiency anemia secondary to blood loss (chronic): Secondary | ICD-10-CM

## 2018-03-18 DIAGNOSIS — C187 Malignant neoplasm of sigmoid colon: Secondary | ICD-10-CM

## 2018-03-18 LAB — CMP (CANCER CENTER ONLY)
ALT: 13 U/L (ref 0–44)
AST: 16 U/L (ref 15–41)
Albumin: 3.2 g/dL — ABNORMAL LOW (ref 3.5–5.0)
Alkaline Phosphatase: 183 U/L — ABNORMAL HIGH (ref 38–126)
Anion gap: 8 (ref 5–15)
BUN: 5 mg/dL — ABNORMAL LOW (ref 6–20)
CO2: 27 mmol/L (ref 22–32)
Calcium: 7.2 mg/dL — ABNORMAL LOW (ref 8.9–10.3)
Chloride: 105 mmol/L (ref 98–111)
Creatinine: 0.69 mg/dL (ref 0.44–1.00)
GFR, Est AFR Am: >60 mL/min (ref >60)
GFR, Estimated: >60 mL/min (ref >60)
Glucose, Bld: 205 mg/dL — ABNORMAL HIGH (ref 70–99)
Potassium: 3.2 mmol/L — ABNORMAL LOW (ref 3.5–5.1)
Sodium: 140 mmol/L (ref 135–145)
Total Bilirubin: 0.6 mg/dL (ref 0.3–1.2)
Total Protein: 6.9 g/dL (ref 6.5–8.1)

## 2018-03-18 LAB — CBC WITH DIFFERENTIAL (CANCER CENTER ONLY)
Abs Immature Granulocytes: 0.01 10*3/uL (ref 0.00–0.07)
Basophils Absolute: 0 10*3/uL (ref 0.0–0.1)
Basophils Relative: 0 %
Eosinophils Absolute: 0.5 10*3/uL (ref 0.0–0.5)
Eosinophils Relative: 6 %
HCT: 41 % (ref 36.0–46.0)
Hemoglobin: 13.4 g/dL (ref 12.0–15.0)
Immature Granulocytes: 0 %
Lymphocytes Relative: 23 %
Lymphs Abs: 2.2 10*3/uL (ref 0.7–4.0)
MCH: 29.6 pg (ref 26.0–34.0)
MCHC: 32.7 g/dL (ref 30.0–36.0)
MCV: 90.7 fL (ref 80.0–100.0)
Monocytes Absolute: 0.5 10*3/uL (ref 0.1–1.0)
Monocytes Relative: 6 %
Neutro Abs: 6.2 10*3/uL (ref 1.7–7.7)
Neutrophils Relative %: 65 %
Platelet Count: 202 10*3/uL (ref 150–400)
RBC: 4.52 MIL/uL (ref 3.87–5.11)
RDW: 17.4 % — ABNORMAL HIGH (ref 11.5–15.5)
WBC Count: 9.5 10*3/uL (ref 4.0–10.5)
nRBC: 0 % (ref 0.0–0.2)

## 2018-03-18 LAB — MAGNESIUM: Magnesium: 1.4 mg/dL — CL (ref 1.7–2.4)

## 2018-03-18 MED ORDER — SODIUM CHLORIDE 0.9 % IV SOLN: 2.0000 g | Freq: Once | INTRAVENOUS | Status: DC

## 2018-03-18 MED ORDER — MAGNESIUM SULFATE 2 GM/50ML IV SOLN
2.0000 g | Freq: Once | INTRAVENOUS | Status: DC
  Filled 2018-03-18: qty 50

## 2018-03-18 MED ORDER — SODIUM CHLORIDE 0.9 % IV SOLN
6.0000 mg/kg | Freq: Once | INTRAVENOUS | Status: AC
  Administered 2018-03-18: 400 mg via INTRAVENOUS
  Filled 2018-03-18: qty 20

## 2018-03-18 MED ORDER — SODIUM CHLORIDE 0.9 % IV SOLN
Freq: Once | INTRAVENOUS | Status: DC
  Filled 2018-03-18: qty 250

## 2018-03-18 NOTE — Progress Notes (Signed)
Pt states during flushing after vectibix "I don't want IV magnesium, do I really need it or can I just take an oral supplement?"  Pt's current magnesium is 1.4.  Spoke with MD Burr Medico who suggested Saturday infusion of IV mag.  Pt refused again.  Pt verbalized understanding of potential risks of refusing IV iron.  Verbal order from MD Burr Medico for pt to take oral magnesium (400 mg twice daily) scrip that she already has at home, as well as to start a diet with high magnesium foods and fluids with electrolytes.  Pt verbalized understanding of teaching from RN and side effects to look out for/how to contact the [facility].  Pt to have labs rechecked on 03/25/18 per MD Burr Medico with possible [facility] visit with PA Lucianne Lei for assessment/follow up.  Pt verbalized understanding of plan.

## 2018-03-18 NOTE — Patient Instructions (Addendum)
[place] Discharge Instructions for Patients Receiving Chemotherapy  Today you received the following chemotherapy agents: Vectibix.  To help prevent nausea and vomiting after your treatment, we encourage you to take your nausea medication as directed.  If you develop nausea and vomiting that is not controlled by your nausea medication, call the clinic.   BELOW ARE SYMPTOMS THAT SHOULD BE REPORTED IMMEDIATELY:  *FEVER GREATER THAN 100.5 F  *CHILLS WITH OR WITHOUT FEVER  NAUSEA AND VOMITING THAT IS NOT CONTROLLED WITH YOUR NAUSEA MEDICATION  *UNUSUAL SHORTNESS OF BREATH  *UNUSUAL BRUISING OR BLEEDING  TENDERNESS IN MOUTH AND THROAT WITH OR WITHOUT PRESENCE OF ULCERS  *URINARY PROBLEMS  *BOWEL PROBLEMS  UNUSUAL RASH Items with * indicate a potential emergency and should be followed up as soon as possible.  Feel free to call the clinic should you have any questions or concerns. The clinic phone number is [phone].  Please show the [place] at check-in to the Emergency Department and triage nurse.     Hypomagnesemia Hypomagnesemia is a condition in which the level of magnesium in the blood is low. Magnesium is a mineral that is found in many foods. It is used in many different processes in the body. Hypomagnesemia can affect every organ in the body. In severe cases, it can cause life-threatening problems. What are the causes? This condition may be caused by:  Not getting enough magnesium in your diet.  Malnutrition.  Problems with absorbing magnesium from the intestines.  Dehydration.  Alcohol abuse.  Vomiting.  Severe or chronic diarrhea.  Some medicines, including medicines that make you urinate more (diuretics).  Certain diseases, such as kidney disease, diabetes, celiac disease, and overactive thyroid. What are the signs or symptoms? Symptoms of this condition include:  Loss of appetite.  Nausea and vomiting.  Involuntary  shaking or trembling of a body part (tremor).  Muscle weakness.  Tingling in the arms and legs.  Sudden tightening of muscles (muscle spasms).  Confusion.  Psychiatric issues, such as depression, irritability, or psychosis.  A feeling of fluttering of the heart.  Seizures. These symptoms are more severe if magnesium levels drop suddenly. How is this diagnosed? This condition may be diagnosed based on:  Your symptoms and medical history.  A physical exam.  Blood and urine tests. How is this treated? Treatment depends on the cause and the severity of the condition. It may be treated with:  A magnesium supplement. This can be taken in pill form. If the condition is severe, magnesium is usually given through an IV.  Changes to your diet. You may be directed to eat foods that have a lot of magnesium, such as green leafy vegetables, peas, beans, and nuts.  Stopping any intake of alcohol. Follow these instructions at home:      Make sure that your diet includes foods with magnesium. Foods that have a lot of magnesium in them include: ? Green leafy vegetables, such as spinach and broccoli. ? Beans and peas. ? Nuts and seeds, such as almonds and sunflower seeds. ? Whole grains, such as whole grain bread and fortified cereals.  Take magnesium supplements if your health care provider tells you to do that. Take them as directed.  Take over-the-counter and prescription medicines only as told by your health care provider.  Have your magnesium levels monitored as told by your health care provider.  When you are active, drink fluids that contain electrolytes.  Avoid drinking alcohol.  Keep all  follow-up visits as told by your health care provider. This is important. Contact a health care provider if:  You get worse instead of better.  Your symptoms return. Get help right away if you:  Develop severe muscle weakness.  Have trouble breathing.  Feel that your heart is  racing. Summary  Hypomagnesemia is a condition in which the level of magnesium in the blood is low.  Hypomagnesemia can affect every organ in the body.  Treatment may include eating more foods that contain magnesium, taking magnesium supplements, and not drinking alcohol.  Have your magnesium levels monitored as told by your health care provider. This information is not intended to replace advice given to you by your health care provider. Make sure you discuss any questions you have with your health care provider. Document Released: 12/04/2004 Document Revised: 02/09/2017 Document Reviewed: 02/09/2017 Elsevier Interactive Patient Education  2019 [employer].

## 2018-03-18 NOTE — Progress Notes (Signed)
03/18/18  Maintain today's dose at 400 mg despite weight change per Dr Burr Medico.  Will get with Theotis Burrow with Patient assistance to see if we can increase allotment to 500 mg  T.O. Dr Lavonda Jumbo, PharmD

## 2018-03-18 NOTE — Progress Notes (Signed)
Okay to treat today, per Dr. Feng. 

## 2018-03-19 ENCOUNTER — Telehealth: Payer: Self-pay | Admitting: Medical

## 2018-03-19 NOTE — Telephone Encounter (Signed)
Pt aware of appt per 12/27 sch message

## 2018-03-25 ENCOUNTER — Inpatient Hospital Stay: Payer: BLUE CROSS/BLUE SHIELD

## 2018-03-25 ENCOUNTER — Inpatient Hospital Stay: Payer: BLUE CROSS/BLUE SHIELD | Attending: Hematology | Admitting: Medical

## 2018-03-25 DIAGNOSIS — E119 Type 2 diabetes mellitus without complications: Secondary | ICD-10-CM | POA: Diagnosis not present

## 2018-03-25 DIAGNOSIS — G620 Drug-induced polyneuropathy: Secondary | ICD-10-CM | POA: Insufficient documentation

## 2018-03-25 DIAGNOSIS — E669 Obesity, unspecified: Secondary | ICD-10-CM | POA: Insufficient documentation

## 2018-03-25 DIAGNOSIS — Z79899 Other long term (current) drug therapy: Secondary | ICD-10-CM | POA: Diagnosis not present

## 2018-03-25 DIAGNOSIS — C19 Malignant neoplasm of rectosigmoid junction: Secondary | ICD-10-CM

## 2018-03-25 DIAGNOSIS — E039 Hypothyroidism, unspecified: Secondary | ICD-10-CM | POA: Diagnosis not present

## 2018-03-25 DIAGNOSIS — I10 Essential (primary) hypertension: Secondary | ICD-10-CM | POA: Insufficient documentation

## 2018-03-25 DIAGNOSIS — C787 Secondary malignant neoplasm of liver and intrahepatic bile duct: Secondary | ICD-10-CM | POA: Diagnosis not present

## 2018-03-25 DIAGNOSIS — Z5112 Encounter for antineoplastic immunotherapy: Secondary | ICD-10-CM | POA: Insufficient documentation

## 2018-03-25 LAB — CMP (CANCER CENTER ONLY)
ALT: 16 U/L (ref 0–44)
AST: 22 U/L (ref 15–41)
Albumin: 3.4 g/dL — ABNORMAL LOW (ref 3.5–5.0)
Alkaline Phosphatase: 164 U/L — ABNORMAL HIGH (ref 38–126)
Anion gap: 9 (ref 5–15)
BUN: 6 mg/dL (ref 6–20)
CO2: 28 mmol/L (ref 22–32)
Calcium: 7.3 mg/dL — ABNORMAL LOW (ref 8.9–10.3)
Chloride: 105 mmol/L (ref 98–111)
Creatinine: 0.72 mg/dL (ref 0.44–1.00)
GFR, Est AFR Am: >60 mL/min (ref >60)
GFR, Estimated: >60 mL/min (ref >60)
Glucose, Bld: 88 mg/dL (ref 70–99)
Potassium: 3.5 mmol/L (ref 3.5–5.1)
Sodium: 142 mmol/L (ref 135–145)
Total Bilirubin: 0.5 mg/dL (ref 0.3–1.2)
Total Protein: 7.6 g/dL (ref 6.5–8.1)

## 2018-03-25 LAB — CBC WITH DIFFERENTIAL (CANCER CENTER ONLY)
Abs Immature Granulocytes: 0.02 10*3/uL (ref 0.00–0.07)
Basophils Absolute: 0.1 10*3/uL (ref 0.0–0.1)
Basophils Relative: 1 %
Eosinophils Absolute: 0.4 10*3/uL (ref 0.0–0.5)
Eosinophils Relative: 6 %
HCT: 43.6 % (ref 36.0–46.0)
Hemoglobin: 14.1 g/dL (ref 12.0–15.0)
Immature Granulocytes: 0 %
Lymphocytes Relative: 27 %
Lymphs Abs: 2 10*3/uL (ref 0.7–4.0)
MCH: 29.7 pg (ref 26.0–34.0)
MCHC: 32.3 g/dL (ref 30.0–36.0)
MCV: 92 fL (ref 80.0–100.0)
Monocytes Absolute: 0.5 10*3/uL (ref 0.1–1.0)
Monocytes Relative: 7 %
Neutro Abs: 4.3 10*3/uL (ref 1.7–7.7)
Neutrophils Relative %: 59 %
Platelet Count: 235 10*3/uL (ref 150–400)
RBC: 4.74 MIL/uL (ref 3.87–5.11)
RDW: 17.2 % — ABNORMAL HIGH (ref 11.5–15.5)
WBC Count: 7.2 10*3/uL (ref 4.0–10.5)
nRBC: 0 % (ref 0.0–0.2)

## 2018-03-25 LAB — MAGNESIUM: Magnesium: 1.5 mg/dL — ABNORMAL LOW (ref 1.7–2.4)

## 2018-03-25 NOTE — Patient Instructions (Signed)
Hypomagnesemia  Hypomagnesemia is a condition in which the level of magnesium in the blood is low. Magnesium is a mineral that is found in many foods. It is used in many different processes in the body. Hypomagnesemia can affect every organ in the body. In severe cases, it can cause life-threatening problems.  What are the causes?  This condition may be caused by:   Not getting enough magnesium in your diet.   Malnutrition.   Problems with absorbing magnesium from the intestines.   Dehydration.   Alcohol abuse.   Vomiting.   Severe or chronic diarrhea.   Some medicines, including medicines that make you urinate more (diuretics).   Certain diseases, such as kidney disease, diabetes, celiac disease, and overactive thyroid.  What are the signs or symptoms?  Symptoms of this condition include:   Loss of appetite.   Nausea and vomiting.   Involuntary shaking or trembling of a body part (tremor).   Muscle weakness.   Tingling in the arms and legs.   Sudden tightening of muscles (muscle spasms).   Confusion.   Psychiatric issues, such as depression, irritability, or psychosis.   A feeling of fluttering of the heart.   Seizures.  These symptoms are more severe if magnesium levels drop suddenly.  How is this diagnosed?  This condition may be diagnosed based on:   Your symptoms and medical history.   A physical exam.   Blood and urine tests.  How is this treated?  Treatment depends on the cause and the severity of the condition. It may be treated with:   A magnesium supplement. This can be taken in pill form. If the condition is severe, magnesium is usually given through an IV.   Changes to your diet. You may be directed to eat foods that have a lot of magnesium, such as green leafy vegetables, peas, beans, and nuts.   Stopping any intake of alcohol.  Follow these instructions at home:          Make sure that your diet includes foods with magnesium. Foods that have a lot of magnesium in them  include:  ? Green leafy vegetables, such as spinach and broccoli.  ? Beans and peas.  ? Nuts and seeds, such as almonds and sunflower seeds.  ? Whole grains, such as whole grain bread and fortified cereals.   Take magnesium supplements if your health care provider tells you to do that. Take them as directed.   Take over-the-counter and prescription medicines only as told by your health care provider.   Have your magnesium levels monitored as told by your health care provider.   When you are active, drink fluids that contain electrolytes.   Avoid drinking alcohol.   Keep all follow-up visits as told by your health care provider. This is important.  Contact a health care provider if:   You get worse instead of better.   Your symptoms return.  Get help right away if you:   Develop severe muscle weakness.   Have trouble breathing.   Feel that your heart is racing.  Summary   Hypomagnesemia is a condition in which the level of magnesium in the blood is low.   Hypomagnesemia can affect every organ in the body.   Treatment may include eating more foods that contain magnesium, taking magnesium supplements, and not drinking alcohol.   Have your magnesium levels monitored as told by your health care provider.  This information is not intended to replace advice given   to you by your health care provider. Make sure you discuss any questions you have with your health care provider.  Document Released: 12/04/2004 Document Revised: 02/09/2017 Document Reviewed: 02/09/2017  Elsevier Interactive Patient Education  2019 [employer]

## 2018-03-25 NOTE — Progress Notes (Signed)
Pt returns for re-eval of labs today.  States she has been taking oral magnesium every day since last seen and has increased high magnesium foods/high electrolyte food and drink into her diet.  Denies any cardiac or muscular issues.  Denies any pain or any other issues.  Per PA Lucianne Lei pt does not require IV mag or IV potassium today.

## 2018-03-26 ENCOUNTER — Telehealth: Payer: Self-pay | Admitting: Medical

## 2018-03-26 NOTE — Telephone Encounter (Signed)
No los per 01/02 los °

## 2018-03-29 NOTE — Progress Notes (Signed)
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 Abdominopelvic adenopathy. Rectosigmoid mass with suggestion of partial obstruction as evidenced by large colonic stool burden more proximally. 2.  Aortic Atherosclerosis (ICD10-I70.0).  This is age advanced.    05/31/2017 Tumor Marker    CEA 353.3 (elevated) AFP 4.5 (normal)    05/31/2017 Initial Biopsy    Diagnosis Colon, biopsy, sigmoid - INVASIVE ADENOCARCINOMA - SEE COMMENT    05/31/2017 Imaging    CT CHEST IMPRESSION: 1. No evidence of metastatic disease in the chest. 2. No acute findings.  Aortic Atherosclerosis (ICD10-I70.0).     05/31/2017 Procedure    Colonoscopy per Dr. Watt Climes Findings: An infiltrative and ulcerated partially obstructing medium-sized mass was found in the recto-sigmoid colon. The mass was circumferential. The mass measured four cm in length. No bleeding was present.   Impression - One small polyp in the rectum. - The examination was otherwise normal. - Malignant partially obstructing tumor in the recto-sigmoid  colon. Biopsied. - One medium polyp in the proximal sigmoid colon. - The examination was otherwise normal. - Internal hemorrhoids.    06/03/2017 Initial Diagnosis    Malignant neoplasm of transverse colon ([place])    06/11/2017 Cancer Staging    Staging form: Colon and Rectum, AJCC 8th Edition - Clinical stage from 06/11/2017: Stage IVA (cTX, cN1b, pM1a) - Signed by Truitt Merle, MD on 06/15/2017    06/11/2017 Pathology Results    Liver biopsy confirmed metastatic colon cancer    07/23/2017 Imaging    CT CAP IMPRESSION: 1. Mild progression of hepatic metastasis. 2. Similar rectosigmoid primary with abdominopelvic nodal metastasis. 3.  No acute process or evidence of metastatic disease in the chest. 4. Aortic Atherosclerosis (ICD10-I70.0). Coronary artery atherosclerosis. This is age advanced. 5. Proximal colonic constipation, again suggesting a component of partial obstruction at the primary site. 6. Mild ascending aortic dilatation at 4.1 cm.    08/25/2017 -  Chemotherapy    -Xeloda '1500mg'$  BID 1 week on and 1 week off starting 08/25/17. Due to her worsening hand-foot syndrome her dose was reduced to '1500mg'$  in the AM and '1000mg'$  in the PM. -Vectibix every 2 weeks starting 08/25/17    12/14/2017 Imaging    IMPRESSION: 1. Response to therapy. Marked decrease in hepatic metastatic burden. More mild decrease in abdominopelvic adenopathy and definition of rectosigmoid primary. 2.  No acute process or evidence of metastatic disease in the chest. 3.  Aortic Atherosclerosis (ICD10-I70.0).      CURRENT THERAPY  -Xeloda '1500mg'$  BID 1 week on and 1 week off -Vectibix every 2 weeks starting 08/25/17   INTERVAL HISTORY: Beverly Schwartz is a 48 y.o. female who is here for  follow-up. She recently saw PA Sandi Mealy for hypomagnesemia.  Today, she is here with her family member. She's been having bronchitis since Friday and is on antibiotics since yesterday. She has seen her PCP and ruled out influenza. She  has a mild productive cough. She is expected to be on chemo this week. She is overall improving, but still fatigued.    Pertinent positives and negatives of review of systems are listed and detailed within the above HPI.  REVIEW OF SYSTEMS:   Constitutional: Denies fevers, chills or abnormal weight loss (+) fatigue Eyes: Denies blurriness of vision Ears, nose, mouth, throat, and face: Denies mucositis or sore throat Respiratory: Denies dyspnea or wheezes (+) mild productive cough Cardiovascular: Denies palpitation, chest discomfort or lower extremity swelling Gastrointestinal:  Denies nausea, heartburn or change in bowel habits Skin: Denies abnormal skin rashes Lymphatics: Denies new lymphadenopathy or easy bruising Neurological:Denies numbness, tingling or new weaknesses Behavioral/Psych: Mood is stable, no new changes  All other systems were reviewed with the patient and are negative.  MEDICAL HISTORY:  Past Medical History:  Diagnosis Date  . Anxiety   . Cancer ([place])    colon, liver  . Chest pain   . Colon cancer ([place])   . Complication of anesthesia   . Depression   . Diabetes mellitus   . Dizziness   . Family history of breast cancer   . Family history of stomach cancer   . Hyperlipidemia   . Hypertension   . Hypothyroidism   . Obesity   . PONV (postoperative nausea and vomiting)   . Tachycardia     SURGICAL HISTORY: Past Surgical History:  Procedure Laterality Date  . FLEXIBLE SIGMOIDOSCOPY N/A 05/31/2017   Procedure: FLEXIBLE SIGMOIDOSCOPY;  Surgeon: Clarene Essex, MD;  Location: [facility] ENDOSCOPY;  Service: Endoscopy;  Laterality: N/A;  . WISDOM TOOTH EXTRACTION     age 72's    I have reviewed the social history and family history with the patient and they are unchanged from previous note.  ALLERGIES:  is allergic to bupropion and amoxicillin.  MEDICATIONS:  Current Outpatient Medications  Medication Sig Dispense Refill  . acetaminophen-codeine (TYLENOL #3)  300-30 MG tablet Take 1 tablet by mouth every 6 (six) hours as needed for moderate pain. 30 tablet 0  . albuterol (PROVENTIL HFA;VENTOLIN HFA) 108 (90 Base) MCG/ACT inhaler Inhale 2 puffs into the lungs every 6 (six) hours as needed for wheezing or shortness of breath. 1 Inhaler 0  . ALPRAZolam (XANAX) 1 MG tablet Take 1 tablet (1 mg total) by mouth at bedtime as needed for anxiety. 30 tablet 0  . atorvastatin (LIPITOR) 10 MG tablet Take 1 tablet (10 mg total) by mouth daily. 90 tablet 3  . azithromycin (ZITHROMAX) 250 MG tablet 2 tabs day one.  1 tab days two-five 6 tablet 0  . benzonatate (TESSALON) 100 MG capsule Take 1 capsule (100 mg total) by mouth 3 (three) times daily as needed for cough. 20 capsule 0  . capecitabine (XELODA) 500 MG tablet TAKE 4 TABLETS ('2000MG'$ ) IN AM & AND 3 TABS ('1500MG'$ ) IN PM, WITHIN 30MIN OF FINISHING FOOD. TAKE ON DAYS 1-7 & 15-21 OF EACH 28 DAY CYCLE 294 tablet 0  . cetirizine (ZYRTEC ALLERGY) 10 MG tablet Take 1 tablet (10 mg total) by mouth daily. 30 tablet 1  . CINNAMON PO Take 1 tablet by mouth daily.     . clindamycin (CLINDAGEL) 1 % gel Apply topically 2 (two) times daily. 60 g 1  .  ergocalciferol (VITAMIN D2) 50000 units capsule Take 1 capsule (50,000 Units total) by mouth once a week. 6 capsule 0  . fluticasone (FLONASE) 50 MCG/ACT nasal spray Place 1 spray into both nostrils daily. (Patient taking differently: Place 1 spray into both nostrils daily as needed for allergies. ) 16 g 2  . insulin NPH-regular Human (NOVOLIN 70/30) (70-30) 100 UNIT/ML injection Inject 25 Units into the skin 2 (two) times daily with a meal.     . levothyroxine (SYNTHROID, LEVOTHROID) 112 MCG tablet Take 112 mcg by mouth daily before breakfast.    . magnesium oxide (MAG-OX) 400 (241.3 Mg) MG tablet Take 1 tablet (400 mg total) by mouth 2 (two) times daily. 60 tablet 1  . Omega-3 Fatty Acids (FISH OIL) 1000 MG CAPS Take 1,000 mg by mouth daily.     . polyethylene glycol (MIRALAX /  GLYCOLAX) packet Take 17 g by mouth daily. 14 each 0  . predniSONE (DELTASONE) 20 MG tablet 1 tab every 12 hrs for 3 days, then once daily for 3 days 9 tablet 0  . propranolol (INDERAL) 10 MG tablet TAKE 1 TABLET BY MOUTH EVERY DAY 90 tablet 1  . TURMERIC PO Take 1 capsule by mouth daily.     . urea (CARMOL) 10 % cream Apply topically as needed. 71 g 0   No current facility-administered medications for this visit.     PHYSICAL EXAMINATION: ECOG PERFORMANCE STATUS: 1 - Symptomatic but completely ambulatory  Vitals:   04/01/18 1045 04/01/18 1048  BP: (!) 144/94 (!) 131/94  Pulse: 73   Resp: 18   Temp: 97.8 F (36.6 C)   SpO2: 100%    Filed Weights   04/01/18 1045  Weight: 160 lb 12.8 oz (72.9 kg)    GENERAL:alert, no distress and comfortable (+) wears a mask  SKIN: skin color, texture, turgor are normal, no rashes or significant lesions EYES: normal, Conjunctiva are pink and non-injected, sclera clear OROPHARYNX:no exudate, no erythema and lips, buccal mucosa, and tongue normal (+) cough NECK: supple, thyroid normal size, non-tender, without nodularity LYMPH:  no palpable lymphadenopathy in the cervical, axillary or inguinal LUNGS: clear to auscultation and percussion with normal breathing effort HEART: regular rate & rhythm and no murmurs and no lower extremity edema ABDOMEN:abdomen soft, non-tender and normal bowel sounds Musculoskeletal:no cyanosis of digits and no clubbing  NEURO: alert & oriented x 3 with fluent speech, no focal motor/sensory deficits  LABORATORY DATA:  I have reviewed the data as listed CBC Latest Ref Rng & Units 04/01/2018 03/25/2018 03/18/2018  WBC 4.0 - 10.5 K/uL 5.1 7.2 9.5  Hemoglobin 12.0 - 15.0 g/dL 13.1 14.1 13.4  Hematocrit 36.0 - 46.0 % 39.3 43.6 41.0  Platelets 150 - 400 K/uL 152 235 202     CMP Latest Ref Rng & Units 03/25/2018 03/18/2018 03/03/2018  Glucose 70 - 99 mg/dL 88 205(H) 191(H)  BUN 6 - 20 mg/dL 6 5(L) 6  Creatinine 0.44 - 1.00  mg/dL 0.72 0.69 0.76  Sodium 135 - 145 mmol/L 142 140 142  Potassium 3.5 - 5.1 mmol/L 3.5 3.2(L) 3.8  Chloride 98 - 111 mmol/L 105 105 107  CO2 22 - 32 mmol/L '28 27 23  '$ Calcium 8.9 - 10.3 mg/dL 7.3(L) 7.2(L) 7.0(L)  Total Protein 6.5 - 8.1 g/dL 7.6 6.9 7.0  Total Bilirubin 0.3 - 1.2 mg/dL 0.5 0.6 0.6  Alkaline Phos 38 - 126 U/L 164(H) 183(H) 171(H)  AST 15 - 41 U/L 22 16 22  ALT 0 - 44 U/L '16 13 17      '$ RADIOGRAPHIC STUDIES: I have personally reviewed the radiological images as listed and agreed with the findings in the report. No results found.   ASSESSMENT & PLAN:  Beverly Schwartz is a 48 y.o. female with history of  1. Sigmoid adenocarcinoma, with abdominopelvic adenopathy andhepatic metastasis, stage IV, MSI-stable , KRAS/NRAS/BRAF wild type  -Diagnosed in 05/2017. Treated with first-line chemotherapy Xeloda and vectibix. Patient declined intensive chemo regiments previously due to fear of side effects. Currently on Xeloda '1500mg'$  BID and Vectibix. -she previously developed skin rash and hand-foot syndrome with Xeloda, improved with dose reduction.  She is overall tolerating well. -Labs reviewed, CBC is WNLs. CMP, CEA, Mg and iron studies pending.  -She's been having a mild productive cough for a week now. She has seen her PCP who prescribed antibiotics and cough pills. She is fatigued, but overall improving.  -Will hold treatment this week due to bronchitis, and rescheduled to next week. -f/u in 3 weeks -repeat scan in 2-3 weeks for restaging.  2. Anemia, Iron deficiency -Labs reviewed, CBC showed Hg 13.1. Iron studies pending. -Resolved with iron supplement.  3. DM, HTN, HL -f/u with PCP  4. Financial and Social issues, Anxiety, depression -She follows up as needed with Social Worker Hollice Espy -She is currently on Xanax  -She has regained insurance lately, will start [employer] insurance in 2020  5.Goal of care discussion  -She understands the goal of care is  palliative, her cancer is incurable.  The goal of therapy is to prolong her life and improve her quality of life. -She is full code for now   6. Rash on face, chest, scalp and back, moderate -secondary to Panitumumab -She will continue using hydrocortisone cream and clindamycin gel twice daily  -improving  7. Hand-foot syndrome, secondary to Xeloda  -She developed mild hand-foot syndrome, secondary to Xeloda.   This has much improved with a dose reduction of Xeloda. -She will continue to use Urea cream   8. Hypomagnesemia, secondary to panitumumab -she has mild hypomagnesemia secondary to panitumumab, she is on oral magnesium BID. Continue. -I again reviewed importance of monitoring magnesium and staying compliant with magnesium pills -she says that magnesium pills giver her a headaches that are worse with IV magnesium.  Plan  -Hold Xeloda and vectibix this week due to bronchitis, scheduled for next week. -Patient is willing to get IV magnesium if level is low today -f/u in 3 weeks before treatment  -repeat CT CAP w contrast in 2-3 weeks  No problem-specific Assessment & Plan notes found for this encounter.   No orders of the defined types were placed in this encounter.  All questions were answered. The patient knows to call the clinic with any problems, questions or concerns. No barriers to learning was detected. I spent 20 minutes counseling the patient face to face. The total time spent in the appointment was 25 minutes and more than 50% was on counseling and review of test results  I, Noor Dweik am acting as scribe for Dr. Truitt Merle.  I have reviewed the above documentation for accuracy and completeness, and I agree with the above.     Truitt Merle, MD 04/01/2018

## 2018-03-29 NOTE — Progress Notes (Signed)
[facility] Progress Note   Beverly Schwartz [id number] September 29, 1970 17 y.Leanor Kail is managed by Dr. 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  Her magnesium level returned at 1.5.  She is asymptomatic.  She declines an IV infusion of magnesium.  She reports that she is taking 500 mg of magnesium twice daily.  It is not causing her to have diarrhea.  She will continue taking oral magnesium and will return for follow-up on 04/01/2018.  Malignant neoplasm of the rectosigmoid junction: The patient continues to be managed by Dr. Burr Medico.  She is currently treated with Xeloda and vectibix and is status post cycle 12 of vectibix which was dosed on 03/18/2018.  She completes days 1 through 7 of Xeloda today. She will return for follow-up on 04/01/2018.  Please see After Visit Summary for patient specific instructions.  Future Appointments  Date Time Provider [place]  04/01/2018 10:00 AM [facility] LAB 1 [facility] None  04/01/2018 10:30 AM Truitt Merle, MD [facility] None  04/01/2018 11:30 AM [facility] INFUSION [facility] None  04/15/2018  8:45 AM [facility] LAB 5 [facility] None  04/15/2018  9:30 AM [facility] INFUSION [facility] None    No orders of the defined types were placed in this encounter.      Subjective:   Patient ID:  Beverly Schwartz is a 48 y.o. (DOB 03-08-1971) female.  Chief Complaint:  Chief Complaint  Patient presents with  . Follow-up    HPI Beverly Schwartz is a 48 year old female with a metastatic sigmoid adenocarcinoma who is managed by Dr.  Dr. Burr Medico.  She is currently treated with Xeloda and vectibix and is status post cycle 12  of vectibix which was dosed on 03/18/2018.  She completes days 1 through 7 of Xeloda today.  She also has a history of hypomagnesemia and presents to the clinic today for follow-up of this.  She was offered and declined IV magnesium last week.  She reports that she is taking magnesium 500 mg twice daily and is attempting to increase her diet of magnesium rich foods.  Despite this her magnesium level returned today at 1.5.  She is asymptomatic.  She reports that she is tolerating oral magnesium well without any side effects of diarrhea.  She denies fevers, chills, sweats, headache, pain, constipation, diarrhea, nausea, or vomiting.  Medications: I have reviewed the patient's current medications.  Allergies:  Allergies  Allergen Reactions  . Bupropion Other (See Comments)    suicidal ideations  . Amoxicillin Rash    Has patient had a PCN reaction causing immediate rash, facial/tongue/throat swelling, SOB or lightheadedness with hypotension: Yes Has patient had a PCN reaction causing severe rash involving mucus membranes or skin necrosis: No Has patient had a PCN reaction that required hospitalization: No Has patient had a PCN reaction occurring within the last 10 years: No If all of the above answers are "NO", then may proceed with Cephalosporin use.     Past Medical History:  Diagnosis Date  . Anxiety   . Cancer ([place])    colon, liver  . Chest pain   . Colon cancer ([place])   . Complication of anesthesia   . Depression   . Diabetes mellitus   . Dizziness   .  Family history of breast cancer   . Family history of stomach cancer   . Hyperlipidemia   . Hypertension   . Hypothyroidism   . Obesity   . PONV (postoperative nausea and vomiting)   . Tachycardia     Past Surgical History:  Procedure Laterality Date  . FLEXIBLE SIGMOIDOSCOPY N/A 05/31/2017   Procedure: FLEXIBLE SIGMOIDOSCOPY;  Surgeon: Clarene Essex, MD;  Location: [facility];  Service: Endoscopy;  Laterality: N/A;  . WISDOM  TOOTH EXTRACTION     age 11's    Family History  Problem Relation Age of Onset  . Heart disease Father   . Healthy Mother   . Hyperlipidemia Sister   . Healthy Brother   . Heart disease Paternal Uncle   . Heart attack Paternal Uncle   . Healthy Son   . Breast cancer Maternal Grandmother        d. in mid 81s  . Stomach cancer Paternal Grandfather        d. 42  . Colon cancer Neg Hx   . Esophageal cancer Neg Hx   . Rectal cancer Neg Hx   . Liver cancer Neg Hx     Social History   Socioeconomic History  . Marital status: Divorced    Spouse name: Not on file  . Number of children: 1  . Years of education: Not on file  . Highest education level: Not on file  Occupational History  . Occupation: Producer, television/film/video  Social Needs  . Financial resource strain: Not on file  . Food insecurity:    Worry: Not on file    Inability: Not on file  . Transportation needs:    Medical: Not on file    Non-medical: Not on file  Tobacco Use  . Smoking status: Never Smoker  . Smokeless tobacco: Never Used  Substance and Sexual Activity  . Alcohol use: No  . Drug use: No  . Sexual activity: Never  Lifestyle  . Physical activity:    Days per week: Not on file    Minutes per session: Not on file  . Stress: Not on file  Relationships  . Social connections:    Talks on phone: Not on file    Gets together: Not on file    Attends religious service: Not on file    Active member of club or organization: Not on file    Attends meetings of clubs or organizations: Not on file    Relationship status: Not on file  . Intimate partner violence:    Fear of current or ex partner: Not on file    Emotionally abused: Not on file    Physically abused: Not on file    Forced sexual activity: Not on file  Other Topics Concern  . Not on file  Social History Narrative  . Not on file    Past Medical History, Surgical history, Social history, and Family history were reviewed and updated as  appropriate.   Please see review of systems for further details on the patient's review from today.   Review of Systems:  Review of Systems  Constitutional: Negative for chills, diaphoresis and fever.  HENT: Negative for trouble swallowing and voice change.   Respiratory: Negative for cough, chest tightness, shortness of breath and wheezing.   Cardiovascular: Negative for chest pain and palpitations.  Gastrointestinal: Negative for abdominal pain, constipation, diarrhea, nausea and vomiting.  Musculoskeletal: Negative for back pain and myalgias.  Neurological: Negative for dizziness, light-headedness and  headaches.    Objective:   Physical Exam:  BP 129/88 (BP Location: Left Arm, Patient Position: Sitting)   Pulse 79   Temp 98 F (36.7 C) (Oral)   Resp 18   Ht 5\' 5"  (1.651 m)   Wt 163 lb 12.8 oz (74.3 kg)   SpO2 95%   BMI 27.26 kg/m  ECOG: 0  Physical Exam Constitutional:      General: She is not in acute distress.    Appearance: She is not diaphoretic.  HENT:     Head: Normocephalic and atraumatic.  Cardiovascular:     Rate and Rhythm: Normal rate and regular rhythm.     Heart sounds: Normal heart sounds. No murmur. No friction rub. No gallop.   Pulmonary:     Effort: Pulmonary effort is normal. No respiratory distress.     Breath sounds: Normal breath sounds. No wheezing or rales.  Abdominal:     General: Bowel sounds are normal. There is no distension.     Tenderness: There is no abdominal tenderness. There is no guarding.  Skin:    General: Skin is warm and dry.     Findings: No erythema or rash.  Neurological:     Mental Status: She is alert.     Lab Review:     Component Value Date/Time   NA 142 03/25/2018 1104   NA 145 (H) 06/03/2017 1008   K 3.5 03/25/2018 1104   CL 105 03/25/2018 1104   CO2 28 03/25/2018 1104   GLUCOSE 88 03/25/2018 1104   BUN 6 03/25/2018 1104   BUN 10 06/03/2017 1008   CREATININE 0.72 03/25/2018 1104   CALCIUM 7.3 (L)  03/25/2018 1104   PROT 7.6 03/25/2018 1104   PROT 6.8 06/03/2017 1008   ALBUMIN 3.4 (L) 03/25/2018 1104   ALBUMIN 3.9 06/03/2017 1008   AST 22 03/25/2018 1104   ALT 16 03/25/2018 1104   ALKPHOS 164 (H) 03/25/2018 1104   BILITOT 0.5 03/25/2018 1104   GFRNONAA >60 03/25/2018 1104   GFRAA >60 03/25/2018 1104       Component Value Date/Time   WBC 7.2 03/25/2018 1104   WBC 10.2 06/11/2017 1208   RBC 4.74 03/25/2018 1104   HGB 14.1 03/25/2018 1104   HGB 8.7 (L) 06/03/2017 1008   HCT 43.6 03/25/2018 1104   HCT 28.8 (L) 06/03/2017 1008   PLT 235 03/25/2018 1104   PLT 323 06/03/2017 1008   MCV 92.0 03/25/2018 1104   MCV 71 (L) 06/03/2017 1008   MCH 29.7 03/25/2018 1104   MCHC 32.3 03/25/2018 1104   RDW 17.2 (H) 03/25/2018 1104   RDW 16.4 (H) 06/03/2017 1008   LYMPHSABS 2.0 03/25/2018 1104   LYMPHSABS 2.2 06/03/2017 1008   MONOABS 0.5 03/25/2018 1104   EOSABS 0.4 03/25/2018 1104   EOSABS 0.9 (H) 06/03/2017 1008   BASOSABS 0.1 03/25/2018 1104   BASOSABS 0.1 06/03/2017 1008   -------------------------------  Imaging from last 24 hours (if applicable):  Radiology interpretation: No results found.

## 2018-03-30 ENCOUNTER — Ambulatory Visit (INDEPENDENT_AMBULATORY_CARE_PROVIDER_SITE_OTHER): Payer: BLUE CROSS/BLUE SHIELD | Admitting: Adult Health

## 2018-03-30 ENCOUNTER — Encounter: Payer: Self-pay | Admitting: Adult Health

## 2018-03-30 ENCOUNTER — Telehealth: Payer: Self-pay | Admitting: *Deleted

## 2018-03-30 VITALS — BP 121/83 | HR 110 | Temp 99.2°F | Ht 65.0 in | Wt 161.4 lb

## 2018-03-30 DIAGNOSIS — J22 Unspecified acute lower respiratory infection: Secondary | ICD-10-CM | POA: Diagnosis not present

## 2018-03-30 DIAGNOSIS — R6889 Other general symptoms and signs: Secondary | ICD-10-CM | POA: Diagnosis not present

## 2018-03-30 LAB — POCT INFLUENZA A/B
Influenza A, POC: NEGATIVE
Influenza B, POC: NEGATIVE

## 2018-03-30 MED ORDER — PREDNISONE 20 MG PO TABS: ORAL_TABLET | ORAL | 0 refills | Status: DC

## 2018-03-30 MED ORDER — ALBUTEROL SULFATE HFA 108 (90 BASE) MCG/ACT IN AERS: 2.0000 | INHALATION_SPRAY | Freq: Four times a day (QID) | RESPIRATORY_TRACT | 0 refills | Status: AC | PRN

## 2018-03-30 MED ORDER — BENZONATATE 100 MG PO CAPS: 100.0000 mg | ORAL_CAPSULE | Freq: Three times a day (TID) | ORAL | 0 refills | Status: DC | PRN

## 2018-03-30 MED ORDER — AZITHROMYCIN 250 MG PO TABS: ORAL_TABLET | ORAL | 0 refills | Status: DC

## 2018-03-30 NOTE — Assessment & Plan Note (Signed)
Flu Test Negative Please take Azithromycin, Prednisone, ProAir, Tessalon as directed. Continue to push fluids and rest as often as possible. If symptoms persist after antibiotic completed, please call clinic.

## 2018-03-30 NOTE — Assessment & Plan Note (Signed)
Flu Test Negative

## 2018-03-30 NOTE — Progress Notes (Signed)
Subjective:    Patient ID: Beverly Schwartz, female    DOB: 26-Apr-1970, 48 y.o.   MRN: [id number]  HPI:  Ms. Droessler presents with productive cough (clear mucus), yellow nasal drainage, fatigue, chills, poor appetite, and low grade fever (highest 99.3 f)- sx's began >7 days ago She has been taking OTC DayQuil and Teassalon pearls, last dose od DayQuil >48 hrs ago, current temp 99.2 f oral She denies CP/dizziness/HA/palpitations/V/D She reports low grade nausea, that has also contributed to the poor appetite She continues to abstain from tobacco/vape/ETOH use She reports being around her sister last week who was acutely ill with similar sx's Mother at [facility] during [place]  Patient Care Team    Relationship Specialty Notifications Start End  Mina Marble D, NP PCP - General Family Medicine  07/07/16   Delrae Rend, MD Consulting Physician Endocrinology  07/07/16   Truitt Merle, [place] Physician Hematology  06/05/17   Alla Feeling, NP Nurse Practitioner Nurse Practitioner  06/05/17   Clarene Essex, MD Consulting Physician Gastroenterology  06/05/17     Patient Active Problem List   Diagnosis Date Noted  . Acute respiratory infection 03/30/2018  . Flu-like symptoms 03/30/2018  . Genetic testing 08/10/2017  . Family history of breast cancer   . Family history of stomach cancer   . Goals of care, counseling/discussion 06/15/2017  . 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 SIRS (systemic inflammatory response syndrome) ([place]) 05/30/2017  . Healthcare maintenance 12/10/2016  . Elevated LFTs 12/10/2016  . Generalized abdominal pain 09/30/2016  . Anxiety 09/30/2016  . Sinusitis 09/03/2016  . Diabetes mellitus without complication ([place]) 32/67/1245  . Hypothyroid 07/07/2016  . Hypertension 07/07/2016  . Hyperlipidemia 08/06/2010  . Tachycardia   . Chest pain   . Dizziness   . Obesity      Past Medical History:  Diagnosis Date  . Anxiety   . Cancer ([place])     colon, liver  . Chest pain   . Colon cancer ([place])   . Complication of anesthesia   . Depression   . Diabetes mellitus   . Dizziness   . Family history of breast cancer   . Family history of stomach cancer   . Hyperlipidemia   . Hypertension   . Hypothyroidism   . Obesity   . PONV (postoperative nausea and vomiting)   . Tachycardia      Past Surgical History:  Procedure Laterality Date  . FLEXIBLE SIGMOIDOSCOPY N/A 05/31/2017   Procedure: FLEXIBLE SIGMOIDOSCOPY;  Surgeon: Clarene Essex, MD;  [facility]: [facility];  Service: Endoscopy;  Laterality: N/A;  . WISDOM TOOTH EXTRACTION     age 52's     Family History  Problem Relation Age of Onset  . Heart disease Father   . Healthy Mother   . Hyperlipidemia Sister   . Healthy Brother   . Heart disease Paternal Uncle   . Heart attack Paternal Uncle   . Healthy Son   . Breast cancer Maternal Grandmother        d. in mid 64s  . Stomach cancer Paternal Grandfather        d. 38  . Colon cancer Neg Hx   . Esophageal cancer Neg Hx   . Rectal cancer Neg Hx   . Liver cancer Neg Hx      Social History   Substance and Sexual Activity  Drug Use No     Social History   Substance and Sexual Activity  Alcohol Use No     Social History   Tobacco Use  Smoking Status Never Smoker  Smokeless Tobacco Never Used     Outpatient Encounter Medications as of 03/30/2018  Medication Sig  . acetaminophen-codeine (TYLENOL #3) 300-30 MG tablet Take 1 tablet by mouth every 6 (six) hours as needed for moderate pain.  Marland Kitchen ALPRAZolam (XANAX) 1 MG tablet Take 1 tablet (1 mg total) by mouth at bedtime as needed for anxiety.  Marland Kitchen atorvastatin (LIPITOR) 10 MG tablet Take 1 tablet (10 mg total) by mouth daily.  . benzonatate (TESSALON) 100 MG capsule Take 1 capsule (100 mg total) by mouth 3 (three) times daily as needed for cough.  . capecitabine (XELODA) 500 MG tablet TAKE 4 TABLETS (2000MG ) IN AM & AND 3 TABS (1500MG ) IN PM, WITHIN 30MIN OF  FINISHING FOOD. TAKE ON DAYS 1-7 & 15-21 OF EACH 28 DAY CYCLE  . cetirizine (ZYRTEC ALLERGY) 10 MG tablet Take 1 tablet (10 mg total) by mouth daily.  Marland Kitchen CINNAMON PO Take 1 tablet by mouth daily.   . clindamycin (CLINDAGEL) 1 % gel Apply topically 2 (two) times daily.  . ergocalciferol (VITAMIN D2) 50000 units capsule Take 1 capsule (50,000 Units total) by mouth once a week.  . fluticasone (FLONASE) 50 MCG/ACT nasal spray Place 1 spray into both nostrils daily. (Patient taking differently: Place 1 spray into both nostrils daily as needed for allergies. )  . insulin NPH-regular Human (NOVOLIN 70/30) (70-30) 100 UNIT/ML injection Inject 25 Units into the skin 2 (two) times daily with a meal.   . levothyroxine (SYNTHROID, LEVOTHROID) 112 MCG tablet Take 112 mcg by mouth daily before breakfast.  . magnesium oxide (MAG-OX) 400 (241.3 Mg) MG tablet Take 1 tablet (400 mg total) by mouth 2 (two) times daily.  . Omega-3 Fatty Acids (FISH OIL) 1000 MG CAPS Take 1,000 mg by mouth daily.   . polyethylene glycol (MIRALAX / GLYCOLAX) packet Take 17 g by mouth daily.  . propranolol (INDERAL) 10 MG tablet TAKE 1 TABLET BY MOUTH EVERY DAY  . TURMERIC PO Take 1 capsule by mouth daily.   . urea (CARMOL) 10 % cream Apply topically as needed.  . [DISCONTINUED] benzonatate (TESSALON) 100 MG capsule Take 1 capsule (100 mg total) by mouth 3 (three) times daily as needed for cough.  Marland Kitchen albuterol (PROVENTIL HFA;VENTOLIN HFA) 108 (90 Base) MCG/ACT inhaler Inhale 2 puffs into the lungs every 6 (six) hours as needed for wheezing or shortness of breath.  Marland Kitchen azithromycin (ZITHROMAX) 250 MG tablet 2 tabs day one.  1 tab days two-five  . predniSONE (DELTASONE) 20 MG tablet 1 tab every 12 hrs for 3 days, then once daily for 3 days  . [DISCONTINUED] azithromycin (ZITHROMAX Z-PAK) 250 MG tablet Take as prescribed  . [DISCONTINUED] doxycycline (VIBRA-TABS) 100 MG tablet Take 1 tablet (100 mg total) by mouth 2 (two) times daily.  .  [DISCONTINUED] methylPREDNISolone (MEDROL DOSEPAK) 4 MG TBPK tablet Take as directed  . [DISCONTINUED] prochlorperazine (COMPAZINE) 10 MG tablet Take 1 tablet (10 mg total) by mouth every 6 (six) hours as needed (NAUSEA).   No facility-administered encounter medications on file as of 03/30/2018.     Allergies: Bupropion and Amoxicillin  Body mass index is 26.86 kg/m.  Blood pressure 121/83, pulse (!) 110, temperature 99.2 F (37.3 C), temperature source Oral, height 5\' 5"  (1.651 m), weight 161 lb 6.4 oz (73.2 kg), SpO2 96 %.     Review of Systems  Constitutional: Positive  for activity change, appetite change, chills, diaphoresis, fatigue and fever. Negative for unexpected weight change.  HENT: Positive for congestion, facial swelling, nosebleeds and sore throat. Negative for ear discharge, ear pain, postnasal drip, rhinorrhea, sinus pressure, sinus pain, trouble swallowing and voice change.   Eyes: Negative for visual disturbance.  Respiratory: Positive for cough, chest tightness, shortness of breath and wheezing. Negative for stridor.   Cardiovascular: Negative for chest pain, palpitations and leg swelling.  Gastrointestinal: Positive for nausea. Negative for abdominal distention, abdominal pain, constipation, diarrhea and vomiting.  Genitourinary: Negative for difficulty urinating, flank pain and hematuria.  Neurological: Negative for dizziness and headaches.  Hematological: Bruises/bleeds easily.       Nosebleeds r/t chemotherapy   Psychiatric/Behavioral: Positive for sleep disturbance.       Objective:   Physical Exam Vitals signs and nursing note reviewed.  Constitutional:      General: She is in acute distress.     Appearance: She is normal weight. She is ill-appearing. She is not diaphoretic.  HENT:     Head: Normocephalic and atraumatic.     Right Ear: Tympanic membrane, ear canal and external ear normal. There is no impacted cerumen.     Left Ear: Tympanic membrane,  ear canal and external ear normal. There is no impacted cerumen.     Nose: Congestion and rhinorrhea present.     Mouth/Throat:     Mouth: Mucous membranes are dry.     Dentition: Abnormal dentition.     Pharynx: Posterior oropharyngeal erythema present. No oropharyngeal exudate.     Tonsils: No tonsillar exudate or tonsillar abscesses. Swelling: 1+ on the right. 1+ on the left.  Eyes:     Extraocular Movements: Extraocular movements intact.     Conjunctiva/sclera: Conjunctivae normal.     Pupils: Pupils are equal, round, and reactive to light.  Neck:     Musculoskeletal: Normal range of motion.  Cardiovascular:     Rate and Rhythm: Tachycardia present.     Heart sounds: Normal heart sounds. No murmur. No friction rub. No gallop.   Pulmonary:     Effort: Pulmonary effort is normal. No respiratory distress.     Breath sounds: Normal breath sounds. No stridor. No wheezing, rhonchi or rales.  Chest:     Chest wall: No tenderness.  Abdominal:     General: Abdomen is flat. Bowel sounds are normal.  Lymphadenopathy:     Cervical: No cervical adenopathy.  Skin:    Capillary Refill: Capillary refill takes less than 2 seconds.  Neurological:     Mental Status: She is alert and oriented to person, place, and time.  Psychiatric:        Mood and Affect: Mood normal.        Behavior: Behavior normal.        Thought Content: Thought content normal.        Judgment: Judgment normal.       Assessment & Plan:   1. Flu-like symptoms   2. Acute respiratory infection     Acute respiratory infection Flu Test Negative Please take Azithromycin, Prednisone, ProAir, Tessalon as directed. Continue to push fluids and rest as often as possible. If symptoms persist after antibiotic completed, please call clinic.  Flu-like symptoms Flu Test Negative     FOLLOW-UP:  Return if symptoms worsen or fail to improve.

## 2018-03-30 NOTE — Telephone Encounter (Signed)
Patient states she went to PCP today. Has bronchitis and is starting on antibiotics. Wants to know if she should appt with

## 2018-03-30 NOTE — Telephone Encounter (Signed)
Attempted to call patient. If she is on xeloda this week, she can stop until she sees Dr Burr Medico. Is OK to come for treatment this week.   Mailbox full

## 2018-03-30 NOTE — Telephone Encounter (Signed)
Pt wants to know if she should keep appt with Dr Burr Medico on Thursday with lab and chemo

## 2018-03-30 NOTE — Patient Instructions (Addendum)

## 2018-04-01 ENCOUNTER — Inpatient Hospital Stay: Payer: BLUE CROSS/BLUE SHIELD

## 2018-04-01 ENCOUNTER — Telehealth: Payer: Self-pay | Admitting: Hematology

## 2018-04-01 ENCOUNTER — Encounter: Payer: Self-pay | Admitting: Hematology

## 2018-04-01 ENCOUNTER — Inpatient Hospital Stay (HOSPITAL_BASED_OUTPATIENT_CLINIC_OR_DEPARTMENT_OTHER): Payer: BLUE CROSS/BLUE SHIELD | Admitting: Hematology

## 2018-04-01 VITALS — BP 131/94 | HR 73 | Temp 97.8°F | Resp 18 | Ht 65.0 in | Wt 160.8 lb

## 2018-04-01 DIAGNOSIS — I10 Essential (primary) hypertension: Secondary | ICD-10-CM

## 2018-04-01 DIAGNOSIS — E119 Type 2 diabetes mellitus without complications: Secondary | ICD-10-CM

## 2018-04-01 DIAGNOSIS — C19 Malignant neoplasm of rectosigmoid junction: Secondary | ICD-10-CM

## 2018-04-01 DIAGNOSIS — C787 Secondary malignant neoplasm of liver and intrahepatic bile duct: Secondary | ICD-10-CM | POA: Diagnosis not present

## 2018-04-01 DIAGNOSIS — D509 Iron deficiency anemia, unspecified: Secondary | ICD-10-CM

## 2018-04-01 DIAGNOSIS — G620 Drug-induced polyneuropathy: Secondary | ICD-10-CM

## 2018-04-01 DIAGNOSIS — D500 Iron deficiency anemia secondary to blood loss (chronic): Secondary | ICD-10-CM

## 2018-04-01 DIAGNOSIS — Z5112 Encounter for antineoplastic immunotherapy: Secondary | ICD-10-CM | POA: Diagnosis not present

## 2018-04-01 DIAGNOSIS — C187 Malignant neoplasm of sigmoid colon: Secondary | ICD-10-CM

## 2018-04-01 DIAGNOSIS — Z79899 Other long term (current) drug therapy: Secondary | ICD-10-CM

## 2018-04-01 LAB — CMP (CANCER CENTER ONLY)
ALT: 14 U/L (ref 0–44)
AST: 19 U/L (ref 15–41)
Albumin: 3 g/dL — ABNORMAL LOW (ref 3.5–5.0)
Alkaline Phosphatase: 149 U/L — ABNORMAL HIGH (ref 38–126)
Anion gap: 10 (ref 5–15)
BUN: 8 mg/dL (ref 6–20)
CO2: 25 mmol/L (ref 22–32)
Calcium: 7.7 mg/dL — ABNORMAL LOW (ref 8.9–10.3)
Chloride: 106 mmol/L (ref 98–111)
Creatinine: 0.68 mg/dL (ref 0.44–1.00)
GFR, Est AFR Am: >60 mL/min (ref >60)
GFR, Estimated: >60 mL/min (ref >60)
Glucose, Bld: 213 mg/dL — ABNORMAL HIGH (ref 70–99)
Potassium: 3.6 mmol/L (ref 3.5–5.1)
Sodium: 141 mmol/L (ref 135–145)
Total Bilirubin: 0.5 mg/dL (ref 0.3–1.2)
Total Protein: 7 g/dL (ref 6.5–8.1)

## 2018-04-01 LAB — CBC WITH DIFFERENTIAL (CANCER CENTER ONLY)
Abs Immature Granulocytes: 0.01 10*3/uL (ref 0.00–0.07)
Basophils Absolute: 0 10*3/uL (ref 0.0–0.1)
Basophils Relative: 1 %
Eosinophils Absolute: 0.4 10*3/uL (ref 0.0–0.5)
Eosinophils Relative: 8 %
HCT: 39.3 % (ref 36.0–46.0)
Hemoglobin: 13.1 g/dL (ref 12.0–15.0)
Immature Granulocytes: 0 %
Lymphocytes Relative: 34 %
Lymphs Abs: 1.8 10*3/uL (ref 0.7–4.0)
MCH: 30.3 pg (ref 26.0–34.0)
MCHC: 33.3 g/dL (ref 30.0–36.0)
MCV: 90.8 fL (ref 80.0–100.0)
Monocytes Absolute: 0.3 10*3/uL (ref 0.1–1.0)
Monocytes Relative: 6 %
Neutro Abs: 2.6 10*3/uL (ref 1.7–7.7)
Neutrophils Relative %: 51 %
Platelet Count: 152 10*3/uL (ref 150–400)
RBC: 4.33 MIL/uL (ref 3.87–5.11)
RDW: 17.1 % — ABNORMAL HIGH (ref 11.5–15.5)
WBC Count: 5.1 10*3/uL (ref 4.0–10.5)
nRBC: 0 % (ref 0.0–0.2)

## 2018-04-01 LAB — MAGNESIUM: Magnesium: 1.5 mg/dL — ABNORMAL LOW (ref 1.7–2.4)

## 2018-04-01 LAB — FERRITIN: Ferritin: 37 ng/mL (ref 11–307)

## 2018-04-01 LAB — IRON AND TIBC
Iron: 55 ug/dL (ref 41–142)
Saturation Ratios: 15 % — ABNORMAL LOW (ref 21–57)
TIBC: 354 ug/dL (ref 236–444)
UIBC: 299 ug/dL (ref 120–384)

## 2018-04-01 LAB — CEA (IN HOUSE-CHCC): CEA (CHCC-In House): 21.27 ng/mL — ABNORMAL HIGH (ref 0.00–5.00)

## 2018-04-01 MED ORDER — MAGNESIUM SULFATE 2 GM/50ML IV SOLN
2.0000 g | Freq: Once | INTRAVENOUS | Status: AC
  Administered 2018-04-01: 2 g via INTRAVENOUS
  Filled 2018-04-01: qty 50

## 2018-04-01 MED ORDER — SODIUM CHLORIDE 0.9 % IV SOLN: Freq: Once | INTRAVENOUS | Status: DC

## 2018-04-01 MED ORDER — SODIUM CHLORIDE 0.9 % IV SOLN
Freq: Once | INTRAVENOUS | Status: AC
  Administered 2018-04-01: 12:00:00 via INTRAVENOUS
  Filled 2018-04-01: qty 250

## 2018-04-01 NOTE — Telephone Encounter (Signed)
Printed calendar and avs. °

## 2018-04-01 NOTE — Patient Instructions (Signed)
Hypomagnesemia  Hypomagnesemia is a condition in which the level of magnesium in the blood is low. Magnesium is a mineral that is found in many foods. It is used in many different processes in the body. Hypomagnesemia can affect every organ in the body. In severe cases, it can cause life-threatening problems.  What are the causes?  This condition may be caused by:   Not getting enough magnesium in your diet.   Malnutrition.   Problems with absorbing magnesium from the intestines.   Dehydration.   Alcohol abuse.   Vomiting.   Severe or chronic diarrhea.   Some medicines, including medicines that make you urinate more (diuretics).   Certain diseases, such as kidney disease, diabetes, celiac disease, and overactive thyroid.  What are the signs or symptoms?  Symptoms of this condition include:   Loss of appetite.   Nausea and vomiting.   Involuntary shaking or trembling of a body part (tremor).   Muscle weakness.   Tingling in the arms and legs.   Sudden tightening of muscles (muscle spasms).   Confusion.   Psychiatric issues, such as depression, irritability, or psychosis.   A feeling of fluttering of the heart.   Seizures.  These symptoms are more severe if magnesium levels drop suddenly.  How is this diagnosed?  This condition may be diagnosed based on:   Your symptoms and medical history.   A physical exam.   Blood and urine tests.  How is this treated?  Treatment depends on the cause and the severity of the condition. It may be treated with:   A magnesium supplement. This can be taken in pill form. If the condition is severe, magnesium is usually given through an IV.   Changes to your diet. You may be directed to eat foods that have a lot of magnesium, such as green leafy vegetables, peas, beans, and nuts.   Stopping any intake of alcohol.  Follow these instructions at home:          Make sure that your diet includes foods with magnesium. Foods that have a lot of magnesium in them  include:  ? Green leafy vegetables, such as spinach and broccoli.  ? Beans and peas.  ? Nuts and seeds, such as almonds and sunflower seeds.  ? Whole grains, such as whole grain bread and fortified cereals.   Take magnesium supplements if your health care provider tells you to do that. Take them as directed.   Take over-the-counter and prescription medicines only as told by your health care provider.   Have your magnesium levels monitored as told by your health care provider.   When you are active, drink fluids that contain electrolytes.   Avoid drinking alcohol.   Keep all follow-up visits as told by your health care provider. This is important.  Contact a health care provider if:   You get worse instead of better.   Your symptoms return.  Get help right away if you:   Develop severe muscle weakness.   Have trouble breathing.   Feel that your heart is racing.  Summary   Hypomagnesemia is a condition in which the level of magnesium in the blood is low.   Hypomagnesemia can affect every organ in the body.   Treatment may include eating more foods that contain magnesium, taking magnesium supplements, and not drinking alcohol.   Have your magnesium levels monitored as told by your health care provider.  This information is not intended to replace advice given   to you by your health care provider. Make sure you discuss any questions you have with your health care provider.  Document Released: 12/04/2004 Document Revised: 02/09/2017 Document Reviewed: 02/09/2017  Elsevier Interactive Patient Education  2019 [employer]

## 2018-04-02 ENCOUNTER — Telehealth: Payer: Self-pay | Admitting: Hematology

## 2018-04-02 ENCOUNTER — Encounter: Payer: Self-pay | Admitting: General Practice

## 2018-04-02 NOTE — Progress Notes (Signed)
[place] Spiritual Care Note  Missed Beverly Schwartz in infusion yesterday so followed up by phone today, providing pastoral listening and reflection as she processed anxiety about upcoming CT. Though she is nervous about results, she also sees the practicality of getting Dr Burr Medico access to as much info as possible in order to target her tx. Focusing on the practicality helps her manage her other feelings.  Following for support, but please page if immediate needs arise or circumstances change. Thank you!   [place], [place], [facility] Pager [phone] Voicemail [phone]

## 2018-04-02 NOTE — Addendum Note (Signed)
Addended by: Truitt Merle on: 04/02/2018 08:20 AM   Modules accepted: Orders

## 2018-04-02 NOTE — Telephone Encounter (Signed)
Called patient about needing a CT scan, patient already has contrast per 01/09.  I called [facility] also, and they stated they were going to call the patient to schedule the appointment.  Patient stated she will keep an eye out for that call.

## 2018-04-07 ENCOUNTER — Inpatient Hospital Stay: Payer: BLUE CROSS/BLUE SHIELD

## 2018-04-07 VITALS — BP 156/94 | HR 75 | Temp 98.6°F | Resp 18

## 2018-04-07 DIAGNOSIS — C187 Malignant neoplasm of sigmoid colon: Secondary | ICD-10-CM

## 2018-04-07 DIAGNOSIS — Z5112 Encounter for antineoplastic immunotherapy: Secondary | ICD-10-CM | POA: Diagnosis not present

## 2018-04-07 DIAGNOSIS — D500 Iron deficiency anemia secondary to blood loss (chronic): Secondary | ICD-10-CM

## 2018-04-07 DIAGNOSIS — C19 Malignant neoplasm of rectosigmoid junction: Secondary | ICD-10-CM

## 2018-04-07 LAB — CMP (CANCER CENTER ONLY)
ALT: 15 U/L (ref 0–44)
AST: 19 U/L (ref 15–41)
Albumin: 3.3 g/dL — ABNORMAL LOW (ref 3.5–5.0)
Alkaline Phosphatase: 148 U/L — ABNORMAL HIGH (ref 38–126)
Anion gap: 11 (ref 5–15)
BUN: 8 mg/dL (ref 6–20)
CO2: 27 mmol/L (ref 22–32)
Calcium: 7.3 mg/dL — ABNORMAL LOW (ref 8.9–10.3)
Chloride: 105 mmol/L (ref 98–111)
Creatinine: 0.78 mg/dL (ref 0.44–1.00)
GFR, Est AFR Am: >60 mL/min (ref >60)
GFR, Estimated: >60 mL/min (ref >60)
Glucose, Bld: 186 mg/dL — ABNORMAL HIGH (ref 70–99)
Potassium: 3.3 mmol/L — ABNORMAL LOW (ref 3.5–5.1)
Sodium: 143 mmol/L (ref 135–145)
Total Bilirubin: 0.6 mg/dL (ref 0.3–1.2)
Total Protein: 7 g/dL (ref 6.5–8.1)

## 2018-04-07 LAB — CBC WITH DIFFERENTIAL (CANCER CENTER ONLY)
Abs Immature Granulocytes: 0.03 10*3/uL (ref 0.00–0.07)
Basophils Absolute: 0 10*3/uL (ref 0.0–0.1)
Basophils Relative: 1 %
Eosinophils Absolute: 0.3 10*3/uL (ref 0.0–0.5)
Eosinophils Relative: 4 %
HCT: 39.7 % (ref 36.0–46.0)
Hemoglobin: 13.2 g/dL (ref 12.0–15.0)
Immature Granulocytes: 0 %
Lymphocytes Relative: 31 %
Lymphs Abs: 2.4 10*3/uL (ref 0.7–4.0)
MCH: 30.3 pg (ref 26.0–34.0)
MCHC: 33.2 g/dL (ref 30.0–36.0)
MCV: 91.1 fL (ref 80.0–100.0)
Monocytes Absolute: 0.5 10*3/uL (ref 0.1–1.0)
Monocytes Relative: 6 %
Neutro Abs: 4.4 10*3/uL (ref 1.7–7.7)
Neutrophils Relative %: 58 %
Platelet Count: 296 10*3/uL (ref 150–400)
RBC: 4.36 MIL/uL (ref 3.87–5.11)
RDW: 16.4 % — ABNORMAL HIGH (ref 11.5–15.5)
WBC Count: 7.7 10*3/uL (ref 4.0–10.5)
nRBC: 0 % (ref 0.0–0.2)

## 2018-04-07 LAB — MAGNESIUM: Magnesium: 1.5 mg/dL — ABNORMAL LOW (ref 1.7–2.4)

## 2018-04-07 MED ORDER — SODIUM CHLORIDE 0.9 % IV SOLN
6.0000 mg/kg | Freq: Once | INTRAVENOUS | Status: AC
  Administered 2018-04-07: 400 mg via INTRAVENOUS
  Filled 2018-04-07: qty 20

## 2018-04-07 MED ORDER — SODIUM CHLORIDE 0.9 % IV SOLN
Freq: Once | INTRAVENOUS | Status: AC
  Administered 2018-04-07: 16:00:00 via INTRAVENOUS
  Filled 2018-04-07: qty 250

## 2018-04-07 NOTE — Patient Instructions (Signed)
Panitumumab Solution for Injection  What is this medicine?  PANITUMUMAB (pan i TOOM ue mab) is a monoclonal antibody. It is used to treat colorectal cancer.  This medicine may be used for other purposes; ask your health care provider or pharmacist if you have questions.  COMMON BRAND NAME(S): Vectibix  What should I tell my health care provider before I take this medicine?  They need to know if you have any of these conditions:  -eye disease, vision problems  -low levels of calcium, magnesium, or potassium in the blood  -lung or breathing disease, like asthma  -skin conditions or sensitivity  -an unusual or allergic reaction to panitumumab, other medicines, foods, dyes, or preservatives  -pregnant or trying to get pregnant  -breast-feeding  How should I use this medicine?  This drug is given as an infusion into a vein. It is administered in a hospital or clinic by a specially trained health care professional.  Talk to your pediatrician regarding the use of this medicine in children. Special care may be needed.  Overdosage: If you think you have taken too much of this medicine contact a poison control center or emergency room at once.  NOTE: This medicine is only for you. Do not share this medicine with others.  What if I miss a dose?  It is important not to miss your dose. Call your doctor or health care professional if you are unable to keep an appointment.  What may interact with this medicine?  Do not take this medicine with any of the following medications:  -bevacizumab  This list may not describe all possible interactions. Give your health care provider a list of all the medicines, herbs, non-prescription drugs, or dietary supplements you use. Also tell them if you smoke, drink alcohol, or use illegal drugs. Some items may interact with your medicine.  What should I watch for while using this medicine?  Visit your doctor for checks on your progress. This drug may make you feel generally unwell. This is not  uncommon, as chemotherapy can affect healthy cells as well as cancer cells. Report any side effects. Continue your course of treatment even though you feel ill unless your doctor tells you to stop.  This medicine can make you more sensitive to the sun. Keep out of the sun while receiving this medicine and for 2 months after the last dose. If you cannot avoid being in the sun, wear protective clothing and use sunscreen. Do not use sun lamps or tanning beds/booths.  In some cases, you may be given additional medicines to help with side effects. Follow all directions for their use.  Call your doctor or health care professional for advice if you get a fever, chills or sore throat, or other symptoms of a cold or flu. Do not treat yourself. This drug decreases your body's ability to fight infections. Try to avoid being around people who are sick.  Avoid taking products that contain aspirin, acetaminophen, ibuprofen, naproxen, or ketoprofen unless instructed by your doctor. These medicines may hide a fever.  Do not become pregnant while taking this medicine and for 2 months after the last dose. Women should inform their doctor if they wish to become pregnant or think they might be pregnant. There is a potential for serious side effects to an unborn child. Talk to your health care professional or pharmacist for more information. Do not breast-feed an infant while taking this medicine or for 2 months after the last dose.  What   side effects may I notice from receiving this medicine?  Side effects that you should report to your doctor or health care professional as soon as possible:  -allergic reactions like skin rash, itching or hives, swelling of the face, lips, or tongue  -breathing problems  -changes in vision  -eye pain  -fast, irregular heartbeat  -fever, chills  -mouth sores  -red spots on the skin  -redness, blistering, peeling or loosening of the skin, including inside the mouth  -signs and symptoms of kidney injury  like trouble passing urine or change in the amount of urine  -signs and symptoms of low blood pressure like dizziness; feeling faint or lightheaded, falls; unusually weak or tired  -signs of low calcium like fast heartbeat, muscle cramps or muscle pain; pain, tingling, numbness in the hands or feet; seizures  -signs and symptoms of low magnesium like muscle cramps, pain, or weakness; tremors; seizures; or fast, irregular heartbeat  -signs and symptoms of low potassium like muscle cramps or muscle pain; chest pain; dizziness; feeling faint or lightheaded, falls; palpitations; breathing problems; or fast, irregular heartbeat  -swelling of the ankles, feet, hands  Side effects that usually do not require medical attention (report to your doctor or health care professional if they continue or are bothersome):  -changes in skin like acne, cracks, skin dryness  -diarrhea  -eyelash growth  -headache  -mouth sores  -nail changes  -nausea, vomiting  This list may not describe all possible side effects. Call your doctor for medical advice about side effects. You may report side effects to FDA at 1-800-FDA-1088.  Where should I keep my medicine?  This drug is given in a hospital or clinic and will not be stored at home.  NOTE: This sheet is a summary. It may not cover all possible information. If you have questions about this medicine, talk to your doctor, pharmacist, or health care provider.   2019 Elsevier/Gold Standard (2015-09-28 16:45:04)

## 2018-04-07 NOTE — Progress Notes (Signed)
Okay to treat-Per Dr Burr Medico it is okay to give panitumumab while Mag results pending.

## 2018-04-08 ENCOUNTER — Encounter: Payer: Self-pay | Admitting: General Practice

## 2018-04-08 NOTE — Progress Notes (Signed)
[facility] Spiritual Care Note  Followed up with Beverly Schwartz in infusion, visiting in part alongside [employer], to provide pastoral presence, reflective listening, and help in identifying goals and ways to meet them. Beverly Schwartz is becoming aware of her deferred grief regarding big changes in custodial arrangements and employment status; now that she is further into her cancer experience, she is realizing how much of the dx/tx/vulnerability/trauma and grief issues she hasn't had a chance to process yet.  Anne/LCSW did a stellar job walking Beverly Schwartz through naming changes in her identity (from single mom to having her son only every other weekend, from well person to feeling exhausted from cancer and tx, from person with baseline depression to struggling with vulnerability/illness/grief/social isolation). Together we talked through Beverly Schwartz's past interests and what appeals to her now, as well as ways that [facility] and other community resources might help meet those needs.  Webb Silversmith helped Beverly Schwartz identify two goals for the coming fortnight, one social (try out one [place] program) and one introverted (call to for information about 2 free [facility] massages). I encouraged Beverly Schwartz to contact either of [place] to "brag" (= have an encouraging witness) as she meets these goals and to process her experience.  Support Team is following for emotional support, goal setting, processing/reflection, and short-term counseling as appropriate. Beverly Schwartz utilizes Team well and knows to contact us as needs/questions arise. Please also page/call if circumstances change. Thank you!   [place], [place], [facility] Pager [phone] Voicemail [phone]

## 2018-04-15 ENCOUNTER — Other Ambulatory Visit: Payer: Self-pay

## 2018-04-15 ENCOUNTER — Ambulatory Visit: Payer: Self-pay

## 2018-04-19 NOTE — Progress Notes (Signed)
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 Abdominopelvic adenopathy. Rectosigmoid mass with suggestion of partial obstruction as evidenced by large colonic stool burden more proximally. 2.  Aortic Atherosclerosis (ICD10-I70.0).  This is age advanced.    05/31/2017 Tumor Marker    CEA 353.3 (elevated) AFP 4.5 (normal)    05/31/2017 Initial Biopsy    Diagnosis Colon, biopsy, sigmoid - INVASIVE ADENOCARCINOMA - SEE COMMENT    05/31/2017 Imaging    CT CHEST IMPRESSION: 1. No evidence of metastatic disease in the chest. 2. No acute findings.  Aortic Atherosclerosis (ICD10-I70.0).     05/31/2017 Procedure    Colonoscopy per Dr. Watt Climes Findings: An infiltrative and ulcerated partially obstructing medium-sized mass was found in the recto-sigmoid colon. The mass was circumferential. The mass measured four cm in length. No bleeding was present.   Impression - One small polyp in the rectum. - The examination was otherwise normal. - Malignant partially obstructing tumor in  the recto-sigmoid colon. Biopsied. - One medium polyp in the proximal sigmoid colon. - The examination was otherwise normal. - Internal hemorrhoids.    06/03/2017 Initial Diagnosis    Malignant neoplasm of transverse colon ([place])    06/11/2017 Cancer Staging    Staging form: Colon and Rectum, AJCC 8th Edition - Clinical stage from 06/11/2017: Stage IVA (cTX, cN1b, pM1a) - Signed by Truitt Merle, MD on 06/15/2017    06/11/2017 Pathology Results    Liver biopsy confirmed metastatic colon cancer    07/23/2017 Imaging    CT CAP IMPRESSION: 1. Mild progression of hepatic metastasis. 2. Similar rectosigmoid primary with abdominopelvic nodal metastasis. 3.  No acute process or evidence of metastatic disease in the chest. 4. Aortic Atherosclerosis (ICD10-I70.0). Coronary artery atherosclerosis. This is age advanced. 5. Proximal colonic constipation, again suggesting a component of partial obstruction at the primary site. 6. Mild ascending aortic dilatation at 4.1 cm.    08/25/2017 -  Chemotherapy    -Xeloda '1500mg'$  BID 1 week on and 1 week off starting 08/25/17. Due to her worsening hand-foot syndrome her dose was reduced to '1500mg'$  in the AM and '1000mg'$  in the PM. -Vectibix every 2 weeks starting 08/25/17    12/14/2017 Imaging    IMPRESSION: 1. Response to therapy. Marked decrease in hepatic metastatic burden. More mild decrease in abdominopelvic adenopathy and definition of rectosigmoid primary. 2.  No acute process or evidence of metastatic disease in the chest. 3.  Aortic Atherosclerosis (ICD10-I70.0).       CURRENT THERAPY:  -Xeloda '1500mg'$  BID 1 week on and 1 week off -Vectibix every 2 weeks starting 08/25/17  INTERVAL HISTORY:  Beverly Schwartz is here for a  follow up and ongoing treatment. She presents to the clinic today with her family member. She noes she has intermittent constipation and has been taking miralax for this and constipation improved. She notes she takes oral magnesium daily.  She notes having dry nose with mild bleeding. She also notes itchy eye which is not as bothersome as before. She notes her appetite is adequate and her energy is stable. She is tired but mainly able to still complete activities. We reviewed her medication list. She wants a refill of Xanax.  She notes her glucose was 150 preprandial this morning. Her family member notes her DM has never been completely controlled. She notes her cough is resolving after antibiotics.   She has been taking Xeloda '1500mg'$  in the AM and '1000mg'$  in PM.    REVIEW OF SYSTEMS:   Constitutional: Denies fevers, chills or abnormal weight loss (+) stable energy, adequate appetite (+) tired  Eyes: Denies blurriness of vision Ears, nose, mouth, throat, and face: Denies mucositis or sore throat (+) itchy eyes (+) dry nose with mild bleeding.  Respiratory: Denies cough, dyspnea or wheezes Cardiovascular: Denies palpitation, chest discomfort or lower extremity swelling Gastrointestinal:  Denies nausea, heartburn (+) intermittent constipation improved Skin: Denies abnormal skin rashes Lymphatics: Denies new lymphadenopathy or easy bruising Neurological:Denies numbness, tingling or new weaknesses Behavioral/Psych: Mood is stable, no new changes  All other systems were reviewed with the patient and are negative.  MEDICAL HISTORY:  Past Medical History:  Diagnosis Date  . Anxiety   . Cancer ([place])    colon, liver  . Chest pain   . Colon cancer ([place])   . Complication of anesthesia   . Depression   . Diabetes mellitus   . Dizziness   . Family history of breast cancer   . Family history of stomach cancer   . Hyperlipidemia   . Hypertension   . Hypothyroidism   . Obesity   . PONV (postoperative nausea and vomiting)   . Tachycardia     SURGICAL HISTORY: Past Surgical History:  Procedure Laterality Date  . FLEXIBLE SIGMOIDOSCOPY N/A 05/31/2017   Procedure: FLEXIBLE SIGMOIDOSCOPY;  Surgeon: Clarene Essex, MD;  Location: [facility]  ENDOSCOPY;  Service: Endoscopy;  Laterality: N/A;  . WISDOM TOOTH EXTRACTION     age 48's    I have reviewed the social history and family history with the patient and they are unchanged from previous note.  ALLERGIES:  is allergic to bupropion and amoxicillin.  MEDICATIONS:  Current Outpatient Medications  Medication Sig Dispense Refill  . acetaminophen-codeine (TYLENOL #3) 300-30 MG tablet Take 1 tablet by mouth every 6 (six) hours as needed for moderate pain. 30 tablet 0  . albuterol (PROVENTIL HFA;VENTOLIN HFA) 108 (90 Base) MCG/ACT inhaler Inhale 2 puffs into the lungs every 6 (six) hours as needed for wheezing or shortness of breath. 1 Inhaler 0  . ALPRAZolam (XANAX) 1 MG tablet Take 1 tablet (1 mg total) by mouth at bedtime as needed for anxiety. 30 tablet 0  . atorvastatin (LIPITOR) 10 MG tablet Take 1 tablet (10 mg total) by mouth daily. 90 tablet 3  . azithromycin (ZITHROMAX) 250 MG tablet 2 tabs day one.  1 tab days two-five 6 tablet 0  . benzonatate (TESSALON) 100 MG capsule Take 1 capsule (100 mg total) by mouth 3 (three) times daily as needed for cough. 20 capsule 0  . capecitabine (XELODA) 500 MG tablet TAKE 4 TABLETS ('2000MG'$ ) IN AM & AND 3 TABS ('1500MG'$ ) IN PM,  WITHIN 30MIN OF FINISHING FOOD. TAKE ON DAYS 1-7 & 15-21 OF EACH 28 DAY CYCLE 294 tablet 0  . cetirizine (ZYRTEC ALLERGY) 10 MG tablet Take 1 tablet (10 mg total) by mouth daily. 30 tablet 1  . CINNAMON PO Take 1 tablet by mouth daily.     . clindamycin (CLINDAGEL) 1 % gel Apply topically 2 (two) times daily. 60 g 1  . ergocalciferol (VITAMIN D2) 50000 units capsule Take 1 capsule (50,000 Units total) by mouth once a week. 6 capsule 0  . fluticasone (FLONASE) 50 MCG/ACT nasal spray Place 1 spray into both nostrils daily. (Patient taking differently: Place 1 spray into both nostrils daily as needed for allergies. ) 16 g 2  . insulin NPH-regular Human (NOVOLIN 70/30) (70-30) 100 UNIT/ML injection Inject 25 Units into the  skin 2 (two) times daily with a meal.     . levothyroxine (SYNTHROID, LEVOTHROID) 112 MCG tablet Take 112 mcg by mouth daily before breakfast.    . magnesium oxide (MAG-OX) 400 (241.3 Mg) MG tablet Take 1 tablet (400 mg total) by mouth 2 (two) times daily. 60 tablet 1  . Omega-3 Fatty Acids (FISH OIL) 1000 MG CAPS Take 1,000 mg by mouth daily.     . polyethylene glycol (MIRALAX / GLYCOLAX) packet Take 17 g by mouth daily. 14 each 0  . predniSONE (DELTASONE) 20 MG tablet 1 tab every 12 hrs for 3 days, then once daily for 3 days 9 tablet 0  . propranolol (INDERAL) 10 MG tablet TAKE 1 TABLET BY MOUTH EVERY DAY 90 tablet 1  . TURMERIC PO Take 1 capsule by mouth daily.     . urea (CARMOL) 10 % cream Apply topically as needed. 71 g 0   No current facility-administered medications for this visit.    Facility-Administered Medications Ordered in Other Visits  Medication Dose Route Frequency Provider Last Rate Last Dose  . panitumumab (VECTIBIX) 400 mg in sodium chloride 0.9 % 100 mL chemo infusion  6 mg/kg (Treatment Plan Recorded) Intravenous Once Truitt Merle, MD        PHYSICAL EXAMINATION: ECOG PERFORMANCE STATUS: 1 - Symptomatic but completely ambulatory  Vitals:   04/21/18 1442 04/21/18 1500  BP: (!) 141/107 (!) 138/95  Pulse: 79   Resp: 18   Temp: 98.6 F (37 C)   SpO2: 100%    Filed Weights   04/21/18 1442  Weight: 160 lb 12.8 oz (72.9 kg)    GENERAL:alert, no distress and comfortable SKIN: skin color, texture, turgor are normal, (+) dry skin, especially in her face, around nose, and palms, (+) mild diffuse skin redness on her face, with scattered skin rashes. EYES: normal, Conjunctiva are pink and non-injected, sclera clear OROPHARYNX:no exudate, no erythema and lips, buccal mucosa, and tongue normal  NECK: supple, thyroid normal size, non-tender, without nodularity LYMPH:  no palpable lymphadenopathy in the cervical, axillary or inguinal LUNGS: clear to auscultation and  percussion with normal breathing effort HEART: regular rate & rhythm and no murmurs and no lower extremity edema ABDOMEN:abdomen soft, non-tender and normal bowel sounds (+) mild RUQ tenderness  Musculoskeletal:no cyanosis of digits and no clubbing  NEURO: alert & oriented x 3 with fluent speech, no focal motor/sensory deficits  LABORATORY DATA:  I have reviewed the data as listed CBC Latest Ref Rng & Units 04/21/2018 04/07/2018 04/01/2018  WBC 4.0 - 10.5 K/uL 9.5 7.7 5.1  Hemoglobin 12.0 - 15.0 g/dL 13.9 13.2 13.1  Hematocrit 36.0 - 46.0 % 41.3 39.7  39.3  Platelets 150 - 400 K/uL 198 296 152     CMP Latest Ref Rng & Units 04/21/2018 04/07/2018 04/01/2018  Glucose 70 - 99 mg/dL 161(H) 186(H) 213(H)  BUN 6 - 20 mg/dL '8 8 8  '$ Creatinine 0.44 - 1.00 mg/dL 0.67 0.78 0.68  Sodium 135 - 145 mmol/L 141 143 141  Potassium 3.5 - 5.1 mmol/L 3.6 3.3(L) 3.6  Chloride 98 - 111 mmol/L 106 105 106  CO2 22 - 32 mmol/L '24 27 25  '$ Calcium 8.9 - 10.3 mg/dL 7.0(L) 7.3(L) 7.7(L)  Total Protein 6.5 - 8.1 g/dL 7.3 7.0 7.0  Total Bilirubin 0.3 - 1.2 mg/dL 0.6 0.6 0.5  Alkaline Phos 38 - 126 U/L 160(H) 148(H) 149(H)  AST 15 - 41 U/L '17 19 19  '$ ALT 0 - 44 U/L '12 15 14      '$ RADIOGRAPHIC STUDIES: I have personally reviewed the radiological images as listed and agreed with the findings in the report. No results found.   ASSESSMENT & PLAN:  Beverly Schwartz is a 48 y.o. female with   1. Sigmoid adenocarcinoma, with abdominopelvic adenopathy andhepatic metastasis, stage IV, MSI-stable , KRAS/NRAS/BRAF wild type -Diagnosed in 05/2017. Treated with first-line chemotherapy Xeloda and Vectibix. Patient declined intensive chemo regiments previously due to fear of side effects. Currently on Xeloda '1500mg'$  BID and Vectibix. -she previously developed skin rash and hand-foot syndrome with Xeloda, improved with dose reduction.  She is overall tolerating well. -For her dry nose, I recommend saline nasal spray and Vaseline.  I advised her not to pick at her nose as she has mild bleeding.  -Labs reviewed, CBC overall WNL, CMP, Mag normal, CMP unremarkable except hypocalcemia. Adequate for Vectibix today.  -Her restaging CT CAP is scheduled for 04/28/18  -Her Bronchitis cough is resolving after antibiotics.  -Will continue Xeloda, increase to '1500mg'$  BID, 1 week on, and one-week off -F/u in 2 weeks   2. Anemia, Iron deficiency -Resolved with iron supplement.   3. DM, HTN, HL -f/u with PCP  4. Financial and Social issues, Anxiety, depression -Shefollows up as needed withSocial Worker Hollice Espy -She is currently on Xanax  -Shehas regained insurance with [employer] this year  5.Goal of care discussion  -She understands the goal of care is palliative, her cancer is incurable. The goal of therapy is to prolong her life and improve her quality of life. -She is full code for now   6. Rash on face, chest, scalp and back, moderate -secondary to Panitumumab -She will continue using hydrocortisone cream and clindamycin gel twice daily  -Her rash is overall improving   7. Hand-foot syndrome, secondary to Xeloda  -She developed mild hand-foot syndrome, secondary to Xeloda.This has much improved with a dose reduction of Xeloda. -She will continue to use Urea cream  8. Hypomagnesemia, secondary to panitumumab  -she has mild hypomagnesemia secondary to panitumumab, she is on oral magnesiumBID. Continue. -she says that magnesium pills giver her a headaches that are worse with IV magnesium so she has not been very compliant with them.  -She notes being more compliant with oral mag. Mag level 1.7 today, normal, no need iv mag   9.  Hypocalcemia -I encouraged her to start taking over-the-counter calcium tablets to once daily  Plan  -Labs reviewed and adequate to proceed with Vectibix today  -continue Xeloda '1500mg'$  BID, one week on and one week off  -Lab, f/u and Vectibix in 2 weeks   -restaging scan next week, will  call her with the results    No problem-specific Assessment & Plan notes found for this encounter.   No orders of the defined types were placed in this encounter.  All questions were answered. The patient knows to call the clinic with any problems, questions or concerns. No barriers to learning was detected. I spent 20 minutes counseling the patient face to face. The total time spent in the appointment was 25 minutes and more than 50% was on counseling and review of test results     Truitt Merle, MD 04/21/2018   I, Joslyn Devon, am acting as scribe for Truitt Merle, MD.   I have reviewed the above documentation for accuracy and completeness, and I agree with the above.

## 2018-04-21 ENCOUNTER — Inpatient Hospital Stay: Payer: BLUE CROSS/BLUE SHIELD

## 2018-04-21 ENCOUNTER — Telehealth: Payer: Self-pay | Admitting: Hematology

## 2018-04-21 ENCOUNTER — Encounter: Payer: Self-pay | Admitting: Hematology

## 2018-04-21 ENCOUNTER — Inpatient Hospital Stay (HOSPITAL_BASED_OUTPATIENT_CLINIC_OR_DEPARTMENT_OTHER): Payer: BLUE CROSS/BLUE SHIELD | Admitting: Hematology

## 2018-04-21 ENCOUNTER — Inpatient Hospital Stay: Payer: BLUE CROSS/BLUE SHIELD | Admitting: General Practice

## 2018-04-21 VITALS — BP 150/95 | HR 72 | Resp 17

## 2018-04-21 DIAGNOSIS — C19 Malignant neoplasm of rectosigmoid junction: Secondary | ICD-10-CM

## 2018-04-21 DIAGNOSIS — D509 Iron deficiency anemia, unspecified: Secondary | ICD-10-CM

## 2018-04-21 DIAGNOSIS — D500 Iron deficiency anemia secondary to blood loss (chronic): Secondary | ICD-10-CM

## 2018-04-21 DIAGNOSIS — Z5112 Encounter for antineoplastic immunotherapy: Secondary | ICD-10-CM | POA: Diagnosis not present

## 2018-04-21 DIAGNOSIS — C787 Secondary malignant neoplasm of liver and intrahepatic bile duct: Secondary | ICD-10-CM | POA: Diagnosis not present

## 2018-04-21 DIAGNOSIS — E119 Type 2 diabetes mellitus without complications: Secondary | ICD-10-CM

## 2018-04-21 DIAGNOSIS — F419 Anxiety disorder, unspecified: Secondary | ICD-10-CM

## 2018-04-21 DIAGNOSIS — C187 Malignant neoplasm of sigmoid colon: Secondary | ICD-10-CM

## 2018-04-21 DIAGNOSIS — I10 Essential (primary) hypertension: Secondary | ICD-10-CM

## 2018-04-21 DIAGNOSIS — G620 Drug-induced polyneuropathy: Secondary | ICD-10-CM

## 2018-04-21 LAB — CMP (CANCER CENTER ONLY)
ALT: 12 U/L (ref 0–44)
AST: 17 U/L (ref 15–41)
Albumin: 3.3 g/dL — ABNORMAL LOW (ref 3.5–5.0)
Alkaline Phosphatase: 160 U/L — ABNORMAL HIGH (ref 38–126)
Anion gap: 11 (ref 5–15)
BUN: 8 mg/dL (ref 6–20)
CO2: 24 mmol/L (ref 22–32)
Calcium: 7 mg/dL — ABNORMAL LOW (ref 8.9–10.3)
Chloride: 106 mmol/L (ref 98–111)
Creatinine: 0.67 mg/dL (ref 0.44–1.00)
GFR, Est AFR Am: >60 mL/min (ref >60)
GFR, Estimated: >60 mL/min (ref >60)
Glucose, Bld: 161 mg/dL — ABNORMAL HIGH (ref 70–99)
Potassium: 3.6 mmol/L (ref 3.5–5.1)
Sodium: 141 mmol/L (ref 135–145)
Total Bilirubin: 0.6 mg/dL (ref 0.3–1.2)
Total Protein: 7.3 g/dL (ref 6.5–8.1)

## 2018-04-21 LAB — CBC WITH DIFFERENTIAL (CANCER CENTER ONLY)
Abs Immature Granulocytes: 0.02 10*3/uL (ref 0.00–0.07)
Basophils Absolute: 0.1 10*3/uL (ref 0.0–0.1)
Basophils Relative: 1 %
Eosinophils Absolute: 0.5 10*3/uL (ref 0.0–0.5)
Eosinophils Relative: 5 %
HCT: 41.3 % (ref 36.0–46.0)
Hemoglobin: 13.9 g/dL (ref 12.0–15.0)
Immature Granulocytes: 0 %
Lymphocytes Relative: 25 %
Lymphs Abs: 2.4 10*3/uL (ref 0.7–4.0)
MCH: 30.5 pg (ref 26.0–34.0)
MCHC: 33.7 g/dL (ref 30.0–36.0)
MCV: 90.6 fL (ref 80.0–100.0)
Monocytes Absolute: 0.6 10*3/uL (ref 0.1–1.0)
Monocytes Relative: 6 %
Neutro Abs: 6 10*3/uL (ref 1.7–7.7)
Neutrophils Relative %: 63 %
Platelet Count: 198 10*3/uL (ref 150–400)
RBC: 4.56 MIL/uL (ref 3.87–5.11)
RDW: 16.9 % — ABNORMAL HIGH (ref 11.5–15.5)
WBC Count: 9.5 10*3/uL (ref 4.0–10.5)
nRBC: 0 % (ref 0.0–0.2)

## 2018-04-21 LAB — CEA (IN HOUSE-CHCC): CEA (CHCC-In House): 26.3 ng/mL — ABNORMAL HIGH (ref 0.00–5.00)

## 2018-04-21 LAB — MAGNESIUM: Magnesium: 1.7 mg/dL (ref 1.7–2.4)

## 2018-04-21 MED ORDER — SODIUM CHLORIDE 0.9 % IV SOLN
Freq: Once | INTRAVENOUS | Status: AC
  Administered 2018-04-21: 16:00:00 via INTRAVENOUS
  Filled 2018-04-21: qty 250

## 2018-04-21 MED ORDER — ALPRAZOLAM 1 MG PO TABS: 1.0000 mg | ORAL_TABLET | Freq: Every evening | ORAL | 0 refills | Status: DC | PRN

## 2018-04-21 MED ORDER — SODIUM CHLORIDE 0.9 % IV SOLN
6.0000 mg/kg | Freq: Once | INTRAVENOUS | Status: AC
  Administered 2018-04-21: 400 mg via INTRAVENOUS
  Filled 2018-04-21: qty 20

## 2018-04-21 NOTE — Progress Notes (Signed)
Right at the end of pt's Vectibix infusion, she began c/o her heart fluttering. Denied any chest pain or shortness of breath. Vitals cycled and WNL for patient. Alfredia Client came to chairside to assess. Listened to pt's heart and reported hearing normal sounds and rhythm. Pt able to finish remainder of infusion, IV removed intact, and ambulated out of clinic without incident.   Vitals:   04/21/18 1741  BP: (!) 150/95  Pulse: 72  Resp: 17  SpO2: 100%

## 2018-04-21 NOTE — Patient Instructions (Signed)
[facility] Discharge Instructions for Patients Receiving Chemotherapy  Today you received the following chemotherapy agents Panitumumab (VECTIBIX).  To help prevent nausea and vomiting after your treatment, we encourage you to take your nausea medication as prescribed.   If you develop nausea and vomiting that is not controlled by your nausea medication, call the clinic.   BELOW ARE SYMPTOMS THAT SHOULD BE REPORTED IMMEDIATELY:  *FEVER GREATER THAN 100.5 F  *CHILLS WITH OR WITHOUT FEVER  NAUSEA AND VOMITING THAT IS NOT CONTROLLED WITH YOUR NAUSEA MEDICATION  *UNUSUAL SHORTNESS OF BREATH  *UNUSUAL BRUISING OR BLEEDING  TENDERNESS IN MOUTH AND THROAT WITH OR WITHOUT PRESENCE OF ULCERS  *URINARY PROBLEMS  *BOWEL PROBLEMS  UNUSUAL RASH Items with * indicate a potential emergency and should be followed up as soon as possible.  Feel free to call the clinic should you have any questions or concerns. The clinic phone number is [phone].  Please show the CHEMO ALERT CARD at check-in to the Emergency Department and triage nurse.   

## 2018-04-21 NOTE — Progress Notes (Signed)
[facility] CSW Progress Note  Met w patient in infusion room to assess progress towards goal of increasing activity and motivation during cancer treatment.  Patient reports her stomach pain has limited her ability to explore participation in [employer] as she had hoped.  Did visit local store w friend, but reports that otherwise she is severely limited by fatigue and lack of motivation.  Reports anxiety over upcoming scan "it will show me whether the cancer has progressed or whether the treatment is working."  Discussed ways to implement small goals on a daily basis in order to work towards larger goal of decreasing financial and environmental stress.  Discussed difficulties of trying to make life plans/goals in context of uncertainty surrounding disease process/progression.  Developed small goal of working on small portion of single room in her current home as a way to begin to make progress towards eventual goal of downsizing.  Will see patient at next infusion to check in and evaluate progress.    Beverely Pace, [place] Worker [facility]

## 2018-04-21 NOTE — Telephone Encounter (Signed)
Scheduled appt per 01/29 los.  Patient aware of appt dates and time

## 2018-04-28 ENCOUNTER — Ambulatory Visit (HOSPITAL_COMMUNITY)
Admission: RE | Admit: 2018-04-28 | Discharge: 2018-04-28 | Disposition: A | Discharge status: Home / Self Care | Payer: BLUE CROSS/BLUE SHIELD | Source: Ambulatory Visit | Attending: Hematology | Admitting: Hematology

## 2018-04-28 DIAGNOSIS — C19 Malignant neoplasm of rectosigmoid junction: Secondary | ICD-10-CM | POA: Diagnosis not present

## 2018-04-28 MED ORDER — IOHEXOL 300 MG/ML  SOLN
100.0000 mL | Freq: Once | INTRAMUSCULAR | Status: AC | PRN
  Administered 2018-04-28: 100 mL via INTRAVENOUS

## 2018-04-28 MED ORDER — SODIUM CHLORIDE (PF) 0.9 % IJ SOLN
INTRAMUSCULAR | Status: AC
  Filled 2018-04-28: qty 50

## 2018-04-29 ENCOUNTER — Telehealth: Payer: Self-pay | Admitting: Hematology

## 2018-04-29 NOTE — Telephone Encounter (Signed)
Patient called to inquire the result of her restaging CT scan from yesterday.  I personally reviewed the scan, and discussed with her over the phone.  Previously liver metastasis has been stable, however she has developed a few new liver lesions, consistent with disease progression.  I discussed the option of adding oxaliplatin to her current regimen, she will think about it.  We will discuss more when she come in next week.  Truitt Merle  04/29/2018

## 2018-05-03 ENCOUNTER — Telehealth: Payer: Self-pay | Admitting: Hematology

## 2018-05-03 NOTE — Telephone Encounter (Signed)
Left message = r/s appt per 2/10 sch message  - pt request -  Dr. Niel Hummer

## 2018-05-05 ENCOUNTER — Ambulatory Visit (INDEPENDENT_AMBULATORY_CARE_PROVIDER_SITE_OTHER): Payer: BLUE CROSS/BLUE SHIELD | Admitting: Adult Health

## 2018-05-05 ENCOUNTER — Ambulatory Visit: Payer: Self-pay

## 2018-05-05 ENCOUNTER — Ambulatory Visit: Payer: Self-pay | Admitting: Hematology

## 2018-05-05 ENCOUNTER — Other Ambulatory Visit: Payer: Self-pay

## 2018-05-05 ENCOUNTER — Encounter: Payer: Self-pay | Admitting: Adult Health

## 2018-05-05 VITALS — BP 130/85 | HR 98 | Temp 98.5°F | Ht 65.0 in | Wt 163.1 lb

## 2018-05-05 DIAGNOSIS — H6123 Impacted cerumen, bilateral: Secondary | ICD-10-CM

## 2018-05-05 DIAGNOSIS — E039 Hypothyroidism, unspecified: Secondary | ICD-10-CM

## 2018-05-05 NOTE — Assessment & Plan Note (Signed)
She requests refill on Levothyroxine 121mcg, however last TSH level was 05/31/2017- 1.272 We will call you when TSH level results are available and I will refill your Levothyroxine medication. Good luck with the new chemotherapy regime!

## 2018-05-05 NOTE — Patient Instructions (Addendum)
Earwax Buildup, Adult The ears produce a substance called earwax that helps keep bacteria out of the ear and protects the skin in the ear canal. Occasionally, earwax can build up in the ear and cause discomfort or hearing loss. What increases the risk? This condition is more likely to develop in people who:  Are female.  Are elderly.  Naturally produce more earwax.  Clean their ears often with cotton swabs.  Use earplugs often.  Use in-ear headphones often.  Wear hearing aids.  Have narrow ear canals.  Have earwax that is overly thick or sticky.  Have eczema.  Are dehydrated.  Have excess hair in the ear canal. What are the signs or symptoms? Symptoms of this condition include:  Reduced or muffled hearing.  A feeling of fullness in the ear or feeling that the ear is plugged.  Fluid coming from the ear.  Ear pain.  Ear itch.  Ringing in the ear.  Coughing.  An obvious piece of earwax that can be seen inside the ear canal. How is this diagnosed? This condition may be diagnosed based on:  Your symptoms.  Your medical history.  An ear exam. During the exam, your health care provider will look into your ear with an instrument called an otoscope. You may have tests, including a hearing test. How is this treated? This condition may be treated by:  Using ear drops to soften the earwax.  Having the earwax removed by a health care provider. The health care provider may: ? Flush the ear with water. ? Use an instrument that has a loop on the end (curette). ? Use a suction device.  Surgery to remove the wax buildup. This may be done in severe cases. Follow these instructions at home:   Take over-the-counter and prescription medicines only as told by your health care provider.  Do not put any objects, including cotton swabs, into your ear. You can clean the opening of your ear canal with a washcloth or facial tissue.  Follow instructions from your health care  provider about cleaning your ears. Do not over-clean your ears.  Drink enough fluid to keep your urine clear or pale yellow. This will help to thin the earwax.  Keep all follow-up visits as told by your health care provider. If earwax builds up in your ears often or if you use hearing aids, consider seeing your health care provider for routine, preventive ear cleanings. Ask your health care provider how often you should schedule your cleanings.  If you have hearing aids, clean them according to instructions from the manufacturer and your health care provider. Contact a health care provider if:  You have ear pain.  You develop a fever.  You have blood, pus, or other fluid coming from your ear.  You have hearing loss.  You have ringing in your ears that does not go away.  Your symptoms do not improve with treatment.  You feel like the room is spinning (vertigo). Summary  Earwax can build up in the ear and cause discomfort or hearing loss.  The most common symptoms of this condition include reduced or muffled hearing and a feeling of fullness in the ear or feeling that the ear is plugged.  This condition may be diagnosed based on your symptoms, your medical history, and an ear exam.  This condition may be treated by using ear drops to soften the earwax or by having the earwax removed by a health care provider.  Do not put any   objects, including cotton swabs, into your ear. You can clean the opening of your ear canal with a washcloth or facial tissue. This information is not intended to replace advice given to you by your health care provider. Make sure you discuss any questions you have with your health care provider. Document Released: 04/17/2004 Document Revised: 02/19/2017 Document Reviewed: 05/21/2016 Elsevier Interactive Patient Education  2019 [place] following-up with either ENT or Urgent Care to have cerumen removed, they may have removal system that is  gentler to you. Also may try OTC Debrox to help soften the ear wax. We will call you when TSH level results are available and I will refill your Levothyroxine medication. Good luck with the new chemotherapy regime! NICE TO SEE YOU!

## 2018-05-05 NOTE — Progress Notes (Signed)
Subjective:    Patient ID: Beverly Schwartz, female    DOB: 1970-11-15, 48 y.o.   MRN: [id number]  HPI:  Ms. Mergenthaler presents with L ear otalgia  (sharp, 3/10) and decrease in hearing that began 3 days ago.  She reports clear nasal drainage. She reports hx of excessive cerumen and has been to ENT in past for cerumen removal. She denies cough/fever/night sweats. She requests refill on Levothyroxine 139mcg, however last TSH level was 05/31/2017- 1.272 TSH drawn today, will refill once level resulted She has been using OTC Advil for pain control, last dose was last night at bedtime, current temp 98.24f oral Of Note: 04/28/2018 CT Abdomen Pelvis W- IMPRESSION: 1. Index liver metastases measured on the previous study show no substantial interval change although new liver lesions on today's exam are concerning for progressive disease. 2. Mild lymphadenopathy in the upper abdomen is stable. 3. Persistent mild ill-defined wall thickening in the distal sigmoid colon near the rectosigmoid junction. 4.  Aortic Atherosclerois (ICD10-170.0) Oncology is adding third agent into her chemotherapy regime   Patient Care Team    Relationship Specialty Notifications Start End  Mina Marble D, NP PCP - General Family Medicine  07/07/16   Delrae Rend, MD Consulting Physician Endocrinology  07/07/16   Truitt Merle, Stem Physician Hematology  06/05/17   Alla Feeling, NP Nurse Practitioner Nurse Practitioner  06/05/17   Clarene Essex, MD Consulting Physician Gastroenterology  06/05/17     Patient Active Problem List   Diagnosis Date Noted  . Excessive cerumen in both ear canals 05/05/2018  . Acute respiratory infection 03/30/2018  . Flu-like symptoms 03/30/2018  . Genetic testing 08/10/2017  . Family history of breast cancer   . Family history of stomach cancer   . Goals of care, counseling/discussion 06/15/2017  . 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 SIRS (systemic  inflammatory response syndrome) ([place]) 05/30/2017  . Healthcare maintenance 12/10/2016  . Elevated LFTs 12/10/2016  . Generalized abdominal pain 09/30/2016  . Anxiety 09/30/2016  . Sinusitis 09/03/2016  . Diabetes mellitus without complication ([place]) 64/40/3474  . Hypothyroid 07/07/2016  . Hypertension 07/07/2016  . Hyperlipidemia 08/06/2010  . Tachycardia   . Chest pain   . Dizziness   . Obesity      Past Medical History:  Diagnosis Date  . Anxiety   . Cancer ([place])    colon, liver  . Chest pain   . Colon cancer ([place])   . Complication of anesthesia   . Depression   . Diabetes mellitus   . Dizziness   . Family history of breast cancer   . Family history of stomach cancer   . Hyperlipidemia   . Hypertension   . Hypothyroidism   . Obesity   . PONV (postoperative nausea and vomiting)   . Tachycardia      Past Surgical History:  Procedure Laterality Date  . FLEXIBLE SIGMOIDOSCOPY N/A 05/31/2017   Procedure: FLEXIBLE SIGMOIDOSCOPY;  Surgeon: Clarene Essex, MD;  Location: [facility] ENDOSCOPY;  Service: Endoscopy;  Laterality: N/A;  . WISDOM TOOTH EXTRACTION     age 51's     Family History  Problem Relation Age of Onset  . Heart disease Father   . Healthy Mother   . Hyperlipidemia Sister   . Healthy Brother   . Heart disease Paternal Uncle   . Heart attack Paternal Uncle   . Healthy Son   . Breast cancer Maternal Grandmother  d. in mid 45s  . Stomach cancer Paternal Grandfather        d. 74  . Colon cancer Neg Hx   . Esophageal cancer Neg Hx   . Rectal cancer Neg Hx   . Liver cancer Neg Hx      Social History   Substance and Sexual Activity  Drug Use No     Social History   Substance and Sexual Activity  Alcohol Use No     Social History   Tobacco Use  Smoking Status Never Smoker  Smokeless Tobacco Never Used     Outpatient Encounter Medications as of 05/05/2018  Medication Sig  . acetaminophen-codeine (TYLENOL #3) 300-30 MG tablet Take 1  tablet by mouth every 6 (six) hours as needed for moderate pain.  Marland Kitchen albuterol (PROVENTIL HFA;VENTOLIN HFA) 108 (90 Base) MCG/ACT inhaler Inhale 2 puffs into the lungs every 6 (six) hours as needed for wheezing or shortness of breath.  . ALPRAZolam (XANAX) 1 MG tablet Take 1 tablet (1 mg total) by mouth at bedtime as needed for anxiety.  Marland Kitchen atorvastatin (LIPITOR) 10 MG tablet Take 1 tablet (10 mg total) by mouth daily.  . benzonatate (TESSALON) 100 MG capsule Take 1 capsule (100 mg total) by mouth 3 (three) times daily as needed for cough.  . capecitabine (XELODA) 500 MG tablet TAKE 4 TABLETS (2000MG ) IN AM & AND 3 TABS (1500MG ) IN PM, WITHIN 30MIN OF FINISHING FOOD. TAKE ON DAYS 1-7 & 15-21 OF EACH 28 DAY CYCLE  . cetirizine (ZYRTEC ALLERGY) 10 MG tablet Take 1 tablet (10 mg total) by mouth daily.  Marland Kitchen CINNAMON PO Take 1 tablet by mouth daily.   . clindamycin (CLINDAGEL) 1 % gel Apply topically 2 (two) times daily.  . fluticasone (FLONASE) 50 MCG/ACT nasal spray Place 1 spray into both nostrils daily. (Patient taking differently: Place 1 spray into both nostrils daily as needed for allergies. )  . insulin NPH-regular Human (NOVOLIN 70/30) (70-30) 100 UNIT/ML injection Inject 25 Units into the skin 2 (two) times daily with a meal.   . levothyroxine (SYNTHROID, LEVOTHROID) 112 MCG tablet Take 112 mcg by mouth daily before breakfast.  . magnesium oxide (MAG-OX) 400 (241.3 Mg) MG tablet Take 1 tablet (400 mg total) by mouth 2 (two) times daily.  . Omega-3 Fatty Acids (FISH OIL) 1000 MG CAPS Take 1,000 mg by mouth daily.   . polyethylene glycol (MIRALAX / GLYCOLAX) packet Take 17 g by mouth daily.  . propranolol (INDERAL) 10 MG tablet TAKE 1 TABLET BY MOUTH EVERY DAY  . TURMERIC PO Take 1 capsule by mouth daily.   . urea (CARMOL) 10 % cream Apply topically as needed.  . [DISCONTINUED] azithromycin (ZITHROMAX) 250 MG tablet 2 tabs day one.  1 tab days two-five  . [DISCONTINUED] ergocalciferol (VITAMIN D2)  50000 units capsule Take 1 capsule (50,000 Units total) by mouth once a week.  . [DISCONTINUED] predniSONE (DELTASONE) 20 MG tablet 1 tab every 12 hrs for 3 days, then once daily for 3 days  . [DISCONTINUED] prochlorperazine (COMPAZINE) 10 MG tablet Take 1 tablet (10 mg total) by mouth every 6 (six) hours as needed (NAUSEA).   No facility-administered encounter medications on file as of 05/05/2018.     Allergies: Bupropion and Amoxicillin  Body mass index is 27.14 kg/m.  Blood pressure 130/85, pulse 98, temperature 98.5 F (36.9 C), temperature source Oral, height 5\' 5"  (1.651 m), weight 163 lb 1.6 oz (74 kg), SpO2 100 %.  Review of Systems  Constitutional: Positive for activity change, appetite change and fatigue. Negative for chills, diaphoresis, fever and unexpected weight change.  HENT: Positive for congestion, dental problem, ear pain, hearing loss, postnasal drip, rhinorrhea, sinus pressure, sore throat and voice change. Negative for facial swelling.   Eyes: Negative for visual disturbance.  Respiratory: Negative for cough, chest tightness, shortness of breath, wheezing and stridor.   Cardiovascular: Negative for chest pain, palpitations and leg swelling.  Gastrointestinal: Negative for abdominal pain, blood in stool, constipation, diarrhea, nausea and vomiting.  Neurological: Negative for dizziness and headaches.  Hematological: Bruises/bleeds easily.  Psychiatric/Behavioral: Positive for dysphoric mood. Negative for agitation, behavioral problems, confusion, decreased concentration, hallucinations, self-injury, sleep disturbance and suicidal ideas. The patient is nervous/anxious. The patient is not hyperactive.        Objective:   Physical Exam Vitals signs and nursing note reviewed.  Constitutional:      General: She is not in acute distress.    Appearance: She is ill-appearing. She is not toxic-appearing or diaphoretic.  HENT:     Head: Normocephalic and atraumatic.      Right Ear: No decreased hearing noted. No tenderness. There is no impacted cerumen.     Left Ear: Decreased hearing noted. No tenderness. There is impacted cerumen.     Nose: Congestion and rhinorrhea present.     Right Nostril: Epistaxis present.     Left Nostril: Epistaxis present.     Right Turbinates: Swollen.     Left Turbinates: Swollen.     Right Sinus: No maxillary sinus tenderness or frontal sinus tenderness.     Left Sinus: No maxillary sinus tenderness or frontal sinus tenderness.     Comments: Dried blood in nares bilaterally     Mouth/Throat:     Dentition: Abnormal dentition.     Pharynx: Pharyngeal swelling and posterior oropharyngeal erythema present.     Tonsils: No tonsillar abscesses. Swelling: 1+ on the right. 1+ on the left.     Comments: Red, beefy tongue  Eyes:     Extraocular Movements: Extraocular movements intact.     Conjunctiva/sclera: Conjunctivae normal.     Pupils: Pupils are equal, round, and reactive to light.  Neck:     Musculoskeletal: Normal range of motion.  Cardiovascular:     Rate and Rhythm: Normal rate.     Pulses: Normal pulses.     Heart sounds: Normal heart sounds. No murmur. No friction rub. No gallop.   Pulmonary:     Effort: Pulmonary effort is normal. No respiratory distress.     Breath sounds: Normal breath sounds. No stridor. No wheezing, rhonchi or rales.  Chest:     Chest wall: No tenderness.  Skin:    General: Skin is warm and dry.     Capillary Refill: Capillary refill takes less than 2 seconds.     Coloration: Skin is ashen and pale.     Comments: Skin is extremely dry and friable Dried blood noted in nares bilaterally   Neurological:     Mental Status: She is alert.  Psychiatric:        Mood and Affect: Mood normal.        Behavior: Behavior normal.        Thought Content: Thought content normal.        Judgment: Judgment normal.       Assessment & Plan:   1. Hypothyroidism, unspecified type   2. Excessive  cerumen in both ear canals  Excessive cerumen in both ear canals Unable to tolerate water flush Due to her friable tissue, unable to use curette to remove cerumen  Recommend following-up with either ENT or Urgent Care to have cerumen removed, they may have removal system that is gentler to you. Also may try OTC Debrox to help soften the ear wax. We will call you when TSH level results are available and I will refill your Levothyroxine medication. Good luck with the new chemotherapy regime!  Hypothyroid She requests refill on Levothyroxine 167mcg, however last TSH level was 05/31/2017- 1.272 We will call you when TSH level results are available and I will refill your Levothyroxine medication. Good luck with the new chemotherapy regime!    FOLLOW-UP:  Return if symptoms worsen or fail to improve.

## 2018-05-05 NOTE — Assessment & Plan Note (Addendum)
Unable to tolerate water flush Due to her friable tissue, unable to use curette to remove cerumen  Recommend following-up with either ENT or Urgent Care to have cerumen removed, they may have removal system that is gentler to you. Also may try OTC Debrox to help soften the ear wax. We will call you when TSH level results are available and I will refill your Levothyroxine medication. Good luck with the new chemotherapy regime!

## 2018-05-06 ENCOUNTER — Ambulatory Visit: Payer: Self-pay

## 2018-05-06 ENCOUNTER — Ambulatory Visit: Payer: Self-pay | Admitting: Hematology

## 2018-05-06 ENCOUNTER — Other Ambulatory Visit: Payer: Self-pay

## 2018-05-06 ENCOUNTER — Other Ambulatory Visit: Payer: Self-pay | Admitting: Adult Health

## 2018-05-06 ENCOUNTER — Other Ambulatory Visit: Payer: Self-pay | Admitting: General Practice

## 2018-05-06 LAB — TSH: TSH: 2.16 u[IU]/mL (ref 0.450–4.500)

## 2018-05-06 MED ORDER — LEVOTHYROXINE SODIUM 112 MCG PO TABS: 112.0000 ug | ORAL_TABLET | Freq: Every day | ORAL | 3 refills | Status: DC

## 2018-05-07 NOTE — Progress Notes (Addendum)
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 Abdominopelvic adenopathy. Rectosigmoid mass with suggestion of partial obstruction as evidenced by large colonic stool burden more proximally. 2.  Aortic Atherosclerosis (ICD10-I70.0).  This is age advanced.    05/31/2017 Tumor Marker    CEA 353.3 (elevated) AFP 4.5 (normal)    05/31/2017 Initial Biopsy    Diagnosis Colon, biopsy, sigmoid - INVASIVE ADENOCARCINOMA - SEE COMMENT    05/31/2017 Imaging    CT CHEST IMPRESSION: 1. No evidence of metastatic disease in the chest. 2. No acute findings.  Aortic Atherosclerosis (ICD10-I70.0).     05/31/2017 Procedure    Colonoscopy per Dr. Watt Climes Findings: An infiltrative and ulcerated partially obstructing medium-sized mass was found in the recto-sigmoid colon. The mass was circumferential. The mass measured four cm in length. No bleeding was present.   Impression - One small polyp in the rectum. - The examination was otherwise normal. - Malignant partially obstructing tumor in  the recto-sigmoid colon. Biopsied. - One medium polyp in the proximal sigmoid colon. - The examination was otherwise normal. - Internal hemorrhoids.    06/03/2017 Initial Diagnosis    Malignant neoplasm of transverse colon ([place])    06/11/2017 Cancer Staging    Staging form: Colon and Rectum, AJCC 8th Edition - Clinical stage from 06/11/2017: Stage IVA (cTX, cN1b, pM1a) - Signed by Truitt Merle, MD on 06/15/2017    06/11/2017 Pathology Results    Liver biopsy confirmed metastatic colon cancer    07/23/2017 Imaging    CT CAP IMPRESSION: 1. Mild progression of hepatic metastasis. 2. Similar rectosigmoid primary with abdominopelvic nodal metastasis. 3.  No acute process or evidence of metastatic disease in the chest. 4. Aortic Atherosclerosis (ICD10-I70.0). Coronary artery atherosclerosis. This is age advanced. 5. Proximal colonic constipation, again suggesting a component of partial obstruction at the primary site. 6. Mild ascending aortic dilatation at 4.1 cm.    08/25/2017 - 05/10/2018 Chemotherapy    -Xeloda 1540m BID 1 week on and 1 week off starting 08/25/17. Due to her worsening hand-foot syndrome her dose was reduced to 15042min the AM and 100059mn the PM. Due to disease progression we swithced her to CAPOX  -Vectibix every 2 weeks starting 08/25/17. Due to skin rash will stop starting 05/10/18.      12/14/2017 Imaging    IMPRESSION: 1. Response to therapy. Marked decrease in hepatic metastatic burden. More mild decrease in abdominopelvic adenopathy and definition of rectosigmoid primary. 2.  No acute process or evidence of metastatic disease in the chest. 3.  Aortic Atherosclerosis (ICD10-I70.0).     04/28/2018 Imaging    CT CAP 04/28/18  IMPRESSION: 1. Index  liver metastases measured on the previous study show no substantial interval change although new liver lesions on today's exam are concerning for progressive disease. 2. Mild lymphadenopathy in the upper abdomen is stable. 3.  Persistent mild ill-defined wall thickening in the distal sigmoid colon near the rectosigmoid junction. 4.  Aortic Atherosclerois (ICD10-170.0)     05/17/2018 -  Chemotherapy    CAPOX every 3 weeks with Xeloda 1517m BID 2 weeks on/1week off starting 05/17/18 -Add Avastin to first cycle.       CURRENT THERAPY:  Second line CAPOX and Avastin every 3 weeks with Xeloda 15064mBID 2 weeks on/1week off starting 05/17/18   INTERVAL HISTORY:  KiTERRAH DECOSTERs here for a follow up and ongoing treatment. She presents to the clinic today with her mother. 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 She still has dry skin rash of face especially her nose. She notes having bleeding. She overall is tolerating Xeloda.     REVIEW OF SYSTEMS:   Constitutional: Denies fevers, chills or abnormal weight loss Eyes: Denies blurriness of vision Ears, nose, mouth, throat, and face: Denies mucositis or sore throat (+) HOH left ear Respiratory: Denies cough, dyspnea or wheezes Cardiovascular: Denies palpitation, chest discomfort or lower extremity swelling Gastrointestinal:  Denies nausea, heartburn or change in bowel habits Skin: (+) Dry skin rash of face (especially nose) and chest  Lymphatics: Denies new lymphadenopathy or easy bruising Neurological:Denies numbness, tingling or new weaknesses Behavioral/Psych: Mood is stable, no new changes  All other systems were reviewed with the patient and are negative.  MEDICAL HISTORY:  Past Medical History:  Diagnosis Date  . Anxiety   . Cancer ([place]   colon, liver  . Chest pain   . Colon cancer ([facility]  . Complication of anesthesia   . Depression   . Diabetes mellitus   . Dizziness   . Family history of breast cancer   . Family history of stomach cancer   . Hyperlipidemia   . Hypertension   . Hypothyroidism   . Obesity   . PONV (postoperative nausea and vomiting)   . Tachycardia     SURGICAL HISTORY: Past  Surgical History:  Procedure Laterality Date  . FLEXIBLE SIGMOIDOSCOPY N/A 05/31/2017   Procedure: FLEXIBLE SIGMOIDOSCOPY;  Surgeon: MaClarene EssexMD;  Location: [facility];  Service: Endoscopy;  Laterality: N/A;  . WISDOM TOOTH EXTRACTION     age 48's  I have reviewed the social history and family history with the patient and they are unchanged from previous note.  ALLERGIES:  is allergic to bupropion and amoxicillin.  MEDICATIONS:  Current Outpatient Medications  Medication Sig Dispense Refill  . acetaminophen-codeine (TYLENOL #3) 300-30 MG tablet Take 1 tablet by mouth every 6 (six) hours as needed for moderate pain. 30 tablet 0  . albuterol (PROVENTIL HFA;VENTOLIN HFA) 108 (90 Base) MCG/ACT inhaler Inhale 2 puffs into the lungs every 6 (six) hours as needed for wheezing or shortness of breath. 1 Inhaler 0  . ALPRAZolam (XANAX) 1 MG tablet Take 1 tablet (1 mg total) by mouth at bedtime as needed for anxiety. 30 tablet 0  . atorvastatin (LIPITOR) 10 MG tablet Take 1 tablet (10 mg total) by mouth daily. 90 tablet 3  . benzonatate (TESSALON) 100 MG capsule Take 1 capsule (100 mg total) by mouth 3 (three) times daily as needed for cough. 20 capsule 0  . capecitabine (XELODA) 500 MG  tablet TAKE 4 TABLETS (2000MG) IN AM & AND 3 TABS (1500MG) IN PM, WITHIN 30MIN OF FINISHING FOOD. TAKE ON DAYS 1-7 & 15-21 OF EACH 28 DAY CYCLE 294 tablet 0  . cetirizine (ZYRTEC ALLERGY) 10 MG tablet Take 1 tablet (10 mg total) by mouth daily. 30 tablet 1  . CINNAMON PO Take 1 tablet by mouth daily.     . clindamycin (CLINDAGEL) 1 % gel Apply topically 2 (two) times daily. 60 g 1  . fluticasone (FLONASE) 50 MCG/ACT nasal spray Place 1 spray into both nostrils daily. (Patient taking differently: Place 1 spray into both nostrils daily as needed for allergies. ) 16 g 2  . insulin NPH-regular Human (NOVOLIN 70/30) (70-30) 100 UNIT/ML injection Inject 25 Units into the skin 2 (two) times daily with a meal.     .  levothyroxine (SYNTHROID, LEVOTHROID) 112 MCG tablet Take 1 tablet (112 mcg total) by mouth daily before breakfast. 90 tablet 3  . magnesium oxide (MAG-OX) 400 (241.3 Mg) MG tablet Take 1 tablet (400 mg total) by mouth 2 (two) times daily. 60 tablet 1  . Omega-3 Fatty Acids (FISH OIL) 1000 MG CAPS Take 1,000 mg by mouth daily.     . polyethylene glycol (MIRALAX / GLYCOLAX) packet Take 17 g by mouth daily. 14 each 0  . propranolol (INDERAL) 10 MG tablet TAKE 1 TABLET BY MOUTH EVERY DAY 90 tablet 1  . TURMERIC PO Take 1 capsule by mouth daily.     . urea (CARMOL) 10 % cream Apply topically as needed. 71 g 0   No current facility-administered medications for this visit.    Facility-Administered Medications Ordered in Other Visits  Medication Dose Route Frequency Provider Last Rate Last Dose  . 0.9 %  sodium chloride infusion   Intravenous Once Truitt Merle, MD      . sodium chloride 0.9 % 250 mL with potassium chloride 20 mEq, magnesium sulfate 2 g infusion   Intravenous Once Truitt Merle, MD        PHYSICAL EXAMINATION: ECOG PERFORMANCE STATUS: 1 - Symptomatic but completely ambulatory  Vitals:   05/10/18 1402  BP: 120/86  Pulse: 86  Resp: 18  Temp: 98.8 F (37.1 C)  SpO2: 100%   Filed Weights   05/10/18 1402  Weight: 162 lb 11.2 oz (73.8 kg)    GENERAL:alert, no distress and comfortable SKIN: skin color, texture, turgor are normal, scattered acne-like rash on her face, neck and upper chest, and very dry skin with peeling and crusting below her nostrils  EYES: normal, Conjunctiva are pink and non-injected, sclera clear OROPHARYNX:no exudate, no erythema and lips, buccal mucosa, and tongue normal  NECK: supple, thyroid normal size, non-tender, without nodularity LYMPH:  no palpable lymphadenopathy in the cervical, axillary or inguinal LUNGS: clear to auscultation and percussion with normal breathing effort HEART: regular rate & rhythm and no murmurs and no lower extremity  edema ABDOMEN:abdomen soft, non-tender and normal bowel sounds Musculoskeletal:no cyanosis of digits and no clubbing  NEURO: alert & oriented x 3 with fluent speech, no focal motor/sensory deficits  LABORATORY DATA:  I have reviewed the data as listed CBC Latest Ref Rng & Units 05/10/2018 04/21/2018 04/07/2018  WBC 4.0 - 10.5 K/uL 8.8 9.5 7.7  Hemoglobin 12.0 - 15.0 g/dL 13.6 13.9 13.2  Hematocrit 36.0 - 46.0 % 41.8 41.3 39.7  Platelets 150 - 400 K/uL 228 198 296     CMP Latest Ref Rng & Units 05/10/2018 04/21/2018 04/07/2018  Glucose  70 - 99 mg/dL 195(H) 161(H) 186(H)  BUN 6 - 20 mg/dL <4(L) 8 8  Creatinine 0.44 - 1.00 mg/dL 0.65 0.67 0.78  Sodium 135 - 145 mmol/L 140 141 143  Potassium 3.5 - 5.1 mmol/L 3.4(L) 3.6 3.3(L)  Chloride 98 - 111 mmol/L 104 106 105  CO2 22 - 32 mmol/L _0 Calcium 8.9 - 10.3 mg/dL 6.1(LL) 7.0(L) 7.3(L)  Total Protein 6.5 - 8.1 g/dL 7.1 7.3 7.0  Total Bilirubin 0.3 - 1.2 mg/dL 0.7 0.6 0.6  Alkaline Phos 38 - 126 U/L 155(H) 160(H) 148(H)  AST 15 - 41 U/L _1 ALT 0 - 44 U/L _2 RADIOGRAPHIC STUDIES: I have personally reviewed the radiological images as listed and agreed with the findings in the report. No results found.   ASSESSMENT & PLAN:  Beverly Schwartz is a 48 y.o. female with    1. Sigmoid adenocarcinoma, with abdominopelvic adenopathy andhepatic metastasis, stage IV, MSI-stable , KRAS/NRAS/BRAF wild type -Diagnosed in 05/2017. Treated withfirst-line chemotherapyXelodaandVectibix. Patient declined intensive chemo regiments previously due to fear of side effects. Currently on Xeloda 1553m BID and Vectibix. -She previously developed skin rash and hand-foot syndrome with Xeloda,improved with dose reduction. She is overall tolerating well. -We personally reviewed the images and discussed the findings of her CT CAP from 04/28/18 in great detail which shows no interval change in known metastasis with a few new liver lesion,  this is unfortunately consistent with disease progression.  -I recommend her to change treatment to regiment.  Discussed of the second line options CAPOX, FOLFOX or FOLFIRI. I discussed side effects and logistics of both each option in great detail. Given significant skin rash and a disease progression, we will hold Vectibix and change her to Avastin.  After lengthy discussion, patient opted CAPOX and Avastin, she does not want a port and of the infusion pump at this point.   -Plan to start next week and hold Xeloda until then.  --Chemotherapy consent: Side effects including but does not not limited to, fatigue, nausea, vomiting, diarrhea, hair loss, cold sensitivity and neuropathy, fluid retention, renal and kidney dysfunction, neutropenic fever, needed for blood transfusion, bleeding, coronary artery spasm and heart attack, heart failure, were discussed with patient in great detail. She agrees to proceed. -The goal of therapy is disease control, to prolong her life and improve her quality of life. -F/u in 2 week, 1 week after her first CAPOX for toxicity checkup  2.  Hypokalemia, hypo-magnesium anemia, hypocalcemia -Probably secondary chemotherapy, especially vectibix -Potassium 3.4 today, magnesium 0.6, calcium 6.1 with albumin 3.3 -I will give IV potassium 10 mill equal, magnesium 4 g, and calcium 2 g over 2 hours today -I again strongly encouraged her to take oral K, Mag and Calcium    3. DM, HTN, HL -f/u with PCP  4. Financial and Social issues, Anxiety, depression -Shefollows up as needed withSocial Worker LHollice Espy-She is currently on Xanax  -Shehas regained insurance with [employer] [place] this year  5.Goal of care discussion  -She understands the goal of care is palliative, her cancer is incurable. The goal of therapy is to prolong her life and improve her quality of life. -She is full code for now   6. Rash on face, chest, scalp and back, moderate -secondary to  Panitumumab -She will continue using hydrocortisone cream and clindamycin gel twice daily -Stable, worse under her nose. I advised her to avoid makeup.  -  Will stop Vectibix starting 05/10/18.   7. Hand-foot syndrome, secondary to Xeloda   -She developed mild hand-foot syndrome, secondary to Xeloda.This has much improved with a dose reduction of Xeloda. -She will continue to use Urea cream -Stable    Plan -CT scan reviewed with mild disease progression -start second line CAPOX and Avastin in a week and continue every 3 weeks, will reduce oxaliplatin to 18m/m2 for first cycle due to pt's concerns of side effects  -Lab, f/u in 2 and 4 weeks  -IV K/MG/calcium today    No problem-specific Assessment & Plan notes found for this encounter.   No orders of the defined types were placed in this encounter.  All questions were answered. The patient knows to call the clinic with any problems, questions or concerns. No barriers to learning was detected. I spent 25 minutes counseling the patient face to face. The total time spent in the appointment was 30 minutes and more than 50% was on counseling and review of test results     YTruitt Merle MD 05/10/2018   I, AJoslyn Devon am acting as scribe for YTruitt Merle MD.   I have reviewed the above documentation for accuracy and completeness, and I agree with the above.

## 2018-05-10 ENCOUNTER — Inpatient Hospital Stay (HOSPITAL_BASED_OUTPATIENT_CLINIC_OR_DEPARTMENT_OTHER): Payer: BLUE CROSS/BLUE SHIELD | Admitting: Hematology

## 2018-05-10 ENCOUNTER — Telehealth: Payer: Self-pay | Admitting: Hematology

## 2018-05-10 ENCOUNTER — Inpatient Hospital Stay: Payer: BLUE CROSS/BLUE SHIELD

## 2018-05-10 ENCOUNTER — Encounter: Payer: Self-pay | Admitting: Hematology

## 2018-05-10 ENCOUNTER — Inpatient Hospital Stay: Payer: BLUE CROSS/BLUE SHIELD | Attending: Hematology

## 2018-05-10 VITALS — BP 120/86 | HR 86 | Temp 98.8°F | Resp 18 | Ht 65.0 in | Wt 162.7 lb

## 2018-05-10 DIAGNOSIS — E876 Hypokalemia: Secondary | ICD-10-CM | POA: Diagnosis not present

## 2018-05-10 DIAGNOSIS — D500 Iron deficiency anemia secondary to blood loss (chronic): Secondary | ICD-10-CM

## 2018-05-10 DIAGNOSIS — C19 Malignant neoplasm of rectosigmoid junction: Secondary | ICD-10-CM

## 2018-05-10 DIAGNOSIS — E119 Type 2 diabetes mellitus without complications: Secondary | ICD-10-CM

## 2018-05-10 DIAGNOSIS — Z9221 Personal history of antineoplastic chemotherapy: Secondary | ICD-10-CM

## 2018-05-10 DIAGNOSIS — F419 Anxiety disorder, unspecified: Secondary | ICD-10-CM | POA: Insufficient documentation

## 2018-05-10 DIAGNOSIS — L270 Generalized skin eruption due to drugs and medicaments taken internally: Secondary | ICD-10-CM

## 2018-05-10 DIAGNOSIS — Z5112 Encounter for antineoplastic immunotherapy: Secondary | ICD-10-CM | POA: Insufficient documentation

## 2018-05-10 DIAGNOSIS — F329 Major depressive disorder, single episode, unspecified: Secondary | ICD-10-CM

## 2018-05-10 DIAGNOSIS — D649 Anemia, unspecified: Secondary | ICD-10-CM | POA: Insufficient documentation

## 2018-05-10 DIAGNOSIS — C787 Secondary malignant neoplasm of liver and intrahepatic bile duct: Secondary | ICD-10-CM

## 2018-05-10 DIAGNOSIS — Z794 Long term (current) use of insulin: Secondary | ICD-10-CM

## 2018-05-10 DIAGNOSIS — I10 Essential (primary) hypertension: Secondary | ICD-10-CM | POA: Diagnosis not present

## 2018-05-10 DIAGNOSIS — Z79899 Other long term (current) drug therapy: Secondary | ICD-10-CM

## 2018-05-10 DIAGNOSIS — T451X5D Adverse effect of antineoplastic and immunosuppressive drugs, subsequent encounter: Secondary | ICD-10-CM | POA: Insufficient documentation

## 2018-05-10 DIAGNOSIS — C187 Malignant neoplasm of sigmoid colon: Secondary | ICD-10-CM

## 2018-05-10 LAB — CMP (CANCER CENTER ONLY)
ALT: 14 U/L (ref 0–44)
AST: 29 U/L (ref 15–41)
Albumin: 3.3 g/dL — ABNORMAL LOW (ref 3.5–5.0)
Alkaline Phosphatase: 155 U/L — ABNORMAL HIGH (ref 38–126)
Anion gap: 12 (ref 5–15)
BUN: <4 mg/dL — ABNORMAL LOW (ref 6–20)
CO2: 24 mmol/L (ref 22–32)
Calcium: 6.1 mg/dL — CL (ref 8.9–10.3)
Chloride: 104 mmol/L (ref 98–111)
Creatinine: 0.65 mg/dL (ref 0.44–1.00)
GFR, Est AFR Am: >60 mL/min (ref >60)
GFR, Estimated: >60 mL/min (ref >60)
Glucose, Bld: 195 mg/dL — ABNORMAL HIGH (ref 70–99)
Potassium: 3.4 mmol/L — ABNORMAL LOW (ref 3.5–5.1)
Sodium: 140 mmol/L (ref 135–145)
Total Bilirubin: 0.7 mg/dL (ref 0.3–1.2)
Total Protein: 7.1 g/dL (ref 6.5–8.1)

## 2018-05-10 LAB — CBC WITH DIFFERENTIAL (CANCER CENTER ONLY)
Abs Immature Granulocytes: 0.01 10*3/uL (ref 0.00–0.07)
Basophils Absolute: 0 10*3/uL (ref 0.0–0.1)
Basophils Relative: 1 %
Eosinophils Absolute: 0.5 10*3/uL (ref 0.0–0.5)
Eosinophils Relative: 5 %
HCT: 41.8 % (ref 36.0–46.0)
Hemoglobin: 13.6 g/dL (ref 12.0–15.0)
Immature Granulocytes: 0 %
Lymphocytes Relative: 24 %
Lymphs Abs: 2.1 10*3/uL (ref 0.7–4.0)
MCH: 29.6 pg (ref 26.0–34.0)
MCHC: 32.5 g/dL (ref 30.0–36.0)
MCV: 91.1 fL (ref 80.0–100.0)
Monocytes Absolute: 0.5 10*3/uL (ref 0.1–1.0)
Monocytes Relative: 6 %
Neutro Abs: 5.6 10*3/uL (ref 1.7–7.7)
Neutrophils Relative %: 64 %
Platelet Count: 228 10*3/uL (ref 150–400)
RBC: 4.59 MIL/uL (ref 3.87–5.11)
RDW: 16.3 % — ABNORMAL HIGH (ref 11.5–15.5)
WBC Count: 8.8 10*3/uL (ref 4.0–10.5)
nRBC: 0 % (ref 0.0–0.2)

## 2018-05-10 LAB — MAGNESIUM: Magnesium: 0.9 mg/dL — CL (ref 1.7–2.4)

## 2018-05-10 MED ORDER — SODIUM CHLORIDE 0.9 % IV SOLN: Freq: Once | INTRAVENOUS | Status: DC

## 2018-05-10 MED ORDER — POTASSIUM CHLORIDE 10 MEQ/100ML IV SOLN
10.0000 meq | Freq: Once | INTRAVENOUS | Status: AC
  Administered 2018-05-10: 10 meq via INTRAVENOUS
  Filled 2018-05-10: qty 100

## 2018-05-10 MED ORDER — MAGNESIUM SULFATE 4 GM/100ML IV SOLN
4.0000 g | Freq: Once | INTRAVENOUS | Status: AC
  Administered 2018-05-10: 4 g via INTRAVENOUS
  Filled 2018-05-10: qty 100

## 2018-05-10 MED ORDER — SODIUM CHLORIDE 0.9 % IV SOLN
Freq: Once | INTRAVENOUS | Status: AC
  Administered 2018-05-10: 17:00:00 via INTRAVENOUS
  Filled 2018-05-10: qty 250

## 2018-05-10 MED ORDER — SODIUM CHLORIDE 0.9 % IV SOLN
2.0000 g | Freq: Once | INTRAVENOUS | Status: AC
  Administered 2018-05-10: 2 g via INTRAVENOUS
  Filled 2018-05-10: qty 20

## 2018-05-10 NOTE — Patient Instructions (Signed)
Hypokalemia Hypokalemia means that the amount of potassium in the blood is lower than normal.Potassium is a chemical that helps regulate the amount of fluid in the body (electrolyte). It also stimulates muscle tightening (contraction) and helps nerves work properly.Normally, most of the body's potassium is inside of cells, and only a very small amount is in the blood. Because the amount in the blood is so small, minor changes to potassium levels in the blood can be life-threatening. What are the causes? This condition may be caused by:  Antibiotic medicine.  Diarrhea or vomiting. Taking too much of a medicine that helps you have a bowel movement (laxative) can cause diarrhea and lead to hypokalemia.  Chronic kidney disease (CKD).  Medicines that help the body get rid of excess fluid (diuretics).  Eating disorders, such as bulimia.  Low magnesium levels in the body.  Sweating a lot. What are the signs or symptoms? Symptoms of this condition include:  Weakness.  Constipation.  Fatigue.  Muscle cramps.  Mental confusion.  Skipped heartbeats or irregular heartbeat (palpitations).  Tingling or numbness. How is this diagnosed? This condition is diagnosed with a blood test. How is this treated? Hypokalemia can be treated by taking potassium supplements by mouth or adjusting the medicines that you take. Treatment may also include eating more foods that contain a lot of potassium. If your potassium level is very low, you may need to get potassium through an IV tube in one of your veins and be monitored in the hospital. Follow these instructions at home:   Take over-the-counter and prescription medicines only as told by your health care provider. This includes vitamins and supplements.  Eat a healthy diet. A healthy diet includes fresh fruits and vegetables, whole grains, healthy fats, and lean proteins.  If instructed, eat more foods that contain a lot of potassium, such  as: ? Nuts, such as peanuts and pistachios. ? Seeds, such as sunflower seeds and pumpkin seeds. ? Peas, lentils, and lima beans. ? Whole grain and bran cereals and breads. ? Fresh fruits and vegetables, such as apricots, avocado, bananas, cantaloupe, kiwi, oranges, tomatoes, asparagus, and potatoes. ? Orange juice. ? Tomato juice. ? Red meats. ? Yogurt.  Keep all follow-up visits as told by your health care provider. This is important. Contact a health care provider if:  You have weakness that gets worse.  You feel your heart pounding or racing.  You vomit.  You have diarrhea.  You have diabetes (diabetes mellitus) and you have trouble keeping your blood sugar (glucose) in your target range. Get help right away if:  You have chest pain.  You have shortness of breath.  You have vomiting or diarrhea that lasts for more than 2 days.  You faint. This information is not intended to replace advice given to you by your health care provider. Make sure you discuss any questions you have with your health care provider. Document Released: 03/10/2005 Document Revised: 10/27/2015 Document Reviewed: 10/27/2015 Elsevier Interactive Patient Education  2019 [employer].   Hypocalcemia, Adult Hypocalcemia is when the level of calcium in a person's blood is below normal. Calcium is a mineral that is used by the body in many ways. A lack of blood calcium can affect the heart and muscles, make the bones more likely to break, and cause other problems. What are the causes? This condition may be caused by:  Decreased production (hypoparathyroidism) or improper use of parathyroid hormone.  Problems with the parathyroid glands or surgical removal of  these glands.  Problems with parathyroid function after removal of the thyroid gland.  Lack (deficiency) of vitamin D or magnesium or both.  Kidney problems. Less common causes include:  Intestinal problems that interfere with nutrient  absorption.  Alcoholism.  Low levels of a body protein that is called albumin.  Inflammation of the pancreas (pancreatitis).  Certain medicines.  Severe infections (sepsis).  Certain diseases, such as sarcoidosis or hemochromatosis, that cause the parathyroid glands to be filled with cells or substances that are not normally present.  Breakdown of large amounts of muscle fiber.  High levels of phosphate in the body.  Cancer.  Massive blood transfusions, which usually occur with severe trauma. What are the signs or symptoms? Symptoms of this condition include:  Numbness and tingling in the fingers, toes, or around the mouth.  Muscle aches or cramps, especially in the legs, feet, and back.  Muscle twitches.  Abdominal cramping or pain.  Memory problems, confusion, or difficulty thinking.  Depression, anxiety, irritability, or changes in personality.  Fainting.  Chest pain.  Difficulty swallowing.  Changes in the sound of the voice.  Shortness of breath or wheezing.  General weakness and fatigue. Symptoms of severe hypocalcemia include:  Shaking uncontrollably (seizures).  Seizure of the voice box (laryngospasm).  Fast heartbeats (palpitations) and abnormal heart rhythms (arrhythmias). Long-term symptoms of this condition include:  Coarse, brittle hair and nails.  Dry skin or lasting (chronic) skin diseases (psoriasis, eczema,, or dermatitis).  Clouding of the eye lens (cataracts). How is this diagnosed? This condition is usually diagnosed with a blood test. You may also have other tests to help determine the underlying cause of the condition. For example, a test may be done that records the electrical activity of the heart (electrocardiogram,or ECG). How is this treated? Treatment for this condition may include:  Calcium given by mouth (orally) or given through an IV tube that is inserted into one of your veins. The method used for giving calcium will  depend on the severity of the condition.  Other minerals (electrolytes), such as magnesium. Other treatment will depend on the cause of the condition. Follow these instructions at home:  Follow diet instructions from your health care provider or dietitian.  Take supplements only as told by your health care provider.  Keep all follow-up visits as told by your health care provider. This is important. Contact a health care provider if:  You have increased fatigue.  You have increased muscle twitching.  You have new swelling in the feet, ankles, or legs.  You develop changes in mood, memory, or personality. Get help right away if:  You have chest pain.  You have persistent rapid or irregular heartbeats.  You have difficulty breathing.  You faint.  You start to have seizures.  You have confusion. This information is not intended to replace advice given to you by your health care provider. Make sure you discuss any questions you have with your health care provider. Document Released: 08/28/2009 Document Revised: 08/16/2015 Document Reviewed: 07/26/2014 Elsevier Interactive Patient Education  2019 [employer].   Hypomagnesemia Hypomagnesemia is a condition in which the level of magnesium in the blood is low. Magnesium is a mineral that is found in many foods. It is used in many different processes in the body. Hypomagnesemia can affect every organ in the body. In severe cases, it can cause life-threatening problems. What are the causes? This condition may be caused by:  Not getting enough magnesium in your diet.  Malnutrition.  Problems with absorbing magnesium from the intestines.  Dehydration.  Alcohol abuse.  Vomiting.  Severe or chronic diarrhea.  Some medicines, including medicines that make you urinate more (diuretics).  Certain diseases, such as kidney disease, diabetes, celiac disease, and overactive thyroid. What are the signs or symptoms? Symptoms of  this condition include:  Loss of appetite.  Nausea and vomiting.  Involuntary shaking or trembling of a body part (tremor).  Muscle weakness.  Tingling in the arms and legs.  Sudden tightening of muscles (muscle spasms).  Confusion.  Psychiatric issues, such as depression, irritability, or psychosis.  A feeling of fluttering of the heart.  Seizures. These symptoms are more severe if magnesium levels drop suddenly. How is this diagnosed? This condition may be diagnosed based on:  Your symptoms and medical history.  A physical exam.  Blood and urine tests. How is this treated? Treatment depends on the cause and the severity of the condition. It may be treated with:  A magnesium supplement. This can be taken in pill form. If the condition is severe, magnesium is usually given through an IV.  Changes to your diet. You may be directed to eat foods that have a lot of magnesium, such as green leafy vegetables, peas, beans, and nuts.  Stopping any intake of alcohol. Follow these instructions at home:      Make sure that your diet includes foods with magnesium. Foods that have a lot of magnesium in them include: ? Green leafy vegetables, such as spinach and broccoli. ? Beans and peas. ? Nuts and seeds, such as almonds and sunflower seeds. ? Whole grains, such as whole grain bread and fortified cereals.  Take magnesium supplements if your health care provider tells you to do that. Take them as directed.  Take over-the-counter and prescription medicines only as told by your health care provider.  Have your magnesium levels monitored as told by your health care provider.  When you are active, drink fluids that contain electrolytes.  Avoid drinking alcohol.  Keep all follow-up visits as told by your health care provider. This is important. Contact a health care provider if:  You get worse instead of better.  Your symptoms return. Get help right away if  you:  Develop severe muscle weakness.  Have trouble breathing.  Feel that your heart is racing. Summary  Hypomagnesemia is a condition in which the level of magnesium in the blood is low.  Hypomagnesemia can affect every organ in the body.  Treatment may include eating more foods that contain magnesium, taking magnesium supplements, and not drinking alcohol.  Have your magnesium levels monitored as told by your health care provider. This information is not intended to replace advice given to you by your health care provider. Make sure you discuss any questions you have with your health care provider. Document Released: 12/04/2004 Document Revised: 02/09/2017 Document Reviewed: 02/09/2017 Elsevier Interactive Patient Education  2019 [employer].

## 2018-05-10 NOTE — Telephone Encounter (Signed)
Scheduled appt per 02/17.  Printed calendar and avs.

## 2018-05-10 NOTE — Progress Notes (Signed)
DISCONTINUE ON PATHWAY REGIMEN - Colorectal     A cycle is every 14 days:     Panitumumab      Oxaliplatin      Leucovorin      5-Fluorouracil      5-Fluorouracil   **Always confirm dose/schedule in your pharmacy ordering system**  REASON: Disease Progression PRIOR TREATMENT: COS75: mFOLFOX + Panitumumab q14 Days TREATMENT RESPONSE: Progressive Disease (PD)  START ON PATHWAY REGIMEN - Colorectal     A cycle is every 21 days:     Capecitabine      Oxaliplatin      Bevacizumab-xxxx   **Always confirm dose/schedule in your pharmacy ordering system**  Patient Characteristics: Distant Metastases, Second Line, KRAS/NRAS Wild-Type, BRAF Wild-Type/Unknown, Prior Anti-EGFR Therapy, Bevacizumab Eligible Therapeutic Status: Distant Metastases BRAF Mutation Status: Wild-Type (no mutation) KRAS/NRAS Mutation Status: Wild-Type (no mutation) Line of Therapy: Second Line  Intent of Therapy: Non-Curative / Palliative Intent, Discussed with Patient

## 2018-05-12 LAB — IRON AND TIBC
Iron: 86 ug/dL (ref 41–142)
Saturation Ratios: 23 % (ref 21–57)
TIBC: 376 ug/dL (ref 236–444)
UIBC: 291 ug/dL (ref 120–384)

## 2018-05-12 LAB — FERRITIN: Ferritin: 10 ng/mL — ABNORMAL LOW (ref 11–307)

## 2018-05-17 ENCOUNTER — Inpatient Hospital Stay: Payer: BLUE CROSS/BLUE SHIELD

## 2018-05-17 ENCOUNTER — Other Ambulatory Visit: Payer: Self-pay | Admitting: *Deleted

## 2018-05-17 ENCOUNTER — Other Ambulatory Visit: Payer: Self-pay | Admitting: Hematology

## 2018-05-17 DIAGNOSIS — C19 Malignant neoplasm of rectosigmoid junction: Secondary | ICD-10-CM

## 2018-05-17 DIAGNOSIS — C187 Malignant neoplasm of sigmoid colon: Secondary | ICD-10-CM

## 2018-05-17 DIAGNOSIS — D500 Iron deficiency anemia secondary to blood loss (chronic): Secondary | ICD-10-CM

## 2018-05-17 DIAGNOSIS — Z7189 Other specified counseling: Secondary | ICD-10-CM

## 2018-05-17 LAB — CBC WITH DIFFERENTIAL (CANCER CENTER ONLY)
Abs Immature Granulocytes: 0.03 10*3/uL (ref 0.00–0.07)
Basophils Absolute: 0.1 10*3/uL (ref 0.0–0.1)
Basophils Relative: 1 %
Eosinophils Absolute: 0.8 10*3/uL — ABNORMAL HIGH (ref 0.0–0.5)
Eosinophils Relative: 9 %
HCT: 41.6 % (ref 36.0–46.0)
Hemoglobin: 13.5 g/dL (ref 12.0–15.0)
Immature Granulocytes: 0 %
Lymphocytes Relative: 20 %
Lymphs Abs: 1.8 10*3/uL (ref 0.7–4.0)
MCH: 29.7 pg (ref 26.0–34.0)
MCHC: 32.5 g/dL (ref 30.0–36.0)
MCV: 91.6 fL (ref 80.0–100.0)
Monocytes Absolute: 0.6 10*3/uL (ref 0.1–1.0)
Monocytes Relative: 6 %
Neutro Abs: 6.1 10*3/uL (ref 1.7–7.7)
Neutrophils Relative %: 64 %
Platelet Count: 217 10*3/uL (ref 150–400)
RBC: 4.54 MIL/uL (ref 3.87–5.11)
RDW: 15.9 % — ABNORMAL HIGH (ref 11.5–15.5)
WBC Count: 9.4 10*3/uL (ref 4.0–10.5)
nRBC: 0 % (ref 0.0–0.2)

## 2018-05-17 LAB — CMP (CANCER CENTER ONLY)
ALT: 12 U/L (ref 10–47)
AST: 19 U/L (ref 11–38)
Albumin: 3.4 g/dL — ABNORMAL LOW (ref 3.5–5.0)
Alkaline Phosphatase: 164 U/L — ABNORMAL HIGH (ref 38–126)
Anion gap: 13 (ref 5–15)
BUN: 7 mg/dL (ref 6–20)
CO2: 25 mmol/L (ref 22–32)
Calcium: 7.9 mg/dL — ABNORMAL LOW (ref 8.9–10.3)
Chloride: 105 mmol/L (ref 98–111)
Creatinine: 0.8 mg/dL (ref 0.60–1.20)
GFR, Est AFR Am: >60 mL/min (ref >60)
GFR, Estimated: >60 mL/min (ref >60)
Glucose, Bld: 131 mg/dL — ABNORMAL HIGH (ref 70–99)
Potassium: 3.5 mmol/L (ref 3.5–5.1)
Sodium: 143 mmol/L (ref 135–145)
Total Bilirubin: 0.5 mg/dL (ref 0.2–1.6)
Total Protein: 7.6 g/dL (ref 6.5–8.1)

## 2018-05-17 LAB — MAGNESIUM: Magnesium: 1.4 mg/dL — CL (ref 1.7–2.4)

## 2018-05-17 LAB — CEA (IN HOUSE-CHCC): CEA (CHCC-In House): 114.25 ng/mL — ABNORMAL HIGH (ref 0.00–5.00)

## 2018-05-17 LAB — TOTAL PROTEIN, URINE DIPSTICK: Protein, ur: NEGATIVE mg/dL

## 2018-05-17 MED ORDER — DEXAMETHASONE SODIUM PHOSPHATE 10 MG/ML IJ SOLN
INTRAMUSCULAR | Status: AC
  Filled 2018-05-17: qty 1

## 2018-05-17 MED ORDER — DEXTROSE 5 % IV SOLN
Freq: Once | INTRAVENOUS | Status: AC
  Administered 2018-05-17: 15:00:00 via INTRAVENOUS
  Filled 2018-05-17: qty 250

## 2018-05-17 MED ORDER — MAGNESIUM SULFATE 50 % IJ SOLN
Freq: Once | INTRAVENOUS | Status: AC
  Administered 2018-05-17: 16:00:00 via INTRAVENOUS
  Filled 2018-05-17: qty 100

## 2018-05-17 MED ORDER — OXALIPLATIN CHEMO INJECTION 100 MG/20ML
100.0000 mg/m2 | Freq: Once | INTRAVENOUS | Status: AC
  Administered 2018-05-17: 185 mg via INTRAVENOUS
  Filled 2018-05-17: qty 37

## 2018-05-17 MED ORDER — SODIUM CHLORIDE 0.9 % IV SOLN
10.0000 mg | Freq: Once | INTRAVENOUS | Status: DC
  Filled 2018-05-17: qty 1

## 2018-05-17 MED ORDER — PALONOSETRON HCL INJECTION 0.25 MG/5ML
INTRAVENOUS | Status: AC
  Filled 2018-05-17: qty 5

## 2018-05-17 MED ORDER — PALONOSETRON HCL INJECTION 0.25 MG/5ML
0.2500 mg | Freq: Once | INTRAVENOUS | Status: AC
  Administered 2018-05-17: 0.25 mg via INTRAVENOUS

## 2018-05-17 MED ORDER — PROCHLORPERAZINE MALEATE 10 MG PO TABS: 10.0000 mg | ORAL_TABLET | Freq: Four times a day (QID) | ORAL | 1 refills | Status: DC | PRN

## 2018-05-17 MED ORDER — SODIUM CHLORIDE 0.9 % IV SOLN
7.5000 mg/kg | Freq: Once | INTRAVENOUS | Status: AC
  Administered 2018-05-17: 550 mg via INTRAVENOUS
  Filled 2018-05-17: qty 16

## 2018-05-17 MED ORDER — DEXAMETHASONE SODIUM PHOSPHATE 10 MG/ML IJ SOLN
10.0000 mg | Freq: Once | INTRAMUSCULAR | Status: AC
  Administered 2018-05-17: 10 mg via INTRAVENOUS

## 2018-05-17 MED FILL — PROCHLORPERAZINE 10 MG TAB: 10 | 7 days supply | Qty: 30 | Fill #0

## 2018-05-17 NOTE — Progress Notes (Signed)
At chairside instructing pt to continue to  take 2 tablet of vitamin D daily, Mag 2 tablets daily, pt rash is still improving, Rx for Compazine sent to [facility] . CBC and CMEtt reviewed with MD, ok to treat despite labs.

## 2018-05-19 ENCOUNTER — Other Ambulatory Visit: Payer: Self-pay

## 2018-05-19 ENCOUNTER — Ambulatory Visit: Payer: Self-pay

## 2018-05-19 ENCOUNTER — Ambulatory Visit: Payer: Self-pay | Admitting: Hematology

## 2018-05-19 ENCOUNTER — Telehealth: Payer: Self-pay | Admitting: General Practice

## 2018-05-19 ENCOUNTER — Inpatient Hospital Stay: Payer: BLUE CROSS/BLUE SHIELD | Admitting: General Practice

## 2018-05-19 ENCOUNTER — Encounter: Payer: Self-pay | Admitting: Pharmacy Technician

## 2018-05-19 ENCOUNTER — Other Ambulatory Visit: Payer: Self-pay | Admitting: Hematology

## 2018-05-19 DIAGNOSIS — C19 Malignant neoplasm of rectosigmoid junction: Secondary | ICD-10-CM

## 2018-05-19 NOTE — Progress Notes (Signed)
The patient has insurance coverage as of 03/24/18.  The final drug replacement is for DOS 04/21/18.  All further invoices should be submitted to [facility].

## 2018-05-19 NOTE — Telephone Encounter (Signed)
[facility] CSW Progress Notes  Call to patient - appointment rescheduled as she is not feeling well after new chemo regimen.  Asked patient to call CSW when she feels better and appt can be rescheduled.  Edwyna Shell, LCSW Clinical Social Worker Phone:  [phone]

## 2018-05-21 ENCOUNTER — Telehealth: Payer: Self-pay | Admitting: Pharmacist

## 2018-05-21 DIAGNOSIS — C19 Malignant neoplasm of rectosigmoid junction: Secondary | ICD-10-CM

## 2018-05-21 MED ORDER — CAPECITABINE 500 MG PO TABS: 1500.0000 mg | ORAL_TABLET | Freq: Two times a day (BID) | ORAL | 0 refills | Status: DC

## 2018-05-21 MED ORDER — CAPECITABINE 500 MG PO TABS: 1500.0000 mg | ORAL_TABLET | Freq: Two times a day (BID) | ORAL | 1 refills | Status: DC

## 2018-05-21 NOTE — Progress Notes (Signed)
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 Abdominopelvic adenopathy. Rectosigmoid mass with suggestion of partial obstruction as evidenced by large colonic stool burden more proximally. 2.  Aortic Atherosclerosis (ICD10-I70.0).  This is age advanced.    05/31/2017 Tumor Marker    CEA 353.3 (elevated) AFP 4.5 (normal)    05/31/2017 Initial Biopsy    Diagnosis Colon, biopsy, sigmoid - INVASIVE ADENOCARCINOMA - SEE COMMENT    05/31/2017 Imaging    CT CHEST IMPRESSION: 1. No evidence of metastatic disease in the chest. 2. No acute findings.  Aortic Atherosclerosis (ICD10-I70.0).     05/31/2017 Procedure    Colonoscopy per Dr. Watt Climes Findings: An infiltrative and ulcerated partially obstructing medium-sized mass was found in the recto-sigmoid colon. The mass was circumferential. The mass measured four cm in length. No bleeding was present.   Impression - One small polyp in the rectum. - The examination was otherwise normal. - Malignant partially obstructing tumor in the  recto-sigmoid colon. Biopsied. - One medium polyp in the proximal sigmoid colon. - The examination was otherwise normal. - Internal hemorrhoids.    06/03/2017 Initial Diagnosis    Malignant neoplasm of transverse colon ([place])    06/11/2017 Cancer Staging    Staging form: Colon and Rectum, AJCC 8th Edition - Clinical stage from 06/11/2017: Stage IVA (cTX, cN1b, pM1a) - Signed by Truitt Merle, MD on 06/15/2017    06/11/2017 Pathology Results    Liver biopsy confirmed metastatic colon cancer    07/23/2017 Imaging    CT CAP IMPRESSION: 1. Mild progression of hepatic metastasis. 2. Similar rectosigmoid primary with abdominopelvic nodal metastasis. 3.  No acute process or evidence of metastatic disease in the chest. 4. Aortic Atherosclerosis (ICD10-I70.0). Coronary artery atherosclerosis. This is age advanced. 5. Proximal colonic constipation, again suggesting a component of partial obstruction at the primary site. 6. Mild ascending aortic dilatation at 4.1 cm.    08/25/2017 - 05/10/2018 Chemotherapy    -Xeloda '1500mg'$  BID 1 week on and 1 week off starting 08/25/17. Due to her worsening hand-foot syndrome her dose was reduced to '1500mg'$  in the AM and '1000mg'$  in the PM. Due to disease progression we swithced her to CAPOX  -Vectibix every 2 weeks starting 08/25/17. Due to skin rash will stop starting 05/10/18.      12/14/2017 Imaging    IMPRESSION: 1. Response to therapy. Marked decrease in hepatic metastatic burden. More mild decrease in abdominopelvic adenopathy and definition of rectosigmoid primary. 2.  No acute process or evidence of metastatic disease in the chest. 3.  Aortic Atherosclerosis (ICD10-I70.0).     04/28/2018 Imaging    CT CAP 04/28/18  IMPRESSION: 1. Index  liver metastases measured on the previous study show no substantial interval change although new liver lesions on today's exam are concerning for progressive disease. 2. Mild lymphadenopathy in the upper abdomen is stable. 3.  Persistent mild ill-defined wall thickening in the distal sigmoid colon near the rectosigmoid junction. 4.  Aortic Atherosclerois (ICD10-170.0)     05/17/2018 -  Chemotherapy    CAPOX every 3 weeks with Xeloda '1500mg'$  BID 2 weeks on/1week off starting 05/17/18 -Add Avastin to first cycle.       CURRENT THERAPY:  Second line CAPOX and Avantin every 3 weeks with Xeloda '1500mg'$  BID 2 weeks on/1 week off starting 05/17/18  INTERVAL HISTORY:  Beverly Schwartz is here for a follow up and ongoing treatment. She presents to the clinic today with her mother. She notes she is doing well overall. She notes she moderately tolerated first cycle with hand muscle spasms, nausea and fatigue. These symptoms lasted about 3 days. She notes she still has compazine and Zofran.  She notes today she has RUQ pain suddenly in clinic today. She notes she ate cooking this morning and her BG was 248.      REVIEW OF SYSTEMS:   Constitutional: Denies fevers, chills or abnormal weight loss Eyes: Denies blurriness of vision Ears, nose, mouth, throat, and face: Denies mucositis or sore throat Respiratory: Denies cough, dyspnea or wheezes Cardiovascular: Denies palpitation, chest discomfort or lower extremity swelling Gastrointestinal:  Denies nausea, heartburn or change in bowel habits (+) RUQ pain  Skin: Denies abnormal skin rashes Lymphatics: Denies new lymphadenopathy or easy bruising Neurological:Denies numbness, tingling or new weaknesses Behavioral/Psych: Mood is stable, no new changes  All other systems were reviewed with the patient and are negative.  MEDICAL HISTORY:  Past Medical History:  Diagnosis Date  . Anxiety   . Cancer ([place])    colon, liver  . Chest pain   . Colon cancer ([place])   . Complication of anesthesia   . Depression   . Diabetes mellitus   . Dizziness   . Family history of breast cancer   . Family history of stomach cancer   . Hyperlipidemia   . Hypertension   . Hypothyroidism     . Obesity   . PONV (postoperative nausea and vomiting)   . Tachycardia     SURGICAL HISTORY: Past Surgical History:  Procedure Laterality Date  . FLEXIBLE SIGMOIDOSCOPY N/A 05/31/2017   Procedure: FLEXIBLE SIGMOIDOSCOPY;  Surgeon: Clarene Essex, MD;  Location: [facility];  Service: Endoscopy;  Laterality: N/A;  . WISDOM TOOTH EXTRACTION     age 53's    I have reviewed the social history and family history with the patient and they are unchanged from previous note.  ALLERGIES:  is allergic to bupropion and amoxicillin.  MEDICATIONS:  Current Outpatient Medications  Medication Sig Dispense Refill  . acetaminophen-codeine (TYLENOL #3) 300-30 MG tablet Take 1 tablet by mouth every 6 (six) hours as needed for moderate pain. 30 tablet 0  . albuterol (PROVENTIL HFA;VENTOLIN HFA) 108 (90 Base) MCG/ACT inhaler Inhale 2 puffs into the lungs every 6 (six) hours as needed for wheezing or shortness of breath. 1 Inhaler 0  . ALPRAZolam (XANAX) 1 MG tablet Take 1 tablet (1 mg total) by mouth at bedtime as needed for anxiety. 30 tablet 0  . atorvastatin (LIPITOR) 10 MG tablet Take 1 tablet (10 mg total) by mouth daily. 90 tablet 3  . benzonatate (TESSALON) 100 MG capsule Take 1 capsule (100 mg total) by  mouth 3 (three) times daily as needed for cough. 20 capsule 0  . capecitabine (XELODA) 500 MG tablet Take 3 tablets (1,500 mg total) by mouth 2 (two) times daily after a meal. Take for 14 days on, 7 days off, repeat every 21 days. 84 tablet 1  . cetirizine (ZYRTEC ALLERGY) 10 MG tablet Take 1 tablet (10 mg total) by mouth daily. 30 tablet 1  . CINNAMON PO Take 1 tablet by mouth daily.     . clindamycin (CLINDAGEL) 1 % gel Apply topically 2 (two) times daily. 60 g 1  . fluticasone (FLONASE) 50 MCG/ACT nasal spray Place 1 spray into both nostrils daily. (Patient taking differently: Place 1 spray into both nostrils daily as needed for allergies. ) 16 g 2  . insulin NPH-regular Human (NOVOLIN 70/30)  (70-30) 100 UNIT/ML injection Inject 25 Units into the skin 2 (two) times daily with a meal.     . levothyroxine (SYNTHROID, LEVOTHROID) 112 MCG tablet Take 1 tablet (112 mcg total) by mouth daily before breakfast. 90 tablet 3  . magnesium oxide (MAG-OX) 400 (241.3 Mg) MG tablet Take 1 tablet (400 mg total) by mouth 2 (two) times daily. 60 tablet 1  . Omega-3 Fatty Acids (FISH OIL) 1000 MG CAPS Take 1,000 mg by mouth daily.     . polyethylene glycol (MIRALAX / GLYCOLAX) packet Take 17 g by mouth daily. 14 each 0  . prochlorperazine (COMPAZINE) 10 MG tablet Take 1 tablet (10 mg total) by mouth every 6 (six) hours as needed (NAUSEA). 30 tablet 1  . propranolol (INDERAL) 10 MG tablet TAKE 1 TABLET BY MOUTH EVERY DAY 90 tablet 1  . TURMERIC PO Take 1 capsule by mouth daily.     . urea (CARMOL) 10 % cream Apply topically as needed. 71 g 0   No current facility-administered medications for this visit.     PHYSICAL EXAMINATION: ECOG PERFORMANCE STATUS: 1 - Symptomatic but completely ambulatory  Vitals:   05/24/18 0954  BP: (!) 143/93  Pulse: 79  Resp: 18  Temp: 98.4 F (36.9 C)  SpO2: 99%   Filed Weights   05/24/18 0954  Weight: 160 lb 12.8 oz (72.9 kg)    GENERAL:alert, no distress and comfortable SKIN: skin color, texture, turgor are normal, no rashes or significant lesions EYES: normal, Conjunctiva are pink and non-injected, sclera clear OROPHARYNX:no exudate, no erythema and lips, buccal mucosa, and tongue normal  NECK: supple, thyroid normal size, non-tender, without nodularity LYMPH:  no palpable lymphadenopathy in the cervical, axillary or inguinal LUNGS: clear to auscultation and percussion with normal breathing effort HEART: regular rate & rhythm and no murmurs and no lower extremity edema ABDOMEN:abdomen soft and normal bowel sounds (+) Mild tenderness of RUQ  Musculoskeletal:no cyanosis of digits and no clubbing  NEURO: alert & oriented x 3 with fluent speech, no focal  motor/sensory deficits  LABORATORY DATA:  I have reviewed the data as listed CBC Latest Ref Rng & Units 05/24/2018 05/17/2018 05/10/2018  WBC 4.0 - 10.5 K/uL 8.3 9.4 8.8  Hemoglobin 12.0 - 15.0 g/dL 12.3 13.5 13.6  Hematocrit 36.0 - 46.0 % 37.1 41.6 41.8  Platelets 150 - 400 K/uL 176 217 228     CMP Latest Ref Rng & Units 05/24/2018 05/17/2018 05/10/2018  Glucose 70 - 99 mg/dL 248(H) 131(H) 195(H)  BUN 6 - 20 mg/dL 7 7 <4(L)  Creatinine 0.44 - 1.00 mg/dL 0.77 0.80 0.65  Sodium 135 - 145 mmol/L 140 143 140  Potassium  3.5 - 5.1 mmol/L 3.8 3.5 3.4(L)  Chloride 98 - 111 mmol/L 103 105 104  CO2 22 - 32 mmol/L '26 25 24  '$ Calcium 8.9 - 10.3 mg/dL 7.2(L) 7.9(L) 6.1(LL)  Total Protein 6.5 - 8.1 g/dL 7.0 7.6 7.1  Total Bilirubin 0.3 - 1.2 mg/dL 0.5 0.5 0.7  Alkaline Phos 38 - 126 U/L 167(H) 164(H) 155(H)  AST 15 - 41 U/L '15 19 29  '$ ALT 0 - 44 U/L '9 12 14      '$ RADIOGRAPHIC STUDIES: I have personally reviewed the radiological images as listed and agreed with the findings in the report. No results found.   ASSESSMENT & PLAN:  Beverly Schwartz is a 48 y.o. female with   1. Sigmoid adenocarcinoma, with abdominopelvic adenopathy andhepatic metastasis, stage IV, MSI-stable , KRAS/NRAS/BRAF wild type -Diagnosed in 05/2017. Treated withfirst-line chemotherapyXelodaandVectibix. Patient declined intensive chemo regiments previously due to fear of side effects. She was recently on single agent Xeloda '1500mg'$  BID and Vectibix. Unfortunately she had mild disease progression.  -she is currently on second line CAPOX and Avastin, she does not want a port or the infusion pump at this point.   -She started CAPOX and Avastin on 05/17/18. She is tolerating first cycle moderately well with fatigue, nausea, and muscle cramps in her hands for 3 days, much improved now. I encouraged her to increase compazine and Zofran for the first few days.  -Labs reviewed, CBC and CMP WNL except BG at 248, Ca at 7.2, Albumin at  2.8, Alk Phos at 167 and Mag at 1.5.  -F/u in 2 weeks with cycle 2.   2.  Hypokalemia, hypo-magnesium anemia, hypocalcemia -Probably secondary chemotherapy, especially Vectibix -Potassium normal, Mag at 1.5, Calcium at 7.2, albumin at 2.8 today (05/24/18).  -I again strongly encouraged her to take oral K, Mag and Calcium    3. DM, HTN, HL -f/u with PCP  4. Financial and Social issues, Anxiety, depression -Shefollows up as needed withSocial Worker Hollice Espy -She is currently on Xanax  -Shehas regained insurancewithBlue cross insurancethis year  5.Goal of care discussion  -She understands the goal of care is palliative, her cancer is incurable. The goal of therapy is to prolong her life and improve her quality of life. -She is full code for now   6. Rash on face, chest, scalp and back, moderate  -secondary to Panitumumab -She will continue using hydrocortisone cream and clindamycin gel twice daily -Stable, worse under her nose. I advised her to avoid makeup.  -Will stop Vectibix starting 05/10/18 and Skin toxicity much improved   7. Hand-foot syndrome, secondary to Xeloda   -She developed mild hand-foot syndrome, secondary to Xeloda.This has much improved with a dose reduction of Xeloda. -She will continue to use Urea cream -Stable, slight improvement.    Plan -Labs reviewed and adequate to continue Xeloda to complete cycle 1 CAPOX  -Lab, f/u and Oxaliplatin in 2 weeks, will give full dose oxaliplatin on cycle 2  No problem-specific Assessment & Plan notes found for this encounter.   No orders of the defined types were placed in this encounter.  All questions were answered. The patient knows to call the clinic with any problems, questions or concerns. No barriers to learning was detected. I spent 20 minutes counseling the patient face to face. The total time spent in the appointment was 25 minutes and more than 50% was on counseling and review of test  results     Truitt Merle, MD 05/24/2018  Oneal Deputy, am acting as scribe for Truitt Merle, MD.   I have reviewed the above documentation for accuracy and completeness, and I agree with the above.

## 2018-05-21 NOTE — Telephone Encounter (Signed)
Oral Chemotherapy Pharmacist Encounter   Attempted to reach patient to provide update and offer for counseling on oral medication: Xeloda.  No answer. Left VM for patient to call back.   Johny Drilling, PharmD, BCPS, BCOP  05/21/2018   3:23 PM Oral Oncology Clinic [phone]

## 2018-05-21 NOTE — Telephone Encounter (Signed)
Oral Oncology Pharmacist Encounter  Insurance authorization for Xeloda (capecitabine) is required by [facility] prescription insurance Authorization has been submitted on Cover My Meds  Key: [id number] Status is pending  This encounter will continue to be updated until final determination.  Johny Drilling, PharmD, BCPS, BCOP  05/21/2018 2:29 PM Oral Oncology Clinic [phone]

## 2018-05-21 NOTE — Telephone Encounter (Signed)
Oral Oncology Pharmacist Encounter  Received new prescription for Xeloda (capecitabine) for the treatment of progressive, metastatic, Kras/Nras WT rectal cancer in conjunction with infusional oxaliplatin and Avastin, planned duration until disease progression or unacceptable toxicity.  Patient previously on treatment with Xeloda and infusional Vectibix 08/25/2017-05/10/2018, patient is now under evaluation to change therapy to CapeOx with Avastin due to mixed response to therapy Patient had previously experienced dose reductions in her Xeloda due to continued skin toxicity  Xeloda is planned to be dosed at ~815 mg/m2 by mouth twice daily for 14 days on, 7 days off, repeated every 21 days Oxaliplatin is planned to be dosed at 100 mg/m2 (dose reduced for increased tolerance) administered IV every 3 weeks Avastin is planned to be dosed at 7.5 mg/kg IV every 3 weeks  Patient has somewhat of a home supply of Xeloda left over from previous treatment regimen.  Labs from 05/17/2018 assessed, okay for treatment.  Current medication list in Epic reviewed, no DDIs with Xeloda identified.  Prescription with updated directions and quantity # has been e-scribed to the [facility] for benefits analysis and approval.  She had been receiving Xeloda from the [facility] at reduced [employer] when she was uninsured. Noted patient with new insurance coverage in 2020.  Oral Oncology Clinic will continue to follow for insurance authorization, copayment issues, initial counseling and start date.  Beverly Schwartz, PharmD, BCPS, BCOP  05/21/2018 1:53 PM Oral Oncology Clinic [phone]

## 2018-05-24 ENCOUNTER — Inpatient Hospital Stay (HOSPITAL_BASED_OUTPATIENT_CLINIC_OR_DEPARTMENT_OTHER): Payer: BLUE CROSS/BLUE SHIELD | Admitting: Hematology

## 2018-05-24 ENCOUNTER — Inpatient Hospital Stay: Payer: BLUE CROSS/BLUE SHIELD | Attending: Hematology

## 2018-05-24 ENCOUNTER — Other Ambulatory Visit: Payer: Self-pay

## 2018-05-24 ENCOUNTER — Telehealth: Payer: Self-pay | Admitting: Hematology

## 2018-05-24 VITALS — BP 143/93 | HR 79 | Temp 98.4°F | Resp 18 | Ht 65.0 in | Wt 160.8 lb

## 2018-05-24 DIAGNOSIS — Z79899 Other long term (current) drug therapy: Secondary | ICD-10-CM | POA: Insufficient documentation

## 2018-05-24 DIAGNOSIS — I10 Essential (primary) hypertension: Secondary | ICD-10-CM

## 2018-05-24 DIAGNOSIS — Z5112 Encounter for antineoplastic immunotherapy: Secondary | ICD-10-CM | POA: Diagnosis present

## 2018-05-24 DIAGNOSIS — G620 Drug-induced polyneuropathy: Secondary | ICD-10-CM

## 2018-05-24 DIAGNOSIS — E039 Hypothyroidism, unspecified: Secondary | ICD-10-CM | POA: Diagnosis not present

## 2018-05-24 DIAGNOSIS — E119 Type 2 diabetes mellitus without complications: Secondary | ICD-10-CM | POA: Insufficient documentation

## 2018-05-24 DIAGNOSIS — C787 Secondary malignant neoplasm of liver and intrahepatic bile duct: Secondary | ICD-10-CM

## 2018-05-24 DIAGNOSIS — E876 Hypokalemia: Secondary | ICD-10-CM

## 2018-05-24 DIAGNOSIS — C19 Malignant neoplasm of rectosigmoid junction: Secondary | ICD-10-CM | POA: Diagnosis not present

## 2018-05-24 DIAGNOSIS — C187 Malignant neoplasm of sigmoid colon: Secondary | ICD-10-CM

## 2018-05-24 DIAGNOSIS — D500 Iron deficiency anemia secondary to blood loss (chronic): Secondary | ICD-10-CM

## 2018-05-24 DIAGNOSIS — Z5111 Encounter for antineoplastic chemotherapy: Secondary | ICD-10-CM | POA: Diagnosis not present

## 2018-05-24 DIAGNOSIS — R21 Rash and other nonspecific skin eruption: Secondary | ICD-10-CM

## 2018-05-24 LAB — CBC WITH DIFFERENTIAL (CANCER CENTER ONLY)
Abs Immature Granulocytes: 0.05 10*3/uL (ref 0.00–0.07)
Basophils Absolute: 0.1 10*3/uL (ref 0.0–0.1)
Basophils Relative: 1 %
Eosinophils Absolute: 0.4 10*3/uL (ref 0.0–0.5)
Eosinophils Relative: 5 %
HCT: 37.1 % (ref 36.0–46.0)
Hemoglobin: 12.3 g/dL (ref 12.0–15.0)
Immature Granulocytes: 1 %
Lymphocytes Relative: 23 %
Lymphs Abs: 1.9 10*3/uL (ref 0.7–4.0)
MCH: 30.4 pg (ref 26.0–34.0)
MCHC: 33.2 g/dL (ref 30.0–36.0)
MCV: 91.8 fL (ref 80.0–100.0)
Monocytes Absolute: 0.6 10*3/uL (ref 0.1–1.0)
Monocytes Relative: 7 %
Neutro Abs: 5.3 10*3/uL (ref 1.7–7.7)
Neutrophils Relative %: 63 %
Platelet Count: 176 10*3/uL (ref 150–400)
RBC: 4.04 MIL/uL (ref 3.87–5.11)
RDW: 15.3 % (ref 11.5–15.5)
WBC Count: 8.3 10*3/uL (ref 4.0–10.5)
nRBC: 0 % (ref 0.0–0.2)

## 2018-05-24 LAB — CMP (CANCER CENTER ONLY)
ALT: 9 U/L (ref 0–44)
AST: 15 U/L (ref 15–41)
Albumin: 2.8 g/dL — ABNORMAL LOW (ref 3.5–5.0)
Alkaline Phosphatase: 167 U/L — ABNORMAL HIGH (ref 38–126)
Anion gap: 11 (ref 5–15)
BUN: 7 mg/dL (ref 6–20)
CO2: 26 mmol/L (ref 22–32)
Calcium: 7.2 mg/dL — ABNORMAL LOW (ref 8.9–10.3)
Chloride: 103 mmol/L (ref 98–111)
Creatinine: 0.77 mg/dL (ref 0.44–1.00)
GFR, Est AFR Am: >60 mL/min (ref >60)
GFR, Estimated: >60 mL/min (ref >60)
Glucose, Bld: 248 mg/dL — ABNORMAL HIGH (ref 70–99)
Potassium: 3.8 mmol/L (ref 3.5–5.1)
Sodium: 140 mmol/L (ref 135–145)
Total Bilirubin: 0.5 mg/dL (ref 0.3–1.2)
Total Protein: 7 g/dL (ref 6.5–8.1)

## 2018-05-24 LAB — MAGNESIUM: Magnesium: 1.5 mg/dL — ABNORMAL LOW (ref 1.7–2.4)

## 2018-05-24 MED FILL — CAPECITABINE 500 MG TABS: 500 | 21 days supply | Qty: 84 | Fill #0

## 2018-05-24 NOTE — Telephone Encounter (Signed)
Oral Chemotherapy Pharmacist Encounter   Attempted to reach patient to provide update and offer for counseling on oral medication: Xeloda.  No answer. Left VM for patient to call back.   Johny Drilling, PharmD, BCPS, BCOP  05/24/2018  10:23 AM  Oral Oncology Clinic [phone]

## 2018-05-24 NOTE — Telephone Encounter (Signed)
No los per 3/2. °

## 2018-05-24 NOTE — Telephone Encounter (Signed)
Oral Oncology Patient Advocate Encounter  Prior Authorization for Xeloda has been approved.    PA# [id number] Effective dates: 05/21/18 through 05/21/19  Oral Oncology Clinic will continue to follow.   [place] [facility] [place] Phone [phone] Fax [phone] 05/24/2018    8:23 AM

## 2018-05-25 ENCOUNTER — Encounter: Payer: Self-pay | Admitting: Hematology

## 2018-05-26 NOTE — Telephone Encounter (Signed)
Oral Chemotherapy Pharmacist Encounter   I spoke with patient for overview of: Xeloda (capecitabine) for the treatment of progressive, metastatic, Kras/Nras WT rectal cancer in conjunction with infusional oxaliplatin and Avastin, planned duration until disease progression or unacceptable toxicity.   Counseled patient on administration, dosing, side effects, monitoring, drug-food interactions, safe handling, storage, and disposal.  Patient is taking Xeloda 541m tablets, 3 tablets (15031m by mouth in AM and 3 tabs (150026mby mouth in PM, within 30 minutes of finishing meals, on days 1-14 of each 21 day cycle.   Oxaliplatin is being infused on day 1 of each 21 day cycle.  Xeloda and oxaliplatin start date: 05/17/18  Adverse effects include but are not limited to: fatigue, decreased blood counts, GI upset, diarrhea, mouth sores, and hand-foot syndrome.  Patient has anti-emetic on hand and is taking it when nausea develops.  It did help remit some nausea effects experienced after 1st infusion. Patient will obtain anti diarrheal and alert the office of 4 or more loose stools above baseline.  Reviewed with patient importance of keeping a medication schedule and plan for any missed doses.  Medication reconciliation performed and medication/allergy list updated.  Patient states she experienced cold sensitivity and paresthesia after cycle 1 that lasted ~6 days. MD aware. Patient experienced nausea x 3 days and decreased appetite x 5 days after cycle 1. She will continue oral anti-emetics when needed.  She states her skin has completely cleared up from dermatologic toxicities associated with previous Vectibix administration.  Patient has a dentist appointment scheduled for 06/21/18. MD has been notified.  [facility] for fluids, administrations, and office visits that accrued during her time uninsured. Patient provided phone number to financial  counselor to assist with understanding her bills.  Patient has already [place] per fill on her new prescription insurance coverage.  All questions answered.  Ms. KniSteppiced understanding and appreciation.   Patient knows to call the office with questions or concerns. Oral Oncology Clinic will continue to follow.  JesJohny DrillingharmD, BCPS, BCOP  05/26/2018   3:04 PM Oral Oncology Clinic [phone]

## 2018-06-02 ENCOUNTER — Other Ambulatory Visit: Payer: Self-pay

## 2018-06-02 ENCOUNTER — Ambulatory Visit: Payer: Self-pay | Admitting: Hematology

## 2018-06-02 ENCOUNTER — Ambulatory Visit: Payer: Self-pay

## 2018-06-02 NOTE — Progress Notes (Signed)
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)   05/30/2017 Imaging    CT AP IMPRESSION: 1. 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 Abdominopelvic adenopathy. Rectosigmoid mass with suggestion of partial obstruction as evidenced by large colonic stool burden more proximally. 2.  Aortic Atherosclerosis (ICD10-I70.0).  This is age advanced.    05/31/2017 Tumor Marker    CEA 353.3 (elevated) AFP 4.5 (normal)    05/31/2017 Initial Biopsy    Diagnosis Colon, biopsy, sigmoid - INVASIVE ADENOCARCINOMA - SEE COMMENT    05/31/2017 Imaging    CT CHEST IMPRESSION: 1. No evidence of metastatic disease in the chest. 2. No acute findings.  Aortic Atherosclerosis (ICD10-I70.0).     05/31/2017 Procedure    Colonoscopy per Dr. Watt Climes Findings: An infiltrative and ulcerated partially obstructing medium-sized mass was found in the recto-sigmoid colon. The mass was circumferential. The mass measured four cm in length. No bleeding was present.   Impression - One small polyp in the rectum. - The examination was otherwise normal. - Malignant partially obstructing tumor in  the recto-sigmoid colon. Biopsied. - One medium polyp in the proximal sigmoid colon. - The examination was otherwise normal. - Internal hemorrhoids.    06/03/2017 Initial Diagnosis    Malignant neoplasm of transverse colon ([place])    06/11/2017 Cancer Staging    Staging form: Colon and Rectum, AJCC 8th Edition - Clinical stage from 06/11/2017: Stage IVA (cTX, cN1b, pM1a) - Signed by Truitt Merle, MD on 06/15/2017    06/11/2017 Pathology Results    Liver biopsy confirmed metastatic colon cancer    07/23/2017 Imaging    CT CAP IMPRESSION: 1. Mild progression of hepatic metastasis. 2. Similar rectosigmoid primary with abdominopelvic nodal metastasis. 3.  No acute process or evidence of metastatic disease in the chest. 4. Aortic Atherosclerosis (ICD10-I70.0). Coronary artery atherosclerosis. This is age advanced. 5. Proximal colonic constipation, again suggesting a component of partial obstruction at the primary site. 6. Mild ascending aortic dilatation at 4.1 cm.    08/25/2017 - 05/10/2018 Chemotherapy    -Xeloda '1500mg'$  BID 1 week on and 1 week off starting 08/25/17. Due to her worsening hand-foot syndrome her dose was reduced to '1500mg'$  in the AM and '1000mg'$  in the PM. Due to disease progression we swithced her to CAPOX  -Vectibix every 2 weeks starting 08/25/17. Due to skin rash will stop starting 05/10/18.      12/14/2017 Imaging    IMPRESSION: 1. Response to therapy. Marked decrease in hepatic metastatic burden. More mild decrease in abdominopelvic adenopathy and definition of rectosigmoid primary. 2.  No acute process or evidence of metastatic disease in the chest. 3.  Aortic Atherosclerosis (ICD10-I70.0).     04/28/2018 Imaging    CT CAP 04/28/18  IMPRESSION: 1. Index  liver metastases measured on the previous study show no substantial interval change although new liver lesions on today's exam are concerning for progressive disease. 2. Mild lymphadenopathy in the upper abdomen is stable. 3.  Persistent mild ill-defined wall thickening in the distal sigmoid colon near the rectosigmoid junction. 4.  Aortic Atherosclerois (ICD10-170.0)     05/17/2018 -  Chemotherapy    CAPOX every 3 weeks with Xeloda '1500mg'$  BID 2 weeks on/1week off starting 05/17/18 -Add Avastin to first cycle.       CURRENT THERAPY:  Second lineCAPOXand Avantin every 3 weeks with Xeloda '1500mg'$  BID 2 weeks on/1 week off starting 05/17/18  INTERVAL HISTORY:  Beverly Schwartz is here for a follow up of ongoing treatment. She presents to the clinic today with her mother. She notes her rash is gone. She notes her concern with COVID-19 and her cancer. She notes she has been taking Xanax as needed.  She notes she was nauseous first 4 days after treatment. She was also having hand spasms. She had adequate bowel movements and able to gain weight. She notes no issues with Xeloda but she felt more fatigued at end of 2 weeks and was able to recover.  She notes her insulin was 86 this morning. She notes she plans to have tooth filling procedure soon.    REVIEW OF SYSTEMS:   Constitutional: Denies fevers, chills or abnormal weight loss Eyes: Denies blurriness of vision Ears, nose, mouth, throat, and face: Denies mucositis or sore throat Respiratory: Denies cough, dyspnea or wheezes Cardiovascular: Denies palpitation, chest discomfort or lower extremity swelling Gastrointestinal:  Denies nausea, heartburn or change in bowel habits Skin: Denies abnormal skin rashes Lymphatics: Denies new lymphadenopathy or easy bruising Neurological:Denies numbness, tingling or new weaknesses Behavioral/Psych: Mood is stable, no new changes  All other systems were reviewed with the patient and are negative.  MEDICAL HISTORY:  Past Medical History:  Diagnosis Date  . Anxiety   . Cancer ([place])    colon, liver  . Chest pain   . Colon cancer ([place])   . Complication of anesthesia   . Depression   . Diabetes mellitus   . Dizziness    . Family history of breast cancer   . Family history of stomach cancer   . Hyperlipidemia   . Hypertension   . Hypothyroidism   . Obesity   . PONV (postoperative nausea and vomiting)   . Tachycardia     SURGICAL HISTORY: Past Surgical History:  Procedure Laterality Date  . FLEXIBLE SIGMOIDOSCOPY N/A 05/31/2017   Procedure: FLEXIBLE SIGMOIDOSCOPY;  Surgeon: Clarene Essex, MD;  Location: [facility] END[facility]COPY;  Service: Endoscopy;  Laterality: N/A;  . WISDOM TOOTH EXTRACTION     age 36's    I have reviewed the social history and family history with the patient and they are unchanged from previous note.  ALLERGIES:  is allergic to bupropion and amoxicillin.  MEDICATIONS:  Current Outpatient Medications  Medication Sig Dispense Refill  . acetaminophen-codeine (TYLENOL #3) 300-30 MG tablet Take 1 tablet by mouth every 6 (six) hours as needed for moderate pain. 30 tablet 0  . albuterol (PROVENTIL HFA;VENTOLIN HFA) 108 (90 Base) MCG/ACT inhaler Inhale 2 puffs into the lungs every 6 (six) hours as needed for wheezing or shortness of breath. 1 Inhaler 0  . ALPRAZolam (XANAX) 1 MG tablet Take 1 tablet (1 mg total) by mouth at bedtime as needed for anxiety. 30 tablet 0  . atorvastatin (LIPITOR) 10 MG tablet Take 1 tablet (  10 mg total) by mouth daily. 90 tablet 3  . benzonatate (TESSALON) 100 MG capsule Take 1 capsule (100 mg total) by mouth 3 (three) times daily as needed for cough. 20 capsule 0  . capecitabine (XELODA) 500 MG tablet Take 3 tablets (1,500 mg total) by mouth 2 (two) times daily after a meal. Take for 14 days on, 7 days off, repeat every 21 days. 84 tablet 1  . cetirizine (ZYRTEC ALLERGY) 10 MG tablet Take 1 tablet (10 mg total) by mouth daily. 30 tablet 1  . CINNAMON PO Take 1 tablet by mouth daily.     . clindamycin (CLINDAGEL) 1 % gel Apply topically 2 (two) times daily. 60 g 1  . fluticasone (FLONASE) 50 MCG/ACT nasal spray Place 1 spray into both nostrils daily. (Patient taking  differently: Place 1 spray into both nostrils daily as needed for allergies. ) 16 g 2  . insulin NPH-regular Human (NOVOLIN 70/30) (70-30) 100 UNIT/ML injection Inject 25 Units into the skin 2 (two) times daily with a meal.     . levothyroxine (SYNTHROID, LEVOTHROID) 112 MCG tablet Take 1 tablet (112 mcg total) by mouth daily before breakfast. 90 tablet 3  . magnesium oxide (MAG-OX) 400 (241.3 Mg) MG tablet Take 1 tablet (400 mg total) by mouth 2 (two) times daily. 60 tablet 1  . Omega-3 Fatty Acids (FISH OIL) 1000 MG CAPS Take 1,000 mg by mouth daily.     . polyethylene glycol (MIRALAX / GLYCOLAX) packet Take 17 g by mouth daily. 14 each 0  . prochlorperazine (COMPAZINE) 10 MG tablet Take 1 tablet (10 mg total) by mouth every 6 (six) hours as needed (NAUSEA). 30 tablet 1  . propranolol (INDERAL) 10 MG tablet TAKE 1 TABLET BY MOUTH EVERY DAY 90 tablet 1  . TURMERIC PO Take 1 capsule by mouth daily.     . urea (CARMOL) 10 % cream Apply topically as needed. 71 g 0   No current facility-administered medications for this visit.    Facility-Administered Medications Ordered in Other Visits  Medication Dose Route Frequency Provider Last Rate Last Dose  . oxaliplatin (ELOXATIN) 240 mg in dextrose 5 % 500 mL chemo infusion  130 mg/m2 (Treatment Plan Recorded) Intravenous Once Truitt Merle, MD 274 mL/hr at 06/07/18 1412 240 mg at 06/07/18 1412    PHYSICAL EXAMINATION: ECOG PERFORMANCE STATUS: 0 - Asymptomatic  Vitals:   06/07/18 1041  BP: (!) 148/90  Pulse: 88  Resp: 18  Temp: 98.4 F (36.9 C)  SpO2: 100%   Filed Weights   06/07/18 1041  Weight: 163 lb 14.4 oz (74.3 kg)    GENERAL:alert, no distress and comfortable SKIN: skin color, texture, turgor are normal, no rashes or significant lesions EYES: normal, Conjunctiva are pink and non-injected, sclera clear OROPHARYNX:no exudate, no erythema and lips, buccal mucosa, and tongue normal  NECK: supple, thyroid normal size, non-tender, without  nodularity LYMPH:  no palpable lymphadenopathy in the cervical, axillary or inguinal LUNGS: clear to auscultation and percussion with normal breathing effort HEART: regular rate & rhythm and no murmurs and no lower extremity edema ABDOMEN:abdomen soft, non-tender and normal bowel sounds Musculoskeletal:no cyanosis of digits and no clubbing  NEURO: alert & oriented x 3 with fluent speech, no focal motor/sensory deficits  LABORATORY DATA:  I have reviewed the data as listed CBC Latest Ref Rng & Units 06/07/2018 05/24/2018 05/17/2018  WBC 4.0 - 10.5 K/uL 6.6 8.3 9.4  Hemoglobin 12.0 - 15.0 g/dL 12.8 12.3 13.5  Hematocrit 36.0 - 46.0 % 39.1 37.1 41.6  Platelets 150 - 400 K/uL 262 176 217     CMP Latest Ref Rng & Units 06/07/2018 05/24/2018 05/17/2018  Glucose 70 - 99 mg/dL 126(H) 248(H) 131(H)  BUN 6 - 20 mg/dL '9 7 7  '$ Creatinine 0.44 - 1.00 mg/dL 0.77 0.77 0.80  Sodium 135 - 145 mmol/L 143 140 143  Potassium 3.5 - 5.1 mmol/L 3.7 3.8 3.5  Chloride 98 - 111 mmol/L 106 103 105  CO2 22 - 32 mmol/L '24 26 25  '$ Calcium 8.9 - 10.3 mg/dL 7.5(L) 7.2(L) 7.9(L)  Total Protein 6.5 - 8.1 g/dL 7.2 7.0 7.6  Total Bilirubin 0.3 - 1.2 mg/dL 0.5 0.5 0.5  Alkaline Phos 38 - 126 U/L 169(H) 167(H) 164(H)  AST 15 - 41 U/L '18 15 19  '$ ALT 0 - 44 U/L '14 9 12    '$ Mag 1.7 today   RADIOGRAPHIC STUDIES: I have personally reviewed the radiological images as listed and agreed with the findings in the report. No results found.   ASSESSMENT & PLAN:  DEZARAI PREW is a 48 y.o. female with   1. Sigmoid adenocarcinoma, with abdominopelvic adenopathy andhepatic metastasis, stage IV, MSI-stable , KRAS/NRAS/BRAF wild type -Diagnosed in 05/2017. Treated withfirst-line chemotherapyXelodaandVectibix and tolerated well. Patient declined intensive chemo regiments previously due to fear of side effects. Unfortunately she had mild disease progression after 8 months of therapy. -she is currently on second line CAPOX and  Avastin, she does not want a port or the infusion pump at this point.  -She started CAPOX and Avastin on 05/17/18. She tolerated the first cycle moderately well with fatigue, nausea, and muscle cramps in her hands for 3 days, much improved now. I encouraged her to increase her fluid intake, including Gatorade to help with cramping.  -Plan to scan her again in 07/2018.  -Labs reviewed, CBC overall WNL, CMP WNL except BG at 126, Ca at 7.5, Albumin at 3.3, Alk phos at 169, Mag at 1.7. Iron panel still pending. Overall adequate to proceed with Oxaliplatin and Avastin today. Given her good tolerance to first cycle, I will increase her oxaliplatin dose to full dose at '130mg'$ /m2 -Pt is concerned about recent COVID 19 outbreak. I discussed infection precautions and advised her to avoid unnecessary public venues. She should still be able to stay on treatment.  -F/u in 3 weeks before cycle 3   2.Hypokalemia, hypo-magnesium anemia, hypocalcemia -Probably secondary chemotherapy, especially Vectibix -Potassium normal, Mag at 1.7, Calcium at 7.7, albumin at 3.3 today (06/07/18).  -Continue oral K, Mag and Calcium.   3. DM, HTN, HL -f/u with PCP -On propranolol for HTN currently.  We discussed that Avastin can cause hypotension. -BP at 148/90 today (06/07/18). I encouraged her to monitor her BP at home. If elevated at home as well, I will prescribe Amlodipine to help better control her HTN.   4. Financial and Social issues, Anxiety, depression -Shefollows up as needed withSocial Worker Hollice Espy -She is currently on Xanax  -Shehas regained insurancewithBlue cross insurancethis year  5.Goal of care discussion  -She understands the goal of care is palliative, her cancer is incurable. The goal of therapy is to prolong her life and improve her quality of life. -She is full code for now   6. Hand-foot syndrome, secondary to Xeloda  -She developed mild hand-foot syndrome, secondary to  Xeloda.This has much improved with a dose reduction of Xeloda. -She will continue to use Urea cream -Stable, slight improvement.  Plan -Labs reviewed and adequate to proceed with Oxaliplatin and avastin today and continue Xeloda, will increase oxaliplatin dose to 130 mg/m for this cycle -Lab, f/u and chemo oxaliplatin and avastin in 3 weeks    No problem-specific Assessment & Plan notes found for this encounter.   No orders of the defined types were placed in this encounter.  All questions were answered. The patient knows to call the clinic with any problems, questions or concerns. No barriers to learning was detected. I spent 20 minutes counseling the patient face to face. The total time spent in the appointment was 25 minutes and more than 50% was on counseling and review of test results     Truitt Merle, MD 06/07/2018   I, Joslyn Devon, am acting as scribe for Truitt Merle, MD.   I have reviewed the above documentation for accuracy and completeness, and I agree with the above.

## 2018-06-07 ENCOUNTER — Telehealth: Payer: Self-pay | Admitting: Hematology

## 2018-06-07 ENCOUNTER — Inpatient Hospital Stay (HOSPITAL_BASED_OUTPATIENT_CLINIC_OR_DEPARTMENT_OTHER): Payer: BLUE CROSS/BLUE SHIELD | Admitting: Hematology

## 2018-06-07 ENCOUNTER — Encounter: Payer: Self-pay | Admitting: Hematology

## 2018-06-07 ENCOUNTER — Inpatient Hospital Stay: Payer: BLUE CROSS/BLUE SHIELD

## 2018-06-07 ENCOUNTER — Other Ambulatory Visit: Payer: Self-pay

## 2018-06-07 VITALS — BP 148/90 | HR 88 | Temp 98.4°F | Resp 18 | Ht 65.0 in | Wt 163.9 lb

## 2018-06-07 VITALS — BP 157/92 | HR 72

## 2018-06-07 DIAGNOSIS — E039 Hypothyroidism, unspecified: Secondary | ICD-10-CM

## 2018-06-07 DIAGNOSIS — C19 Malignant neoplasm of rectosigmoid junction: Secondary | ICD-10-CM | POA: Diagnosis not present

## 2018-06-07 DIAGNOSIS — C187 Malignant neoplasm of sigmoid colon: Secondary | ICD-10-CM

## 2018-06-07 DIAGNOSIS — Z5112 Encounter for antineoplastic immunotherapy: Secondary | ICD-10-CM | POA: Diagnosis not present

## 2018-06-07 DIAGNOSIS — C787 Secondary malignant neoplasm of liver and intrahepatic bile duct: Secondary | ICD-10-CM | POA: Diagnosis not present

## 2018-06-07 DIAGNOSIS — F419 Anxiety disorder, unspecified: Secondary | ICD-10-CM

## 2018-06-07 DIAGNOSIS — E119 Type 2 diabetes mellitus without complications: Secondary | ICD-10-CM | POA: Diagnosis not present

## 2018-06-07 DIAGNOSIS — D500 Iron deficiency anemia secondary to blood loss (chronic): Secondary | ICD-10-CM

## 2018-06-07 DIAGNOSIS — I10 Essential (primary) hypertension: Secondary | ICD-10-CM

## 2018-06-07 DIAGNOSIS — Z7189 Other specified counseling: Secondary | ICD-10-CM

## 2018-06-07 DIAGNOSIS — E876 Hypokalemia: Secondary | ICD-10-CM

## 2018-06-07 LAB — CBC WITH DIFFERENTIAL (CANCER CENTER ONLY)
Abs Immature Granulocytes: 0.02 10*3/uL (ref 0.00–0.07)
Basophils Absolute: 0.1 10*3/uL (ref 0.0–0.1)
Basophils Relative: 1 %
Eosinophils Absolute: 0.4 10*3/uL (ref 0.0–0.5)
Eosinophils Relative: 6 %
HCT: 39.1 % (ref 36.0–46.0)
Hemoglobin: 12.8 g/dL (ref 12.0–15.0)
Immature Granulocytes: 0 %
Lymphocytes Relative: 26 %
Lymphs Abs: 1.7 10*3/uL (ref 0.7–4.0)
MCH: 30.3 pg (ref 26.0–34.0)
MCHC: 32.7 g/dL (ref 30.0–36.0)
MCV: 92.7 fL (ref 80.0–100.0)
Monocytes Absolute: 0.5 10*3/uL (ref 0.1–1.0)
Monocytes Relative: 8 %
Neutro Abs: 3.8 10*3/uL (ref 1.7–7.7)
Neutrophils Relative %: 59 %
Platelet Count: 262 10*3/uL (ref 150–400)
RBC: 4.22 MIL/uL (ref 3.87–5.11)
RDW: 16.8 % — ABNORMAL HIGH (ref 11.5–15.5)
WBC Count: 6.6 10*3/uL (ref 4.0–10.5)
nRBC: 0 % (ref 0.0–0.2)

## 2018-06-07 LAB — CMP (CANCER CENTER ONLY)
ALT: 14 U/L (ref 0–44)
AST: 18 U/L (ref 15–41)
Albumin: 3.3 g/dL — ABNORMAL LOW (ref 3.5–5.0)
Alkaline Phosphatase: 169 U/L — ABNORMAL HIGH (ref 38–126)
Anion gap: 13 (ref 5–15)
BUN: 9 mg/dL (ref 6–20)
CO2: 24 mmol/L (ref 22–32)
Calcium: 7.5 mg/dL — ABNORMAL LOW (ref 8.9–10.3)
Chloride: 106 mmol/L (ref 98–111)
Creatinine: 0.77 mg/dL (ref 0.44–1.00)
GFR, Est AFR Am: >60 mL/min (ref >60)
GFR, Estimated: >60 mL/min (ref >60)
Glucose, Bld: 126 mg/dL — ABNORMAL HIGH (ref 70–99)
Potassium: 3.7 mmol/L (ref 3.5–5.1)
Sodium: 143 mmol/L (ref 135–145)
Total Bilirubin: 0.5 mg/dL (ref 0.3–1.2)
Total Protein: 7.2 g/dL (ref 6.5–8.1)

## 2018-06-07 LAB — FERRITIN: Ferritin: 14 ng/mL (ref 11–307)

## 2018-06-07 LAB — MAGNESIUM: Magnesium: 1.7 mg/dL (ref 1.7–2.4)

## 2018-06-07 LAB — IRON AND TIBC
Iron: 39 ug/dL — ABNORMAL LOW (ref 41–142)
Saturation Ratios: 10 % — ABNORMAL LOW (ref 21–57)
TIBC: 404 ug/dL (ref 236–444)
UIBC: 365 ug/dL (ref 120–384)

## 2018-06-07 MED ORDER — OXALIPLATIN CHEMO INJECTION 100 MG/20ML
130.0000 mg/m2 | Freq: Once | INTRAVENOUS | Status: AC
  Administered 2018-06-07: 240 mg via INTRAVENOUS
  Filled 2018-06-07: qty 40

## 2018-06-07 MED ORDER — DEXAMETHASONE SODIUM PHOSPHATE 10 MG/ML IJ SOLN
8.0000 mg | Freq: Once | INTRAMUSCULAR | Status: AC
  Administered 2018-06-07: 8 mg via INTRAVENOUS

## 2018-06-07 MED ORDER — PALONOSETRON HCL INJECTION 0.25 MG/5ML
INTRAVENOUS | Status: AC
  Filled 2018-06-07: qty 5

## 2018-06-07 MED ORDER — SODIUM CHLORIDE 0.9 % IV SOLN
Freq: Once | INTRAVENOUS | Status: AC
  Administered 2018-06-07: 12:00:00 via INTRAVENOUS
  Filled 2018-06-07: qty 250

## 2018-06-07 MED ORDER — DEXAMETHASONE SODIUM PHOSPHATE 10 MG/ML IJ SOLN
INTRAMUSCULAR | Status: AC
  Filled 2018-06-07: qty 1

## 2018-06-07 MED ORDER — DEXTROSE 5 % IV SOLN
Freq: Once | INTRAVENOUS | Status: AC
  Administered 2018-06-07: 14:00:00 via INTRAVENOUS
  Filled 2018-06-07: qty 250

## 2018-06-07 MED ORDER — SODIUM CHLORIDE 0.9 % IV SOLN
7.5000 mg/kg | Freq: Once | INTRAVENOUS | Status: AC
  Administered 2018-06-07: 550 mg via INTRAVENOUS
  Filled 2018-06-07: qty 6

## 2018-06-07 MED ORDER — PALONOSETRON HCL INJECTION 0.25 MG/5ML
0.2500 mg | Freq: Once | INTRAVENOUS | Status: AC
  Administered 2018-06-07: 0.25 mg via INTRAVENOUS

## 2018-06-07 NOTE — Telephone Encounter (Signed)
Called and scheduled appt per 3/16 los.  Patient aware of appt date and time.

## 2018-06-07 NOTE — Patient Instructions (Signed)
[place] Discharge Instructions for Patients Receiving Chemotherapy  Today you received the following chemotherapy agents :  Avastin,  Oxaliplatin.  To help prevent nausea and vomiting after your treatment, we encourage you to take your nausea medication as prescribed.   If you develop nausea and vomiting that is not controlled by your nausea medication, call the clinic.   BELOW ARE SYMPTOMS THAT SHOULD BE REPORTED IMMEDIATELY:  *FEVER GREATER THAN 100.5 F  *CHILLS WITH OR WITHOUT FEVER  NAUSEA AND VOMITING THAT IS NOT CONTROLLED WITH YOUR NAUSEA MEDICATION  *UNUSUAL SHORTNESS OF BREATH  *UNUSUAL BRUISING OR BLEEDING  TENDERNESS IN MOUTH AND THROAT WITH OR WITHOUT PRESENCE OF ULCERS  *URINARY PROBLEMS  *BOWEL PROBLEMS  UNUSUAL RASH Items with * indicate a potential emergency and should be followed up as soon as possible.  Feel free to call the clinic should you have any questions or concerns. The clinic phone number is [phone].  Please show the [place] at check-in to the Emergency Department and triage nurse.

## 2018-06-17 ENCOUNTER — Other Ambulatory Visit: Payer: Self-pay | Admitting: Adult Health

## 2018-06-17 ENCOUNTER — Ambulatory Visit: Payer: Self-pay

## 2018-06-17 ENCOUNTER — Other Ambulatory Visit: Payer: Self-pay

## 2018-06-17 ENCOUNTER — Ambulatory Visit: Payer: Self-pay | Admitting: Hematology

## 2018-06-17 DIAGNOSIS — F419 Anxiety disorder, unspecified: Secondary | ICD-10-CM

## 2018-06-24 ENCOUNTER — Encounter: Payer: Self-pay | Admitting: Hematology

## 2018-06-25 ENCOUNTER — Telehealth: Payer: Self-pay | Admitting: Hematology

## 2018-06-25 ENCOUNTER — Telehealth: Payer: Self-pay

## 2018-06-25 NOTE — Telephone Encounter (Signed)
R/s appt per 4/3 sch message - pushed appt two weeks . Unable to leave message sent message to MD

## 2018-06-25 NOTE — Telephone Encounter (Signed)
Spoke with patient is desires to reschedule her appointments for Monday to two weeks out, advised her that she really needs treatment, but she is tool scared to come in because of COVID-19, Dr. Burr Medico is aware and call the patient later today.

## 2018-06-25 NOTE — Telephone Encounter (Signed)
Received notification from our NT that patient wishes to reschedule her appointments for Monday 4/6 to a later date, attempted to contact the patient, there was no answer and the mailbox is full.

## 2018-06-28 ENCOUNTER — Inpatient Hospital Stay: Payer: BLUE CROSS/BLUE SHIELD | Admitting: Hematology

## 2018-06-28 ENCOUNTER — Inpatient Hospital Stay: Payer: BLUE CROSS/BLUE SHIELD

## 2018-07-12 ENCOUNTER — Telehealth: Payer: Self-pay | Admitting: Hematology

## 2018-07-12 NOTE — Telephone Encounter (Signed)
Spoke with patient and got her set up with transportation for her appointment on 4/22. Gave her my information if she needed help with any future appointments.

## 2018-07-12 NOTE — Progress Notes (Signed)
[place]   [phone] [phone]   Clinic Follow up Note   Patient Care Team: Esaw Grandchild, NP as PCP - General (Family Medicine) Delrae Rend, MD as Consulting Physician (Endocrinology) Truitt Merle, MD as Consulting Physician (Hematology) Alla Feeling, NP as Nurse Practitioner (Nurse Practitioner) Clarene Essex, MD as Consulting Physician (Gastroenterology)   I connected with Cherly Beach on 07/14/2018 at 11:00 AM EDT by telephone visit and verified that I am speaking with the correct person using two identifiers.  I discussed the limitations, risks, security and privacy concerns of performing an evaluation and management service by telephone and the availability of in person appointments. I also discussed with the patient that there may be a patient responsible charge related to this service. The patient expressed understanding and agreed to proceed.   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 Abdominopelvic adenopathy. Rectosigmoid mass with suggestion of partial obstruction as evidenced by large colonic stool burden more proximally. 2.  Aortic Atherosclerosis (ICD10-I70.0).  This is age advanced.    05/31/2017 Tumor Marker    CEA 353.3 (elevated) AFP 4.5 (normal)    05/31/2017 Initial Biopsy    Diagnosis Colon, biopsy, sigmoid - INVASIVE ADENOCARCINOMA - SEE COMMENT    05/31/2017  Imaging    CT CHEST IMPRESSION: 1. No evidence of metastatic disease in the chest. 2. No acute findings.  Aortic Atherosclerosis (ICD10-I70.0).     05/31/2017 Procedure    Colonoscopy per Dr. Watt Climes Findings: An infiltrative and ulcerated partially obstructing medium-sized mass was found in the recto-sigmoid colon. The mass was circumferential. The mass measured four cm in length. No bleeding was present.   Impression - One small polyp in the rectum. - The examination was otherwise normal. - Malignant partially obstructing tumor in the recto-sigmoid colon. Biopsied. - One medium polyp in the proximal sigmoid colon. - The examination was otherwise normal. - Internal hemorrhoids.    06/03/2017 Initial Diagnosis    Malignant neoplasm of transverse colon ([place])    06/11/2017 Cancer Staging    Staging form: Colon and Rectum, AJCC 8th Edition - Clinical stage from 06/11/2017: Stage IVA (cTX, cN1b, pM1a) - Signed by Truitt Merle, MD on 06/15/2017    06/11/2017 Pathology Results    Liver biopsy confirmed metastatic colon cancer    07/23/2017 Imaging    CT CAP IMPRESSION: 1. Mild progression of hepatic metastasis. 2. Similar rectosigmoid primary with abdominopelvic nodal metastasis. 3.  No acute process or evidence of metastatic disease in the chest. 4. Aortic Atherosclerosis (ICD10-I70.0). Coronary artery atherosclerosis. This is age advanced. 5. Proximal colonic constipation, again suggesting a component of partial obstruction at the primary site. 6. Mild ascending aortic dilatation at 4.1 cm.    08/25/2017 - 05/10/2018 Chemotherapy    -Xeloda 1539m BID 1 week on and 1 week off starting 08/25/17. Due to her worsening hand-foot syndrome her dose was reduced to 15044min the AM and 100082mn the PM. Due  to disease progression we swithced her to CAPOX  -Vectibix every 2 weeks starting 08/25/17. Due to skin rash will stop starting 05/10/18.      12/14/2017 Imaging    IMPRESSION: 1. Response to  therapy. Marked decrease in hepatic metastatic burden. More mild decrease in abdominopelvic adenopathy and definition of rectosigmoid primary. 2.  No acute process or evidence of metastatic disease in the chest. 3.  Aortic Atherosclerosis (ICD10-I70.0).     04/28/2018 Imaging    CT CAP 04/28/18  IMPRESSION: 1. Index liver metastases measured on the previous study show no substantial interval change although new liver lesions on today's exam are concerning for progressive disease. 2. Mild lymphadenopathy in the upper abdomen is stable. 3. Persistent mild ill-defined wall thickening in the distal sigmoid colon near the rectosigmoid junction. 4.  Aortic Atherosclerois (ICD10-170.0)     05/17/2018 -  Chemotherapy    CAPOX every 3 weeks with Xeloda 1532m BID 2 weeks on/1week off starting 05/17/18 -Add Avastin to first cycle.       CURRENT THERAPY:  Second line CAPOX and Avantin every 3 weeks with Xeloda 15063mBID 2 weeks on/1 week off starting 05/17/18  INTERVAL HISTORY:  KiNEVEAH BANGs here for a follow up of treatment. She was able to identify herself by birth date.  She notes she is doing well but has been having headache everyday lately. She has been having a headache for the last week. She wanted to reschedule her treatment to next week due to HA and COVID-19. She has been taking Advil-pm or Xanax given her trouble sleeping. She attributes sleeping to anxiety. She has not checked her BP lately. She has not tried mirtazapine.  She notes she has not been on Xeloda since last cycle in 05/2018.     REVIEW OF SYSTEMS:   Constitutional: Denies fevers, chills or abnormal weight loss (+) HA (+) Trouble sleeping.  Eyes: Denies blurriness of vision Ears, nose, mouth, throat, and face: Denies mucositis or sore throat Respiratory: Denies cough, dyspnea or wheezes Cardiovascular: Denies palpitation, chest discomfort or lower extremity swelling Gastrointestinal:  Denies nausea, heartburn  or change in bowel habits Skin: Denies abnormal skin rashes Lymphatics: Denies new lymphadenopathy or easy bruising Neurological:Denies numbness, tingling or new weaknesses Behavioral/Psych: Mood is stable, no new changes (+) anxiety  All other systems were reviewed with the patient and are negative.  MEDICAL HISTORY:  Past Medical History:  Diagnosis Date  . Anxiety   . [place] ([place]   colon, liver  . Chest pain   . Colon cancer ([facility]  . Complication of anesthesia   . Depression   . Diabetes mellitus   . Dizziness   . Family history of breast cancer   . Family history of stomach cancer   . Hyperlipidemia   . Hypertension   . Hypothyroidism   . Obesity   . PONV (postoperative nausea and vomiting)   . Tachycardia     SURGICAL HISTORY: Past Surgical History:  Procedure Laterality Date  . FLEXIBLE SIGMOIDOSCOPY N/A 05/31/2017   Procedure: FLEXIBLE SIGMOIDOSCOPY;  Surgeon: MaClarene EssexMD;  Location: [facility];  Service: Endoscopy;  Laterality: N/A;  . WISDOM TOOTH EXTRACTION     age 48's  I have reviewed the social history and family history with the patient and they are unchanged from previous note.  ALLERGIES:  is allergic to bupropion and amoxicillin.  MEDICATIONS:  Current Outpatient Medications  Medication Sig Dispense Refill  . acetaminophen-codeine (  TYLENOL #3) 300-30 MG tablet Take 1 tablet by mouth every 6 (six) hours as needed for moderate pain. 30 tablet 0  . albuterol (PROVENTIL HFA;VENTOLIN HFA) 108 (90 Base) MCG/ACT inhaler Inhale 2 puffs into the lungs every 6 (six) hours as needed for wheezing or shortness of breath. 1 Inhaler 0  . ALPRAZolam (XANAX) 1 MG tablet TAKE 1 TABLET BY MOUTH AT BEDTIME AS NEEDED FOR ANXIETY 30 tablet 0  . atorvastatin (LIPITOR) 10 MG tablet Take 1 tablet (10 mg total) by mouth daily. 90 tablet 3  . benzonatate (TESSALON) 100 MG capsule Take 1 capsule (100 mg total) by mouth 3 (three) times daily as needed for cough. 20  capsule 0  . capecitabine (XELODA) 500 MG tablet Take 3 tablets (1,500 mg total) by mouth 2 (two) times daily after a meal. Take for 14 days on, 7 days off, repeat every 21 days. 84 tablet 1  . cetirizine (ZYRTEC ALLERGY) 10 MG tablet Take 1 tablet (10 mg total) by mouth daily. 30 tablet 1  . CINNAMON PO Take 1 tablet by mouth daily.     . clindamycin (CLINDAGEL) 1 % gel Apply topically 2 (two) times daily. 60 g 1  . fluticasone (FLONASE) 50 MCG/ACT nasal spray Place 1 spray into both nostrils daily. (Patient taking differently: Place 1 spray into both nostrils daily as needed for allergies. ) 16 g 2  . insulin NPH-regular Human (NOVOLIN 70/30) (70-30) 100 UNIT/ML injection Inject 25 Units into the skin 2 (two) times daily with a meal.     . levothyroxine (SYNTHROID, LEVOTHROID) 112 MCG tablet Take 1 tablet (112 mcg total) by mouth daily before breakfast. 90 tablet 3  . magnesium oxide (MAG-OX) 400 (241.3 Mg) MG tablet Take 1 tablet (400 mg total) by mouth 2 (two) times daily. 60 tablet 1  . mirtazapine (REMERON) 7.5 MG tablet Take 1 tablet (7.5 mg total) by mouth at bedtime. 30 tablet 0  . Omega-3 Fatty Acids (FISH OIL) 1000 MG CAPS Take 1,000 mg by mouth daily.     . polyethylene glycol (MIRALAX / GLYCOLAX) packet Take 17 g by mouth daily. 14 each 0  . prochlorperazine (COMPAZINE) 10 MG tablet Take 1 tablet (10 mg total) by mouth every 6 (six) hours as needed (NAUSEA). 30 tablet 1  . propranolol (INDERAL) 10 MG tablet TAKE 1 TABLET BY MOUTH EVERY DAY 90 tablet 1  . TURMERIC PO Take 1 capsule by mouth daily.     . urea (CARMOL) 10 % cream Apply topically as needed. 71 g 0   No current facility-administered medications for this visit.     PHYSICAL EXAMINATION: ECOG PERFORMANCE STATUS: 1 - Symptomatic but completely ambulatory  ** No vitals taken today, Exam not performed today **  LABORATORY DATA:  I have reviewed the data as listed CBC Latest Ref Rng & Units 06/07/2018 05/24/2018 05/17/2018   WBC 4.0 - 10.5 K/uL 6.6 8.3 9.4  Hemoglobin 12.0 - 15.0 g/dL 12.8 12.3 13.5  Hematocrit 36.0 - 46.0 % 39.1 37.1 41.6  Platelets 150 - 400 K/uL 262 176 217     CMP Latest Ref Rng & Units 06/07/2018 05/24/2018 05/17/2018  Glucose 70 - 99 mg/dL 126(H) 248(H) 131(H)  BUN 6 - 20 mg/dL _0 Creatinine 0.44 - 1.00 mg/dL 0.77 0.77 0.80  Sodium 135 - 145 mmol/L 143 140 143  Potassium 3.5 - 5.1 mmol/L 3.7 3.8 3.5  Chloride 98 - 111 mmol/L 106 103 105  CO2  22 - 32 mmol/L _0 Calcium 8.9 - 10.3 mg/dL 7.5(L) 7.2(L) 7.9(L)  Total Protein 6.5 - 8.1 g/dL 7.2 7.0 7.6  Total Bilirubin 0.3 - 1.2 mg/dL 0.5 0.5 0.5  Alkaline Phos 38 - 126 U/L 169(H) 167(H) 164(H)  AST 15 - 41 U/L _1 ALT 0 - 44 U/L _2 RADIOGRAPHIC STUDIES: I have personally reviewed the radiological images as listed and agreed with the findings in the report. No results found.   ASSESSMENT & PLAN:  Beverly Schwartz is a 48 y.o. female with   1. Sigmoid adenocarcinoma, with abdominopelvic adenopathy andhepatic metastasis, stage IV, MSI-stable , KRAS/NRAS/BRAF wild type -Diagnosed in 05/2017. Treated withfirst-line chemotherapyXelodaandVectibix and tolerated well. Patient declined intensive chemo regiments previously due to fear of side effects.Unfortunatelyshe had mild disease progression after 8 months of therapy. -she is currently on second lineCAPOX and Avastin, she does not want a port orthe infusion pump at this point.  -She started CAPOXand Avastinon 05/17/18. She tolerated the first cycle moderately well with fatigue, nausea, and muscle cramps in her handsfor 3 days, much improved now. I encouraged her to increase her fluid intake, including Gatorade to help with cramping.  -Plan to scan her again in 07/2018.  -She has been very reluctant to come to clinic given COVID-19 concerns and has not been compliant with treatment or follow ups lately.  -Her last infusion was 06/07/18 and has not been  taking her oral Xeloda since 06/18/18.  -she again requested to post-pone infusion to next week (scheudled for today but she did not come in) due to her COVID-19 concerns. I recommend her to restart oral Xeloda to gain some benefit of treatment. She is agreeable.  -She will start next cycle Xeloda today 1584m BID. Will schedule next Oxaliplatin and vectibix infusion next week.  -F/u in 1 week.   2.Hypokalemia, hypo-magnesium anemia, hypocalcemia -Probably secondary chemotherapy, especiallyVectibix -Continue oral K, Mag and Calcium.   3. DM, HTN, HL -f/u with PCP -On propranolol for HTN currently.  We discussed that Avastin can cause hypotension. -I previously encouraged her to monitor her BP at home. If elevated at home as well, I will prescribe Amlodipine to help better control her HTN.   4. Financial and Social issues, Anxiety, depression -Shefollows up as needed withSocial Worker LHollice Espy-She is currently on Xanax  -Shehas regained insurancewithBlue cross insurancethis year  5.Goal of care discussion  -She understands the goal of care is palliative, her cancer is incurable. The goal of therapy is to prolong her life and improve her quality of life. -She is full code for now   6. Hand-foot syndrome, secondary to Xeloda  -She developed mild hand-foot syndrome, secondary to Xeloda.This has much improved with a dose reduction of Xeloda. -She will continue to use Urea cream -Stable  7. HA, trouble sleeping, anxiety  -She has had trouble sleeping from anxiety and allergies which has lead to daily headaches.  -She has Xanax which she uses as needed.  -I recommend her to try Mirtazapine for sleep and anxiety/depression, potential side effects and benefit discussed, she agrees. I called in Mirtazapine 7.56mHS today (07/14/18).  -Will monitor.   Plan -Last cycle ended 06/18/18. She will start next cycle Xeloda [id number]  -Will schedule lab, f/u and oxaliplatin  and vectibix next week  -I called in mirtazapine for her today     No problem-specific Assessment & Plan notes found  for this encounter.   No orders of the defined types were placed in this encounter.  I discussed the assessment and treatment plan with the patient. The patient was provided an opportunity to ask questions and all were answered. The patient agreed with the plan and demonstrated an understanding of the instructions.  The patient was advised to call back or seek an in-person evaluation if the symptoms worsen or if the condition fails to improve as anticipated.   I provided 10 minutes of non face-to-face telephone visit time during this encounter, and > 50% was spent counseling as documented under my assessment & plan.    Truitt Merle, MD 07/14/2018   I, Joslyn Devon, am acting as scribe for Truitt Merle, MD.   I have reviewed the above documentation for accuracy and completeness, and I agree with the above.

## 2018-07-13 ENCOUNTER — Telehealth: Payer: Self-pay

## 2018-07-13 NOTE — Telephone Encounter (Signed)
Oral Oncology Patient Advocate Encounter  [place] has attempted to reach the patient 3 times to get her Xeloda refilled, with no success. I called all 3 numbers listed in her chart today and was able to leave a voicemail on 2 numbers.  [place] Patient [place] Phone [phone] Fax [phone] 07/13/2018   3:13 PM

## 2018-07-13 NOTE — Telephone Encounter (Signed)
Oral Oncology Patient Advocate Encounter  Patient called back and said that she has a full month on hand because she has not had her infusion yet and they cancelled her infusion for tomorrow as well. She thinks her next infusion will be rescheduled for next week so I will retimed her refill call for 07/28/18.  [place] Patient [place] Phone [phone] Fax [phone] 07/13/2018   3:42 PM

## 2018-07-13 NOTE — Telephone Encounter (Signed)
Thu, please let pt know that she has chemo infusion scheduled for tomorrow. Encourage her to come in. Thanks   Truitt Merle MD

## 2018-07-14 ENCOUNTER — Inpatient Hospital Stay: Payer: BLUE CROSS/BLUE SHIELD

## 2018-07-14 ENCOUNTER — Inpatient Hospital Stay (HOSPITAL_BASED_OUTPATIENT_CLINIC_OR_DEPARTMENT_OTHER): Payer: BLUE CROSS/BLUE SHIELD | Admitting: Hematology

## 2018-07-14 ENCOUNTER — Telehealth: Payer: Self-pay

## 2018-07-14 ENCOUNTER — Encounter: Payer: Self-pay | Admitting: Hematology

## 2018-07-14 ENCOUNTER — Inpatient Hospital Stay: Payer: BLUE CROSS/BLUE SHIELD | Attending: Hematology

## 2018-07-14 DIAGNOSIS — G4700 Insomnia, unspecified: Secondary | ICD-10-CM

## 2018-07-14 DIAGNOSIS — C787 Secondary malignant neoplasm of liver and intrahepatic bile duct: Secondary | ICD-10-CM

## 2018-07-14 DIAGNOSIS — R51 Headache: Secondary | ICD-10-CM

## 2018-07-14 DIAGNOSIS — I10 Essential (primary) hypertension: Secondary | ICD-10-CM | POA: Diagnosis not present

## 2018-07-14 DIAGNOSIS — F418 Other specified anxiety disorders: Secondary | ICD-10-CM

## 2018-07-14 DIAGNOSIS — C187 Malignant neoplasm of sigmoid colon: Secondary | ICD-10-CM

## 2018-07-14 DIAGNOSIS — E119 Type 2 diabetes mellitus without complications: Secondary | ICD-10-CM

## 2018-07-14 DIAGNOSIS — D649 Anemia, unspecified: Secondary | ICD-10-CM

## 2018-07-14 MED ORDER — MIRTAZAPINE 7.5 MG PO TABS: 7.5000 mg | ORAL_TABLET | Freq: Every day | ORAL | 0 refills | Status: DC

## 2018-07-14 NOTE — Telephone Encounter (Signed)
Patient called with concerns about coming on Wednesday for treatment.  I explained to the patient it had been some time since she has been here.  The concern is that she could have disease progression the longer it is put off.  She verbalized an understanding and asked about transportation.  I forwarded a message to Ginette Otto to help with this.

## 2018-07-15 ENCOUNTER — Other Ambulatory Visit: Payer: Self-pay | Admitting: Adult Health

## 2018-07-15 NOTE — Telephone Encounter (Signed)
Pt has not had a f/u for chronic conditions in over a year.  Given her current medical condition (cancer) and the risk of contracting COVID-19, is it acceptable to refill medication at this time without OV or does she need a virtual visit?  Charyl Bigger, CMA

## 2018-07-21 ENCOUNTER — Telehealth: Payer: Self-pay | Admitting: Hematology

## 2018-07-21 NOTE — Telephone Encounter (Signed)
Scheduled appt per 4/28 sch message - unable to reach patient . Left message with apt date and time

## 2018-07-22 ENCOUNTER — Ambulatory Visit: Payer: BLUE CROSS/BLUE SHIELD

## 2018-07-22 NOTE — Progress Notes (Signed)
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 Abdominopelvic adenopathy. Rectosigmoid mass with suggestion of partial obstruction as evidenced by large colonic stool burden more proximally. 2.  Aortic Atherosclerosis (ICD10-I70.0).  This is age advanced.    05/31/2017 Tumor Marker    CEA 353.3 (elevated) AFP 4.5 (normal)    05/31/2017 Initial Biopsy    Diagnosis Colon, biopsy, sigmoid - INVASIVE ADENOCARCINOMA - SEE COMMENT    05/31/2017 Imaging    CT CHEST IMPRESSION: 1. No evidence of metastatic disease in the chest. 2. No acute findings.  Aortic Atherosclerosis (ICD10-I70.0).     05/31/2017 Procedure    Colonoscopy per Dr. Watt Climes Findings: An infiltrative and ulcerated partially obstructing medium-sized mass was found in the recto-sigmoid colon. The mass was circumferential. The mass measured four cm in length. No bleeding was present.   Impression - One small polyp in the rectum. - The examination was otherwise normal. - Malignant partially obstructing tumor in the  recto-sigmoid colon. Biopsied. - One medium polyp in the proximal sigmoid colon. - The examination was otherwise normal. - Internal hemorrhoids.    06/03/2017 Initial Diagnosis    Malignant neoplasm of transverse colon ([place])    06/11/2017 Cancer Staging    Staging form: Colon and Rectum, AJCC 8th Edition - Clinical stage from 06/11/2017: Stage IVA (cTX, cN1b, pM1a) - Signed by Truitt Merle, MD on 06/15/2017    06/11/2017 Pathology Results    Liver biopsy confirmed metastatic colon cancer    07/23/2017 Imaging    CT CAP IMPRESSION: 1. Mild progression of hepatic metastasis. 2. Similar rectosigmoid primary with abdominopelvic nodal metastasis. 3.  No acute process or evidence of metastatic disease in the chest. 4. Aortic Atherosclerosis (ICD10-I70.0). Coronary artery atherosclerosis. This is age advanced. 5. Proximal colonic constipation, again suggesting a component of partial obstruction at the primary site. 6. Mild ascending aortic dilatation at 4.1 cm.    08/25/2017 - 05/10/2018 Chemotherapy    -Xeloda '1500mg'$  BID 1 week on and 1 week off starting 08/25/17. Due to her worsening hand-foot syndrome her dose was reduced to '1500mg'$  in the AM and '1000mg'$  in the PM. Due to disease progression we swithced her to CAPOX  -Vectibix every 2 weeks starting 08/25/17. Due to skin rash will stop starting 05/10/18.      12/14/2017 Imaging    IMPRESSION: 1. Response to therapy. Marked decrease in hepatic metastatic burden. More mild decrease in abdominopelvic adenopathy and definition of rectosigmoid primary. 2.  No acute process or evidence of metastatic disease in the chest. 3.  Aortic Atherosclerosis (ICD10-I70.0).     04/28/2018 Imaging    CT CAP 04/28/18  IMPRESSION: 1. Index  liver metastases measured on the previous study show no substantial interval change although new liver lesions on today's exam are concerning for progressive disease. 2. Mild lymphadenopathy in the upper abdomen is stable. 3.  Persistent mild ill-defined wall thickening in the distal sigmoid colon near the rectosigmoid junction. 4.  Aortic Atherosclerois (ICD10-170.0)     05/17/2018 -  Chemotherapy    CAPOX every 3 weeks with Xeloda '1500mg'$  BID 2 weeks on/1week off starting 05/17/18 -Add Avastin to first cycle.       CURRENT THERAPY:  Second line CAPOX and Avantin every 3 weeks with Xeloda '1500mg'$  BID 2 weeks on/1 week off starting 05/17/18  INTERVAL HISTORY:  Beverly Schwartz is here for a follow up and treatment. She presents to the clinic today by herself.  She notes she woke up not feeling well. She has a migraine, nausea and her BP is elevated today. She is taking Propranolol. She notes she has been under extra stress last weekend. She notes she gets migraines daily and with take motrin, she denies vision issues. She notes tingling of her fingers. She notes she has been drinking plenty of water and eating adequately. She would like to skip IV infusion today and get IV Fluids. She will complete current Xeloda on 07/29/18. She denies fever at home.     REVIEW OF SYSTEMS:   Constitutional: Denies fevers, chills or abnormal weight loss Eyes: Denies blurriness of vision Ears, nose, mouth, throat, and face: Denies mucositis or sore throat Respiratory: Denies cough, dyspnea or wheezes Cardiovascular: Denies palpitation, chest discomfort or lower extremity swelling Gastrointestinal:  Denies heartburn or change in bowel habits (+) Intermittent stomach pain (+) nausea  Skin: Denies abnormal skin rashes (+) hand-foot syndrome much improved  Lymphatics: Denies new lymphadenopathy or easy bruising Neurological:Denies numbness, tingling or new weaknesses (+) Migraine (+) tingling of fingers  Behavioral/Psych: Mood is stable, no new changes (+) stressed  All other systems were reviewed with the patient and are negative.  MEDICAL HISTORY:  Past Medical History:  Diagnosis Date  . Anxiety   . Cancer ([place])    colon,  liver  . Chest pain   . Colon cancer ([place])   . Complication of anesthesia   . Depression   . Diabetes mellitus   . Dizziness   . Family history of breast cancer   . Family history of stomach cancer   . Hyperlipidemia   . Hypertension   . Hypothyroidism   . Obesity   . PONV (postoperative nausea and vomiting)   . Tachycardia     SURGICAL HISTORY: Past Surgical History:  Procedure Laterality Date  . FLEXIBLE SIGMOIDOSCOPY N/A 05/31/2017   Procedure: FLEXIBLE SIGMOIDOSCOPY;  Surgeon: Clarene Essex, MD;  Location: [facility];  Service: Endoscopy;  Laterality: N/A;  . WISDOM TOOTH EXTRACTION     age 79's    I have reviewed the social history and family history with the patient and they are unchanged from previous note.  ALLERGIES:  is allergic to bupropion and amoxicillin.  MEDICATIONS:  Current Outpatient Medications  Medication Sig Dispense Refill  . acetaminophen-codeine (TYLENOL #3) 300-30 MG tablet Take 1 tablet by mouth every 6 (six) hours as needed for moderate pain. 30 tablet 0  . albuterol (PROVENTIL HFA;VENTOLIN HFA) 108 (90 Base) MCG/ACT inhaler Inhale 2 puffs into the lungs every 6 (six) hours as needed for wheezing or shortness of breath. 1 Inhaler 0  . ALPRAZolam (XANAX) 1 MG tablet TAKE 1 TABLET BY MOUTH AT  BEDTIME AS NEEDED FOR ANXIETY 30 tablet 0  . atorvastatin (LIPITOR) 10 MG tablet Take 1 tablet (10 mg total) by mouth daily. 90 tablet 3  . benzonatate (TESSALON) 100 MG capsule Take 1 capsule (100 mg total) by mouth 3 (three) times daily as needed for cough. 20 capsule 0  . capecitabine (XELODA) 500 MG tablet Take 3 tablets (1,500 mg total) by mouth 2 (two) times daily after a meal. Take for 14 days on, 7 days off, repeat every 21 days. 84 tablet 1  . cetirizine (ZYRTEC ALLERGY) 10 MG tablet Take 1 tablet (10 mg total) by mouth daily. 30 tablet 1  . CINNAMON PO Take 1 tablet by mouth daily.     . clindamycin (CLINDAGEL) 1 % gel Apply topically 2 (two) times  daily. 60 g 1  . fluticasone (FLONASE) 50 MCG/ACT nasal spray Place 1 spray into both nostrils daily. (Patient taking differently: Place 1 spray into both nostrils daily as needed for allergies. ) 16 g 2  . insulin NPH-regular Human (NOVOLIN 70/30) (70-30) 100 UNIT/ML injection Inject 25 Units into the skin 2 (two) times daily with a meal.     . levothyroxine (SYNTHROID, LEVOTHROID) 112 MCG tablet Take 1 tablet (112 mcg total) by mouth daily before breakfast. 90 tablet 3  . magnesium oxide (MAG-OX) 400 (241.3 Mg) MG tablet Take 1 tablet (400 mg total) by mouth 2 (two) times daily. 60 tablet 1  . mirtazapine (REMERON) 7.5 MG tablet Take 1 tablet (7.5 mg total) by mouth at bedtime. 30 tablet 0  . Omega-3 Fatty Acids (FISH OIL) 1000 MG CAPS Take 1,000 mg by mouth daily.     . polyethylene glycol (MIRALAX / GLYCOLAX) packet Take 17 g by mouth daily. 14 each 0  . prochlorperazine (COMPAZINE) 10 MG tablet Take 1 tablet (10 mg total) by mouth every 6 (six) hours as needed (NAUSEA). 30 tablet 1  . propranolol (INDERAL) 10 MG tablet TAKE 1 TABLET BY MOUTH EVERY DAY 90 tablet 1  . TURMERIC PO Take 1 capsule by mouth daily.     . urea (CARMOL) 10 % cream Apply topically as needed. 71 g 0  . amLODipine (NORVASC) 5 MG tablet Take 1 tablet (5 mg total) by mouth daily. 30 tablet 0   No current facility-administered medications for this visit.    Facility-Administered Medications Ordered in Other Visits  Medication Dose Route Frequency Provider Last Rate Last Dose  . bevacizumab (AVASTIN) 375 mg in sodium chloride 0.9 % 100 mL chemo infusion  5 mg/kg (Treatment Plan Recorded) Intravenous Once Truitt Merle, MD      . dexamethasone (DECADRON) injection 8 mg  8 mg Intravenous Once Truitt Merle, MD      . ondansetron [facility]) injection 8 mg  8 mg Intravenous Once Truitt Merle, MD        PHYSICAL EXAMINATION: ECOG PERFORMANCE STATUS: 2 - Symptomatic, <50% confined to bed  Vitals:   07/23/18 1140  BP: (!) 186/103   Pulse: 79  Resp: 17  Temp: 98 F (36.7 C)  SpO2: 100%   Filed Weights   07/23/18 1140  Weight: 164 lb 3.2 oz (74.5 kg)    GENERAL:alert, no distress and comfortable SKIN: skin color, texture, turgor are normal, no rashes or significant lesions EYES: normal, Conjunctiva are pink and non-injected, sclera clear OROPHARYNX:no exudate, no erythema and lips, buccal mucosa, and tongue normal  NECK: supple, thyroid normal size, non-tender, without nodularity LYMPH:  no palpable lymphadenopathy in the  cervical, axillary or inguinal LUNGS: clear to auscultation and percussion with normal breathing effort HEART: regular rate & rhythm and no murmurs and no lower extremity edema ABDOMEN:abdomen soft, non-tender and normal bowel sounds Musculoskeletal:no cyanosis of digits and no clubbing  NEURO: alert & oriented x 3 with fluent speech, no focal motor/sensory deficits  LABORATORY DATA:  I have reviewed the data as listed CBC Latest Ref Rng & Units 07/23/2018 06/07/2018 05/24/2018  WBC 4.0 - 10.5 K/uL 8.9 6.6 8.3  Hemoglobin 12.0 - 15.0 g/dL 13.7 12.8 12.3  Hematocrit 36.0 - 46.0 % 41.1 39.1 37.1  Platelets 150 - 400 K/uL 227 262 176     CMP Latest Ref Rng & Units 07/23/2018 06/07/2018 05/24/2018  Glucose 70 - 99 mg/dL 150(H) 126(H) 248(H)  BUN 6 - 20 mg/dL '10 9 7  '$ Creatinine 0.44 - 1.00 mg/dL 0.77 0.77 0.77  Sodium 135 - 145 mmol/L 143 143 140  Potassium 3.5 - 5.1 mmol/L 3.6 3.7 3.8  Chloride 98 - 111 mmol/L 106 106 103  CO2 22 - 32 mmol/L '26 24 26  '$ Calcium 8.9 - 10.3 mg/dL 8.8(L) 7.5(L) 7.2(L)  Total Protein 6.5 - 8.1 g/dL 7.5 7.2 7.0  Total Bilirubin 0.3 - 1.2 mg/dL 0.4 0.5 0.5  Alkaline Phos 38 - 126 U/L 133(H) 169(H) 167(H)  AST 15 - 41 U/L '17 18 15  '$ ALT 0 - 44 U/L '13 14 9      '$ RADIOGRAPHIC STUDIES: I have personally reviewed the radiological images as listed and agreed with the findings in the report. No results found.   ASSESSMENT & PLAN:  Beverly Schwartz is a 49 y.o. female  with   1. Sigmoid adenocarcinoma, with abdominopelvic adenopathy andhepatic metastasis, stage IV, MSI-stable , KRAS/NRAS/BRAF wild type -Diagnosed in 05/2017. Treated withfirst-line chemotherapyXelodaandVectibixand tolerated well. Patient declined intensive chemo regiments previously due to fear of side effects.Unfortunatelyshe had mild disease progressionafter 8 months of therapy. -she is currently on second lineCAPOX and Avastin, she does not want a port orthe infusion pump at this point.  -She started CAPOXand Avastinon 05/17/18. Shetolerated thefirst cycle moderately well with fatigue, nausea, and muscle cramps in her handsfor 3 days, much improved now. I encouraged herto increase her fluid intake, including Gatorade to help with cramping.  -Plan to scan her again in 07/2018.  -She has been very reluctant to come to clinic given COVID-19 concerns and has not been compliant with treatment or follow ups lately.  -Her last infusion was 06/07/18 and has not been taking her oral Xeloda since 06/18/18 due to worry of COVID-19.  -I starter her current cycle Xeloda on 07/14/18 at '1500mg'$  BID.  -Labs reviewed, CBC WNL, trace urine protein, CMP unremarkable. Iron panel and CEA still pending. Given her nausea and severe headache, we will hold Oxaliplatin today and Proceed with Avastin. Will give antiemetics as well.  -She will continue Xeloda and complete current cycle on 5/7.  -f/u in 2 weeks -repeat scan in June   2.Hypokalemia, hypo-magnesium anemia, hypocalcemia -Probably secondary chemotherapy, especiallyVectibix -Continueoral K, Mag and Calcium. -resolved now since she stopped vectibix   3. DM, HTN, HL -f/u with PCP -Onpropranololfor HTN currently.We discussed that Avastin can cause hypotension. -I previously encouraged her to monitor her BP at home.  -Her HTN remains elevated in clinic. She also has been having daily migraines. BP at 186/103 today (07/29/18). Will repeat  BP in clinic today, if elevates still will give clonidine  -I will call in amlodipine today for  her to start and help control her BP.   4. Financial and Social issues, Anxiety, depression -Shefollows up as needed withSocial Worker Hollice Espy -She is currently on Xanax  -Shehas regained insurancewithBlue cross insurancethis year  5.Goal of care discussion  -She understands the goal of care is palliative, her cancer is incurable. The goal of therapy is to prolong her life and improve her quality of life. -She is full code for now   6. Hand-foot syndrome, secondary to Xeloda  -She developed mild hand-foot syndrome, secondary to Xeloda.This has much improved with a dose reduction of Xeloda. -She will continue to use Urea cream -Has much improved.   7. HA, trouble sleeping, anxiety  -She has had trouble sleeping from anxiety and allergies which has lead to daily headaches.  -She has Xanax which she uses as needed.  -I recommend her to try Mirtazapine for sleep and anxiety/depression, potential side effects and benefit discussed, she agrees. I previously called in Mirtazapine 7.'5mg'$  HS (07/14/18).  -Will monitor.   8. Headaches -pt has been having daily headaches, she thinks it's migraine -it could be related to her uncontrolled hypertension. I called in amlodipine '5mg'$  daily for her today   Plan -I called in Amlodipine '5mg'$  daily today  -Labs reviewed and adequate to proceed with Avastin ('5mg'$ /kg today), antiemetics and steroids, no Oxaliplatin.  -Lab, flush, f/u and Oxaliplatin in 2 weeks    No problem-specific Assessment & Plan notes found for this encounter.   No orders of the defined types were placed in this encounter.  All questions were answered. The patient knows to call the clinic with any problems, questions or concerns. No barriers to learning was detected. I spent 20 minutes counseling the patient face to face. The total time spent in the appointment was  25 minutes and more than 50% was on counseling and review of test results     Truitt Merle, MD 07/23/2018   I, Joslyn Devon, am acting as scribe for Truitt Merle, MD.   I have reviewed the above documentation for accuracy and completeness, and I agree with the above.

## 2018-07-23 ENCOUNTER — Inpatient Hospital Stay: Payer: BC Managed Care – PPO | Attending: Hematology

## 2018-07-23 ENCOUNTER — Encounter: Payer: Self-pay | Admitting: Hematology

## 2018-07-23 ENCOUNTER — Inpatient Hospital Stay (HOSPITAL_BASED_OUTPATIENT_CLINIC_OR_DEPARTMENT_OTHER): Payer: BC Managed Care – PPO | Admitting: Hematology

## 2018-07-23 ENCOUNTER — Inpatient Hospital Stay: Payer: BC Managed Care – PPO

## 2018-07-23 ENCOUNTER — Other Ambulatory Visit: Payer: Self-pay

## 2018-07-23 ENCOUNTER — Telehealth: Payer: Self-pay | Admitting: Hematology

## 2018-07-23 VITALS — BP 186/103 | HR 79 | Temp 98.0°F | Resp 17 | Ht 65.0 in | Wt 164.2 lb

## 2018-07-23 VITALS — BP 158/90

## 2018-07-23 DIAGNOSIS — Z5112 Encounter for antineoplastic immunotherapy: Secondary | ICD-10-CM | POA: Insufficient documentation

## 2018-07-23 DIAGNOSIS — E6609 Other obesity due to excess calories: Secondary | ICD-10-CM | POA: Insufficient documentation

## 2018-07-23 DIAGNOSIS — C19 Malignant neoplasm of rectosigmoid junction: Secondary | ICD-10-CM

## 2018-07-23 DIAGNOSIS — R51 Headache: Secondary | ICD-10-CM

## 2018-07-23 DIAGNOSIS — D649 Anemia, unspecified: Secondary | ICD-10-CM | POA: Insufficient documentation

## 2018-07-23 DIAGNOSIS — E119 Type 2 diabetes mellitus without complications: Secondary | ICD-10-CM | POA: Insufficient documentation

## 2018-07-23 DIAGNOSIS — I10 Essential (primary) hypertension: Secondary | ICD-10-CM

## 2018-07-23 DIAGNOSIS — F329 Major depressive disorder, single episode, unspecified: Secondary | ICD-10-CM | POA: Insufficient documentation

## 2018-07-23 DIAGNOSIS — G8929 Other chronic pain: Secondary | ICD-10-CM

## 2018-07-23 DIAGNOSIS — G4700 Insomnia, unspecified: Secondary | ICD-10-CM | POA: Diagnosis not present

## 2018-07-23 DIAGNOSIS — Z5111 Encounter for antineoplastic chemotherapy: Secondary | ICD-10-CM | POA: Insufficient documentation

## 2018-07-23 DIAGNOSIS — Z79899 Other long term (current) drug therapy: Secondary | ICD-10-CM | POA: Diagnosis not present

## 2018-07-23 DIAGNOSIS — R519 Headache, unspecified: Secondary | ICD-10-CM | POA: Insufficient documentation

## 2018-07-23 DIAGNOSIS — F419 Anxiety disorder, unspecified: Secondary | ICD-10-CM | POA: Diagnosis not present

## 2018-07-23 DIAGNOSIS — E039 Hypothyroidism, unspecified: Secondary | ICD-10-CM

## 2018-07-23 DIAGNOSIS — D500 Iron deficiency anemia secondary to blood loss (chronic): Secondary | ICD-10-CM

## 2018-07-23 DIAGNOSIS — Z7189 Other specified counseling: Secondary | ICD-10-CM

## 2018-07-23 DIAGNOSIS — C187 Malignant neoplasm of sigmoid colon: Secondary | ICD-10-CM

## 2018-07-23 DIAGNOSIS — Z794 Long term (current) use of insulin: Secondary | ICD-10-CM | POA: Insufficient documentation

## 2018-07-23 DIAGNOSIS — C787 Secondary malignant neoplasm of liver and intrahepatic bile duct: Secondary | ICD-10-CM | POA: Diagnosis not present

## 2018-07-23 DIAGNOSIS — E876 Hypokalemia: Secondary | ICD-10-CM | POA: Insufficient documentation

## 2018-07-23 LAB — CBC WITH DIFFERENTIAL (CANCER CENTER ONLY)
Abs Immature Granulocytes: 0.05 10*3/uL (ref 0.00–0.07)
Basophils Absolute: 0.1 10*3/uL (ref 0.0–0.1)
Basophils Relative: 1 %
Eosinophils Absolute: 0.3 10*3/uL (ref 0.0–0.5)
Eosinophils Relative: 3 %
HCT: 41.1 % (ref 36.0–46.0)
Hemoglobin: 13.7 g/dL (ref 12.0–15.0)
Immature Granulocytes: 1 %
Lymphocytes Relative: 26 %
Lymphs Abs: 2.3 10*3/uL (ref 0.7–4.0)
MCH: 30.4 pg (ref 26.0–34.0)
MCHC: 33.3 g/dL (ref 30.0–36.0)
MCV: 91.3 fL (ref 80.0–100.0)
Monocytes Absolute: 0.5 10*3/uL (ref 0.1–1.0)
Monocytes Relative: 5 %
Neutro Abs: 5.8 10*3/uL (ref 1.7–7.7)
Neutrophils Relative %: 64 %
Platelet Count: 227 10*3/uL (ref 150–400)
RBC: 4.5 MIL/uL (ref 3.87–5.11)
RDW: 16.3 % — ABNORMAL HIGH (ref 11.5–15.5)
WBC Count: 8.9 10*3/uL (ref 4.0–10.5)
nRBC: 0 % (ref 0.0–0.2)

## 2018-07-23 LAB — CMP (CANCER CENTER ONLY)
ALT: 13 U/L (ref 0–44)
AST: 17 U/L (ref 15–41)
Albumin: 3.7 g/dL (ref 3.5–5.0)
Alkaline Phosphatase: 133 U/L — ABNORMAL HIGH (ref 38–126)
Anion gap: 11 (ref 5–15)
BUN: 10 mg/dL (ref 6–20)
CO2: 26 mmol/L (ref 22–32)
Calcium: 8.8 mg/dL — ABNORMAL LOW (ref 8.9–10.3)
Chloride: 106 mmol/L (ref 98–111)
Creatinine: 0.77 mg/dL (ref 0.44–1.00)
GFR, Est AFR Am: >60 mL/min (ref >60)
GFR, Estimated: >60 mL/min (ref >60)
Glucose, Bld: 150 mg/dL — ABNORMAL HIGH (ref 70–99)
Potassium: 3.6 mmol/L (ref 3.5–5.1)
Sodium: 143 mmol/L (ref 135–145)
Total Bilirubin: 0.4 mg/dL (ref 0.3–1.2)
Total Protein: 7.5 g/dL (ref 6.5–8.1)

## 2018-07-23 LAB — TOTAL PROTEIN, URINE DIPSTICK

## 2018-07-23 LAB — IRON AND TIBC
Iron: 35 ug/dL — ABNORMAL LOW (ref 41–142)
Saturation Ratios: 9 % — ABNORMAL LOW (ref 21–57)
TIBC: 409 ug/dL (ref 236–444)
UIBC: 373 ug/dL (ref 120–384)

## 2018-07-23 LAB — CEA (IN HOUSE-CHCC): CEA (CHCC-In House): 86.2 ng/mL — ABNORMAL HIGH (ref 0.00–5.00)

## 2018-07-23 LAB — FERRITIN: Ferritin: 13 ng/mL (ref 11–307)

## 2018-07-23 LAB — MAGNESIUM: Magnesium: 2 mg/dL (ref 1.7–2.4)

## 2018-07-23 MED ORDER — ONDANSETRON HCL 4 MG/2ML IJ SOLN
INTRAMUSCULAR | Status: AC
  Filled 2018-07-23: qty 4

## 2018-07-23 MED ORDER — SODIUM CHLORIDE 0.9 % IV SOLN: 8.0000 mg | Freq: Once | INTRAVENOUS | Status: DC

## 2018-07-23 MED ORDER — CLONIDINE HCL 0.1 MG PO TABS
ORAL_TABLET | ORAL | Status: AC
  Filled 2018-07-23: qty 1

## 2018-07-23 MED ORDER — ONDANSETRON HCL 4 MG/2ML IJ SOLN
8.0000 mg | Freq: Once | INTRAMUSCULAR | Status: AC
  Administered 2018-07-23: 8 mg via INTRAVENOUS
  Filled 2018-07-23: qty 2

## 2018-07-23 MED ORDER — AMLODIPINE BESYLATE 5 MG PO TABS: 5.0000 mg | ORAL_TABLET | Freq: Every day | ORAL | 0 refills | Status: DC

## 2018-07-23 MED ORDER — DEXAMETHASONE SODIUM PHOSPHATE 10 MG/ML IJ SOLN
8.0000 mg | Freq: Once | INTRAMUSCULAR | Status: AC
  Administered 2018-07-23: 8 mg via INTRAVENOUS

## 2018-07-23 MED ORDER — SODIUM CHLORIDE 0.9 % IV SOLN
400.0000 mg | Freq: Once | INTRAVENOUS | Status: AC
  Administered 2018-07-23: 400 mg via INTRAVENOUS
  Filled 2018-07-23: qty 16

## 2018-07-23 MED ORDER — DEXAMETHASONE SODIUM PHOSPHATE 10 MG/ML IJ SOLN
INTRAMUSCULAR | Status: AC
  Filled 2018-07-23: qty 1

## 2018-07-23 MED ORDER — CLONIDINE HCL 0.1 MG PO TABS
0.1000 mg | ORAL_TABLET | Freq: Once | ORAL | Status: AC
  Administered 2018-07-23: 0.1 mg via ORAL

## 2018-07-23 MED ORDER — SODIUM CHLORIDE 0.9 % IV SOLN
Freq: Once | INTRAVENOUS | Status: AC
  Administered 2018-07-23: 13:00:00 via INTRAVENOUS
  Filled 2018-07-23: qty 250

## 2018-07-23 NOTE — Patient Instructions (Signed)
[facility] Discharge Instructions for Patients Receiving Chemotherapy  Today you received the following chemotherapy agents avastin  To help prevent nausea and vomiting after your treatment, we encourage you to take your nausea medication as directed  If you develop nausea and vomiting that is not controlled by your nausea medication, call the clinic.   BELOW ARE SYMPTOMS THAT SHOULD BE REPORTED IMMEDIATELY:  *FEVER GREATER THAN 100.5 F  *CHILLS WITH OR WITHOUT FEVER  NAUSEA AND VOMITING THAT IS NOT CONTROLLED WITH YOUR NAUSEA MEDICATION  *UNUSUAL SHORTNESS OF BREATH  *UNUSUAL BRUISING OR BLEEDING  TENDERNESS IN MOUTH AND THROAT WITH OR WITHOUT PRESENCE OF ULCERS  *URINARY PROBLEMS  *BOWEL PROBLEMS  UNUSUAL RASH Items with * indicate a potential emergency and should be followed up as soon as possible.  Feel free to call the clinic should you have any questions or concerns. The clinic phone number is [phone].  Please show the [facility] at check-in to the Emergency Department and triage nurse.   Hypertension Hypertension, commonly called high blood pressure, is when the force of blood pumping through the arteries is too strong. The arteries are the blood vessels that carry blood from the heart throughout the body. Hypertension forces the heart to work harder to pump blood and may cause arteries to become narrow or stiff. Having untreated or uncontrolled hypertension can cause heart attacks, strokes, kidney disease, and other problems. A blood pressure reading consists of a higher number over a lower number. Ideally, your blood pressure should be below 120/80. The first ("top") number is called the systolic pressure. It is a measure of the pressure in your arteries as your heart beats. The second ("bottom") number is called the diastolic pressure. It is a measure of the pressure in your arteries as the heart relaxes. What are the causes? The cause of  this condition is not known. What increases the risk? Some risk factors for high blood pressure are under your control. Others are not. Factors you can change  Smoking.  Having type 2 diabetes mellitus, high cholesterol, or both.  Not getting enough exercise or physical activity.  Being overweight.  Having too much fat, sugar, calories, or salt (sodium) in your diet.  Drinking too much alcohol. Factors that are difficult or impossible to change  Having chronic kidney disease.  Having a family history of high blood pressure.  Age. Risk increases with age.  Race. You may be at higher risk if you are African-American.  Gender. Men are at higher risk than women before age 12. After age 41, women are at higher risk than men.  Having obstructive sleep apnea.  Stress. What are the signs or symptoms? Extremely high blood pressure (hypertensive crisis) may cause:  Headache.  Anxiety.  Shortness of breath.  Nosebleed.  Nausea and vomiting.  Severe chest pain.  Jerky movements you cannot control (seizures). How is this diagnosed? This condition is diagnosed by measuring your blood pressure while you are seated, with your arm resting on a surface. The cuff of the blood pressure monitor will be placed directly against the skin of your upper arm at the level of your heart. It should be measured at least twice using the same arm. Certain conditions can cause a difference in blood pressure between your right and left arms. Certain factors can cause blood pressure readings to be lower or higher than normal (elevated) for a short period of time:  When your blood pressure is higher when  you are in a health care provider's office than when you are at home, this is called white coat hypertension. Most people with this condition do not need medicines.  When your blood pressure is higher at home than when you are in a health care provider's office, this is called masked hypertension.  Most people with this condition may need medicines to control blood pressure. If you have a high blood pressure reading during one visit or you have normal blood pressure with other risk factors:  You may be asked to return on a different day to have your blood pressure checked again.  You may be asked to monitor your blood pressure at home for 1 week or longer. If you are diagnosed with hypertension, you may have other blood or imaging tests to help your health care provider understand your overall risk for other conditions. How is this treated? This condition is treated by making healthy lifestyle changes, such as eating healthy foods, exercising more, and reducing your alcohol intake. Your health care provider may prescribe medicine if lifestyle changes are not enough to get your blood pressure under control, and if:  Your systolic blood pressure is above 130.  Your diastolic blood pressure is above 80. Your personal target blood pressure may vary depending on your medical conditions, your age, and other factors. Follow these instructions at home: Eating and drinking   Eat a diet that is high in fiber and potassium, and low in sodium, added sugar, and fat. An example eating plan is called the DASH (Dietary Approaches to Stop Hypertension) diet. To eat this way: ? Eat plenty of fresh fruits and vegetables. Try to fill half of your plate at each meal with fruits and vegetables. ? Eat whole grains, such as whole wheat pasta, brown rice, or whole grain bread. Fill about one quarter of your plate with whole grains. ? Eat or drink low-fat dairy products, such as skim milk or low-fat yogurt. ? Avoid fatty cuts of meat, processed or cured meats, and poultry with skin. Fill about one quarter of your plate with lean proteins, such as fish, chicken without skin, beans, eggs, and tofu. ? Avoid premade and processed foods. These tend to be higher in sodium, added sugar, and fat.  Reduce your daily  sodium intake. Most people with hypertension should eat less than 1,500 mg of sodium a day.  Limit alcohol intake to no more than 1 drink a day for nonpregnant women and 2 drinks a day for men. One drink equals 12 oz of beer, 5 oz of wine, or 1 oz of hard liquor. Lifestyle   Work with your health care provider to maintain a healthy body weight or to lose weight. Ask what an ideal weight is for you.  Get at least 30 minutes of exercise that causes your heart to beat faster (aerobic exercise) most days of the week. Activities may include walking, swimming, or biking.  Include exercise to strengthen your muscles (resistance exercise), such as pilates or lifting weights, as part of your weekly exercise routine. Try to do these types of exercises for 30 minutes at least 3 days a week.  Do not use any products that contain nicotine or tobacco, such as cigarettes and e-cigarettes. If you need help quitting, ask your health care provider.  Monitor your blood pressure at home as told by your health care provider.  Keep all follow-up visits as told by your health care provider. This is important. Medicines  Take over-the-counter  and prescription medicines only as told by your health care provider. Follow directions carefully. Blood pressure medicines must be taken as prescribed.  Do not skip doses of blood pressure medicine. Doing this puts you at risk for problems and can make the medicine less effective.  Ask your health care provider about side effects or reactions to medicines that you should watch for. Contact a health care provider if:  You think you are having a reaction to a medicine you are taking.  You have headaches that keep coming back (recurring).  You feel dizzy.  You have swelling in your ankles.  You have trouble with your vision. Get help right away if:  You develop a severe headache or confusion.  You have unusual weakness or numbness.  You feel faint.  You have  severe pain in your chest or abdomen.  You vomit repeatedly.  You have trouble breathing. Summary  Hypertension is when the force of blood pumping through your arteries is too strong. If this condition is not controlled, it may put you at risk for serious complications.  Your personal target blood pressure may vary depending on your medical conditions, your age, and other factors. For most people, a normal blood pressure is less than 120/80.  Hypertension is treated with lifestyle changes, medicines, or a combination of both. Lifestyle changes include weight loss, eating a healthy, low-sodium diet, exercising more, and limiting alcohol. This information is not intended to replace advice given to you by your health care provider. Make sure you discuss any questions you have with your health care provider. Document Released: 03/10/2005 Document Revised: 02/06/2016 Document Reviewed: 02/06/2016 Elsevier Interactive Patient Education  2019 [employer].

## 2018-07-23 NOTE — Progress Notes (Signed)
Per MD Burr Medico, ok to treat with BP today

## 2018-07-23 NOTE — Telephone Encounter (Signed)
Scheduled appt per 5/1 los °

## 2018-07-23 NOTE — Progress Notes (Signed)
MD was omitting oxaliplatin this treatment day.  Changing Avastin to 5mg /kg and giving antiemetics with steriod.

## 2018-07-25 ENCOUNTER — Encounter (HOSPITAL_COMMUNITY): Payer: Self-pay | Admitting: Emergency Medicine

## 2018-07-25 ENCOUNTER — Other Ambulatory Visit: Payer: Self-pay

## 2018-07-25 ENCOUNTER — Emergency Department (HOSPITAL_COMMUNITY): Payer: BLUE CROSS/BLUE SHIELD

## 2018-07-25 ENCOUNTER — Emergency Department (HOSPITAL_COMMUNITY)
Admission: EM | Admit: 2018-07-25 | Discharge: 2018-07-26 | Disposition: A | Discharge status: Home / Self Care | Payer: BLUE CROSS/BLUE SHIELD | Attending: Emergency Medicine | Admitting: Emergency Medicine

## 2018-07-25 DIAGNOSIS — E039 Hypothyroidism, unspecified: Secondary | ICD-10-CM | POA: Diagnosis not present

## 2018-07-25 DIAGNOSIS — Z8505 Personal history of malignant neoplasm of liver: Secondary | ICD-10-CM | POA: Diagnosis not present

## 2018-07-25 DIAGNOSIS — R519 Headache, unspecified: Secondary | ICD-10-CM

## 2018-07-25 DIAGNOSIS — I10 Essential (primary) hypertension: Secondary | ICD-10-CM | POA: Diagnosis not present

## 2018-07-25 DIAGNOSIS — Z85038 Personal history of other malignant neoplasm of large intestine: Secondary | ICD-10-CM | POA: Insufficient documentation

## 2018-07-25 DIAGNOSIS — R51 Headache: Secondary | ICD-10-CM | POA: Diagnosis not present

## 2018-07-25 DIAGNOSIS — E119 Type 2 diabetes mellitus without complications: Secondary | ICD-10-CM | POA: Insufficient documentation

## 2018-07-25 DIAGNOSIS — Z794 Long term (current) use of insulin: Secondary | ICD-10-CM | POA: Diagnosis not present

## 2018-07-25 DIAGNOSIS — Z79899 Other long term (current) drug therapy: Secondary | ICD-10-CM | POA: Insufficient documentation

## 2018-07-25 LAB — BASIC METABOLIC PANEL
Anion gap: 13 (ref 5–15)
BUN: 9 mg/dL (ref 6–20)
CO2: 22 mmol/L (ref 22–32)
Calcium: 7.6 mg/dL — ABNORMAL LOW (ref 8.9–10.3)
Chloride: 99 mmol/L (ref 98–111)
Creatinine, Ser: 0.77 mg/dL (ref 0.44–1.00)
GFR calc Af Amer: >60 mL/min (ref >60)
GFR calc non Af Amer: >60 mL/min (ref >60)
Glucose, Bld: 235 mg/dL — ABNORMAL HIGH (ref 70–99)
Potassium: 3.4 mmol/L — ABNORMAL LOW (ref 3.5–5.1)
Sodium: 134 mmol/L — ABNORMAL LOW (ref 135–145)

## 2018-07-25 LAB — CBC WITH DIFFERENTIAL/PLATELET
Abs Immature Granulocytes: 0.04 10*3/uL (ref 0.00–0.07)
Basophils Absolute: 0.1 10*3/uL (ref 0.0–0.1)
Basophils Relative: 1 %
Eosinophils Absolute: 0.2 10*3/uL (ref 0.0–0.5)
Eosinophils Relative: 2 %
HCT: 42.4 % (ref 36.0–46.0)
Hemoglobin: 13.5 g/dL (ref 12.0–15.0)
Immature Granulocytes: 1 %
Lymphocytes Relative: 30 %
Lymphs Abs: 2.6 10*3/uL (ref 0.7–4.0)
MCH: 30.1 pg (ref 26.0–34.0)
MCHC: 31.8 g/dL (ref 30.0–36.0)
MCV: 94.6 fL (ref 80.0–100.0)
Monocytes Absolute: 0.5 10*3/uL (ref 0.1–1.0)
Monocytes Relative: 6 %
Neutro Abs: 5.3 10*3/uL (ref 1.7–7.7)
Neutrophils Relative %: 60 %
Platelets: 199 10*3/uL (ref 150–400)
RBC: 4.48 MIL/uL (ref 3.87–5.11)
RDW: 16.3 % — ABNORMAL HIGH (ref 11.5–15.5)
WBC: 8.7 10*3/uL (ref 4.0–10.5)
nRBC: 0 % (ref 0.0–0.2)

## 2018-07-25 LAB — URINALYSIS, ROUTINE W REFLEX MICROSCOPIC
Bacteria, UA: NONE SEEN
Bilirubin Urine: NEGATIVE
Glucose, UA: 150 mg/dL — AB
Ketones, ur: NEGATIVE mg/dL
Leukocytes,Ua: NEGATIVE
Nitrite: NEGATIVE
Protein, ur: NEGATIVE mg/dL
Specific Gravity, Urine: 1.009 (ref 1.005–1.030)
pH: 8 (ref 5.0–8.0)

## 2018-07-25 MED ORDER — METOCLOPRAMIDE HCL 5 MG/ML IJ SOLN
10.0000 mg | Freq: Once | INTRAMUSCULAR | Status: AC
  Administered 2018-07-25: 10 mg via INTRAVENOUS
  Filled 2018-07-25: qty 2

## 2018-07-25 MED ORDER — KETOROLAC TROMETHAMINE 30 MG/ML IJ SOLN
30.0000 mg | Freq: Once | INTRAMUSCULAR | Status: AC
  Administered 2018-07-25: 30 mg via INTRAVENOUS
  Filled 2018-07-25: qty 1

## 2018-07-25 MED ORDER — DIPHENHYDRAMINE HCL 50 MG/ML IJ SOLN
25.0000 mg | Freq: Once | INTRAMUSCULAR | Status: AC
  Administered 2018-07-25: 50 mg via INTRAVENOUS
  Filled 2018-07-25: qty 1

## 2018-07-25 MED ORDER — LABETALOL HCL 5 MG/ML IV SOLN
10.0000 mg | Freq: Once | INTRAVENOUS | Status: AC
  Administered 2018-07-25: 10 mg via INTRAVENOUS
  Filled 2018-07-25: qty 4

## 2018-07-25 MED ORDER — ONDANSETRON HCL 4 MG/2ML IJ SOLN
4.0000 mg | Freq: Once | INTRAMUSCULAR | Status: AC
  Administered 2018-07-25: 4 mg via INTRAVENOUS
  Filled 2018-07-25: qty 2

## 2018-07-25 NOTE — ED Notes (Signed)
Pt requesting to walk to bathroom, advised pt that she needed blood pressure meds before getting up.  Pt st's I will sign a waiver because I can't use the bedpan or purewick.

## 2018-07-25 NOTE — ED Triage Notes (Signed)
Pt to ED via [facility] with c/o headache and HTN.  Pt st's headache started 2 days ago while getting treatment at the [facility].  Pt st's headache is some better now.  Pt has taken her HTN meds today

## 2018-07-25 NOTE — ED Provider Notes (Signed)
[facility] EMERGENCY DEPARTMENT Provider Note   CSN: [id number] Arrival date & time: 07/25/18  1842    History   Chief Complaint Chief Complaint  Patient presents with  . Headache  . Hypertension    HPI Beverly Schwartz is a 48 y.o. female past medical history of colon cancer, depression, diabetes, hypertension who presents via EMS for evaluation of headache and high blood pressure.  Patient states that she checked her blood pressure at home and noted it to be elevated at 180/120.  Patient states that she was also having headache so she called EMS to bring her to the emergency department.  Patient states that her headache initially began about 3 days ago.  She states that it started out very mild and then gradually worsened.  She states that she does get headaches frequently but this 1 felt different and was much worse.  She took some Advil migraine which she states somewhat helped it.  She states that it got better after she took her chemo treatment.  She states she did not have any headache yesterday.  She states that her headache today started at about lunchtime.  Again, it started off mild and gradually worsened.  Patient states she took her blood pressure and found it to be elevated, prompting ED visit.  She is on propanolol 10 mg for her blood pressure.  She states she did not take any of it today.  She did take 20 mg of it last night before going to bed.  Patient states she has not had any vision changes, nausea/vomiting, numbness/weakness of arms or legs, chest pain, difficulty breathing.  She denies any preceding trauma, injury to the head.  Patient denies any abdominal pain.     The history is provided by the patient.    Past Medical History:  Diagnosis Date  . Anxiety   . Cancer ([place])    colon, liver  . Chest pain   . Colon cancer ([place])   . Complication of anesthesia   . Depression   . Diabetes mellitus   . Dizziness   . Family history of breast cancer    . Family history of stomach cancer   . Hyperlipidemia   . Hypertension   . Hypothyroidism   . Obesity   . PONV (postoperative nausea and vomiting)   . Tachycardia     Patient Active Problem List   Diagnosis Date Noted  . Headache 07/23/2018  . Excessive cerumen in both ear canals 05/05/2018  . Acute respiratory infection 03/30/2018  . Flu-like symptoms 03/30/2018  . Genetic testing 08/10/2017  . Family history of breast cancer   . Family history of stomach cancer   . Goals of care, counseling/discussion 06/15/2017  . 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 SIRS (systemic inflammatory response syndrome) ([place]) 05/30/2017  . Healthcare maintenance 12/10/2016  . Elevated LFTs 12/10/2016  . Generalized abdominal pain 09/30/2016  . Anxiety 09/30/2016  . Sinusitis 09/03/2016  . Diabetes mellitus without complication ([place]) 65/78/4696  . Hypothyroid 07/07/2016  . Hypertension 07/07/2016  . Hyperlipidemia 08/06/2010  . Tachycardia   . Chest pain   . Dizziness   . Obesity     Past Surgical History:  Procedure Laterality Date  . FLEXIBLE SIGMOIDOSCOPY N/A 05/31/2017   Procedure: FLEXIBLE SIGMOIDOSCOPY;  Surgeon: Clarene Essex, MD;  Location: [facility] ENDOSCOPY;  Service: Endoscopy;  Laterality: N/A;  . WISDOM TOOTH EXTRACTION     age 22's  OB History   No obstetric history on file.      Home Medications    Prior to Admission medications   Medication Sig Start Date End Date Taking? Authorizing Provider  acetaminophen-codeine (TYLENOL #3) 300-30 MG tablet Take 1 tablet by mouth every 6 (six) hours as needed for moderate pain. 12/02/17   Alla Feeling, NP  albuterol (PROVENTIL HFA;VENTOLIN HFA) 108 (90 Base) MCG/ACT inhaler Inhale 2 puffs into the lungs every 6 (six) hours as needed for wheezing or shortness of breath. 03/30/18   Danford, Valetta Fuller D, NP  ALPRAZolam (XANAX) 1 MG tablet TAKE 1 TABLET BY MOUTH AT BEDTIME AS NEEDED FOR ANXIETY 06/17/18   Danford,  Valetta Fuller D, NP  amLODipine (NORVASC) 5 MG tablet Take 1 tablet (5 mg total) by mouth daily. 07/23/18   Truitt Merle, MD  atorvastatin (LIPITOR) 10 MG tablet Take 1 tablet (10 mg total) by mouth daily. 09/15/16   Danford, Valetta Fuller D, NP  benzonatate (TESSALON) 100 MG capsule Take 1 capsule (100 mg total) by mouth 3 (three) times daily as needed for cough. 03/30/18   Danford, Valetta Fuller D, NP  capecitabine (XELODA) 500 MG tablet Take 3 tablets (1,500 mg total) by mouth 2 (two) times daily after a meal. Take for 14 days on, 7 days off, repeat every 21 days. 05/21/18   Truitt Merle, MD  cetirizine (ZYRTEC ALLERGY) 10 MG tablet Take 1 tablet (10 mg total) by mouth daily. 01/06/18   Alla Feeling, NP  CINNAMON PO Take 1 tablet by mouth daily.     [provider]  clindamycin (CLINDAGEL) 1 % gel Apply topically 2 (two) times daily. 08/14/17   Truitt Merle, MD  fluticasone (FLONASE) 50 MCG/ACT nasal spray Place 1 spray into both nostrils daily. Patient taking differently: Place 1 spray into both nostrils daily as needed for allergies.  12/10/16   Danford, Valetta Fuller D, NP  insulin NPH-regular Human (NOVOLIN 70/30) (70-30) 100 UNIT/ML injection Inject 25 Units into the skin 2 (two) times daily with a meal.     [provider]  levothyroxine (SYNTHROID, LEVOTHROID) 112 MCG tablet Take 1 tablet (112 mcg total) by mouth daily before breakfast. 05/06/18   Danford, Valetta Fuller D, NP  magnesium oxide (MAG-OX) 400 (241.3 Mg) MG tablet Take 1 tablet (400 mg total) by mouth 2 (two) times daily. 01/22/18   Alla Feeling, NP  mirtazapine (REMERON) 7.5 MG tablet Take 1 tablet (7.5 mg total) by mouth at bedtime. 07/14/18   Truitt Merle, MD  Omega-3 Fatty Acids (FISH OIL) 1000 MG CAPS Take 1,000 mg by mouth daily.     [provider]  polyethylene glycol (MIRALAX / GLYCOLAX) packet Take 17 g by mouth daily. 06/01/17   Florencia Reasons, MD  prochlorperazine (COMPAZINE) 10 MG tablet Take 1 tablet (10 mg total) by mouth every 6 (six) hours as needed  (NAUSEA). 05/17/18   Truitt Merle, MD  propranolol (INDERAL) 10 MG tablet TAKE 1 TABLET BY MOUTH EVERY DAY 07/15/18   Danford, Valetta Fuller D, NP  TURMERIC PO Take 1 capsule by mouth daily.     [provider]  urea (CARMOL) 10 % cream Apply topically as needed. 01/22/18   Alla Feeling, NP    Family History Family History  Problem Relation Age of Onset  . Heart disease Father   . Healthy Mother   . Hyperlipidemia Sister   . Healthy Brother   . Heart disease Paternal Uncle   . Heart attack Paternal Uncle   .  Healthy Son   . Breast cancer Maternal Grandmother        d. in mid 23s  . Stomach cancer Paternal Grandfather        d. 37  . Colon cancer Neg Hx   . Esophageal cancer Neg Hx   . Rectal cancer Neg Hx   . Liver cancer Neg Hx     Social History Social History   Tobacco Use  . Smoking status: Never Smoker  . Smokeless tobacco: Never Used  Substance Use Topics  . Alcohol use: No  . Drug use: No     Allergies   Bupropion and Amoxicillin   Review of Systems Review of Systems  Constitutional: Negative for fever.  Eyes: Negative for visual disturbance.  Respiratory: Negative for cough and shortness of breath.   Cardiovascular: Negative for chest pain.  Gastrointestinal: Negative for abdominal pain, nausea and vomiting.  Genitourinary: Negative for dysuria and hematuria.  Neurological: Positive for headaches. Negative for weakness and numbness.  All other systems reviewed and are negative.    Physical Exam Updated Vital Signs BP (!) 157/102   Pulse 83   Temp 98.8 F (37.1 C) (Oral)   Resp 19   Ht 5\' 5"  (1.651 m)   Wt 75.3 kg   SpO2 95%   BMI 27.62 kg/m   Physical Exam Vitals signs and nursing note reviewed.  Constitutional:      Appearance: Normal appearance. She is well-developed.  HENT:     Head: Normocephalic and atraumatic.  Eyes:     General: Lids are normal.     Conjunctiva/sclera: Conjunctivae normal.     Pupils: Pupils are equal, round,  and reactive to light.     Comments: PERRL. EOMs intact. No nystagmus.   Neck:     Musculoskeletal: Full passive range of motion without pain.  Cardiovascular:     Rate and Rhythm: Normal rate and regular rhythm.     Pulses: Normal pulses.          Radial pulses are 2+ on the right side and 2+ on the left side.       Dorsalis pedis pulses are 2+ on the right side and 2+ on the left side.     Heart sounds: Normal heart sounds. No murmur. No friction rub. No gallop.   Pulmonary:     Effort: Pulmonary effort is normal.     Breath sounds: Normal breath sounds.     Comments: Lungs clear to auscultation bilaterally.  Symmetric chest rise.  No wheezing, rales, rhonchi. Abdominal:     Palpations: Abdomen is soft. Abdomen is not rigid.     Tenderness: There is no abdominal tenderness. There is no guarding.  Musculoskeletal: Normal range of motion.  Skin:    General: Skin is warm and dry.     Capillary Refill: Capillary refill takes less than 2 seconds.  Neurological:     Mental Status: She is alert and oriented to person, place, and time.     Comments: Cranial nerves III-XII intact Follows commands, Moves all extremities  5/5 strength to BUE and BLE  Sensation intact throughout all major nerve distributions Normal coordination No slurred speech. No facial droop.   Psychiatric:        Speech: Speech normal.      ED Treatments / Results  Labs (all labs ordered are listed, but only abnormal results are displayed) Labs Reviewed  BASIC METABOLIC PANEL - Abnormal; Notable for the following components:  Result Value   Sodium 134 (*)    Potassium 3.4 (*)    Glucose, Bld 235 (*)    Calcium 7.6 (*)    All other components within normal limits  CBC WITH DIFFERENTIAL/PLATELET - Abnormal; Notable for the following components:   RDW 16.3 (*)    All other components within normal limits  URINALYSIS, ROUTINE W REFLEX MICROSCOPIC - Abnormal; Notable for the following components:    APPearance HAZY (*)    Glucose, UA 150 (*)    Hgb urine dipstick SMALL (*)    All other components within normal limits    EKG None   EKG: NSR, rate 78. No acute ST/T changes  Radiology Ct Head Wo Contrast  Result Date: 07/25/2018 CLINICAL DATA:  Acute headache, normal neurologic exam, history colon cancer, diabetes mellitus, hypertension EXAM: CT HEAD WITHOUT CONTRAST TECHNIQUE: Contiguous axial images were obtained from the base of the skull through the vertex without intravenous contrast. COMPARISON:  None FINDINGS: Brain: Minimal atrophy. Normal ventricular morphology. No midline shift or mass effect. Small vessel chronic ischemic changes of deep cerebral white matter. No intracranial hemorrhage, mass lesion, evidence of acute infarction, or extra-axial fluid collection. Vascular: Unremarkable Skull: Intact Sinuses/Orbits: Mucosal retention cyst RIGHT maxillary sinus Other: N/A IMPRESSION: Small vessel chronic ischemic changes of deep cerebral white matter. No acute intracranial abnormalities. Electronically Signed   By: Lavonia Dana M.D.   On: 07/25/2018 20:09    Procedures Procedures (including critical care time)  Medications Ordered in ED Medications  labetalol (NORMODYNE) injection 10 mg (10 mg Intravenous Given 07/25/18 2013)  metoCLOPramide (REGLAN) injection 10 mg (10 mg Intravenous Given 07/25/18 2225)  diphenhydrAMINE (BENADRYL) injection 25 mg (50 mg Intravenous Given 07/25/18 2231)  ondansetron (ZOFRAN) injection 4 mg (4 mg Intravenous Given 07/25/18 2228)  ketorolac (TORADOL) 30 MG/ML injection 30 mg (30 mg Intravenous Given 07/25/18 2229)     Initial Impression / Assessment and Plan / ED Course  I have reviewed the triage vital signs and the nursing notes.  Pertinent labs & imaging results that were available during my care of the patient were reviewed by me and considered in my medical decision making (see chart for details).        48 year old female who presents for  evaluation of hypertension and headache.  Patient reports a history of high blood pressure and is on propanolol 10 mg/day.  Has not taken her medication today.  Reports she had a headache that began on Friday.  Improved when she got her chemo medications.  Patient states that the headache returned today but was not as severe as it was a couple days ago.  She checked her blood pressure and found it elevated, prompting EMS call.  She denies any chest pain, difficulty breathing, numbness/weakness of her arms or legs, vision changes.  On initial ED arrival, she is afebrile, hypertensive with blood pressure of 214/120.  Vitals otherwise stable.  No neuro deficits noted on exam.  Patient with no chest pain, shortness of breath.  No concern for hypertensive emergency here in the department.  Suspect it is elevated secondary to her not taking her medications as well as headache..  Will give patient dose of antihypertensive here in the ED.  Will plan for labs, EKG, CT head.  CBC shows no acute abnormalities.  BMP shows potassium of 3.4.  BUN and creatinine within normal limits.  CT head shows small vessel chronic ischemic changes of deep cerebral white matter.  No other  acute abnormalities.  Patient with no signs of endorgan damage.  Blood pressure has gone down slightly at 195/102.  Evaluation.  Patient reports she still has a headache.  Will give migraine cocktail.  Reevaluation.  Patient reports that her headache has improved significantly after migraine cocktail.  Blood pressure on my evaluation shows her systolic is 338.  We will plan to p.o. challenge.  Reevaluation.  Patient's blood pressure has been trending downward.  She is currently at 157/102.  She reports headache is much improved.  I discussed with patient regarding her blood pressure medication.  She states that her doctor had wanted to bump her up from her propranolol 10 mg to a higher dose of medication.  She reports that the medication has been  called into the pharmacy but the pharmacy has been closed over the weekend so she has not been able to get the medication.  At this time, do not feel that any further changes need to be made to her medication.  Instructed her to take the medication as her primary care doctor directed. At this time, patient exhibits no emergent life-threatening condition that require further evaluation in ED or admission. Patient had ample opportunity for questions and discussion. All patient's questions were answered with full understanding. Strict return precautions discussed. Patient expresses understanding and agreement to plan.   Portions of this note were generated with [employer]. Dictation errors may occur despite best attempts at proofreading.    Final Clinical Impressions(s) / ED Diagnoses   Final diagnoses:  Acute nonintractable headache, unspecified headache type  Hypertension, unspecified type    ED Discharge Orders    None       Desma Mcgregor 07/26/18 2213    Sherwood Gambler, MD 07/29/18 1248

## 2018-07-26 NOTE — Progress Notes (Signed)
Virtual Visit via Telephone Note  I connected with Beverly Schwartz on 07/27/2018 at  1:15 PM EDT by telephone and verified that I am speaking with the correct person using two identifiers.  Location: Patient:Home  Provider:Clinic   I discussed the limitations, risks, security and privacy concerns of performing an evaluation and management service by telephone and the availability of in person appointments. I also discussed with the patient that there may be a patient responsible charge related to this service. The patient expressed understanding and agreed to proceed.   History of Present Illness: Ms. Bobst calls in today for uncontrolled HTN and uncontrolled T2D  She was at Oncology OV last week and BP was significantly elevated- 186/103 Her Oncologist added Amlodipine 5mg  QD, stopped Propanolol 10mg  QD -which she was previously on for HTN, anxiety, and migraine treatment When she established with practice she was on Losartan 50mg  QD for HTN She requested to stop Losartan during 06/01/2017 hospitalization due to Losartan recall. She was never started on ACE or re-started on Losartan.  Reports current BP 169/105- she has only had one dose of Amlodipine this morning, she just picked up rx yesterday. She denies CP/increase in dyspnea/dizziness, continues to experience intermittent migraine HA pain  She reports AM BG 80-200, Post Prandial BG 140-240s She reports taking Novlin 70/30 "when my sugars ar up", not on a consistent basis.   She has not followed up with [facility] in years. She states "I am trying not too over do it on the carbs" Inquired where she has been getting her insulin refilled from, she was unable to provide name of provider Inquired when/why she was stopped she was stopped on Metformin- she was unable to answer She reported being unable to afford Trulicity  Lab Results  Component Value Date   HGBA1C 9.0 (H) 05/31/2017   HGBA1C 8.4 09/30/2016   HGBA1C 7.9  07/07/2016   Review of Systems: General:   Denies fever, chills, unexplained weight loss.  Optho/Auditory:   Denies visual changes, blurred vision/LOV Respiratory:   Denies SOB, DOE more than baseline levels.  Cardiovascular:   Denies chest pain, palpitations, new onset peripheral edema  Gastrointestinal:   Denies nausea, vomiting, diarrhea.  Genitourinary: Denies dysuria, freq/ urgency, flank pain or discharge from genitals.  Endocrine:     Denies hot or cold intolerance, polyuria, polydipsia. Musculoskeletal:   Denies unexplained myalgias, joint swelling, unexplained arthralgias, gait problems.  Skin:  Denies rash, suspicious lesions Neurological:     Denies dizziness, unexplained weakness, numbness  Psychiatric/Behavioral:   Denies mood changes, suicidal or homicidal ideations, hallucinations This patient does not have sx concerning for COVID-19 Infection (ie; fever, chills, cough, new or worsening shortness of breath).    Patient Care Team    Relationship Specialty Notifications Start End  Bovina, Valetta Fuller D, NP PCP - General Family Medicine  07/07/16   Delrae Rend, MD Consulting Physician Endocrinology  07/07/16   Truitt Merle, MD Consulting Physician Hematology  06/05/17   Alla Feeling, NP Nurse Practitioner Nurse Practitioner  06/05/17   Clarene Essex, MD Consulting Physician Gastroenterology  06/05/17     Patient Active Problem List   Diagnosis Date Noted  . Headache 07/23/2018  . Excessive cerumen in both ear canals 05/05/2018  . Acute respiratory infection 03/30/2018  . Flu-like symptoms 03/30/2018  . Genetic testing 08/10/2017  . Family history of breast cancer   . Family history of stomach cancer   . Goals of care, counseling/discussion 06/15/2017  .  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 SIRS (systemic inflammatory response syndrome) ([place]) 05/30/2017  . Healthcare maintenance 12/10/2016  . Elevated LFTs 12/10/2016  . Generalized abdominal pain  09/30/2016  . Anxiety 09/30/2016  . Sinusitis 09/03/2016  . Diabetes mellitus without complication ([place]) 40/98/1191  . Hypothyroid 07/07/2016  . Hypertension 07/07/2016  . Hyperlipidemia 08/06/2010  . Tachycardia   . Chest pain   . Dizziness   . Obesity      Past Medical History:  Diagnosis Date  . Anxiety   . Cancer ([place])    colon, liver  . Chest pain   . Colon cancer ([place])   . Complication of anesthesia   . Depression   . Diabetes mellitus   . Dizziness   . Family history of breast cancer   . Family history of stomach cancer   . Hyperlipidemia   . Hypertension   . Hypothyroidism   . Obesity   . PONV (postoperative nausea and vomiting)   . Tachycardia      Past Surgical History:  Procedure Laterality Date  . FLEXIBLE SIGMOIDOSCOPY N/A 05/31/2017   Procedure: FLEXIBLE SIGMOIDOSCOPY;  Surgeon: Clarene Essex, MD;  Location: [facility];  Service: Endoscopy;  Laterality: N/A;  . WISDOM TOOTH EXTRACTION     age 22's     Family History  Problem Relation Age of Onset  . Heart disease Father   . Healthy Mother   . Hyperlipidemia Sister   . Healthy Brother   . Heart disease Paternal Uncle   . Heart attack Paternal Uncle   . Healthy Son   . Breast cancer Maternal Grandmother        d. in mid 85s  . Stomach cancer Paternal Grandfather        d. 67  . Colon cancer Neg Hx   . Esophageal cancer Neg Hx   . Rectal cancer Neg Hx   . Liver cancer Neg Hx      Social History   Substance and Sexual Activity  Drug Use No     Social History   Substance and Sexual Activity  Alcohol Use No     Social History   Tobacco Use  Smoking Status Never Smoker  Smokeless Tobacco Never Used     Outpatient Encounter Medications as of 07/27/2018  Medication Sig  . acetaminophen-codeine (TYLENOL #3) 300-30 MG tablet Take 1 tablet by mouth every 6 (six) hours as needed for moderate pain.  Marland Kitchen albuterol (PROVENTIL HFA;VENTOLIN HFA) 108 (90 Base) MCG/ACT inhaler Inhale 2  puffs into the lungs every 6 (six) hours as needed for wheezing or shortness of breath.  . ALPRAZolam (XANAX) 1 MG tablet TAKE 1 TABLET BY MOUTH AT BEDTIME AS NEEDED FOR ANXIETY  . amLODipine (NORVASC) 5 MG tablet Take 1 tablet (5 mg total) by mouth daily.  . benzonatate (TESSALON) 100 MG capsule Take 1 capsule (100 mg total) by mouth 3 (three) times daily as needed for cough.  . capecitabine (XELODA) 500 MG tablet Take 3 tablets (1,500 mg total) by mouth 2 (two) times daily after a meal. Take for 14 days on, 7 days off, repeat every 21 days.  . cetirizine (ZYRTEC ALLERGY) 10 MG tablet Take 1 tablet (10 mg total) by mouth daily.  Marland Kitchen CINNAMON PO Take 1 tablet by mouth daily.   . fluticasone (FLONASE) 50 MCG/ACT nasal spray Place 1 spray into both nostrils daily. (Patient taking differently: Place 1 spray into both nostrils daily as needed for allergies. )  .  insulin NPH-regular Human (70-30) 100 UNIT/ML injection 15 Units with breakfast, 8 Units with dinner  . levothyroxine (SYNTHROID, LEVOTHROID) 112 MCG tablet Take 1 tablet (112 mcg total) by mouth daily before breakfast.  . Omega-3 Fatty Acids (FISH OIL) 1000 MG CAPS Take 1,000 mg by mouth daily.   . polyethylene glycol (MIRALAX / GLYCOLAX) packet Take 17 g by mouth daily.  . prochlorperazine (COMPAZINE) 10 MG tablet Take 1 tablet (10 mg total) by mouth every 6 (six) hours as needed (NAUSEA).  . TURMERIC PO Take 1 capsule by mouth daily.   . [DISCONTINUED] insulin NPH-regular Human (NOVOLIN 70/30) (70-30) 100 UNIT/ML injection Inject 25 Units into the skin daily with breakfast. If blood sugar is high  . mirtazapine (REMERON) 7.5 MG tablet Take 1 tablet (7.5 mg total) by mouth at bedtime. (Patient not taking: Reported on 07/27/2018)  . [DISCONTINUED] atorvastatin (LIPITOR) 10 MG tablet Take 1 tablet (10 mg total) by mouth daily.  . [DISCONTINUED] clindamycin (CLINDAGEL) 1 % gel Apply topically 2 (two) times daily.  . [DISCONTINUED] magnesium oxide  (MAG-OX) 400 (241.3 Mg) MG tablet Take 1 tablet (400 mg total) by mouth 2 (two) times daily.  . [DISCONTINUED] propranolol (INDERAL) 10 MG tablet TAKE 1 TABLET BY MOUTH EVERY DAY  . [DISCONTINUED] urea (CARMOL) 10 % cream Apply topically as needed.   No facility-administered encounter medications on file as of 07/27/2018.     Allergies: Bupropion and Amoxicillin  Body mass index is 26.96 kg/m.  Blood pressure (!) 169/105, pulse 99, height 5\' 5"  (1.651 m), weight 162 lb (73.5 kg).    Observations/Objective: No acute distress noted during telephone conversation  Assessment and Plan: Continue al medications as directed, one modification- Novolin 70.30- 15 U QAM, 8 U QPM Check fasting and premeal BG Continue to check BP/HR daily- bring log of all readings to f/u in 2 weeks Remain well hydrated If BP above goal 130/80, then will add ARB  COVID-19 Education: Signs and symptoms of COVID-19 infection were discussed with pt and how to seek care for testing.  The importance of following the Stay at Home order, and when out- Social Distancing and wearing a facial mask were discussed today.  Follow Up Instructions: 2 weeks OV- instructed to wear face mask when coming into office Will send message to Cira Rue, NP/Oncology after F/u, re: HTN  I discussed the assessment and treatment plan with the patient. The patient was provided an opportunity to ask questions and all were answered. The patient agreed with the plan and demonstrated an understanding of the instructions.   The patient was advised to call back or seek an in-person evaluation if the symptoms worsen or if the condition fails to improve as anticipated.  I provided 35 minutes of non-face-to-face time during this encounter.   Esaw Grandchild, NP

## 2018-07-26 NOTE — Discharge Instructions (Signed)
Your blood pressure today improved.  Continue taking your blood pressure medications.  As we discussed, take the medications that your doctor adjusted.  Please follow-up with your primary care doctor.  Return the emergency department for any headache, chest pain, difficulty breathing, numbness/weakness of your arms or legs, vision changes or any other worsening or concerning symptoms.

## 2018-07-27 ENCOUNTER — Telehealth: Payer: Self-pay | Admitting: Nurse Practitioner

## 2018-07-27 ENCOUNTER — Ambulatory Visit (INDEPENDENT_AMBULATORY_CARE_PROVIDER_SITE_OTHER): Payer: BLUE CROSS/BLUE SHIELD | Admitting: Adult Health

## 2018-07-27 ENCOUNTER — Encounter: Payer: Self-pay | Admitting: Adult Health

## 2018-07-27 ENCOUNTER — Other Ambulatory Visit: Payer: Self-pay

## 2018-07-27 VITALS — BP 169/105 | HR 99 | Ht 65.0 in | Wt 162.0 lb

## 2018-07-27 DIAGNOSIS — E119 Type 2 diabetes mellitus without complications: Secondary | ICD-10-CM

## 2018-07-27 DIAGNOSIS — I10 Essential (primary) hypertension: Secondary | ICD-10-CM | POA: Diagnosis not present

## 2018-07-27 MED ORDER — INSULIN NPH ISOPHANE & REGULAR (70-30) 100 UNIT/ML ~~LOC~~ SUSP: SUBCUTANEOUS | 2 refills | Status: DC

## 2018-07-27 NOTE — Assessment & Plan Note (Signed)
Lab Results  Component Value Date   HGBA1C 9.0 (H) 05/31/2017   HGBA1C 8.4 09/30/2016   HGBA1C 7.9 07/07/2016  Assessment and Plan: Continue al medications as directed, one modification- Novolin 70.30- 15 U QAM, 8 U QPM Check fasting and premeal BG Continue to check BP/HR daily- bring log of all readings to f/u in 2 weeks Remain well hydrated If BP above goal 130/80, then will add ARB  COVID-19 Education: Signs and symptoms of COVID-19 infection were discussed with pt and how to seek care for testing.  The importance of following the Stay at Home order, and when out- Social Distancing and wearing a facial mask were discussed today.  Follow Up Instructions: 2 weeks OV- instructed to wear face mask when coming into office Will send message to Cira Rue, NP/Oncology after F/u, re: HTN  I discussed the assessment and treatment plan with the patient. The patient was provided an opportunity to ask questions and all were answered. The patient agreed with the plan and demonstrated an understanding of the instructions.   The patient was advised to call back or seek an in-person evaluation if the symptoms worsen or if the condition fails to improve as anticipated.  I provided 30 minutes of non-face-to-face time during this encounter.

## 2018-07-27 NOTE — Assessment & Plan Note (Signed)
Assessment and Plan: Continue al medications as directed, one modification- Novolin 70.30- 15 U QAM, 8 U QPM Check fasting and premeal BG Continue to check BP/HR daily- bring log of all readings to f/u in 2 weeks Remain well hydrated If BP above goal 130/80, then will add ARB  COVID-19 Education: Signs and symptoms of COVID-19 infection were discussed with pt and how to seek care for testing.  The importance of following the Stay at Home order, and when out- Social Distancing and wearing a facial mask were discussed today.  Follow Up Instructions: 2 weeks OV- instructed to wear face mask when coming into office Will send message to Cira Rue, NP/Oncology after F/u, re: HTN  I discussed the assessment and treatment plan with the patient. The patient was provided an opportunity to ask questions and all were answered. The patient agreed with the plan and demonstrated an understanding of the instructions.   The patient was advised to call back or seek an in-person evaluation if the symptoms worsen or if the condition fails to improve as anticipated.  I provided 30 minutes of non-face-to-face time during this encounter.

## 2018-07-27 NOTE — Telephone Encounter (Signed)
I called Beverly Schwartz to review lab results. Informed her CEA is decreased. Reviewed iron panel which is trending down. She was previously off iron because level was good. I recommend she resume 1 tab daily as long as she can tolerate it. She mentioned BP remains elevated she she went to ED yesterday. She started amlodipine 1 day ago and SBP improved. Diastolic remains >612. I recommend she continue med and check BP once daily. I will call her later this week to review. She agrees to appreciates the call.

## 2018-08-05 NOTE — Progress Notes (Signed)
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 Abdominopelvic adenopathy. Rectosigmoid mass with suggestion of partial obstruction as evidenced by large colonic stool burden more proximally. 2.  Aortic Atherosclerosis (ICD10-I70.0).  This is age advanced.    05/31/2017 Tumor Marker    CEA 353.3 (elevated) AFP 4.5 (normal)    05/31/2017 Initial Biopsy    Diagnosis Colon, biopsy, sigmoid - INVASIVE ADENOCARCINOMA - SEE COMMENT    05/31/2017 Imaging    CT CHEST IMPRESSION: 1. No evidence of metastatic disease in the chest. 2. No acute findings.  Aortic Atherosclerosis (ICD10-I70.0).     05/31/2017 Procedure    Colonoscopy per Dr. Watt Climes Findings: An infiltrative and ulcerated partially obstructing medium-sized mass was found in the recto-sigmoid colon. The mass was circumferential. The mass measured four cm in length. No bleeding was present.   Impression - One small polyp in the rectum. - The examination was otherwise normal. - Malignant partially obstructing tumor in  the recto-sigmoid colon. Biopsied. - One medium polyp in the proximal sigmoid colon. - The examination was otherwise normal. - Internal hemorrhoids.    06/03/2017 Initial Diagnosis    Malignant neoplasm of transverse colon ([place])    06/11/2017 Cancer Staging    Staging form: Colon and Rectum, AJCC 8th Edition - Clinical stage from 06/11/2017: Stage IVA (cTX, cN1b, pM1a) - Signed by Truitt Merle, MD on 06/15/2017    06/11/2017 Pathology Results    Liver biopsy confirmed metastatic colon cancer    07/23/2017 Imaging    CT CAP IMPRESSION: 1. Mild progression of hepatic metastasis. 2. Similar rectosigmoid primary with abdominopelvic nodal metastasis. 3.  No acute process or evidence of metastatic disease in the chest. 4. Aortic Atherosclerosis (ICD10-I70.0). Coronary artery atherosclerosis. This is age advanced. 5. Proximal colonic constipation, again suggesting a component of partial obstruction at the primary site. 6. Mild ascending aortic dilatation at 4.1 cm.    08/25/2017 - 05/10/2018 Chemotherapy    -Xeloda 1555m BID 1 week on and 1 week off starting 08/25/17. Due to her worsening hand-foot syndrome her dose was reduced to 15070min the AM and 100012mn the PM. Due to disease progression we swithced her to CAPOX  -Vectibix every 2 weeks starting 08/25/17. Due to skin rash will stop starting 05/10/18.      12/14/2017 Imaging    IMPRESSION: 1. Response to therapy. Marked decrease in hepatic metastatic burden. More mild decrease in abdominopelvic adenopathy and definition of rectosigmoid primary. 2.  No acute process or evidence of metastatic disease in the chest. 3.  Aortic Atherosclerosis (ICD10-I70.0).     04/28/2018 Imaging    CT CAP 04/28/18  IMPRESSION: 1. Index  liver metastases measured on the previous study show no substantial interval change although new liver lesions on today's exam are concerning for progressive disease. 2. Mild lymphadenopathy in the upper abdomen is stable. 3.  Persistent mild ill-defined wall thickening in the distal sigmoid colon near the rectosigmoid junction. 4.  Aortic Atherosclerois (ICD10-170.0)     05/17/2018 -  Chemotherapy    CAPOX every 3 weeks with Xeloda 1559m BID 2 weeks on/1week off starting 05/17/18 -Add Avastin to first cycle.       CURRENT THERAPY:  Second line CAPOX and Avantin every 3 weeks with Xeloda 1503mBID 2 weeks on/1 week off starting 05/17/18  INTERVAL HISTORY:  Beverly Schwartz here for a follow up and treatment. She presents to the clinic today by herself. She notes she still has HAs. She has been taking amlodapine 80m29mince last visit 2 weeks ago. Her BP remains eleveated. She would like to increase.  She notes she is on her off week and will start her next cycle Xeloda today. She notes small amount of blood in stool. She notes she does have blood when she blows her nose. She is fine with proceeding with Chemo but is apprehensive about avastin.  She requests to speak with our chaplin today given her anxiety.    REVIEW OF SYSTEMS:   Constitutional: Denies fevers, chills or abnormal weight loss (+) HAs Eyes: Denies blurriness of vision Ears, nose, mouth, throat, and face: Denies mucositis or sore throat (+) blood in nasal discharge  Respiratory: Denies cough, dyspnea or wheezes Cardiovascular: Denies palpitation, chest discomfort or lower extremity swelling Gastrointestinal:  Denies nausea, heartburn or change in bowel habits (+) Blood in stool  Skin: Denies abnormal skin rashes Lymphatics: Denies new lymphadenopathy or easy bruising Neurological:Denies numbness, tingling or new weaknesses (+) anxious  Behavioral/Psych: Mood is stable, no new changes  All other systems were reviewed with the patient and are negative.  MEDICAL HISTORY:  Past Medical History:  Diagnosis Date  . Anxiety   . Cancer ([facility]  colon, liver  . Chest pain   . Colon cancer ([facility] . Complication of anesthesia   . Depression    . Diabetes mellitus   . Dizziness   . Family history of breast cancer   . Family history of stomach cancer   . Hyperlipidemia   . Hypertension   . Hypothyroidism   . Obesity   . PONV (postoperative nausea and vomiting)   . Tachycardia     SURGICAL HISTORY: Past Surgical History:  Procedure Laterality Date  . FLEXIBLE SIGMOIDOSCOPY N/A 05/31/2017   Procedure: FLEXIBLE SIGMOIDOSCOPY;  Surgeon: MagClarene EssexD;  Location: [facility];  Service: Endoscopy;  Laterality: N/A;  . WISDOM TOOTH EXTRACTION     age [age]'s I have reviewed the social history and family history with the patient and they are unchanged from previous note.  ALLERGIES:  is allergic to bupropion and amoxicillin.  MEDICATIONS:  Current Outpatient Medications  Medication Sig Dispense Refill  . acetaminophen-codeine (TYLENOL #3) 300-30 MG tablet Take 1 tablet by mouth every 6 (six) hours as needed for moderate pain. 30 tablet 0  . albuterol (PROVENTIL HFA;VENTOLIN HFA) 108 (90 Base) MCG/ACT inhaler Inhale 2 puffs into the lungs every 6 (six) hours as needed for wheezing or shortness of breath. 1 Inhaler 0  . ALPRAZolam (XANAX) 1 MG tablet TAKE 1 TABLET BY MOUTH AT BEDTIME AS NEEDED FOR ANXIETY 30 tablet 0  .  amLODipine (NORVASC) 5 MG tablet Take 1 tablet (5 mg total) by mouth daily. 30 tablet 0  . benzonatate (TESSALON) 100 MG capsule Take 1 capsule (100 mg total) by mouth 3 (three) times daily as needed for cough. 20 capsule 0  . capecitabine (XELODA) 500 MG tablet Take 3 tablets (1,500 mg total) by mouth 2 (two) times daily after a meal. Take for 14 days on, 7 days off, repeat every 21 days. 84 tablet 1  . cetirizine (ZYRTEC ALLERGY) 10 MG tablet Take 1 tablet (10 mg total) by mouth daily. 30 tablet 1  . CINNAMON PO Take 1 tablet by mouth daily.     . fluticasone (FLONASE) 50 MCG/ACT nasal spray Place 1 spray into both nostrils daily. (Patient taking differently: Place 1 spray into both nostrils daily as needed  for allergies. ) 16 g 2  . insulin NPH-regular Human (70-30) 100 UNIT/ML injection 15 Units with breakfast, 8 Units with dinner 10 mL 2  . levothyroxine (SYNTHROID, LEVOTHROID) 112 MCG tablet Take 1 tablet (112 mcg total) by mouth daily before breakfast. 90 tablet 3  . mirtazapine (REMERON) 7.5 MG tablet Take 1 tablet (7.5 mg total) by mouth at bedtime. 30 tablet 0  . Omega-3 Fatty Acids (FISH OIL) 1000 MG CAPS Take 1,000 mg by mouth daily.     . polyethylene glycol (MIRALAX / GLYCOLAX) packet Take 17 g by mouth daily. 14 each 0  . prochlorperazine (COMPAZINE) 10 MG tablet Take 1 tablet (10 mg total) by mouth every 6 (six) hours as needed (NAUSEA). 30 tablet 1  . TURMERIC PO Take 1 capsule by mouth daily.      No current facility-administered medications for this visit.     PHYSICAL EXAMINATION: ECOG PERFORMANCE STATUS: 1 - Symptomatic but completely ambulatory  Vitals:   08/06/18 1122  BP: (!) 150/103  Pulse: 90  Resp: 18  Temp: 98.5 F (36.9 C)  SpO2: 100%   Filed Weights   08/06/18 1122  Weight: 163 lb 14.4 oz (74.3 kg)   GENERAL:alert, no distress and comfortable SKIN: skin color, texture, turgor are normal, no rashes or significant lesions EYES: normal, Conjunctiva are pink and non-injected, sclera clear OROPHARYNX:no exudate, no erythema and lips, buccal mucosa, and tongue normal  NECK: supple, thyroid normal size, non-tender, without nodularity LYMPH:  no palpable lymphadenopathy in the cervical, axillary or inguinal LUNGS: clear to auscultation and percussion with normal breathing effort HEART: regular rate & rhythm and no murmurs and no lower extremity edema ABDOMEN:abdomen soft, non-tender and normal bowel sounds Musculoskeletal:no cyanosis of digits and no clubbing  NEURO: alert & oriented x 3 with fluent speech, no focal motor/sensory deficits  LABORATORY DATA:  I have reviewed the data as listed CBC Latest Ref Rng & Units 08/06/2018 07/25/2018 07/23/2018  WBC 4.0 -  10.5 K/uL 6.9 8.7 8.9  Hemoglobin 12.0 - 15.0 g/dL 14.4 13.5 13.7  Hematocrit 36.0 - 46.0 % 44.2 42.4 41.1  Platelets 150 - 400 K/uL 211 199 227     CMP Latest Ref Rng & Units 08/06/2018 07/25/2018 07/23/2018  Glucose 70 - 99 mg/dL 189(H) 235(H) 150(H)  BUN 6 - 20 mg/dL _0 Creatinine 0.44 - 1.00 mg/dL 0.85 0.77 0.77  Sodium 135 - 145 mmol/L 142 134(L) 143  Potassium 3.5 - 5.1 mmol/L 3.8 3.4(L) 3.6  Chloride 98 - 111 mmol/L 106 99 106  CO2 22 - 32 mmol/L _1 Calcium 8.9 - 10.3 mg/dL 9.2 7.6(L) 8.8(L)  Total Protein 6.5 - 8.1 g/dL 7.6 - 7.5  Total Bilirubin 0.3 - 1.2 mg/dL 0.5 - 0.4  Alkaline Phos 38 - 126 U/L 145(H) - 133(H)  AST 15 - 41 U/L 15 - 17  ALT 0 - 44 U/L 16 - 13      RADIOGRAPHIC STUDIES: I have personally reviewed the radiological images as listed and agreed with the findings in the report. No results found.   ASSESSMENT & PLAN:  Beverly Schwartz is a 48 y.o. female with   1. Sigmoid adenocarcinoma, with abdominopelvic adenopathy andhepatic metastasis, stage IV, MSI-stable , KRAS/NRAS/BRAF wild type -Diagnosed in 05/2017. Treated withfirst-line chemotherapyXelodaandVectibixand tolerated well. Patient declined intensive chemo regiments previously due to fear of side effects.Unfortunatelyshe had mild disease progressionafter 8 months of therapy. -she is currently on second lineCAPOX and Avastin, she does not want a port orthe infusion pump at this point.  -She started CAPOXand Avastinon 05/17/18. Shetolerated thefirst cycle moderately well with fatigue, nausea, and muscle cramps in her handsfor 3 days, recovered well.  -She has been very reluctant to come to clinic given COVID-19concernsand has not been compliant with treatmentor follow ups lately.  --Labs reviewed, CBC WNL, CMP unremarkable except BG 189. Overall adeqaute to proceed with Oxaliplatin. Will hold avastin today due to her severe hypertension  -She will start next cycle Xeloda  today and will be back on track for CAPOX.  -f/u in 3 weeks with CT CAP a few days before   2.Hypokalemia, hypo-magnesium anemia, hypocalcemia -Probably secondary chemotherapy, especiallyVectibix -Continueoral K, Mag and Calcium. -Had resolved since she stopped vectibix  3. DM, HTN, HL -f/u with PCP -Onpropranololfor HTN currently.We discussed that Avastin can cause hypotension. -Ipreviouslyencouraged her to monitor her BP at home.  -Her HTN remains elevated in clinic. She also has been having daily migraines.  -I previously started her on 46m amlodipine (07/23/18)  -BP at 150/103 today (08/06/18). She continues to have HAs. I will increase her Amlodapine to 157mdaily.  -Will repeat BP today and if still high, We will hold avastin today   4. Financial and Social issues, Anxiety, depression -Shefollows up as needed withSocial Worker LiHollice EspyShe is currently on Xanax  -Shehas regained insurancewithBlue cross insurancethis year  5.Goal of care discussion  -She understands the goal of care is palliative, her cancer is incurable. The goal of therapy is to prolong her life and improve her quality of life. -She is full code for now   6. Hand-foot syndrome, secondary to Xeloda  -She developed mild hand-foot syndrome, secondary to Xeloda.This has much improved with a dose reduction of Xeloda. -She will continue to use Urea cream -Has much improved.   7. Insomnia, anxiety  -She has had trouble sleeping from anxiety and allergies  -She has Xanax which she uses as needed. -I recommend her to try Mirtazapine for sleep and anxiety/depression, potential side effects and benefit discussed, she agrees.I previously called in Mirtazapine7.54m71mS (07/14/18).  -Pt requested to speak with Chaplin today due to increased anxiety. I will set up contact.  -Will monitor.  8. Headaches  -pt has been having daily headaches, she thinks it's migraine -CT head from 07/25/18 was  clear  -This is mianly related to her uncontrolled hypertension. I previously called in 54mg16mlodapine (07/23/18).  -HTN remians uncotnrolled. Will increase to 10mg56may (08/06/18)   Plan -I increased Amlodipine to 10mg 73my today  -Labs reviewed and adequate to proceed with Oxaliplatin, will hold Avastin due to her uncontrolled  hypertension  -Start next cycle Xeloda today for 2 weeks  -F/u and chemo oxallipalitin and avastin in 3 weeks with lab and CT CAP with contrast a few days before OV                                             No problem-specific Assessment & Plan notes found for this encounter.   Orders Placed This Encounter  Procedures  . CT Abdomen Pelvis W Contrast    Standing Status:   Future    Standing Expiration Date:   08/06/2019    Order Specific Question:   If indicated for the ordered procedure, I authorize the administration of contrast media per Radiology protocol    Answer:   Yes    Order Specific Question:   Is patient pregnant?    Answer:   No    Order Specific Question:   Preferred imaging location?    Answer:   [facility]    Order Specific Question:   Is Oral Contrast requested for this exam?    Answer:   Yes, Per Radiology protocol    Order Specific Question:   Radiology Contrast Protocol - do NOT remove file path    Answer:   \\[email or web address]  . CT Chest W Contrast    Standing Status:   Future    Standing Expiration Date:   08/06/2019    Order Specific Question:   If indicated for the ordered procedure, I authorize the administration of contrast media per Radiology protocol    Answer:   Yes    Order Specific Question:   Is patient pregnant?    Answer:   No    Order Specific Question:   Preferred imaging location?    Answer:   [facility]    Order Specific Question:   Radiology Contrast Protocol - do NOT remove file path    Answer:   \\[email or web address]   All questions were  answered. The patient knows to call the clinic with any problems, questions or concerns. No barriers to learning was detected. I spent 20 minutes counseling the patient face to face. The total time spent in the appointment was 25 minutes and more than 50% was on counseling and review of test results     Truitt Merle, MD 08/06/2018   I, Joslyn Devon, am acting as scribe for Truitt Merle, MD.   I have reviewed the above documentation for accuracy and completeness, and I agree with the above.

## 2018-08-06 ENCOUNTER — Telehealth: Payer: Self-pay | Admitting: Hematology

## 2018-08-06 ENCOUNTER — Inpatient Hospital Stay: Payer: BC Managed Care – PPO

## 2018-08-06 ENCOUNTER — Inpatient Hospital Stay (HOSPITAL_BASED_OUTPATIENT_CLINIC_OR_DEPARTMENT_OTHER): Payer: BC Managed Care – PPO | Admitting: Hematology

## 2018-08-06 ENCOUNTER — Other Ambulatory Visit: Payer: Self-pay

## 2018-08-06 VITALS — BP 156/89 | HR 83

## 2018-08-06 VITALS — BP 150/103 | HR 90 | Temp 98.5°F | Resp 18 | Ht 65.0 in | Wt 163.9 lb

## 2018-08-06 DIAGNOSIS — C787 Secondary malignant neoplasm of liver and intrahepatic bile duct: Secondary | ICD-10-CM

## 2018-08-06 DIAGNOSIS — E876 Hypokalemia: Secondary | ICD-10-CM

## 2018-08-06 DIAGNOSIS — C19 Malignant neoplasm of rectosigmoid junction: Secondary | ICD-10-CM

## 2018-08-06 DIAGNOSIS — D500 Iron deficiency anemia secondary to blood loss (chronic): Secondary | ICD-10-CM

## 2018-08-06 DIAGNOSIS — G4700 Insomnia, unspecified: Secondary | ICD-10-CM

## 2018-08-06 DIAGNOSIS — E119 Type 2 diabetes mellitus without complications: Secondary | ICD-10-CM

## 2018-08-06 DIAGNOSIS — F329 Major depressive disorder, single episode, unspecified: Secondary | ICD-10-CM

## 2018-08-06 DIAGNOSIS — R51 Headache: Secondary | ICD-10-CM

## 2018-08-06 DIAGNOSIS — C187 Malignant neoplasm of sigmoid colon: Secondary | ICD-10-CM

## 2018-08-06 DIAGNOSIS — I10 Essential (primary) hypertension: Secondary | ICD-10-CM

## 2018-08-06 DIAGNOSIS — Z7189 Other specified counseling: Secondary | ICD-10-CM

## 2018-08-06 DIAGNOSIS — E6609 Other obesity due to excess calories: Secondary | ICD-10-CM

## 2018-08-06 DIAGNOSIS — F419 Anxiety disorder, unspecified: Secondary | ICD-10-CM

## 2018-08-06 LAB — CBC WITH DIFFERENTIAL (CANCER CENTER ONLY)
Abs Immature Granulocytes: 0.04 10*3/uL (ref 0.00–0.07)
Basophils Absolute: 0 10*3/uL (ref 0.0–0.1)
Basophils Relative: 1 %
Eosinophils Absolute: 0.2 10*3/uL (ref 0.0–0.5)
Eosinophils Relative: 3 %
HCT: 44.2 % (ref 36.0–46.0)
Hemoglobin: 14.4 g/dL (ref 12.0–15.0)
Immature Granulocytes: 1 %
Lymphocytes Relative: 27 %
Lymphs Abs: 1.9 10*3/uL (ref 0.7–4.0)
MCH: 30.6 pg (ref 26.0–34.0)
MCHC: 32.6 g/dL (ref 30.0–36.0)
MCV: 93.8 fL (ref 80.0–100.0)
Monocytes Absolute: 0.4 10*3/uL (ref 0.1–1.0)
Monocytes Relative: 6 %
Neutro Abs: 4.3 10*3/uL (ref 1.7–7.7)
Neutrophils Relative %: 62 %
Platelet Count: 211 10*3/uL (ref 150–400)
RBC: 4.71 MIL/uL (ref 3.87–5.11)
RDW: 17.2 % — ABNORMAL HIGH (ref 11.5–15.5)
WBC Count: 6.9 10*3/uL (ref 4.0–10.5)
nRBC: 0 % (ref 0.0–0.2)

## 2018-08-06 LAB — CMP (CANCER CENTER ONLY)
ALT: 16 U/L (ref 0–44)
AST: 15 U/L (ref 15–41)
Albumin: 3.6 g/dL (ref 3.5–5.0)
Alkaline Phosphatase: 145 U/L — ABNORMAL HIGH (ref 38–126)
Anion gap: 10 (ref 5–15)
BUN: 10 mg/dL (ref 6–20)
CO2: 26 mmol/L (ref 22–32)
Calcium: 9.2 mg/dL (ref 8.9–10.3)
Chloride: 106 mmol/L (ref 98–111)
Creatinine: 0.85 mg/dL (ref 0.44–1.00)
GFR, Est AFR Am: >60 mL/min (ref >60)
GFR, Estimated: >60 mL/min (ref >60)
Glucose, Bld: 189 mg/dL — ABNORMAL HIGH (ref 70–99)
Potassium: 3.8 mmol/L (ref 3.5–5.1)
Sodium: 142 mmol/L (ref 135–145)
Total Bilirubin: 0.5 mg/dL (ref 0.3–1.2)
Total Protein: 7.6 g/dL (ref 6.5–8.1)

## 2018-08-06 LAB — MAGNESIUM: Magnesium: 1.9 mg/dL (ref 1.7–2.4)

## 2018-08-06 MED ORDER — DEXAMETHASONE SODIUM PHOSPHATE 10 MG/ML IJ SOLN
INTRAMUSCULAR | Status: AC
  Filled 2018-08-06: qty 1

## 2018-08-06 MED ORDER — PALONOSETRON HCL INJECTION 0.25 MG/5ML
INTRAVENOUS | Status: AC
  Filled 2018-08-06: qty 5

## 2018-08-06 MED ORDER — OXALIPLATIN CHEMO INJECTION 100 MG/20ML
130.0000 mg/m2 | Freq: Once | INTRAVENOUS | Status: AC
  Administered 2018-08-06: 14:00:00 240 mg via INTRAVENOUS
  Filled 2018-08-06: qty 40

## 2018-08-06 MED ORDER — DEXAMETHASONE SODIUM PHOSPHATE 10 MG/ML IJ SOLN
8.0000 mg | Freq: Once | INTRAMUSCULAR | Status: AC
  Administered 2018-08-06: 13:00:00 8 mg via INTRAVENOUS

## 2018-08-06 MED ORDER — PALONOSETRON HCL INJECTION 0.25 MG/5ML
0.2500 mg | Freq: Once | INTRAVENOUS | Status: AC
  Administered 2018-08-06: 13:00:00 0.25 mg via INTRAVENOUS

## 2018-08-06 MED ORDER — CLONIDINE HCL 0.1 MG PO TABS
0.1000 mg | ORAL_TABLET | Freq: Once | ORAL | Status: AC
  Administered 2018-08-06: 0.1 mg via ORAL

## 2018-08-06 MED ORDER — DEXTROSE 5 % IV SOLN
Freq: Once | INTRAVENOUS | Status: AC
  Administered 2018-08-06: 13:00:00 via INTRAVENOUS
  Filled 2018-08-06: qty 250

## 2018-08-06 MED ORDER — CLONIDINE HCL 0.1 MG PO TABS
ORAL_TABLET | ORAL | Status: AC
  Filled 2018-08-06: qty 1

## 2018-08-06 NOTE — Patient Instructions (Signed)
[place] Discharge Instructions for Patients Receiving Chemotherapy  Today you received the following chemotherapy agents Oxaliplatin (ELOXATIN).  To help prevent nausea and vomiting after your treatment, we encourage you to take your nausea medication as prescribed.   If you develop nausea and vomiting that is not controlled by your nausea medication, call the clinic.   BELOW ARE SYMPTOMS THAT SHOULD BE REPORTED IMMEDIATELY:  *FEVER GREATER THAN 100.5 F  *CHILLS WITH OR WITHOUT FEVER  NAUSEA AND VOMITING THAT IS NOT CONTROLLED WITH YOUR NAUSEA MEDICATION  *UNUSUAL SHORTNESS OF BREATH  *UNUSUAL BRUISING OR BLEEDING  TENDERNESS IN MOUTH AND THROAT WITH OR WITHOUT PRESENCE OF ULCERS  *URINARY PROBLEMS  *BOWEL PROBLEMS  UNUSUAL RASH Items with * indicate a potential emergency and should be followed up as soon as possible.  Feel free to call the clinic should you have any questions or concerns. The clinic phone number is [phone].  Please show the [facility] at check-in to the Emergency Department and triage nurse.  Coronavirus (COVID-19) Are you at risk?  Are you at risk for the Coronavirus (COVID-19)?  To be considered HIGH RISK for Coronavirus (COVID-19), you have to meet the following criteria:  . Traveled to [place], [place], [place], [place] or [place]; or in the [place] to [place], [place], [place], or [place]; and have fever, cough, and shortness of breath within the last 2 weeks of travel OR . Been in close contact with a person diagnosed with COVID-19 within the last 2 weeks and have fever, cough, and shortness of breath . IF YOU DO NOT MEET THESE CRITERIA, YOU ARE CONSIDERED LOW RISK FOR COVID-19.  What to do if you are HIGH RISK for COVID-19?  Marland Kitchen If you are having a medical emergency, call 911. . Seek medical care right away. Before you go to a doctor's office, urgent care or emergency department, call ahead and tell them  about your recent travel, contact with someone diagnosed with COVID-19, and your symptoms. You should receive instructions from your physician's office regarding next steps of care.  . When you arrive at healthcare provider, tell the healthcare staff immediately you have returned from visiting [place], [place], [place], [place] or [place]; or traveled in the [place] to [place], [place], [place], or [place]; in the last two weeks or you have been in close contact with a person diagnosed with COVID-19 in the last 2 weeks.   . Tell the health care staff about your symptoms: fever, cough and shortness of breath. . After you have been seen by a medical provider, you will be either: o Tested for (COVID-19) and discharged home on quarantine except to seek medical care if symptoms worsen, and asked to  - Stay home and avoid contact with others until you get your results (4-5 days)  - Avoid travel on public transportation if possible (such as bus, train, or airplane) or o Sent to the Emergency Department by EMS for evaluation, COVID-19 testing, and possible admission depending on your condition and test results.  What to do if you are LOW RISK for COVID-19?  Reduce your risk of any infection by using the same precautions used for avoiding the common cold or flu:  Marland Kitchen Wash your hands often with soap and warm water for at least 20 seconds.  If soap and water are not readily available, use an alcohol-based hand sanitizer with at least 60% alcohol.  . If coughing or  sneezing, cover your mouth and nose by coughing or sneezing into the elbow areas of your shirt or coat, into a tissue or into your sleeve (not your hands). . Avoid shaking hands with others and consider head nods or verbal greetings only. . Avoid touching your eyes, nose, or mouth with unwashed hands.  . Avoid close contact with people who are sick. . Avoid places or events with large numbers of people in one location, like concerts or  sporting events. . Carefully consider travel plans you have or are making. . If you are planning any travel outside or inside the [place], visit the CDC's Travelers' Health webpage for the latest health notices. . If you have some symptoms but not all symptoms, continue to monitor at home and seek medical attention if your symptoms worsen. . If you are having a medical emergency, call 911.   [place] / e-Visit: [email or web address]         [facility]: [facility]: [phone]                   [facility]: [phone] \

## 2018-08-06 NOTE — Telephone Encounter (Signed)
Scheduled appt per 5/15 los. ° °A calendar will be mailed out. °

## 2018-08-06 NOTE — Progress Notes (Signed)
Per Dr. Burr Medico hold Avastin due to blood pressure elevation today.

## 2018-08-06 NOTE — Progress Notes (Signed)
Left voice message for Lorrin Jackson Chaplain that patient is interested in speaking to her.  In infusion right now but if she is unable to reach out to her in person perhaps she can connect with her by phone.

## 2018-08-07 ENCOUNTER — Encounter: Payer: Self-pay | Admitting: Hematology

## 2018-08-07 ENCOUNTER — Other Ambulatory Visit: Payer: Self-pay | Admitting: Hematology

## 2018-08-09 ENCOUNTER — Encounter: Payer: Self-pay | Admitting: General Practice

## 2018-08-09 NOTE — Progress Notes (Signed)
[facility] Spiritual Care Note  Referred by RN/MD for emotional support per pt request. Beverly Schwartz by phone, making space for her to share and process recent reflections, personal updates, anxieties, and coping strategies. Selling a rental house recently has been a big stress reliever; in turn, she hopes to build on acreage that her mother owns in order to promote mutual support. Having her son (34) home every other week during covid-19 remote learning has been another joy. Other past social stressors seem to have ebbed. She also reports relief at having hypertension better managed. All of this puts her in better stead for managing anxiety related to dx and covid-19. Will report her neuropathy complaints to Dr Burr Medico and request for counseling support and/or referral from [facility].   [place], [place], [facility] Pager [phone] Voicemail [phone]

## 2018-08-10 ENCOUNTER — Ambulatory Visit: Payer: BLUE CROSS/BLUE SHIELD | Admitting: Adult Health

## 2018-08-12 ENCOUNTER — Ambulatory Visit: Payer: BLUE CROSS/BLUE SHIELD | Admitting: Adult Health

## 2018-08-15 ENCOUNTER — Other Ambulatory Visit: Payer: Self-pay | Admitting: Hematology

## 2018-08-17 NOTE — Progress Notes (Signed)
Subjective:    Patient ID: Beverly Schwartz, female    DOB: Jan 17, 1971, 48 y.o.   MRN: [id number]  HPI:  Beverly Schwartz presents today for regular f/u: HTN, T2D, Anxiety. 07/27/2018 TeleMedicine appt- lengthy discussion on how to take Novolin 70/30- Novolin 70/30- 15 U QAM, 8 U QPM Check fasting and premeal BG Beverly Schwartz reports AM BG 90-120s, denies episodes of hypoglycemia. A1c today-8 will increase Insulin- 15 U with breakfast and 10 U with dinner- continue check BG daily- record daily Beverly Schwartz reports home BP readings SBP120-170s DBP 80-110s Beverly Schwartz reports mood stable, denies SI/HI Beverly Schwartz recently had appt with a chaplain for spiritual care on 08/09/2018- addressed home/living situation, challenges of home schooling her 58 year old son, and COVID-19 pandemic Beverly Schwartz recently sold her home in [place] and has placed offer on "[employer]" that Beverly Schwartz intends to place on her mothers land- which will relieve a lot of financial stress. Beverly Schwartz continues very close follow-up with her Oncology team Beverly Schwartz states "I have a spot on my foot that I need you to look at"- Beverly Schwartz is unsure how long the spot has been her foot.  Lab Results  Component Value Date   HGBA1C 8.0 (A) 08/18/2018   HGBA1C 9.0 (H) 05/31/2017   HGBA1C 8.4 09/30/2016    Patient Care Team    Relationship Specialty Notifications Start End  Mina Marble D, NP PCP - General Family Medicine  07/07/16   Delrae Rend, MD Consulting Physician Endocrinology  07/07/16   Truitt Merle, [place] Physician Hematology  06/05/17   Alla Feeling, NP Nurse Practitioner Nurse Practitioner  06/05/17   Clarene Essex, MD Consulting Physician Gastroenterology  06/05/17     Patient Active Problem List   Diagnosis Date Noted  . Headache 07/23/2018  . Excessive cerumen in both ear canals 05/05/2018  . Acute respiratory infection 03/30/2018  . Genetic testing 08/10/2017  . Family history of breast cancer   . Family history of stomach cancer   . Goals of care, counseling/discussion  06/15/2017  . 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 SIRS (systemic inflammatory response syndrome) ([place]) 05/30/2017  . Healthcare maintenance 12/10/2016  . Elevated LFTs 12/10/2016  . Generalized abdominal pain 09/30/2016  . Anxiety 09/30/2016  . Sinusitis 09/03/2016  . Diabetes mellitus without complication ([place]) 57/84/6962  . Hypothyroid 07/07/2016  . Hypertension 07/07/2016  . Hyperlipidemia 08/06/2010  . Tachycardia   . Chest pain   . Dizziness   . Obesity      Past Medical History:  Diagnosis Date  . Anxiety   . Cancer ([place])    colon, liver  . Chest pain   . Colon cancer ([place])   . Complication of anesthesia   . Depression   . Diabetes mellitus   . Dizziness   . Family history of breast cancer   . Family history of stomach cancer   . Hyperlipidemia   . Hypertension   . Hypothyroidism   . Obesity   . PONV (postoperative nausea and vomiting)   . Tachycardia      Past Surgical History:  Procedure Laterality Date  . FLEXIBLE SIGMOIDOSCOPY N/A 05/31/2017   Procedure: FLEXIBLE SIGMOIDOSCOPY;  Surgeon: Clarene Essex, MD;  Location: [facility] END[facility]COPY;  Service: Endoscopy;  Laterality: N/A;  . WISDOM TOOTH EXTRACTION     age 26's     Family History  Problem Relation Age of Onset  . Heart disease Father   . Healthy Mother   . Hyperlipidemia Sister   .  Healthy Brother   . Heart disease Paternal Uncle   . Heart attack Paternal Uncle   . Healthy Son   . Breast cancer Maternal Grandmother        d. in mid 74s  . Stomach cancer Paternal Grandfather        d. 46  . Colon cancer Neg Hx   . Esophageal cancer Neg Hx   . Rectal cancer Neg Hx   . Liver cancer Neg Hx      Social History   Substance and Sexual Activity  Drug Use No     Social History   Substance and Sexual Activity  Alcohol Use No     Social History   Tobacco Use  Smoking Status Never Smoker  Smokeless Tobacco Never Used     Outpatient Encounter Medications as  of 08/18/2018  Medication Sig  . acetaminophen-codeine (TYLENOL #3) 300-30 MG tablet Take 1 tablet by mouth every 6 (six) hours as needed for moderate pain.  Marland Kitchen albuterol (PROVENTIL HFA;VENTOLIN HFA) 108 (90 Base) MCG/ACT inhaler Inhale 2 puffs into the lungs every 6 (six) hours as needed for wheezing or shortness of breath.  . ALPRAZolam (XANAX) 1 MG tablet TAKE 1 TABLET BY MOUTH AT BEDTIME AS NEEDED FOR ANXIETY  . amLODipine (NORVASC) 5 MG tablet Take 1 tablet (5 mg total) by mouth daily.  . benzonatate (TESSALON) 100 MG capsule Take 1 capsule (100 mg total) by mouth 3 (three) times daily as needed for cough.  . capecitabine (XELODA) 500 MG tablet Take 3 tablets (1,500 mg total) by mouth 2 (two) times daily after a meal. Take for 14 days on, 7 days off, repeat every 21 days.  . cetirizine (ZYRTEC ALLERGY) 10 MG tablet Take 1 tablet (10 mg total) by mouth daily.  Marland Kitchen CINNAMON PO Take 1 tablet by mouth daily.   . fluticasone (FLONASE) 50 MCG/ACT nasal spray Place 1 spray into both nostrils daily. (Patient taking differently: Place 1 spray into both nostrils daily as needed for allergies. )  . insulin NPH-regular Human (70-30) 100 UNIT/ML injection 17 Units with breakfast, 10 Units with dinner  . levothyroxine (SYNTHROID, LEVOTHROID) 112 MCG tablet Take 1 tablet (112 mcg total) by mouth daily before breakfast.  . mirtazapine (REMERON) 7.5 MG tablet TAKE 1 TABLET (7.5 MG TOTAL) BY MOUTH AT BEDTIME.  Marland Kitchen Omega-3 Fatty Acids (FISH OIL) 1000 MG CAPS Take 1,000 mg by mouth daily.   . polyethylene glycol (MIRALAX / GLYCOLAX) packet Take 17 g by mouth daily.  . prochlorperazine (COMPAZINE) 10 MG tablet Take 1 tablet (10 mg total) by mouth every 6 (six) hours as needed (NAUSEA).  . TURMERIC PO Take 1 capsule by mouth daily.   . [DISCONTINUED] ALPRAZolam (XANAX) 1 MG tablet TAKE 1 TABLET BY MOUTH AT BEDTIME AS NEEDED FOR ANXIETY  . [DISCONTINUED] amLODipine (NORVASC) 5 MG tablet TAKE 1 TABLET BY MOUTH EVERY DAY   . [DISCONTINUED] insulin NPH-regular Human (70-30) 100 UNIT/ML injection 15 Units with breakfast, 8 Units with dinner  . lisinopril (ZESTRIL) 5 MG tablet Take 1 tablet (5 mg total) by mouth daily.  . [DISCONTINUED] amLODipine (NORVASC) 5 MG tablet Take 1 tablet (5 mg total) by mouth daily.   No facility-administered encounter medications on file as of 08/18/2018.     Allergies: Bupropion and Amoxicillin  Body mass index is 26.84 kg/m.  Blood pressure (!) 141/89, pulse (!) 111, temperature 98.8 F (37.1 C), temperature source Oral, height 5\' 5"  (1.651 m), weight 161  lb 4.8 oz (73.2 kg), SpO2 100 %.  Review of Systems  Constitutional: Positive for fatigue. Negative for activity change, appetite change, chills, diaphoresis, fever and unexpected weight change.  HENT: Negative for congestion.   Eyes: Negative for visual disturbance.  Respiratory: Positive for shortness of breath. Negative for cough and chest tightness.   Cardiovascular: Negative for chest pain, palpitations and leg swelling.  Endocrine: Negative for cold intolerance, heat intolerance, polydipsia, polyphagia and polyuria.  Neurological: Positive for headaches. Negative for dizziness.  Hematological: Does not bruise/bleed easily.  Psychiatric/Behavioral: Positive for dysphoric mood. Negative for agitation, behavioral problems, confusion, decreased concentration, hallucinations, self-injury, sleep disturbance and suicidal ideas. The patient is nervous/anxious. The patient is not hyperactive.        Objective:   Physical Exam Vitals signs and nursing note reviewed.  Constitutional:      General: Beverly Schwartz is not in acute distress.    Appearance: Normal appearance. Beverly Schwartz is not ill-appearing, toxic-appearing or diaphoretic.  HENT:     Head: Normocephalic and atraumatic.  Eyes:     Extraocular Movements: Extraocular movements intact.     Conjunctiva/sclera: Conjunctivae normal.     Pupils: Pupils are equal, round, and reactive  to light.  Cardiovascular:     Rate and Rhythm: Tachycardia present.     Pulses: Normal pulses.     Heart sounds: Normal heart sounds. No murmur. No friction rub. No gallop.   Pulmonary:     Effort: Pulmonary effort is normal. No respiratory distress.     Breath sounds: Normal breath sounds. No stridor. No wheezing, rhonchi or rales.  Chest:     Chest wall: No tenderness.  Skin:    General: Skin is warm and dry.     Capillary Refill: Capillary refill takes less than 2 seconds.     Comments: L foot- small plantar wart in center of foot No open tissue or drainage noted   Neurological:     Mental Status: Beverly Schwartz is alert and oriented to person, place, and time.  Psychiatric:        Mood and Affect: Mood normal.        Behavior: Behavior normal.        Thought Content: Thought content normal.        Judgment: Judgment normal.       Assessment & Plan:   1. Hyperlipidemia, unspecified hyperlipidemia type   2. Diabetes mellitus without complication (HCC)   3. Elevated LFTs   4. Anxiety   5. Healthcare maintenance   6. Essential hypertension     Healthcare maintenance  A1c is 8.0 today above goal of 7. Novolin 70/30 slightly increased to 15 U with breakfast and 10 U with dinner- so only increasing daily dosage by 2 units with dinner.  Please check fasting blood glucose each morning and prior to dinner daily- record on log. Microalbumin abnormal- added low dose Lisinopril 5mg  once daily. Please check your blood pressure and heart rate daily- record on log- please call clinic in two weeks with numbers. Continue Amlodipine 5mg - do not increase dosage. I will contact your Oncology team and alert them to the medication changes today and I that I will be managing your blood pressure. Again, please call clinic in 2 weeks with the following information- Daily blood pressure, heart rate, blood sugars. Call clinic sooner if blood pressure is consistently <100/60 or >140/90. Call clinic  sooner if blood glucose if consistently <90  Or >240 Please schedule follow-up office appt in 3  months. Continue close follow-up with Oncology team. Please continue social distancing and wearing a mask when in public.  Diabetes mellitus without complication ([place]) Lab Results  Component Value Date   HGBA1C 8.0 (A) 08/18/2018   HGBA1C 9.0 (H) 05/31/2017   HGBA1C 8.4 09/30/2016   A1c is 8.0 today above goal of 7. Novolin 70/30 slightly increased to 15 U with breakfast and 10 U with dinner- so only increasing daily dosage by 2 units with dinner.  Please check fasting blood glucose each morning and prior to dinner daily- record on log. Microalbumin abnormal- added low dose Lisinopril 5mg  once daily. Please check your blood pressure and heart rate daily- record on log- please call clinic in two weeks with numbers. Call clinic sooner if blood glucose if consistently <90  Or >240   Elevated LFTs Fasting labs drawn today Not currently on a statin  Hypertension Beverly Schwartz reports home BP readings SBP120-170s DBP 80-110s Please check your blood pressure and heart rate daily- record on log- please call clinic in two weeks with numbers. Continue Amlodipine 5mg - do not increase dosage. Added Lisinopril 5mg  QD today- for HTN control and microalbumin in urine I will contact your Oncology team and alert them to the medication changes today and I that I will be managing your blood pressure. Again, please call clinic in 2 weeks with the following information- Daily blood pressure, heart rate, blood sugars. Call clinic sooner if blood pressure is consistently <100/60 or >140/90.   [place] Controlled Substance Database reviewed- no aberrancies noted Alprazolam 1mg  refilled Continue with Primary school teacher as needed.    FOLLOW-UP:  Return in about 3 months (around 11/18/2018) for Regular Follow Up, HTN, Hypercholestermia, Diabetes.

## 2018-08-18 ENCOUNTER — Ambulatory Visit (INDEPENDENT_AMBULATORY_CARE_PROVIDER_SITE_OTHER): Payer: BLUE CROSS/BLUE SHIELD | Admitting: Adult Health

## 2018-08-18 ENCOUNTER — Other Ambulatory Visit: Payer: Self-pay

## 2018-08-18 ENCOUNTER — Encounter: Payer: Self-pay | Admitting: Adult Health

## 2018-08-18 VITALS — BP 141/89 | HR 111 | Temp 98.8°F | Ht 65.0 in | Wt 161.3 lb

## 2018-08-18 DIAGNOSIS — E785 Hyperlipidemia, unspecified: Secondary | ICD-10-CM | POA: Diagnosis not present

## 2018-08-18 DIAGNOSIS — Z0000 Encounter for general adult medical examination without abnormal findings: Secondary | ICD-10-CM

## 2018-08-18 DIAGNOSIS — E119 Type 2 diabetes mellitus without complications: Secondary | ICD-10-CM

## 2018-08-18 DIAGNOSIS — R945 Abnormal results of liver function studies: Secondary | ICD-10-CM

## 2018-08-18 DIAGNOSIS — F419 Anxiety disorder, unspecified: Secondary | ICD-10-CM

## 2018-08-18 DIAGNOSIS — R7989 Other specified abnormal findings of blood chemistry: Secondary | ICD-10-CM

## 2018-08-18 DIAGNOSIS — I10 Essential (primary) hypertension: Secondary | ICD-10-CM

## 2018-08-18 LAB — POCT GLYCOSYLATED HEMOGLOBIN (HGB A1C): Hemoglobin A1C: 8 % — AB (ref 4.0–5.6)

## 2018-08-18 LAB — POCT UA - MICROALBUMIN
Creatinine, POC: 300 mg/dL
Microalbumin Ur, POC: 150 mg/L

## 2018-08-18 MED ORDER — AMLODIPINE BESYLATE 5 MG PO TABS: 5.0000 mg | ORAL_TABLET | Freq: Every day | ORAL | 0 refills | Status: DC

## 2018-08-18 MED ORDER — ALPRAZOLAM 1 MG PO TABS: ORAL_TABLET | ORAL | 0 refills | Status: DC

## 2018-08-18 MED ORDER — INSULIN NPH ISOPHANE & REGULAR (70-30) 100 UNIT/ML ~~LOC~~ SUSP: SUBCUTANEOUS | 2 refills | Status: DC

## 2018-08-18 MED ORDER — LISINOPRIL 5 MG PO TABS: 5.0000 mg | ORAL_TABLET | Freq: Every day | ORAL | 0 refills | Status: DC

## 2018-08-18 MED FILL — CAPECITABINE 500 MG TABS: 500 | 21 days supply | Qty: 84 | Fill #1

## 2018-08-18 NOTE — Assessment & Plan Note (Signed)
  A1c is 8.0 today above goal of 7. Novolin 70/30 slightly increased to 15 U with breakfast and 10 U with dinner- so only increasing daily dosage by 2 units with dinner.  Please check fasting blood glucose each morning and prior to dinner daily- record on log. Microalbumin abnormal- added low dose Lisinopril 5mg  once daily. Please check your blood pressure and heart rate daily- record on log- please call clinic in two weeks with numbers. Continue Amlodipine 5mg - do not increase dosage. I will contact your Oncology team and alert them to the medication changes today and I that I will be managing your blood pressure. Again, please call clinic in 2 weeks with the following information- Daily blood pressure, heart rate, blood sugars. Call clinic sooner if blood pressure is consistently <100/60 or >140/90. Call clinic sooner if blood glucose if consistently <90  Or >240 Please schedule follow-up office appt in 3 months. Continue close follow-up with Oncology team. Please continue social distancing and wearing a mask when in public.

## 2018-08-18 NOTE — Assessment & Plan Note (Signed)
Lab Results  Component Value Date   HGBA1C 8.0 (A) 08/18/2018   HGBA1C 9.0 (H) 05/31/2017   HGBA1C 8.4 09/30/2016   A1c is 8.0 today above goal of 7. Novolin 70/30 slightly increased to 15 U with breakfast and 10 U with dinner- so only increasing daily dosage by 2 units with dinner.  Please check fasting blood glucose each morning and prior to dinner daily- record on log. Microalbumin abnormal- added low dose Lisinopril 5mg  once daily. Please check your blood pressure and heart rate daily- record on log- please call clinic in two weeks with numbers. Call clinic sooner if blood glucose if consistently <90  Or >240

## 2018-08-18 NOTE — Assessment & Plan Note (Signed)
[place] Controlled Substance Database reviewed- no aberrancies noted Alprazolam 1mg  refilled Continue with Primary school teacher as needed.

## 2018-08-18 NOTE — Assessment & Plan Note (Signed)
Fasting labs drawn today Not currently on a statin

## 2018-08-18 NOTE — Assessment & Plan Note (Signed)
She reports home BP readings SBP120-170s DBP 80-110s Please check your blood pressure and heart rate daily- record on log- please call clinic in two weeks with numbers. Continue Amlodipine 5mg - do not increase dosage. Added Lisinopril 5mg  QD today- for HTN control and microalbumin in urine I will contact your Oncology team and alert them to the medication changes today and I that I will be managing your blood pressure. Again, please call clinic in 2 weeks with the following information- Daily blood pressure, heart rate, blood sugars. Call clinic sooner if blood pressure is consistently <100/60 or >140/90.

## 2018-08-18 NOTE — Patient Instructions (Addendum)
Diabetes Mellitus and Nutrition, Adult When you have diabetes (diabetes mellitus), it is very important to have healthy eating habits because your blood sugar (glucose) levels are greatly affected by what you eat and drink. Eating healthy foods in the appropriate amounts, at about the same times every day, can help you:  Control your blood glucose.  Lower your risk of heart disease.  Improve your blood pressure.  Reach or maintain a healthy weight. Every person with diabetes is different, and each person has different needs for a meal plan. Your health care provider may recommend that you work with a diet and nutrition specialist (dietitian) to make a meal plan that is best for you. Your meal plan may vary depending on factors such as:  The calories you need.  The medicines you take.  Your weight.  Your blood glucose, blood pressure, and cholesterol levels.  Your activity level.  Other health conditions you have, such as heart or kidney disease. How do carbohydrates affect me? Carbohydrates, also called carbs, affect your blood glucose level more than any other type of food. Eating carbs naturally raises the amount of glucose in your blood. Carb counting is a method for keeping track of how many carbs you eat. Counting carbs is important to keep your blood glucose at a healthy level, especially if you use insulin or take certain oral diabetes medicines. It is important to know how many carbs you can safely have in each meal. This is different for every person. Your dietitian can help you calculate how many carbs you should have at each meal and for each snack. Foods that contain carbs include:  Bread, cereal, rice, pasta, and crackers.  Potatoes and corn.  Peas, beans, and lentils.  Milk and yogurt.  Fruit and juice.  Desserts, such as cakes, cookies, ice cream, and candy. How does alcohol affect me? Alcohol can cause a sudden decrease in blood glucose (hypoglycemia),  especially if you use insulin or take certain oral diabetes medicines. Hypoglycemia can be a life-threatening condition. Symptoms of hypoglycemia (sleepiness, dizziness, and confusion) are similar to symptoms of having too much alcohol. If your health care provider says that alcohol is safe for you, follow these guidelines:  Limit alcohol intake to no more than 1 drink per day for nonpregnant women and 2 drinks per day for men. One drink equals 12 oz of beer, 5 oz of wine, or 1 oz of hard liquor.  Do not drink on an empty stomach.  Keep yourself hydrated with water, diet soda, or unsweetened iced tea.  Keep in mind that regular soda, juice, and other mixers may contain a lot of sugar and must be counted as carbs. What are tips for following this plan?  Reading food labels  Start by checking the serving size on the "Nutrition Facts" label of packaged foods and drinks. The amount of calories, carbs, fats, and other nutrients listed on the label is based on one serving of the item. Many items contain more than one serving per package.  Check the total grams (g) of carbs in one serving. You can calculate the number of servings of carbs in one serving by dividing the total carbs by 15. For example, if a food has 30 g of total carbs, it would be equal to 2 servings of carbs.  Check the number of grams (g) of saturated and trans fats in one serving. Choose foods that have low or no amount of these fats.  Check the number of   milligrams (mg) of salt (sodium) in one serving. Most people should limit total sodium intake to less than 2,300 mg per day.  Always check the nutrition information of foods labeled as "low-fat" or "nonfat". These foods may be higher in added sugar or refined carbs and should be avoided.  Talk to your dietitian to identify your daily goals for nutrients listed on the label. Shopping  Avoid buying canned, premade, or processed foods. These foods tend to be high in fat, sodium,  and added sugar.  Shop around the outside edge of the grocery store. This includes fresh fruits and vegetables, bulk grains, fresh meats, and fresh dairy. Cooking  Use low-heat cooking methods, such as baking, instead of high-heat cooking methods like deep frying.  Cook using healthy oils, such as olive, canola, or sunflower oil.  Avoid cooking with butter, cream, or high-fat meats. Meal planning  Eat meals and snacks regularly, preferably at the same times every day. Avoid going long periods of time without eating.  Eat foods high in fiber, such as fresh fruits, vegetables, beans, and whole grains. Talk to your dietitian about how many servings of carbs you can eat at each meal.  Eat 4-6 ounces (oz) of lean protein each day, such as lean meat, chicken, fish, eggs, or tofu. One oz of lean protein is equal to: ? 1 oz of meat, chicken, or fish. ? 1 egg. ?  cup of tofu.  Eat some foods each day that contain healthy fats, such as avocado, nuts, seeds, and fish. Lifestyle  Check your blood glucose regularly.  Exercise regularly as told by your health care provider. This may include: ? 150 minutes of moderate-intensity or vigorous-intensity exercise each week. This could be brisk walking, biking, or water aerobics. ? Stretching and doing strength exercises, such as yoga or weightlifting, at least 2 times a week.  Take medicines as told by your health care provider.  Do not use any products that contain nicotine or tobacco, such as cigarettes and e-cigarettes. If you need help quitting, ask your health care provider.  Work with a Social worker or diabetes educator to identify strategies to manage stress and any emotional and social challenges. Questions to ask a health care provider  Do I need to meet with a diabetes educator?  Do I need to meet with a dietitian?  What number can I call if I have questions?  When are the best times to check my blood glucose? Where to find more  information:  American Diabetes Association: diabetes.org  Academy of Nutrition and Dietetics: www.eatright.[employer] of Diabetes and Digestive and Kidney Diseases (NIH): [employer] Summary  A healthy meal plan will help you control your blood glucose and maintain a healthy lifestyle.  Working with a diet and nutrition specialist (dietitian) can help you make a meal plan that is best for you.  Keep in mind that carbohydrates (carbs) and alcohol have immediate effects on your blood glucose levels. It is important to count carbs and to use alcohol carefully. This information is not intended to replace advice given to you by your health care provider. Make sure you discuss any questions you have with your health care provider. Document Released: 12/05/2004 Document Revised: 10/08/2016 Document Reviewed: 04/14/2016 Elsevier Interactive Patient Education  2019 [employer].   A1c is 8.0 today above goal of 7. Novolin 70/30 slightly increased to 15 U with breakfast and 10 U with dinner- so only increasing daily dosage by 2 units with dinner.  Please check fasting blood glucose each morning and prior to dinner daily- record on log. Microalbumin abnormal- added low dose Lisinopril 5mg  once daily. Please check your blood pressure and heart rate daily- record on log- please call clinic in two weeks with numbers. Continue Amlodipine 5mg - do not increase dosage. I will contact your Oncology team and alert them to the medication changes today and I that I will be managing your blood pressure. Again, please call clinic in 2 weeks with the following information- Daily blood pressure, heart rate, blood sugars. Call clinic sooner if blood pressure is consistently <100/60 or >140/90. Call clinic sooner if blood glucose if consistently <90  Or >240 Please schedule follow-up office appt in 3 months. Continue close follow-up with Oncology team. Please continue social distancing and  wearing a mask when in public. GREAT TO SEE YOU!

## 2018-08-19 LAB — LIPID PANEL
Chol/HDL Ratio: 4 ratio (ref 0.0–4.4)
Cholesterol, Total: 266 mg/dL — ABNORMAL HIGH (ref 100–199)
HDL: 67 mg/dL (ref >39)
LDL Calculated: 165 mg/dL — ABNORMAL HIGH (ref 0–99)
Triglycerides: 172 mg/dL — ABNORMAL HIGH (ref 0–149)
VLDL Cholesterol Cal: 34 mg/dL (ref 5–40)

## 2018-08-20 ENCOUNTER — Other Ambulatory Visit: Payer: Self-pay

## 2018-08-25 ENCOUNTER — Other Ambulatory Visit: Payer: BLUE CROSS/BLUE SHIELD

## 2018-08-25 NOTE — Progress Notes (Signed)
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 Abdominopelvic adenopathy. Rectosigmoid mass with suggestion of partial obstruction as evidenced by large colonic stool burden more proximally. 2.  Aortic Atherosclerosis (ICD10-I70.0).  This is age advanced.    05/31/2017 Tumor Marker    CEA 353.3 (elevated) AFP 4.5 (normal)    05/31/2017 Initial Biopsy    Diagnosis Colon, biopsy, sigmoid - INVASIVE ADENOCARCINOMA - SEE COMMENT    05/31/2017 Imaging    CT CHEST IMPRESSION: 1. No evidence of metastatic disease in the chest. 2. No acute findings.  Aortic Atherosclerosis (ICD10-I70.0).     05/31/2017 Procedure    Colonoscopy per Dr. Watt Climes Findings: An infiltrative and ulcerated partially obstructing medium-sized mass was found in the recto-sigmoid colon. The mass was circumferential. The mass measured four cm in length. No bleeding was present.   Impression - One small polyp in the rectum. - The examination was otherwise normal. - Malignant partially obstructing tumor in the  recto-sigmoid colon. Biopsied. - One medium polyp in the proximal sigmoid colon. - The examination was otherwise normal. - Internal hemorrhoids.    06/03/2017 Initial Diagnosis    Malignant neoplasm of transverse colon ([place])    06/11/2017 Cancer Staging    Staging form: Colon and Rectum, AJCC 8th Edition - Clinical stage from 06/11/2017: Stage IVA (cTX, cN1b, pM1a) - Signed by Truitt Merle, MD on 06/15/2017    06/11/2017 Pathology Results    Liver biopsy confirmed metastatic colon cancer    07/23/2017 Imaging    CT CAP IMPRESSION: 1. Mild progression of hepatic metastasis. 2. Similar rectosigmoid primary with abdominopelvic nodal metastasis. 3.  No acute process or evidence of metastatic disease in the chest. 4. Aortic Atherosclerosis (ICD10-I70.0). Coronary artery atherosclerosis. This is age advanced. 5. Proximal colonic constipation, again suggesting a component of partial obstruction at the primary site. 6. Mild ascending aortic dilatation at 4.1 cm.    08/25/2017 - 05/10/2018 Chemotherapy    -Xeloda '1500mg'$  BID 1 week on and 1 week off starting 08/25/17. Due to her worsening hand-foot syndrome her dose was reduced to '1500mg'$  in the AM and '1000mg'$  in the PM. Due to disease progression we swithced her to CAPOX  -Vectibix every 2 weeks starting 08/25/17. Due to skin rash will stop starting 05/10/18.      12/14/2017 Imaging    IMPRESSION: 1. Response to therapy. Marked decrease in hepatic metastatic burden. More mild decrease in abdominopelvic adenopathy and definition of rectosigmoid primary. 2.  No acute process or evidence of metastatic disease in the chest. 3.  Aortic Atherosclerosis (ICD10-I70.0).     04/28/2018 Imaging    CT CAP 04/28/18  IMPRESSION: 1. Index  liver metastases measured on the previous study show no substantial interval change although new liver lesions on today's exam are concerning for progressive disease. 2. Mild lymphadenopathy in the upper abdomen is stable. 3.  Persistent mild ill-defined wall thickening in the distal sigmoid colon near the rectosigmoid junction. 4.  Aortic Atherosclerois (ICD10-170.0)     05/17/2018 -  Chemotherapy    CAPOX every 3 weeks with Xeloda '1500mg'$  BID 2 weeks on/1week off starting 05/17/18 -Add Avastin to first cycle.       CURRENT THERAPY:  Second line CAPOX and Avantin every 3 weeks with Xeloda '1500mg'$  BID 2 weeks on/1 week off starting 05/17/18  INTERVAL HISTORY:  Beverly Schwartz is here for a follow up and treatment. She presents to the clinic alone. She notes after last chemo she had nausea and neuropathy mainly with numbness. This last for 4-5 days. She notes a wart on the sole of her left foot. She notes she has left ear discomfort. She has wax and feels like water in her ear. She will take miralax and Senakot for her manageable constipation.     REVIEW OF SYSTEMS:   Constitutional: Denies fevers, chills or abnormal weight loss Eyes: Denies blurriness of vision Ears, nose, mouth, throat, and face: Denies mucositis or sore throat (+) She has wax and feels like water in her ear Respiratory: Denies cough, dyspnea or wheezes Cardiovascular: Denies palpitation, chest discomfort or lower extremity swelling Gastrointestinal:  Denies nausea, heartburn (+) Manageable constipation  Skin: Denies abnormal skin rashes (+) wart of left sole of foot  Lymphatics: Denies new lymphadenopathy or easy bruising Neurological:Denies numbness, tingling or new weaknesses Behavioral/Psych: Mood is stable, no new changes  All other systems were reviewed with the patient and are negative.  MEDICAL HISTORY:  Past Medical History:  Diagnosis Date  . Anxiety   . Cancer ([place])    colon, liver  . Chest pain   . Colon cancer ([place])   . Complication of anesthesia   . Depression   . Diabetes mellitus   . Dizziness   . Family history of breast cancer   . Family history of stomach cancer   . Hyperlipidemia   . Hypertension   .  Hypothyroidism   . Obesity   . PONV (postoperative nausea and vomiting)   . Tachycardia     SURGICAL HISTORY: Past Surgical History:  Procedure Laterality Date  . FLEXIBLE SIGMOIDOSCOPY N/A 05/31/2017   Procedure: FLEXIBLE SIGMOIDOSCOPY;  Surgeon: Clarene Essex, MD;  Location: [facility];  Service: Endoscopy;  Laterality: N/A;  . WISDOM TOOTH EXTRACTION     age 60's    I have reviewed the social history and family history with the patient and they are unchanged from previous note.  ALLERGIES:  is allergic to bupropion and amoxicillin.  MEDICATIONS:  Current Outpatient Medications  Medication Sig Dispense Refill  . acetaminophen-codeine (TYLENOL #3) 300-30 MG tablet Take 1 tablet by mouth every 6 (six) hours as needed for moderate pain. 30 tablet 0  . albuterol (PROVENTIL HFA;VENTOLIN HFA) 108 (90 Base) MCG/ACT inhaler Inhale 2 puffs into the lungs every 6 (six) hours as needed for wheezing or shortness of breath. 1 Inhaler 0  . ALPRAZolam (XANAX) 1 MG tablet TAKE 1 TABLET BY MOUTH AT BEDTIME AS NEEDED FOR ANXIETY 30 tablet 0  . amLODipine (NORVASC) 5 MG tablet Take 1 tablet (5 mg total) by mouth daily. 90 tablet 0  . benzonatate (TESSALON) 100 MG capsule Take 1 capsule (100 mg  total) by mouth 3 (three) times daily as needed for cough. 20 capsule 0  . capecitabine (XELODA) 500 MG tablet Take 3 tablets (1,500 mg total) by mouth 2 (two) times daily after a meal. Take for 14 days on, 7 days off, repeat every 21 days. 84 tablet 1  . cetirizine (ZYRTEC ALLERGY) 10 MG tablet Take 1 tablet (10 mg total) by mouth daily. 30 tablet 1  . CINNAMON PO Take 1 tablet by mouth daily.     . fluticasone (FLONASE) 50 MCG/ACT nasal spray Place 1 spray into both nostrils daily. (Patient taking differently: Place 1 spray into both nostrils daily as needed for allergies. ) 16 g 2  . insulin NPH-regular Human (70-30) 100 UNIT/ML injection 17 Units with breakfast, 10 Units with dinner 10 mL 2  . levothyroxine  (SYNTHROID, LEVOTHROID) 112 MCG tablet Take 1 tablet (112 mcg total) by mouth daily before breakfast. 90 tablet 3  . lisinopril (ZESTRIL) 5 MG tablet Take 1 tablet (5 mg total) by mouth daily. 90 tablet 0  . mirtazapine (REMERON) 7.5 MG tablet TAKE 1 TABLET (7.5 MG TOTAL) BY MOUTH AT BEDTIME. 90 tablet 1  . Omega-3 Fatty Acids (FISH OIL) 1000 MG CAPS Take 1,000 mg by mouth daily.     . polyethylene glycol (MIRALAX / GLYCOLAX) packet Take 17 g by mouth daily. 14 each 0  . prochlorperazine (COMPAZINE) 10 MG tablet Take 1 tablet (10 mg total) by mouth every 6 (six) hours as needed (NAUSEA). 30 tablet 1  . TURMERIC PO Take 1 capsule by mouth daily.      No current facility-administered medications for this visit.    Facility-Administered Medications Ordered in Other Visits  Medication Dose Route Frequency Provider Last Rate Last Dose  . 0.9 %  sodium chloride infusion   Intravenous Continuous Truitt Merle, MD 10 mL/hr at 08/27/18 1242    . dextrose 5 % solution   Intravenous Once Truitt Merle, MD      . oxaliplatin (ELOXATIN) 240 mg in dextrose 5 % 500 mL chemo infusion  130 mg/m2 (Treatment Plan Recorded) Intravenous Once Truitt Merle, MD        PHYSICAL EXAMINATION: ECOG PERFORMANCE STATUS: 1 - Symptomatic but completely ambulatory  Vitals:   08/27/18 1120  BP: (!) 126/93  Pulse: (!) 103  Resp: 20  Temp: 99.2 F (37.3 C)  SpO2: 99%   Filed Weights   08/27/18 1120  Weight: 163 lb (73.9 kg)    GENERAL:alert, no distress and comfortable SKIN: skin color, texture, turgor are normal, no rashes or significant lesions (+) Redness of face and left ear EARS: (+) Moderate cerumen of left ear, mildly tender  EYES: normal, Conjunctiva are pink and non-injected, sclera clear  NECK: supple, thyroid normal size, non-tender, without nodularity LYMPH:  no palpable lymphadenopathy in the cervical, axillary  LUNGS: clear to auscultation and percussion with normal breathing effort HEART: regular rate &  rhythm and no murmurs and no lower extremity edema ABDOMEN:abdomen soft, non-tender and normal bowel sounds Musculoskeletal:no cyanosis of digits and no clubbing  NEURO: alert & oriented x 3 with fluent speech, no focal motor/sensory deficits  LABORATORY DATA:  I have reviewed the data as listed CBC Latest Ref Rng & Units 08/27/2018 08/06/2018 07/25/2018  WBC 4.0 - 10.5 K/uL 6.3 6.9 8.7  Hemoglobin 12.0 - 15.0 g/dL 13.1 14.4 13.5  Hematocrit 36.0 - 46.0 % 40.3 44.2 42.4  Platelets 150 - 400 K/uL 209 211 199  CMP Latest Ref Rng & Units 08/27/2018 08/06/2018 07/25/2018  Glucose 70 - 99 mg/dL 159(H) 189(H) 235(H)  BUN 6 - 20 mg/dL '11 10 9  '$ Creatinine 0.44 - 1.00 mg/dL 0.83 0.85 0.77  Sodium 135 - 145 mmol/L 141 142 134(L)  Potassium 3.5 - 5.1 mmol/L 3.7 3.8 3.4(L)  Chloride 98 - 111 mmol/L 107 106 99  CO2 22 - 32 mmol/L '24 26 22  '$ Calcium 8.9 - 10.3 mg/dL 8.0(L) 9.2 7.6(L)  Total Protein 6.5 - 8.1 g/dL 7.2 7.6 -  Total Bilirubin 0.3 - 1.2 mg/dL 0.6 0.5 -  Alkaline Phos 38 - 126 U/L 147(H) 145(H) -  AST 15 - 41 U/L 19 15 -  ALT 0 - 44 U/L 16 16 -      RADIOGRAPHIC STUDIES: I have personally reviewed the radiological images as listed and agreed with the findings in the report. No results found.   ASSESSMENT & PLAN:  PRESLI FANGUY is a 48 y.o. female with   1. Sigmoid adenocarcinoma, with abdominopelvic adenopathy andhepatic metastasis, stage IV, MSI-stable , KRAS/NRAS/BRAF wild type -Diagnosed in 05/2017. Treated withfirst-line chemotherapyXelodaandVectibixand tolerated well. Patient declined intensive chemo regiments previously due to fear of side effects.Unfortunatelyshe had mild disease progressionafter 8 months of therapy. -She is currently on second lineCAPOX and Avastin, she does not want a port orthe infusion pump at this point.  -She started CAPOXand Avastinon 05/17/18. Shetolerated thefirst cycle moderately well with fatigue, nausea, and muscle cramps in her  handsfor 3 days, recovered well.  -She has been very reluctant to come to clinic given COVID-19concernsand has not been compliant with treatmentor follow ups lately. She has been more compliant lately.  -Labs reviewed, CBC and CMP WNL except CG 159, Ca 8, alk phos 147. Urine protein negative and mag normal. Iron panel and CEA still pending. Overall adequate to proceed with Oxaliplatin and Avastin today. I may add IV emend for her nausea if her insurance approves it.  -Proceed with next cycle Xeloda today for 2 weeks  -Next CT CAP scheduled for 6/30, will move to before next OV  -f/u in 3 weeks  -She complained of left ear discomfort. There was moderate cerumen in left ear on exam today (08/27/18), no discharge, not able to see the drum well   2.Hypokalemia, hypo-magnesium anemia, hypocalcemia  -Probably secondary chemotherapy, especiallyVectibix -Continueoral K, Mag and Calcium. -Had resolved since she stopped vectibix  3. DM, HTN, HL -f/u with PCP -Onpropranololfor HTN currently.We discussed that Avastin can cause hypotension. -Ipreviouslyencouraged her to monitor her BP at home. -Her HTN not controlled. She alsohasbeen having daily migraines.  -Started her on '5mg'$  amlodipine on 07/23/18. Increased her to '10mg'$  on 08/06/18.  -Her PCP recently started her on lisinopril on 08/18/18.  -BP improved to at 126/93 today (08/27/18). I encouraged her to continue medications.    4. Financial and Social issues, Anxiety, depression -Shefollows up as needed withSocial Worker Hollice Espy -She is currently on Xanax  -Shehas regained insurancewithBlue cross insurancethis year  5.Goal of care discussion  -She understands the goal of care is palliative, her cancer is incurable. The goal of therapy is to prolong her life and improve her quality of life. -She is full code for now   6. Hand-foot syndrome, secondary to Xeloda  -She developed mild hand-foot syndrome, secondary to  Xeloda.This has much improved with a dose reduction of Xeloda. -She will continue to use Urea cream -Has much improved.She does have 2 warts on her left foot  on exam today (08/27/18). Will monitor.   7. Insomnia, anxiety  -She has had trouble sleeping from anxiety and allergies  -She has Xanax which she uses as needed. -I previously prescribed her Mirtazapine for depression and insomnia in 07/2018. She has not taken it yet. She will think about it. She is also considering Cymbalta with her PCP.  -She will f/u with [place] as needed.   8. Headaches  -pt has been having daily headaches, she thinks it's migraine -CT head from 07/25/18 was clear -Possible related to uncontrolled HTN. I previously  started her on amlodipine and her PCP started her on lisinopril.  -Not mentioned today, likely improved with addition of HTN medication.   9. Nausea  -secondary to chemo  -I will change pre-med Aloxi to IV Baldomero Lamy if her insurance approves it. I discussed the possibility of acute allergic reaction.  -She will take oral dexa once daily for 3-5 days after infusion.    Plan -Labs revewied and adeqaute to proceed with Oxaliplatin and Avastin today, will add iv Emend as premed.  -Start next cycle Xeloda today for 2 weeks  -Lab, f/u, oxaliplatin and Avastin in 3 weeks   -CT CAP will move rescheduled from 6/30 to 6/24 or 6/25                                      No problem-specific Assessment & Plan notes found for this encounter.   No orders of the defined types were placed in this encounter.  All questions were answered. The patient knows to call the clinic with any problems, questions or concerns. No barriers to learning was detected. I spent 20 minutes counseling the patient face to face. The total time spent in the appointment was 25 minutes and more than 50% was on counseling and review of test results     Truitt Merle, MD 08/27/2018   I, Joslyn Devon, am acting as scribe for Truitt Merle, MD.    I have reviewed the above documentation for accuracy and completeness, and I agree with the above.

## 2018-08-27 ENCOUNTER — Inpatient Hospital Stay: Payer: BC Managed Care – PPO

## 2018-08-27 ENCOUNTER — Inpatient Hospital Stay: Payer: BC Managed Care – PPO | Attending: Hematology | Admitting: Hematology

## 2018-08-27 ENCOUNTER — Other Ambulatory Visit: Payer: Self-pay

## 2018-08-27 ENCOUNTER — Encounter: Payer: Self-pay | Admitting: Hematology

## 2018-08-27 VITALS — BP 126/93 | HR 103 | Temp 99.2°F | Resp 20 | Ht 65.0 in | Wt 163.0 lb

## 2018-08-27 VITALS — HR 98 | Temp 98.3°F

## 2018-08-27 DIAGNOSIS — C778 Secondary and unspecified malignant neoplasm of lymph nodes of multiple regions: Secondary | ICD-10-CM | POA: Insufficient documentation

## 2018-08-27 DIAGNOSIS — G4700 Insomnia, unspecified: Secondary | ICD-10-CM

## 2018-08-27 DIAGNOSIS — Z9221 Personal history of antineoplastic chemotherapy: Secondary | ICD-10-CM | POA: Diagnosis not present

## 2018-08-27 DIAGNOSIS — Z5112 Encounter for antineoplastic immunotherapy: Secondary | ICD-10-CM | POA: Diagnosis present

## 2018-08-27 DIAGNOSIS — T451X5A Adverse effect of antineoplastic and immunosuppressive drugs, initial encounter: Secondary | ICD-10-CM | POA: Diagnosis not present

## 2018-08-27 DIAGNOSIS — G620 Drug-induced polyneuropathy: Secondary | ICD-10-CM | POA: Diagnosis not present

## 2018-08-27 DIAGNOSIS — C787 Secondary malignant neoplasm of liver and intrahepatic bile duct: Secondary | ICD-10-CM | POA: Diagnosis not present

## 2018-08-27 DIAGNOSIS — F419 Anxiety disorder, unspecified: Secondary | ICD-10-CM | POA: Diagnosis not present

## 2018-08-27 DIAGNOSIS — E876 Hypokalemia: Secondary | ICD-10-CM | POA: Diagnosis not present

## 2018-08-27 DIAGNOSIS — Z95828 Presence of other vascular implants and grafts: Secondary | ICD-10-CM

## 2018-08-27 DIAGNOSIS — R110 Nausea: Secondary | ICD-10-CM | POA: Diagnosis not present

## 2018-08-27 DIAGNOSIS — C19 Malignant neoplasm of rectosigmoid junction: Secondary | ICD-10-CM

## 2018-08-27 DIAGNOSIS — C187 Malignant neoplasm of sigmoid colon: Secondary | ICD-10-CM

## 2018-08-27 DIAGNOSIS — E119 Type 2 diabetes mellitus without complications: Secondary | ICD-10-CM | POA: Diagnosis not present

## 2018-08-27 DIAGNOSIS — L271 Localized skin eruption due to drugs and medicaments taken internally: Secondary | ICD-10-CM | POA: Diagnosis not present

## 2018-08-27 DIAGNOSIS — R51 Headache: Secondary | ICD-10-CM

## 2018-08-27 DIAGNOSIS — D500 Iron deficiency anemia secondary to blood loss (chronic): Secondary | ICD-10-CM

## 2018-08-27 DIAGNOSIS — Z5111 Encounter for antineoplastic chemotherapy: Secondary | ICD-10-CM | POA: Insufficient documentation

## 2018-08-27 DIAGNOSIS — I10 Essential (primary) hypertension: Secondary | ICD-10-CM | POA: Insufficient documentation

## 2018-08-27 DIAGNOSIS — F329 Major depressive disorder, single episode, unspecified: Secondary | ICD-10-CM | POA: Diagnosis not present

## 2018-08-27 DIAGNOSIS — D649 Anemia, unspecified: Secondary | ICD-10-CM

## 2018-08-27 DIAGNOSIS — Z7189 Other specified counseling: Secondary | ICD-10-CM

## 2018-08-27 LAB — CBC WITH DIFFERENTIAL (CANCER CENTER ONLY)
Abs Immature Granulocytes: 0.03 10*3/uL (ref 0.00–0.07)
Basophils Absolute: 0.1 10*3/uL (ref 0.0–0.1)
Basophils Relative: 1 %
Eosinophils Absolute: 0.5 10*3/uL (ref 0.0–0.5)
Eosinophils Relative: 7 %
HCT: 40.3 % (ref 36.0–46.0)
Hemoglobin: 13.1 g/dL (ref 12.0–15.0)
Immature Granulocytes: 1 %
Lymphocytes Relative: 31 %
Lymphs Abs: 2 10*3/uL (ref 0.7–4.0)
MCH: 31.2 pg (ref 26.0–34.0)
MCHC: 32.5 g/dL (ref 30.0–36.0)
MCV: 96 fL (ref 80.0–100.0)
Monocytes Absolute: 0.5 10*3/uL (ref 0.1–1.0)
Monocytes Relative: 8 %
Neutro Abs: 3.3 10*3/uL (ref 1.7–7.7)
Neutrophils Relative %: 52 %
Platelet Count: 209 10*3/uL (ref 150–400)
RBC: 4.2 MIL/uL (ref 3.87–5.11)
RDW: 18.3 % — ABNORMAL HIGH (ref 11.5–15.5)
WBC Count: 6.3 10*3/uL (ref 4.0–10.5)
nRBC: 0 % (ref 0.0–0.2)

## 2018-08-27 LAB — CMP (CANCER CENTER ONLY)
ALT: 16 U/L (ref 0–44)
AST: 19 U/L (ref 15–41)
Albumin: 3.7 g/dL (ref 3.5–5.0)
Alkaline Phosphatase: 147 U/L — ABNORMAL HIGH (ref 38–126)
Anion gap: 10 (ref 5–15)
BUN: 11 mg/dL (ref 6–20)
CO2: 24 mmol/L (ref 22–32)
Calcium: 8 mg/dL — ABNORMAL LOW (ref 8.9–10.3)
Chloride: 107 mmol/L (ref 98–111)
Creatinine: 0.83 mg/dL (ref 0.44–1.00)
GFR, Est AFR Am: >60 mL/min (ref >60)
GFR, Estimated: >60 mL/min (ref >60)
Glucose, Bld: 159 mg/dL — ABNORMAL HIGH (ref 70–99)
Potassium: 3.7 mmol/L (ref 3.5–5.1)
Sodium: 141 mmol/L (ref 135–145)
Total Bilirubin: 0.6 mg/dL (ref 0.3–1.2)
Total Protein: 7.2 g/dL (ref 6.5–8.1)

## 2018-08-27 LAB — TOTAL PROTEIN, URINE DIPSTICK: Protein, ur: NEGATIVE mg/dL

## 2018-08-27 LAB — CEA (IN HOUSE-CHCC): CEA (CHCC-In House): 114.63 ng/mL — ABNORMAL HIGH (ref 0.00–5.00)

## 2018-08-27 LAB — IRON AND TIBC
Iron: 169 ug/dL — ABNORMAL HIGH (ref 41–142)
Saturation Ratios: 39 % (ref 21–57)
TIBC: 433 ug/dL (ref 236–444)
UIBC: 264 ug/dL (ref 120–384)

## 2018-08-27 LAB — MAGNESIUM: Magnesium: 2 mg/dL (ref 1.7–2.4)

## 2018-08-27 LAB — FERRITIN: Ferritin: 23 ng/mL (ref 11–307)

## 2018-08-27 MED ORDER — SODIUM CHLORIDE 0.9 % IV SOLN
7.5000 mg/kg | Freq: Once | INTRAVENOUS | Status: AC
  Administered 2018-08-27: 550 mg via INTRAVENOUS
  Filled 2018-08-27: qty 16

## 2018-08-27 MED ORDER — IBUPROFEN 200 MG PO TABS
ORAL_TABLET | ORAL | Status: AC
  Filled 2018-08-27: qty 1

## 2018-08-27 MED ORDER — SODIUM CHLORIDE 0.9 % IV SOLN
Freq: Once | INTRAVENOUS | Status: AC
  Administered 2018-08-27: 13:00:00 via INTRAVENOUS
  Filled 2018-08-27: qty 5

## 2018-08-27 MED ORDER — OXALIPLATIN CHEMO INJECTION 100 MG/20ML
130.0000 mg/m2 | Freq: Once | INTRAVENOUS | Status: AC
  Administered 2018-08-27: 14:00:00 240 mg via INTRAVENOUS
  Filled 2018-08-27: qty 40

## 2018-08-27 MED ORDER — DEXTROSE 5 % IV SOLN
Freq: Once | INTRAVENOUS | Status: AC
  Administered 2018-08-27: 14:00:00 via INTRAVENOUS
  Filled 2018-08-27: qty 250

## 2018-08-27 MED ORDER — SODIUM CHLORIDE 0.9 % IV SOLN
INTRAVENOUS | Status: DC
  Administered 2018-08-27: 13:00:00 via INTRAVENOUS
  Filled 2018-08-27: qty 250

## 2018-08-27 MED ORDER — PALONOSETRON HCL INJECTION 0.25 MG/5ML
0.2500 mg | Freq: Once | INTRAVENOUS | Status: AC
  Administered 2018-08-27: 0.25 mg via INTRAVENOUS

## 2018-08-27 MED ORDER — IBUPROFEN 200 MG PO TABS
400.0000 mg | ORAL_TABLET | Freq: Once | ORAL | Status: AC
  Administered 2018-08-27: 13:00:00 400 mg via ORAL

## 2018-08-27 MED ORDER — IBUPROFEN 200 MG PO TABS
ORAL_TABLET | ORAL | Status: AC
  Filled 2018-08-27: qty 2

## 2018-08-27 MED ORDER — PALONOSETRON HCL INJECTION 0.25 MG/5ML
INTRAVENOUS | Status: AC
  Filled 2018-08-27: qty 5

## 2018-08-27 NOTE — Patient Instructions (Signed)
[facility] Discharge Instructions for Patients Receiving Chemotherapy  Today you received the following chemotherapy agents Avastin, Oxaliplatin (ELOXATIN).  To help prevent nausea and vomiting after your treatment, we encourage you to take your nausea medication as prescribed.   If you develop nausea and vomiting that is not controlled by your nausea medication, call the clinic.   BELOW ARE SYMPTOMS THAT SHOULD BE REPORTED IMMEDIATELY:  *FEVER GREATER THAN 100.5 F  *CHILLS WITH OR WITHOUT FEVER  NAUSEA AND VOMITING THAT IS NOT CONTROLLED WITH YOUR NAUSEA MEDICATION  *UNUSUAL SHORTNESS OF BREATH  *UNUSUAL BRUISING OR BLEEDING  TENDERNESS IN MOUTH AND THROAT WITH OR WITHOUT PRESENCE OF ULCERS  *URINARY PROBLEMS  *BOWEL PROBLEMS  UNUSUAL RASH Items with * indicate a potential emergency and should be followed up as soon as possible.  Feel free to call the clinic should you have any questions or concerns. The clinic phone number is [phone].  Please show the [facility] at check-in to the Emergency Department and triage nurse.  Coronavirus (COVID-19) Are you at risk?  Are you at risk for the Coronavirus (COVID-19)?  To be considered HIGH RISK for Coronavirus (COVID-19), you have to meet the following criteria:  . Traveled to [place], [place], [place], [place] or [place]; or in the [place] to [place], [place], [place], or [place]; and have fever, cough, and shortness of breath within the last 2 weeks of travel OR . Been in close contact with a person diagnosed with COVID-19 within the last 2 weeks and have fever, cough, and shortness of breath . IF YOU DO NOT MEET THESE CRITERIA, YOU ARE CONSIDERED LOW RISK FOR COVID-19.  What to do if you are HIGH RISK for COVID-19?  Marland Kitchen If you are having a medical emergency, call 911. . Seek medical care right away. Before you go to a doctor's office, urgent care or emergency department, call ahead and  tell them about your recent travel, contact with someone diagnosed with COVID-19, and your symptoms. You should receive instructions from your physician's office regarding next steps of care.  . When you arrive at healthcare provider, tell the healthcare staff immediately you have returned from visiting [place], [place], [place], [place] or [place]; or traveled in the [place] to [place], [place], [place], or [place]; in the last two weeks or you have been in close contact with a person diagnosed with COVID-19 in the last 2 weeks.   . Tell the health care staff about your symptoms: fever, cough and shortness of breath. . After you have been seen by a medical provider, you will be either: o Tested for (COVID-19) and discharged home on quarantine except to seek medical care if symptoms worsen, and asked to  - Stay home and avoid contact with others until you get your results (4-5 days)  - Avoid travel on public transportation if possible (such as bus, train, or airplane) or o Sent to the Emergency Department by EMS for evaluation, COVID-19 testing, and possible admission depending on your condition and test results.  What to do if you are LOW RISK for COVID-19?  Reduce your risk of any infection by using the same precautions used for avoiding the common cold or flu:  Marland Kitchen Wash your hands often with soap and warm water for at least 20 seconds.  If soap and water are not readily available, use an alcohol-based hand sanitizer with at least 60% alcohol.  . If coughing  or sneezing, cover your mouth and nose by coughing or sneezing into the elbow areas of your shirt or coat, into a tissue or into your sleeve (not your hands). . Avoid shaking hands with others and consider head nods or verbal greetings only. . Avoid touching your eyes, nose, or mouth with unwashed hands.  . Avoid close contact with people who are sick. . Avoid places or events with large numbers of people in one location, like  concerts or sporting events. . Carefully consider travel plans you have or are making. . If you are planning any travel outside or inside the [place], visit the CDC's Travelers' Health webpage for the latest health notices. . If you have some symptoms but not all symptoms, continue to monitor at home and seek medical attention if your symptoms worsen. . If you are having a medical emergency, call 911.   [place] / e-Visit: [email or web address]         [facility]: [facility]: [phone]                   [facility]: [phone] \

## 2018-08-30 ENCOUNTER — Telehealth: Payer: Self-pay | Admitting: Hematology

## 2018-08-30 NOTE — Telephone Encounter (Signed)
Scheduled appt per 6/5 los. °

## 2018-09-01 ENCOUNTER — Telehealth: Payer: Self-pay | Admitting: *Deleted

## 2018-09-01 NOTE — Telephone Encounter (Signed)
Received vm call from pt asking for return call.  Called pt back & got vm. Called pt again at end of day & she would like to talk with Cira Rue NP.  Informed that message would be sent to her to call her tomorrow.  She was fine with this.

## 2018-09-02 ENCOUNTER — Telehealth: Payer: Self-pay | Admitting: Nurse Practitioner

## 2018-09-02 NOTE — Telephone Encounter (Signed)
Thanks, Myrtle. I called her back. Dr. Burr Medico, please see my phone note in Epic. She did not tolerate treatment well. She will see you next f/u.  Thanks, Regan Rakers

## 2018-09-02 NOTE — Telephone Encounter (Signed)
I called Ms. Seufert back. She reports extreme weakness, cold sensitivity, and neuropathy with pain in hands, feet, and legs with difficulty walking and cramping in her hands for up to 1 week after chemo (avastin and oxaliplatin) on 08/27/18. She had difficulty functioning. She lost taste and developed nausea and constipation. Compazine was not very effective, she did not try zofran. Constipation resolved with miralax, apple juice, and prunes. Symptoms started day after treatment. She did not call us. She feels better today, eating better and less weak. She declined to come in for IVF. She did not have severe symptoms like this with single agent oxaliplatin on 08/06/18. She doesn't know if she can tolerate this regimen again. She just wanted [place] to be aware; I will notify Dr. Burr Medico. Denied further needs. I reviewed symptom management and encouraged her to call with new/worsening concerns. Reviewed schedule, CT on 6/18 and f/u and treatment on 6/26. She appreciates the call. Cira Rue, NP  09/02/18

## 2018-09-06 ENCOUNTER — Other Ambulatory Visit: Payer: Self-pay

## 2018-09-07 ENCOUNTER — Telehealth: Payer: Self-pay

## 2018-09-07 NOTE — Telephone Encounter (Signed)
  Received Epic notification that pt has not read MyChart message regarding results.    Recent lab results  From  Fonnie Mu, CMA To  [place] and [facility]  08/19/2018 1:12 PM  Dear Ms. Danelle Earthly,   Valetta Fuller has reviewed your recent labs and asked that I share that your total cholesterol is 266 (normal is less than 199),your HDL (good cholesterol) is 67 mg/dL (normal is greater than 40) and your LDL (bad cholesterol) is 165 (normal is less than 99).      Please greatly reduce the saturated fat in your diet and remain as active as possible.  Valetta Fuller recommends re-checking your lipids in 6 months and, if not improved at that time, she can discuss with you starting a statin medication.   Wishing you well,  Kenney Houseman, CMA for  Mina Marble, NP     [address]  MyChart User Last Read On  JALISHA ENNEKING Not Read        Pt informed of results.  Pt expressed understanding and is agreeable.  Charyl Bigger, CMA

## 2018-09-09 ENCOUNTER — Other Ambulatory Visit: Payer: Self-pay | Admitting: Hematology

## 2018-09-09 ENCOUNTER — Other Ambulatory Visit: Payer: Self-pay

## 2018-09-09 ENCOUNTER — Ambulatory Visit (HOSPITAL_COMMUNITY)
Admission: RE | Admit: 2018-09-09 | Discharge: 2018-09-09 | Disposition: A | Discharge status: Home / Self Care | Payer: BC Managed Care – PPO | Source: Ambulatory Visit | Attending: Hematology | Admitting: Hematology

## 2018-09-09 DIAGNOSIS — C19 Malignant neoplasm of rectosigmoid junction: Secondary | ICD-10-CM | POA: Insufficient documentation

## 2018-09-09 MED ORDER — SODIUM CHLORIDE (PF) 0.9 % IJ SOLN
INTRAMUSCULAR | Status: AC
  Filled 2018-09-09: qty 50

## 2018-09-09 MED ORDER — IOHEXOL 300 MG/ML  SOLN
100.0000 mL | Freq: Once | INTRAMUSCULAR | Status: AC | PRN
  Administered 2018-09-09: 100 mL via INTRAVENOUS

## 2018-09-10 ENCOUNTER — Telehealth: Payer: Self-pay

## 2018-09-10 NOTE — Telephone Encounter (Signed)
Spoke with Beverly Schwartz. Advised per Dr. Burr Medico Beverly Schwartz is responding to chemo, and Dr. Burr Medico will review scan in detail with her next week. Advised Beverly Schwartz to come to her appointment with Dr. Burr Medico next week so Dr. Burr Medico can give her a detailed report. Beverly Schwartz states that she did see the report in my chart. Beverly Schwartz would also like to speak to [facility] Dr. Ernestina Penna nurse. Advised Beverly Schwartz that I will give Malachy Mood the message.

## 2018-09-10 NOTE — Telephone Encounter (Signed)
-----   Message from Truitt Merle, MD sent at 09/09/2018 11:27 PM EDT ----- Please let pt know her CT results, she is responding to chemo, I will reviewed the scan in details with her next week, encourage her to keep appointment next week, thanks  Truitt Merle  09/09/2018

## 2018-09-10 NOTE — Progress Notes (Signed)
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 Abdominopelvic adenopathy. Rectosigmoid mass with suggestion of partial obstruction as evidenced by large colonic stool burden more proximally. 2.  Aortic Atherosclerosis (ICD10-I70.0).  This is age advanced.   05/31/2017 Tumor Marker   CEA 353.3 (elevated) AFP 4.5 (normal)   05/31/2017 Initial Biopsy   Diagnosis Colon, biopsy, sigmoid - INVASIVE ADENOCARCINOMA - SEE COMMENT   05/31/2017 Imaging   CT CHEST IMPRESSION: 1. No evidence of metastatic disease in the chest. 2. No acute findings.  Aortic Atherosclerosis (ICD10-I70.0).    05/31/2017 Procedure   Colonoscopy per Dr. Watt Climes Findings: An infiltrative and ulcerated partially obstructing medium-sized mass was found in the recto-sigmoid colon. The mass was circumferential. The mass measured four cm in length. No bleeding was present.   Impression - One small polyp in the rectum. - The examination was otherwise normal. - Malignant partially obstructing tumor in  the recto-sigmoid colon. Biopsied. - One medium polyp in the proximal sigmoid colon. - The examination was otherwise normal. - Internal hemorrhoids.   06/03/2017 Initial Diagnosis   Malignant neoplasm of transverse colon ([place])   06/11/2017 Cancer Staging   Staging form: Colon and Rectum, AJCC 8th Edition - Clinical stage from 06/11/2017: Stage IVA (cTX, cN1b, pM1a) - Signed by Truitt Merle, MD on 06/15/2017   06/11/2017 Pathology Results   Liver biopsy confirmed metastatic colon cancer   07/23/2017 Imaging   CT CAP IMPRESSION: 1. Mild progression of hepatic metastasis. 2. Similar rectosigmoid primary with abdominopelvic nodal metastasis. 3.  No acute process or evidence of metastatic disease in the chest. 4. Aortic Atherosclerosis (ICD10-I70.0). Coronary artery atherosclerosis. This is age advanced. 5. Proximal colonic constipation, again suggesting a component of partial obstruction at the primary site. 6. Mild ascending aortic dilatation at 4.1 cm.   08/25/2017 - 05/10/2018 Chemotherapy   -Xeloda '1500mg'$  BID 1 week on and 1 week off starting 08/25/17. Due to her worsening hand-foot syndrome her dose was reduced to '1500mg'$  in the AM and '1000mg'$  in the PM. Due to disease progression we swithced her to CAPOX  -Vectibix every 2 weeks starting 08/25/17. Due to skin rash will stop starting 05/10/18.     12/14/2017 Imaging   IMPRESSION: 1. Response to therapy. Marked decrease in hepatic metastatic burden. More mild decrease in abdominopelvic adenopathy and definition of rectosigmoid primary. 2.  No acute process or evidence of metastatic disease in the chest. 3.  Aortic Atherosclerosis (ICD10-I70.0).    04/28/2018 Imaging   CT CAP 04/28/18  IMPRESSION: 1. Index liver metastases measured on the previous study show no substantial interval change although new liver lesions on today's exam are concerning for progressive disease. 2.  Mild lymphadenopathy in the upper abdomen is stable. 3. Persistent mild  ill-defined wall thickening in the distal sigmoid colon near the rectosigmoid junction. 4.  Aortic Atherosclerois (ICD10-170.0)    05/17/2018 -  Chemotherapy   CAPOX every 3 weeks with Xeloda '1500mg'$  BID 2 weeks on/1week off starting 05/17/18 -Add Avastin to first cycle.    09/09/2018 Imaging   CT CAP W Contrast IMPRESSION: 1. Slight interval decrease in size one of the right hepatic lobe lesions. Additional lesions within the liver are similar when compared to recent prior exam. 2. Mild adenopathy within the abdomen is stable. 3. Similar-appearing thick walled distal sigmoid colon.      CURRENT THERAPY:  Second line CAPOX and Avastin every 3 weeks with Xeloda '1500mg'$  BID 2 weeks on/1 week off starting 05/17/18. Dose reduced oxaliplatin starting with cycle 5 due to neuropathy    INTERVAL HISTORY:  Beverly Schwartz is here for a follow up. She presents to the clinic alone. She notes her last cycle did not go well. She had more neuropathy and nausea after the infusion. The nausea lasted 2-3 days. She still has tingling currently but was dropping item the first few days. She notes her lower legs felt numb and effected her walking. She notes her main symptoms last the first week.     REVIEW OF SYSTEMS:   Constitutional: Denies fevers, chills or abnormal weight loss Eyes: Denies blurriness of vision Ears, nose, mouth, throat, and face: Denies mucositis or sore throat Respiratory: Denies cough, dyspnea or wheezes Cardiovascular: Denies palpitation, chest discomfort or lower extremity swelling Gastrointestinal:  Denies nausea, heartburn or change in bowel habits Skin: Denies abnormal skin rashes Lymphatics: Denies new lymphadenopathy or easy bruising Neurological: (+) neuropathy of fingers and feet, mainly tingling  Behavioral/Psych: Mood is stable, no new changes  All other systems were reviewed with the patient and are negative.  MEDICAL HISTORY:  Past Medical History:  Diagnosis  Date  . Anxiety   . Cancer ([place])    colon, liver  . Chest pain   . Colon cancer ([place])   . Complication of anesthesia   . Depression   . Diabetes mellitus   . Dizziness   . Family history of breast cancer   . Family history of stomach cancer   . Hyperlipidemia   . Hypertension   . Hypothyroidism   . Obesity   . PONV (postoperative nausea and vomiting)   . Tachycardia     SURGICAL HISTORY: Past Surgical History:  Procedure Laterality Date  . FLEXIBLE SIGMOIDOSCOPY N/A 05/31/2017   Procedure: FLEXIBLE SIGMOIDOSCOPY;  Surgeon: Clarene Essex, MD;  Location: [facility];  Service: Endoscopy;  Laterality: N/A;  . WISDOM TOOTH EXTRACTION     age 74's    I have reviewed the social history and family history with the patient and they are unchanged from previous note.  ALLERGIES:  is allergic to bupropion and amoxicillin.  MEDICATIONS:  Current Outpatient Medications  Medication Sig Dispense Refill  . acetaminophen-codeine (TYLENOL #3) 300-30 MG tablet Take 1 tablet by mouth every 6 (six) hours as needed for moderate pain. 30 tablet 0  . albuterol (PROVENTIL HFA;VENTOLIN HFA) 108 (90 Base) MCG/ACT inhaler Inhale 2 puffs into the lungs every 6 (six) hours as needed for wheezing or shortness of breath. 1 Inhaler 0  . ALPRAZolam (XANAX) 1 MG tablet TAKE 1 TABLET BY MOUTH AT BEDTIME AS NEEDED FOR ANXIETY 30 tablet 0  . amLODipine (NORVASC) 5 MG tablet Take 1 tablet (  5 mg total) by mouth daily. 90 tablet 0  . benzonatate (TESSALON) 100 MG capsule Take 1 capsule (100 mg total) by mouth 3 (three) times daily as needed for cough. 20 capsule 0  . capecitabine (XELODA) 500 MG tablet TAKE 3 TABLETS (1,500 MG TOTAL) BY MOUTH 2 (TWO) TIMES DAILY AFTER A MEAL. TAKE FOR 14 DAYS ON, 7 DAYS OFF, REPEAT EVERY 21 DAYS. 84 tablet 1  . cetirizine (ZYRTEC ALLERGY) 10 MG tablet Take 1 tablet (10 mg total) by mouth daily. 30 tablet 1  . CINNAMON PO Take 1 tablet by mouth daily.     . fluticasone (FLONASE) 50  MCG/ACT nasal spray Place 1 spray into both nostrils daily. (Patient taking differently: Place 1 spray into both nostrils daily as needed for allergies. ) 16 g 2  . insulin NPH-regular Human (70-30) 100 UNIT/ML injection 17 Units with breakfast, 10 Units with dinner 10 mL 2  . levothyroxine (SYNTHROID, LEVOTHROID) 112 MCG tablet Take 1 tablet (112 mcg total) by mouth daily before breakfast. 90 tablet 3  . lisinopril (ZESTRIL) 5 MG tablet Take 1 tablet (5 mg total) by mouth daily. 90 tablet 0  . mirtazapine (REMERON) 7.5 MG tablet TAKE 1 TABLET (7.5 MG TOTAL) BY MOUTH AT BEDTIME. 90 tablet 1  . Omega-3 Fatty Acids (FISH OIL) 1000 MG CAPS Take 1,000 mg by mouth daily.     . polyethylene glycol (MIRALAX / GLYCOLAX) packet Take 17 g by mouth daily. 14 each 0  . prochlorperazine (COMPAZINE) 10 MG tablet Take 1 tablet (10 mg total) by mouth every 6 (six) hours as needed (NAUSEA). 30 tablet 1  . TURMERIC PO Take 1 capsule by mouth daily.      No current facility-administered medications for this visit.     PHYSICAL EXAMINATION: ECOG PERFORMANCE STATUS: 1 - Symptomatic but completely ambulatory  Vitals:   09/17/18 1316  BP: (!) 159/100  Pulse: 89  Resp: 17  Temp: 98.5 F (36.9 C)  SpO2: 100%   Filed Weights   09/17/18 1316  Weight: 163 lb 9.6 oz (74.2 kg)    GENERAL:alert, no distress and comfortable SKIN: skin color, texture, turgor are normal, no rashes or significant lesions EYES: normal, Conjunctiva are pink and non-injected, sclera clear  NECK: supple, thyroid normal size, non-tender, without nodularity LYMPH:  no palpable lymphadenopathy in the cervical, axillary  LUNGS: clear to auscultation and percussion with normal breathing effort HEART: regular rate & rhythm and no murmurs and no lower extremity edema ABDOMEN:abdomen soft, non-tender and normal bowel sounds Musculoskeletal:no cyanosis of digits and no clubbing  NEURO: alert & oriented x 3 with fluent speech, (+) Mild  sensory deficit of right side  LABORATORY DATA:  I have reviewed the data as listed CBC Latest Ref Rng & Units 09/17/2018 08/27/2018 08/06/2018  WBC 4.0 - 10.5 K/uL 6.5 6.3 6.9  Hemoglobin 12.0 - 15.0 g/dL 13.2 13.1 14.4  Hematocrit 36.0 - 46.0 % 39.8 40.3 44.2  Platelets 150 - 400 K/uL 203 209 211     CMP Latest Ref Rng & Units 09/17/2018 08/27/2018 08/06/2018  Glucose 70 - 99 mg/dL 171(H) 159(H) 189(H)  BUN 6 - 20 mg/dL '9 11 10  '$ Creatinine 0.44 - 1.00 mg/dL 0.88 0.83 0.85  Sodium 135 - 145 mmol/L 142 141 142  Potassium 3.5 - 5.1 mmol/L 3.7 3.7 3.8  Chloride 98 - 111 mmol/L 106 107 106  CO2 22 - 32 mmol/L '26 24 26  '$ Calcium 8.9 - 10.3 mg/dL  8.3(L) 8.0(L) 9.2  Total Protein 6.5 - 8.1 g/dL 7.0 7.2 7.6  Total Bilirubin 0.3 - 1.2 mg/dL 0.4 0.6 0.5  Alkaline Phos 38 - 126 U/L 134(H) 147(H) 145(H)  AST 15 - 41 U/L '17 19 15  '$ ALT 0 - 44 U/L '14 16 16      '$ RADIOGRAPHIC STUDIES: I have personally reviewed the radiological images as listed and agreed with the findings in the report. No results found.   ASSESSMENT & PLAN:  Beverly Schwartz is a 48 y.o. female with   1. Sigmoid adenocarcinoma, with abdominopelvic adenopathy andhepatic metastasis, stage IV, MSI-stable , KRAS/NRAS/BRAF wild type -Diagnosed in 05/2017. Treated withfirst-line chemotherapyXelodaandVectibixand tolerated well. Patient declined intensive chemo regiments previously due to fear of side effects.Unfortunatelyshe had mild disease progressionafter 8 months of therapy. -She is currently on second lineCAPOX and Avastin, she does not want a port orthe infusion pump at this point.  -She started CAPOXand Avastinon 05/17/18. Shetolerated thefirst cycle moderately well with fatigue, nausea, and muscle cramps in her handsfor 3 days, recovered well. -She has been very reluctant to come to clinic given COVID-19concernsand has not been compliant with treatmentor follow ups lately. She has been more compliant lately.   -S/p 4 cycles she developed more neuropathy lately, mainly tingling and primarily the first week of cycle.  -We discussed her CT CAP from 09/09/18 which shows slight decrease in size of right liver lesion, otherwise stable liver lesions. Her mild adenopathy in abdomen is stable. She is overall responding to chemo. I reviewed the scan images with her in person.  -She has at least 3 known liver lesions which has decreased significantly since chemo, largest 2.7cm now. It is possible but difficult do do target liver therapy and colon surgery at this point, we discussed options of microwave ablation and SBRT to her liver lesion. Will reconsider after next scan.  -I discussed balancing the control of her disease and her quality of life with side effects. I will reduce her oxaliplatin dose to '85mg'$ /m2 to see if she can better tolerate for now. Will likely have to d/c in the near future. She is agreeable.  -Labs reviewed, CBC and CMP WNL except BG 171, Ca 8.3, Alk Phos 134. Urine protein negative, iron panel and CEA still pending. Overall adequate to proceed with oxaliplatin on 6/29.  -F/u in 3 weeks   2.Hypokalemia, hypo-magnesium anemia, hypocalcemia  -Probably secondary chemotherapy, especiallyVectibix -Continueoral K, Mag and Calcium. -Hadresolved since she stopped vectibix   3. DM, HTN, HL -f/u with PCP -Onpropranololfor HTN currently. But not well controlled  -Started her on '5mg'$  amlodipine on 07/23/18. Increased her to '10mg'$  on 08/06/18.  -Her PCP recently started her on lisinopril '5mg'$  on 08/18/18.  -BP improved to at159/100today (09/17/18).I again advised her to increased her amlodipine to '10mg'$ . If still high after 2 weeks, she can increase lisinopril to '10mg'$ .    4. Financial and Social issues, Anxiety, depression -Shefollows up as needed withSocial Worker Hollice Espy -She is currently on Xanax  -Shehas regained insurancewithBlue cross insurancethis year  5.Goal of care  discussion  -She understands the goal of care is palliative, her cancer is incurable. The goal of therapy is to prolong her life and improve her quality of life. -She is full code for now   6. Hand-foot syndrome, secondary to Xeloda  -She developed mild hand-foot syndrome, secondary to Xeloda.This has much improved with a dose reduction of Xeloda. -She will continue to use Urea cream -Has much improved,  stable   7.Insomnia, anxiety  -She has had trouble sleeping from anxiety and allergies  -She has Xanax which she uses as needed. -I previously prescribed her Mirtazapine for depression and insomnia in 07/2018. She has not taken it yet. She will think about it. She is also considering Cymbalta with her PCP.  -She will f/u with [place] as needed.    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 This is most significant and effecting her activity level during first week of cycle and it improves afterwards, she only has mild tingling now  -Mild sensory deficit of right side on exam today (09/17/18) -will reduce oxaliplatin dose starting with cycle 5   Plan -CT scan reviewed, she is responding well to treatment.  -Labs reviewed, will proceed with Xeloda and oxaliplatin with dose reduction to '85mg'$ /m2, due to poor tolerance and neuropathy -Lab and f/u on 7/22     No problem-specific Assessment & Plan notes found for this encounter.   No orders of the defined types were placed in this encounter.  All questions were answered. The patient knows to call the clinic with any problems, questions or concerns. No barriers to learning was detected. I spent 20 minutes counseling the patient face to face. The total time spent in the appointment was 25 minutes and more than 50% was on counseling and review of test results     Truitt Merle, MD 09/17/2018   I, Joslyn Devon, am acting as scribe for Truitt Merle, MD.   I have  reviewed the above documentation for accuracy and completeness, and I agree with the above.

## 2018-09-13 ENCOUNTER — Telehealth: Payer: Self-pay | Admitting: *Deleted

## 2018-09-13 NOTE — Telephone Encounter (Signed)
Pt called requesting to change appts.  Spoke with pt, and was informed that pt would like to reschedule all appts from Friday 09/17/18 to either Monday  6/29 or Tues  6/30. Dr. Burr Medico notified.  Ok to change date as per MD.  Schedule message sent. Pt's   Phone     [phone].

## 2018-09-14 ENCOUNTER — Encounter: Payer: Self-pay | Admitting: Hematology

## 2018-09-14 ENCOUNTER — Other Ambulatory Visit: Payer: Self-pay | Admitting: Hematology

## 2018-09-14 MED FILL — CAPECITABINE 500 MG TABS: 500 | 21 days supply | Qty: 84 | Fill #0

## 2018-09-14 NOTE — Telephone Encounter (Signed)
"  Beverly Schwartz [phone]).  Need appointments moved to Monday 09-20-2018.  I had reaction with last treatment.  Office is not open on weekends to call, talk with the doctor or be seen.  I was receiving treatments on Monday and somehow changed to Friday.  Reaction was nausea, no vomiting, Could not grab anything and couldn't walk with really bad neuropathy.  Hands are not swelling but redness in the palms.  I need treatment during the week,no later than Wednesday to be able to call in or be seen if needed."  Expressed above needs understanding after hours instructions to call [place].  Routing request to provider for review.

## 2018-09-15 ENCOUNTER — Encounter: Payer: Self-pay | Admitting: Hematology

## 2018-09-17 ENCOUNTER — Other Ambulatory Visit: Payer: Self-pay

## 2018-09-17 ENCOUNTER — Encounter: Payer: Self-pay | Admitting: Hematology

## 2018-09-17 ENCOUNTER — Inpatient Hospital Stay: Payer: BC Managed Care – PPO

## 2018-09-17 ENCOUNTER — Telehealth: Payer: Self-pay | Admitting: Hematology

## 2018-09-17 ENCOUNTER — Inpatient Hospital Stay (HOSPITAL_BASED_OUTPATIENT_CLINIC_OR_DEPARTMENT_OTHER): Payer: BC Managed Care – PPO | Admitting: Hematology

## 2018-09-17 VITALS — BP 159/100 | HR 89 | Temp 98.5°F | Resp 17 | Ht 65.0 in | Wt 163.6 lb

## 2018-09-17 DIAGNOSIS — G620 Drug-induced polyneuropathy: Secondary | ICD-10-CM | POA: Diagnosis not present

## 2018-09-17 DIAGNOSIS — C187 Malignant neoplasm of sigmoid colon: Secondary | ICD-10-CM

## 2018-09-17 DIAGNOSIS — C19 Malignant neoplasm of rectosigmoid junction: Secondary | ICD-10-CM

## 2018-09-17 DIAGNOSIS — D649 Anemia, unspecified: Secondary | ICD-10-CM

## 2018-09-17 DIAGNOSIS — L271 Localized skin eruption due to drugs and medicaments taken internally: Secondary | ICD-10-CM

## 2018-09-17 DIAGNOSIS — E876 Hypokalemia: Secondary | ICD-10-CM

## 2018-09-17 DIAGNOSIS — C787 Secondary malignant neoplasm of liver and intrahepatic bile duct: Secondary | ICD-10-CM | POA: Diagnosis not present

## 2018-09-17 DIAGNOSIS — F418 Other specified anxiety disorders: Secondary | ICD-10-CM

## 2018-09-17 DIAGNOSIS — E119 Type 2 diabetes mellitus without complications: Secondary | ICD-10-CM

## 2018-09-17 DIAGNOSIS — I10 Essential (primary) hypertension: Secondary | ICD-10-CM

## 2018-09-17 DIAGNOSIS — C778 Secondary and unspecified malignant neoplasm of lymph nodes of multiple regions: Secondary | ICD-10-CM

## 2018-09-17 DIAGNOSIS — D500 Iron deficiency anemia secondary to blood loss (chronic): Secondary | ICD-10-CM

## 2018-09-17 LAB — CEA (IN HOUSE-CHCC): CEA (CHCC-In House): 144.86 ng/mL — ABNORMAL HIGH (ref 0.00–5.00)

## 2018-09-17 LAB — CMP (CANCER CENTER ONLY)
ALT: 14 U/L (ref 0–44)
AST: 17 U/L (ref 15–41)
Albumin: 3.6 g/dL (ref 3.5–5.0)
Alkaline Phosphatase: 134 U/L — ABNORMAL HIGH (ref 38–126)
Anion gap: 10 (ref 5–15)
BUN: 9 mg/dL (ref 6–20)
CO2: 26 mmol/L (ref 22–32)
Calcium: 8.3 mg/dL — ABNORMAL LOW (ref 8.9–10.3)
Chloride: 106 mmol/L (ref 98–111)
Creatinine: 0.88 mg/dL (ref 0.44–1.00)
GFR, Est AFR Am: >60 mL/min (ref >60)
GFR, Estimated: >60 mL/min (ref >60)
Glucose, Bld: 171 mg/dL — ABNORMAL HIGH (ref 70–99)
Potassium: 3.7 mmol/L (ref 3.5–5.1)
Sodium: 142 mmol/L (ref 135–145)
Total Bilirubin: 0.4 mg/dL (ref 0.3–1.2)
Total Protein: 7 g/dL (ref 6.5–8.1)

## 2018-09-17 LAB — CBC WITH DIFFERENTIAL (CANCER CENTER ONLY)
Abs Immature Granulocytes: 0.01 10*3/uL (ref 0.00–0.07)
Basophils Absolute: 0 10*3/uL (ref 0.0–0.1)
Basophils Relative: 1 %
Eosinophils Absolute: 0.1 10*3/uL (ref 0.0–0.5)
Eosinophils Relative: 2 %
HCT: 39.8 % (ref 36.0–46.0)
Hemoglobin: 13.2 g/dL (ref 12.0–15.0)
Immature Granulocytes: 0 %
Lymphocytes Relative: 32 %
Lymphs Abs: 2.1 10*3/uL (ref 0.7–4.0)
MCH: 31.8 pg (ref 26.0–34.0)
MCHC: 33.2 g/dL (ref 30.0–36.0)
MCV: 95.9 fL (ref 80.0–100.0)
Monocytes Absolute: 0.5 10*3/uL (ref 0.1–1.0)
Monocytes Relative: 7 %
Neutro Abs: 3.8 10*3/uL (ref 1.7–7.7)
Neutrophils Relative %: 58 %
Platelet Count: 203 10*3/uL (ref 150–400)
RBC: 4.15 MIL/uL (ref 3.87–5.11)
RDW: 18.6 % — ABNORMAL HIGH (ref 11.5–15.5)
WBC Count: 6.5 10*3/uL (ref 4.0–10.5)
nRBC: 0 % (ref 0.0–0.2)

## 2018-09-17 LAB — IRON AND TIBC
Iron: 38 ug/dL — ABNORMAL LOW (ref 41–142)
Saturation Ratios: 9 % — ABNORMAL LOW (ref 21–57)
TIBC: 406 ug/dL (ref 236–444)
UIBC: 368 ug/dL (ref 120–384)

## 2018-09-17 LAB — FERRITIN: Ferritin: 30 ng/mL (ref 11–307)

## 2018-09-17 LAB — TOTAL PROTEIN, URINE DIPSTICK: Protein, ur: NEGATIVE mg/dL

## 2018-09-17 NOTE — Telephone Encounter (Signed)
Scheduled appt per 6/26 los. Spoke with patient and she is aware of her appt date and time. Might move appts because patient wanted her appt scheduled for 7/20, I informed the MD, to see if it will be okay to get the treatment on 7/20.

## 2018-09-17 NOTE — Telephone Encounter (Deleted)
Scheduled appt per 6/26 los. Patient will get a print out while in treatment,

## 2018-09-20 ENCOUNTER — Other Ambulatory Visit: Payer: Self-pay

## 2018-09-20 ENCOUNTER — Inpatient Hospital Stay (HOSPITAL_BASED_OUTPATIENT_CLINIC_OR_DEPARTMENT_OTHER): Payer: BC Managed Care – PPO | Admitting: Medical

## 2018-09-20 ENCOUNTER — Other Ambulatory Visit: Payer: Self-pay | Admitting: Medical

## 2018-09-20 ENCOUNTER — Inpatient Hospital Stay: Payer: BC Managed Care – PPO

## 2018-09-20 VITALS — BP 147/89 | HR 99 | Temp 97.8°F | Resp 20

## 2018-09-20 DIAGNOSIS — C19 Malignant neoplasm of rectosigmoid junction: Secondary | ICD-10-CM

## 2018-09-20 DIAGNOSIS — Z7189 Other specified counseling: Secondary | ICD-10-CM

## 2018-09-20 DIAGNOSIS — C187 Malignant neoplasm of sigmoid colon: Secondary | ICD-10-CM | POA: Diagnosis not present

## 2018-09-20 MED ORDER — OXALIPLATIN CHEMO INJECTION 100 MG/20ML: 85.0000 mg/m2 | Freq: Once | INTRAVENOUS | Status: DC

## 2018-09-20 MED ORDER — SODIUM CHLORIDE 0.9 % IV SOLN
Freq: Once | INTRAVENOUS | Status: AC
  Administered 2018-09-20: 13:00:00 via INTRAVENOUS
  Filled 2018-09-20: qty 5

## 2018-09-20 MED ORDER — SODIUM CHLORIDE 0.9 % IV SOLN
7.5000 mg/kg | Freq: Once | INTRAVENOUS | Status: AC
  Administered 2018-09-20: 550 mg via INTRAVENOUS
  Filled 2018-09-20: qty 8

## 2018-09-20 MED ORDER — DEXTROSE 5 % IV SOLN
Freq: Once | INTRAVENOUS | Status: AC
  Administered 2018-09-20: 14:00:00 via INTRAVENOUS
  Filled 2018-09-20: qty 250

## 2018-09-20 MED ORDER — DEXAMETHASONE 4 MG PO TABS: 4.0000 mg | ORAL_TABLET | Freq: Two times a day (BID) | ORAL | 0 refills | Status: DC

## 2018-09-20 MED ORDER — OXALIPLATIN CHEMO INJECTION 100 MG/20ML
82.0000 mg/m2 | Freq: Once | INTRAVENOUS | Status: AC
  Administered 2018-09-20: 150 mg via INTRAVENOUS
  Filled 2018-09-20: qty 20

## 2018-09-20 MED ORDER — HEPARIN SOD (PORK) LOCK FLUSH 100 UNIT/ML IV SOLN
500.0000 [IU] | Freq: Once | INTRAVENOUS | Status: DC | PRN
  Filled 2018-09-20: qty 5

## 2018-09-20 MED ORDER — ALPRAZOLAM 0.5 MG PO TABS
ORAL_TABLET | ORAL | Status: AC
  Filled 2018-09-20: qty 2

## 2018-09-20 MED ORDER — PALONOSETRON HCL INJECTION 0.25 MG/5ML
0.2500 mg | Freq: Once | INTRAVENOUS | Status: AC
  Administered 2018-09-20: 0.25 mg via INTRAVENOUS

## 2018-09-20 MED ORDER — SODIUM CHLORIDE 0.9% FLUSH
10.0000 mL | INTRAVENOUS | Status: DC | PRN
  Filled 2018-09-20: qty 10

## 2018-09-20 MED ORDER — SODIUM CHLORIDE 0.9 % IV SOLN
INTRAVENOUS | Status: DC
  Administered 2018-09-20: 13:00:00 via INTRAVENOUS
  Filled 2018-09-20: qty 250

## 2018-09-20 MED ORDER — PALONOSETRON HCL INJECTION 0.25 MG/5ML
INTRAVENOUS | Status: AC
  Filled 2018-09-20: qty 5

## 2018-09-20 MED ORDER — ALPRAZOLAM 0.5 MG PO TABS
1.0000 mg | ORAL_TABLET | Freq: Once | ORAL | Status: AC
  Administered 2018-09-20: 1 mg via ORAL

## 2018-09-20 NOTE — Progress Notes (Signed)
During oxaliplatin infusion, patient began c/o pain in right arm proximal and distal to IV site. Infusion immediately paused. Noted brisk blood return from IV site.D5W initiated @ 200 mL/hr. Warm compresses and warm towel applied to site. No erythema/edema noted at insertion site. Patient continued to c/o severe pain in spite of efforts. Dr. Burr Medico came to infusion room to evaluate patient. Dr. Burr Medico said to hold remainder of today's treatment and will contact IR to schedule PAC placement at a later date. When discontinuing IV, noted area proximal to IV insertion site extending to antecubital space to be firm to touch with cord-like feeling. Patient verbalized increased discomfort with palpation. Minimal erythema/edema now noted. Sandi Mealy, PA-C came to evaluate. Patient encouraged to use warm compresses and to call clinic if additional symptoms arise. Patient verbalized understanding.

## 2018-09-20 NOTE — Patient Instructions (Signed)
[facility] Discharge Instructions for Patients Receiving Chemotherapy  Today you received the following chemotherapy agents: Avastin, Oxaliplatin   To help prevent nausea and vomiting after your treatment, we encourage you to take your nausea medication as directed.    If you develop nausea and vomiting that is not controlled by your nausea medication, call the clinic.   BELOW ARE SYMPTOMS THAT SHOULD BE REPORTED IMMEDIATELY:  *FEVER GREATER THAN 100.5 F  *CHILLS WITH OR WITHOUT FEVER  NAUSEA AND VOMITING THAT IS NOT CONTROLLED WITH YOUR NAUSEA MEDICATION  *UNUSUAL SHORTNESS OF BREATH  *UNUSUAL BRUISING OR BLEEDING  TENDERNESS IN MOUTH AND THROAT WITH OR WITHOUT PRESENCE OF ULCERS  *URINARY PROBLEMS  *BOWEL PROBLEMS  UNUSUAL RASH Items with * indicate a potential emergency and should be followed up as soon as possible.  Feel free to call the clinic should you have any questions or concerns. The clinic phone number is [phone].  Please show the [facility] at check-in to the Emergency Department and triage nurse.

## 2018-09-20 NOTE — Progress Notes (Signed)
Patient reports that she feels "anxious", and asked if she could receive a one time dose of xanax during treatment, as she has an order for it, but forgot to bring it with her. Dr. Burr Medico notified and gave order for Xanax 1mg  po once.

## 2018-09-21 ENCOUNTER — Telehealth: Payer: Self-pay

## 2018-09-21 ENCOUNTER — Telehealth: Payer: Self-pay | Admitting: Medical

## 2018-09-21 ENCOUNTER — Ambulatory Visit (HOSPITAL_COMMUNITY): Payer: BC Managed Care – PPO

## 2018-09-21 NOTE — Telephone Encounter (Signed)
No los per 6/29. °

## 2018-09-21 NOTE — Progress Notes (Signed)
Patient was seen in the infusion room today she was receiving chemotherapy.  She is completed her Avastin and was receiving oxaliplatin.  She does not have a port and is receiving this peripherally.  She reported having pain in her right anterior forearm proximal to the IV insertion site.  She had good blood return and had no trouble receiving IV fluids.  She continued to have tenderness above the site after oxaliplatin was paused and ultimately stopped.  There was an area of mild erythema and induration along the vein.  There was no evidence of an extravasation.  The patient was reassured and was told to keep heat to the area.  No steroids or anti-inflammatories were recommended.  Sandi Mealy, MHS, PA-C Physician Assistant

## 2018-09-22 NOTE — Telephone Encounter (Signed)
Called patient to check the status of IV site from previous day infusion. Patient states the pain has improved significantly. States the area surrounding the site continues to have slight erythema, but does not seem to have worsened since she was in the infusion room. Encouraged patient to call clinic or come to [facility] if symptoms worsen or if new symptoms arise. Patient verbalizes understanding.

## 2018-09-24 ENCOUNTER — Encounter: Payer: Self-pay | Admitting: General Practice

## 2018-09-24 NOTE — Progress Notes (Signed)
[facility] Spiritual Care Note  Continuing to try to reach pt by phone, but VM full. Will mail handwritten note of encouragement.  [place], [place], [facility] Pager [phone] Voicemail [phone]

## 2018-09-29 ENCOUNTER — Encounter: Payer: Self-pay | Admitting: Hematology

## 2018-09-30 ENCOUNTER — Other Ambulatory Visit: Payer: Self-pay

## 2018-09-30 ENCOUNTER — Other Ambulatory Visit: Payer: Self-pay | Admitting: Hematology

## 2018-09-30 DIAGNOSIS — C19 Malignant neoplasm of rectosigmoid junction: Secondary | ICD-10-CM

## 2018-10-01 ENCOUNTER — Telehealth: Payer: Self-pay

## 2018-10-01 ENCOUNTER — Telehealth: Payer: Self-pay | Admitting: Adult Health

## 2018-10-01 DIAGNOSIS — Z20822 Contact with and (suspected) exposure to covid-19: Secondary | ICD-10-CM

## 2018-10-01 NOTE — Telephone Encounter (Signed)
Contacted Beverly Schwartz and she states Dr. Burr Medico wanted pt to be tested due to pt c/o cough and fatigue for a few days. No known fever as pt is not able to take her temp.   Contacted pt and she has been scheduled for Monday 10/04/18 for 10:15. Pt is aware to remain in her car and wear a mask. Pt had no additional questions at this time. Nothing further is needed

## 2018-10-01 NOTE — Telephone Encounter (Signed)
Copied from [place] [id number]. Topic: General - Other >> Oct 01, 2018 10:12 AM Keene Breath wrote: Reason for CRM: [place] (Dr. Ernestina Penna office) called to schedule a COVID test for the patient.  Please call Malachy Mood or leave a number for her to call back if she is not in, to schedule appt.  CB# [phone]

## 2018-10-01 NOTE — Telephone Encounter (Signed)
Spoke with nurse about getting this patient COVID tested at [facility], she will reach out to the patient to schedule.

## 2018-10-04 ENCOUNTER — Other Ambulatory Visit: Payer: Self-pay

## 2018-10-04 DIAGNOSIS — Z20822 Contact with and (suspected) exposure to covid-19: Secondary | ICD-10-CM

## 2018-10-08 LAB — NOVEL CORONAVIRUS, NAA: SARS-CoV-2, NAA: NOT DETECTED

## 2018-10-11 ENCOUNTER — Encounter: Payer: Self-pay | Admitting: Hematology

## 2018-10-11 ENCOUNTER — Encounter: Payer: Self-pay | Admitting: Adult Health

## 2018-10-11 ENCOUNTER — Other Ambulatory Visit: Payer: Self-pay

## 2018-10-11 DIAGNOSIS — E119 Type 2 diabetes mellitus without complications: Secondary | ICD-10-CM

## 2018-10-11 MED ORDER — "INSULIN SYRINGE-NEEDLE U-100 31G X 5/16"" 1 ML MISC": 3 refills | Status: AC

## 2018-10-12 ENCOUNTER — Telehealth: Payer: Self-pay | Admitting: Hematology

## 2018-10-12 NOTE — Telephone Encounter (Signed)
R/s appt per 7/20 sch message - unable to reach pt left message with appt date and time

## 2018-10-13 ENCOUNTER — Other Ambulatory Visit: Payer: BC Managed Care – PPO

## 2018-10-13 ENCOUNTER — Ambulatory Visit: Payer: BC Managed Care – PPO

## 2018-10-13 ENCOUNTER — Ambulatory Visit: Payer: BC Managed Care – PPO | Admitting: Hematology

## 2018-10-14 ENCOUNTER — Inpatient Hospital Stay: Payer: BC Managed Care – PPO

## 2018-10-14 ENCOUNTER — Inpatient Hospital Stay: Payer: BC Managed Care – PPO | Admitting: Hematology

## 2018-10-18 ENCOUNTER — Other Ambulatory Visit: Payer: Self-pay | Admitting: Radiology

## 2018-10-19 ENCOUNTER — Ambulatory Visit (HOSPITAL_COMMUNITY)
Admission: RE | Admit: 2018-10-19 | Discharge: 2018-10-19 | Disposition: A | Discharge status: Home / Self Care | Payer: BC Managed Care – PPO | Source: Ambulatory Visit | Attending: Hematology | Admitting: Hematology

## 2018-10-19 ENCOUNTER — Telehealth: Payer: Self-pay

## 2018-10-19 ENCOUNTER — Encounter (HOSPITAL_COMMUNITY): Payer: Self-pay

## 2018-10-19 ENCOUNTER — Other Ambulatory Visit: Payer: Self-pay

## 2018-10-19 ENCOUNTER — Telehealth: Payer: Self-pay | Admitting: Hematology

## 2018-10-19 DIAGNOSIS — Z880 Allergy status to penicillin: Secondary | ICD-10-CM | POA: Insufficient documentation

## 2018-10-19 DIAGNOSIS — Z79899 Other long term (current) drug therapy: Secondary | ICD-10-CM | POA: Insufficient documentation

## 2018-10-19 DIAGNOSIS — E119 Type 2 diabetes mellitus without complications: Secondary | ICD-10-CM | POA: Diagnosis not present

## 2018-10-19 DIAGNOSIS — C189 Malignant neoplasm of colon, unspecified: Secondary | ICD-10-CM | POA: Insufficient documentation

## 2018-10-19 DIAGNOSIS — F329 Major depressive disorder, single episode, unspecified: Secondary | ICD-10-CM | POA: Diagnosis not present

## 2018-10-19 DIAGNOSIS — Z8249 Family history of ischemic heart disease and other diseases of the circulatory system: Secondary | ICD-10-CM | POA: Diagnosis not present

## 2018-10-19 DIAGNOSIS — C19 Malignant neoplasm of rectosigmoid junction: Secondary | ICD-10-CM

## 2018-10-19 DIAGNOSIS — I10 Essential (primary) hypertension: Secondary | ICD-10-CM | POA: Diagnosis not present

## 2018-10-19 DIAGNOSIS — Z794 Long term (current) use of insulin: Secondary | ICD-10-CM | POA: Insufficient documentation

## 2018-10-19 DIAGNOSIS — E039 Hypothyroidism, unspecified: Secondary | ICD-10-CM | POA: Insufficient documentation

## 2018-10-19 DIAGNOSIS — F419 Anxiety disorder, unspecified: Secondary | ICD-10-CM | POA: Diagnosis not present

## 2018-10-19 HISTORY — PX: IR IMAGING GUIDED PORT INSERTION: IMG5740

## 2018-10-19 LAB — PROTIME-INR
INR: 0.8 (ref 0.8–1.2)
Prothrombin Time: 11.5 seconds (ref 11.4–15.2)

## 2018-10-19 LAB — CBC WITH DIFFERENTIAL/PLATELET
Abs Immature Granulocytes: 0.02 10*3/uL (ref 0.00–0.07)
Basophils Absolute: 0 10*3/uL (ref 0.0–0.1)
Basophils Relative: 1 %
Eosinophils Absolute: 0.3 10*3/uL (ref 0.0–0.5)
Eosinophils Relative: 4 %
HCT: 43.1 % (ref 36.0–46.0)
Hemoglobin: 14 g/dL (ref 12.0–15.0)
Immature Granulocytes: 0 %
Lymphocytes Relative: 34 %
Lymphs Abs: 2.1 10*3/uL (ref 0.7–4.0)
MCH: 31 pg (ref 26.0–34.0)
MCHC: 32.5 g/dL (ref 30.0–36.0)
MCV: 95.6 fL (ref 80.0–100.0)
Monocytes Absolute: 0.5 10*3/uL (ref 0.1–1.0)
Monocytes Relative: 9 %
Neutro Abs: 3.2 10*3/uL (ref 1.7–7.7)
Neutrophils Relative %: 52 %
Platelets: 174 10*3/uL (ref 150–400)
RBC: 4.51 MIL/uL (ref 3.87–5.11)
RDW: 16.5 % — ABNORMAL HIGH (ref 11.5–15.5)
WBC: 6.2 10*3/uL (ref 4.0–10.5)
nRBC: 0 % (ref 0.0–0.2)

## 2018-10-19 MED ORDER — CLINDAMYCIN PHOSPHATE 900 MG/50ML IV SOLN
INTRAVENOUS | Status: AC
  Administered 2018-10-19: 12:00:00 900 mg via INTRAVENOUS
  Filled 2018-10-19: qty 50

## 2018-10-19 MED ORDER — MIDAZOLAM HCL 2 MG/2ML IJ SOLN
INTRAMUSCULAR | Status: AC | PRN
  Administered 2018-10-19 (×3): 1 mg via INTRAVENOUS

## 2018-10-19 MED ORDER — CLINDAMYCIN PHOSPHATE 900 MG/50ML IV SOLN
900.0000 mg | Freq: Once | INTRAVENOUS | Status: AC
  Administered 2018-10-19: 12:00:00 900 mg via INTRAVENOUS

## 2018-10-19 MED ORDER — FENTANYL CITRATE (PF) 100 MCG/2ML IJ SOLN
INTRAMUSCULAR | Status: AC
  Filled 2018-10-19: qty 4

## 2018-10-19 MED ORDER — FENTANYL CITRATE (PF) 100 MCG/2ML IJ SOLN
INTRAMUSCULAR | Status: AC | PRN
  Administered 2018-10-19 (×2): 50 ug via INTRAVENOUS

## 2018-10-19 MED ORDER — ONDANSETRON HCL 4 MG/2ML IJ SOLN
INTRAMUSCULAR | Status: AC | PRN
  Administered 2018-10-19: 4 mg via INTRAVENOUS

## 2018-10-19 MED ORDER — LIDOCAINE-EPINEPHRINE (PF) 1 %-1:200000 IJ SOLN
INTRAMUSCULAR | Status: AC | PRN
  Administered 2018-10-19 (×2): 10 mL

## 2018-10-19 MED ORDER — ONDANSETRON HCL 4 MG/2ML IJ SOLN: 4.0000 mg | Freq: Once | INTRAMUSCULAR | Status: AC

## 2018-10-19 MED ORDER — HEPARIN SOD (PORK) LOCK FLUSH 100 UNIT/ML IV SOLN
INTRAVENOUS | Status: AC
  Filled 2018-10-19: qty 5

## 2018-10-19 MED ORDER — MIDAZOLAM HCL 2 MG/2ML IJ SOLN
INTRAMUSCULAR | Status: AC
  Filled 2018-10-19: qty 4

## 2018-10-19 MED ORDER — LIDOCAINE-EPINEPHRINE (PF) 2 %-1:200000 IJ SOLN
INTRAMUSCULAR | Status: AC
  Filled 2018-10-19: qty 20

## 2018-10-19 MED ORDER — ONDANSETRON HCL 4 MG/2ML IJ SOLN
INTRAMUSCULAR | Status: AC
  Filled 2018-10-19: qty 2

## 2018-10-19 MED ORDER — SODIUM CHLORIDE 0.9 % IV SOLN
INTRAVENOUS | Status: DC
  Administered 2018-10-19: 10:00:00 via INTRAVENOUS

## 2018-10-19 MED ORDER — HEPARIN SOD (PORK) LOCK FLUSH 100 UNIT/ML IV SOLN
INTRAVENOUS | Status: AC | PRN
  Administered 2018-10-19: 500 [IU] via INTRAVENOUS

## 2018-10-19 NOTE — Telephone Encounter (Signed)
Pt called stating that she is having a port put in this morning at 9:30am and wants to know if she can take half of a 1mg  alprazolam.  Per Mina Marble, NP advised pt that as long as someone is driving her, she may take 1/2 tablet of the alprazolam.  Pt expressed understanding and is agreeable.  Charyl Bigger, CMA

## 2018-10-19 NOTE — Discharge Instructions (Addendum)
Moderate Conscious Sedation, Adult, Care After These instructions provide you with information about caring for yourself after your procedure. Your health care provider may also give you more specific instructions. Your treatment has been planned according to current medical practices, but problems sometimes occur. Call your health care provider if you have any problems or questions after your procedure. What can I expect after the procedure? After your procedure, it is common:  To feel sleepy for several hours.  To feel clumsy and have poor balance for several hours.  To have poor judgment for several hours.  To vomit if you eat too soon. Follow these instructions at home: For at least 24 hours after the procedure:   Do not: ? Participate in activities where you could fall or become injured. ? Drive. ? Use heavy machinery. ? Drink alcohol. ? Take sleeping pills or medicines that cause drowsiness. ? Make important decisions or sign legal documents. ? Take care of children on your own.  Rest. Eating and drinking  Follow the diet recommended by your health care provider.  If you vomit: ? Drink water, juice, or soup when you can drink without vomiting. ? Make sure you have little or no nausea before eating solid foods. General instructions  Have a responsible adult stay with you until you are awake and alert.  Take over-the-counter and prescription medicines only as told by your health care provider.  If you smoke, do not smoke without supervision.  Keep all follow-up visits as told by your health care provider. This is important. Contact a health care provider if:  You keep feeling nauseous or you keep vomiting.  You feel light-headed.  You develop a rash.  You have a fever. Get help right away if:  You have trouble breathing. This information is not intended to replace advice given to you by your health care provider. Make sure you discuss any questions you have  with your health care provider. Document Released: 12/29/2012 Document Revised: 02/20/2017 Document Reviewed: 06/30/2015 Elsevier Patient Education  [place] Insertion, Care After This sheet gives you information about how to care for yourself after your procedure. Your health care provider may also give you more specific instructions. If you have problems or questions, contact your health care provider. What can I expect after the procedure? After the procedure, it is common to have:  Discomfort at the port insertion site.  Bruising on the skin over the port. This should improve over 3-4 days. Follow these instructions at home: [facility] care  After your port is placed, you will get a manufacturer's information card. The card has information about your port. Keep this card with you at all times.  Take care of the port as told by your health care provider. Ask your health care provider if you or a family member can get training for taking care of the port at home. A home health care nurse may also take care of the port.  Make sure to remember what type of port you have. Incision care      Follow instructions from your health care provider about how to take care of your port insertion site. Make sure you: ? Wash your hands with soap and water before and after you change your bandage (dressing). If soap and water are not available, use hand sanitizer. ? Change your dressing as told by your health care provider. ? Leave stitches (sutures), skin glue, or adhesive strips in place. These skin closures may  need to stay in place for 2 weeks or longer. If adhesive strip edges start to loosen and curl up, you may trim the loose edges. Do not remove adhesive strips completely unless your health care provider tells you to do that.  Check your port insertion site every day for signs of infection. Check for: ? Redness, swelling, or pain. ? Fluid or blood. ? Warmth. ? Pus or a  bad smell. Activity  Return to your normal activities as told by your health care provider. Ask your health care provider what activities are safe for you.  Do not lift anything that is heavier than 10 lb (4.5 kg), or the limit that you are told, until your health care provider says that it is safe. General instructions  Take over-the-counter and prescription medicines only as told by your health care provider.  Do not take baths, swim, or use a hot tub until your health care provider approves. Ask your health care provider if you may take showers. You may only be allowed to take sponge baths.  Do not drive for 24 hours if you were given a sedative during your procedure.  Wear a medical alert bracelet in case of an emergency. This will tell any health care providers that you have a port.  Keep all follow-up visits as told by your health care provider. This is important. Contact a health care provider if:  You cannot flush your port with saline as directed, or you cannot draw blood from the port.  You have a fever or chills.  You have redness, swelling, or pain around your port insertion site.  You have fluid or blood coming from your port insertion site.  Your port insertion site feels warm to the touch.  You have pus or a bad smell coming from the port insertion site. Get help right away if:  You have chest pain or shortness of breath.  You have bleeding from your port that you cannot control. Summary  Take care of the port as told by your health care provider. Keep the manufacturer's information card with you at all times.  Change your dressing as told by your health care provider.  Contact a health care provider if you have a fever or chills or if you have redness, swelling, or pain around your port insertion site.  Keep all follow-up visits as told by your health care provider. This information is not intended to replace advice given to you by your health care  provider. Make sure you discuss any questions you have with your health care provider. Document Released: 12/29/2012 Document Revised: 10/06/2017 Document Reviewed: 10/06/2017 Elsevier Patient Education  [place].

## 2018-10-19 NOTE — H&P (Signed)
Referring Physician(s): Feng,Yan  Supervising Physician: Daryll Brod    Patient Status:  [facility] OP  Chief Complaint:  "I'm here for a port a cath"  Subjective: Patient familiar to IR service from liver lesion biopsy on 06/11/2017.  She has a history of metastatic colon cancer and presents again today for Port-A-Cath placement for palliative chemotherapy.  She denies fever, worsening dyspnea, cough, nausea, vomiting or bleeding.  She does have occasional headaches, as well as intermittent chest, abdominal and back discomfort.  She is extremely anxious.  Past Medical History:  Diagnosis Date  . Anxiety   . Cancer ([place])    colon, liver  . Chest pain 10/18/2018   Patient c/o chest pain right side yesterday intermitten lasting 3-4 hours.  [name], PA notified  . Colon cancer ([place])   . Complication of anesthesia   . Depression   . Diabetes mellitus   . Dizziness   . Family history of breast cancer   . Family history of stomach cancer   . Hyperlipidemia   . Hypertension   . Hypothyroidism   . Obesity   . PONV (postoperative nausea and vomiting)   . Tachycardia    Past Surgical History:  Procedure Laterality Date  . FLEXIBLE SIGMOIDOSCOPY N/A 05/31/2017   Procedure: FLEXIBLE SIGMOIDOSCOPY;  Surgeon: Clarene Essex, MD;  Location: [facility] ENDOSCOPY;  Service: Endoscopy;  Laterality: N/A;  . WISDOM TOOTH EXTRACTION     age 55's      Allergies: Bupropion and Amoxicillin  Medications: Prior to Admission medications   Medication Sig Start Date End Date Taking? Authorizing Provider  acetaminophen-codeine (TYLENOL #3) 300-30 MG tablet Take 1 tablet by mouth every 6 (six) hours as needed for moderate pain. 12/02/17  Yes Alla Feeling, NP  ALPRAZolam (XANAX) 1 MG tablet TAKE 1 TABLET BY MOUTH AT BEDTIME AS NEEDED FOR ANXIETY 08/18/18  Yes Danford, Valetta Fuller D, NP  amLODipine (NORVASC) 5 MG tablet Take 1 tablet (5 mg total) by mouth daily. 08/18/18  Yes Danford, Valetta Fuller D, NP  capecitabine  (XELODA) 500 MG tablet TAKE 3 TABLETS (1,500 MG TOTAL) BY MOUTH 2 (TWO) TIMES DAILY AFTER A MEAL. TAKE FOR 14 DAYS ON, 7 DAYS OFF, REPEAT EVERY 21 DAYS. 09/09/18  Yes Truitt Merle, MD  CINNAMON PO Take 1 tablet by mouth daily.    Yes [provider]  insulin NPH-regular Human (70-30) 100 UNIT/ML injection 17 Units with breakfast, 10 Units with dinner 08/18/18  Yes Danford, Valetta Fuller D, NP  Insulin Syringe-Needle U-100 (BD INSULIN SYRINGE U/F) 31G X 5/16" 1 ML MISC Inject twice a day as directed 10/11/18  Yes Danford, Valetta Fuller D, NP  levothyroxine (SYNTHROID, LEVOTHROID) 112 MCG tablet Take 1 tablet (112 mcg total) by mouth daily before breakfast. 05/06/18  Yes Danford, Katy D, NP  lisinopril (ZESTRIL) 5 MG tablet Take 1 tablet (5 mg total) by mouth daily. 08/18/18  Yes Danford, Valetta Fuller D, NP  polyethylene glycol (MIRALAX / GLYCOLAX) packet Take 17 g by mouth daily. 06/01/17  Yes Florencia Reasons, MD  albuterol (PROVENTIL HFA;VENTOLIN HFA) 108 (90 Base) MCG/ACT inhaler Inhale 2 puffs into the lungs every 6 (six) hours as needed for wheezing or shortness of breath. 03/30/18   Danford, Valetta Fuller D, NP  benzonatate (TESSALON) 100 MG capsule Take 1 capsule (100 mg total) by mouth 3 (three) times daily as needed for cough. 03/30/18   Danford, Valetta Fuller D, NP  cetirizine (ZYRTEC ALLERGY) 10 MG tablet Take 1 tablet (10 mg total) by  mouth daily. 01/06/18   Alla Feeling, NP  clindamycin (CLINDAGEL) 1 % gel APPLY TO AFFECTED AREA TWICE A DAY 09/20/18   Truitt Merle, MD  fluticasone [facility]) 50 MCG/ACT nasal spray Place 1 spray into both nostrils daily. Patient taking differently: Place 1 spray into both nostrils daily as needed for allergies.  12/10/16   Danford, Valetta Fuller D, NP  mirtazapine (REMERON) 7.5 MG tablet TAKE 1 TABLET (7.5 MG TOTAL) BY MOUTH AT BEDTIME. 08/09/18   Truitt Merle, MD  Omega-3 Fatty Acids (FISH OIL) 1000 MG CAPS Take 1,000 mg by mouth daily.     [provider]  prochlorperazine (COMPAZINE) 10 MG tablet Take 1 tablet (10  mg total) by mouth every 6 (six) hours as needed (NAUSEA). 05/17/18   Truitt Merle, MD  TURMERIC PO Take 1 capsule by mouth daily.     [provider]     Vital Signs: BP (!) 170/105   Pulse (!) 102   Temp 98.6 F (37 C) (Oral)   Resp 18   SpO2 100%   Physical Exam awake, alert.  Chest with distant breath sounds bilaterally.  Heart with tachycardic but regular rhythm.  Abdomen soft, positive bowel sounds, mild diffuse tenderness to palpation.  Extremities with full range of motion.  Imaging: No results found.  Labs:  CBC: Recent Labs    08/06/18 1053 08/27/18 1054 09/17/18 1257 10/19/18 1020  WBC 6.9 6.3 6.5 6.2  HGB 14.4 13.1 13.2 14.0  HCT 44.2 40.3 39.8 43.1  PLT 211 209 203 174    COAGS: Recent Labs    10/19/18 1020  INR 0.8    BMP: Recent Labs    07/25/18 1923 08/06/18 1053 08/27/18 1054 09/17/18 1257  NA 134* 142 141 142  K 3.4* 3.8 3.7 3.7  CL 99 106 107 106  CO2 22 26 24 26   GLUCOSE 235* 189* 159* 171*  BUN 9 10 11 9   CALCIUM 7.6* 9.2 8.0* 8.3*  CREATININE 0.77 0.85 0.83 0.88  GFRNONAA >60 >60 >60 >60  GFRAA >60 >60 >60 >60    LIVER FUNCTION TESTS: Recent Labs    07/23/18 1106 08/06/18 1053 08/27/18 1054 09/17/18 1257  BILITOT 0.4 0.5 0.6 0.4  AST 17 15 19 17   ALT 13 16 16 14   ALKPHOS 133* 145* 147* 134*  PROT 7.5 7.6 7.2 7.0  ALBUMIN 3.7 3.6 3.7 3.6    Assessment and Plan: Pt with history of stage IV metastatic colon cancer ; presents today for Port-A-Cath placement for palliative chemotherapy.  Risks and benefits of image guided port-a-catheter placement was discussed with the patient including, but not limited to bleeding, infection, pneumothorax, or fibrin sheath development and need for additional procedures.  All of the patient's questions were answered, patient is agreeable to proceed. Consent signed and in chart.     Electronically Signed: D. [name], PA-C 10/19/2018, 11:34 AM   I spent a total of 25  minutes at the the patient's bedside AND on the patient's hospital floor or unit, greater than 50% of which was counseling/coordinating care for Port-A-Cath placement

## 2018-10-19 NOTE — Telephone Encounter (Signed)
R/s appt on 7/30 per pt request. Pt aware of new date and time

## 2018-10-19 NOTE — Procedures (Signed)
Colon ca  S/p RT IJ PORT  Tip svcra No comp Stable ebl min Full report in pacs Ready for use

## 2018-10-20 ENCOUNTER — Other Ambulatory Visit: Payer: Self-pay

## 2018-10-20 ENCOUNTER — Encounter: Payer: Self-pay | Admitting: Hematology

## 2018-10-20 DIAGNOSIS — Z95828 Presence of other vascular implants and grafts: Secondary | ICD-10-CM

## 2018-10-20 LAB — GLUCOSE, CAPILLARY: Glucose-Capillary: 111 mg/dL — ABNORMAL HIGH (ref 70–99)

## 2018-10-20 MED ORDER — LIDOCAINE-PRILOCAINE 2.5-2.5 % EX CREA: 1.0000 "application " | TOPICAL_CREAM | CUTANEOUS | 0 refills | Status: DC | PRN

## 2018-10-20 NOTE — Progress Notes (Signed)
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 Abdominopelvic adenopathy. Rectosigmoid mass with suggestion of partial obstruction as evidenced by large colonic stool burden more proximally. 2.  Aortic Atherosclerosis (ICD10-I70.0).  This is age advanced.   05/31/2017 Tumor Marker   CEA 353.3 (elevated) AFP 4.5 (normal)   05/31/2017 Initial Biopsy   Diagnosis Colon, biopsy, sigmoid - INVASIVE ADENOCARCINOMA - SEE COMMENT   05/31/2017 Imaging   CT CHEST IMPRESSION: 1. No evidence of metastatic disease in the chest. 2. No acute findings.  Aortic Atherosclerosis (ICD10-I70.0).    05/31/2017 Procedure   Colonoscopy per Dr. Watt Climes Findings: An infiltrative and ulcerated partially obstructing medium-sized mass was found in the recto-sigmoid colon. The mass was circumferential. The mass measured four cm in length. No bleeding was present.   Impression - One small polyp in the rectum. - The examination was otherwise normal. - Malignant partially obstructing tumor in  the recto-sigmoid colon. Biopsied. - One medium polyp in the proximal sigmoid colon. - The examination was otherwise normal. - Internal hemorrhoids.   06/03/2017 Initial Diagnosis   Malignant neoplasm of transverse colon ([place])   06/11/2017 Cancer Staging   Staging form: Colon and Rectum, AJCC 8th Edition - Clinical stage from 06/11/2017: Stage IVA (cTX, cN1b, pM1a) - Signed by Truitt Merle, MD on 06/15/2017   06/11/2017 Pathology Results   Liver biopsy confirmed metastatic colon cancer   07/23/2017 Imaging   CT CAP IMPRESSION: 1. Mild progression of hepatic metastasis. 2. Similar rectosigmoid primary with abdominopelvic nodal metastasis. 3.  No acute process or evidence of metastatic disease in the chest. 4. Aortic Atherosclerosis (ICD10-I70.0). Coronary artery atherosclerosis. This is age advanced. 5. Proximal colonic constipation, again suggesting a component of partial obstruction at the primary site. 6. Mild ascending aortic dilatation at 4.1 cm.   08/25/2017 - 05/10/2018 Chemotherapy   -Xeloda '1500mg'$  BID 1 week on and 1 week off starting 08/25/17. Due to her worsening hand-foot syndrome her dose was reduced to '1500mg'$  in the AM and '1000mg'$  in the PM. Due to disease progression we swithced her to CAPOX  -Vectibix every 2 weeks starting 08/25/17. Due to skin rash will stop starting 05/10/18.     12/14/2017 Imaging   IMPRESSION: 1. Response to therapy. Marked decrease in hepatic metastatic burden. More mild decrease in abdominopelvic adenopathy and definition of rectosigmoid primary. 2.  No acute process or evidence of metastatic disease in the chest. 3.  Aortic Atherosclerosis (ICD10-I70.0).    04/28/2018 Imaging   CT CAP 04/28/18  IMPRESSION: 1. Index liver metastases measured on the previous study show no substantial interval change although new liver lesions on today's exam are concerning for progressive disease. 2.  Mild lymphadenopathy in the upper abdomen is stable. 3. Persistent mild  ill-defined wall thickening in the distal sigmoid colon near the rectosigmoid junction. 4.  Aortic Atherosclerois (ICD10-170.0)    05/17/2018 -  Chemotherapy   CAPOX every 3 weeks with Xeloda '1500mg'$  BID 2 weeks on/1week off starting 05/17/18 -Add Avastin to first cycle.    09/09/2018 Imaging   CT CAP W Contrast IMPRESSION: 1. Slight interval decrease in size one of the right hepatic lobe lesions. Additional lesions within the liver are similar when compared to recent prior exam. 2. Mild adenopathy within the abdomen is stable. 3. Similar-appearing thick walled distal sigmoid colon.      CURRENT THERAPY:  Second line CAPOX and Avastin every 3 weeks with Xeloda '1500mg'$  BID 2 weeks on/1 week off starting 05/17/18. Dose reduced oxaliplatin due to neuropathy/cold sensitivity with cycle 6 and again with 7.   INTERVAL HISTORY:  Beverly Schwartz is here for a follow up and treatment. She presents to the clinic alone. She notes her port was placed 2 days ago.  She notes she does not like oxaliplatin. It takes her 6 days of cold sensitivity before she starts feeling better. She has been on Xeloda for 2 weeks now. Her last cycle she had stomach pain from constipation. She has been taking Miralax and Senna.    REVIEW OF SYSTEMS:   Constitutional: Denies fevers, chills or abnormal weight loss Eyes: Denies blurriness of vision Ears, nose, mouth, throat, and face: Denies mucositis or sore throat Respiratory: Denies cough, dyspnea or wheezes Cardiovascular: Denies palpitation, chest discomfort or lower extremity swelling Gastrointestinal:  Denies nausea, heartburn (+) Constipation with abdominal pain Skin: Denies abnormal skin rashes Lymphatics: Denies new lymphadenopathy or easy bruising Neurological:Denies numbness, tingling or new weaknesses Behavioral/Psych: Mood is stable, no new changes  All other systems were reviewed with the patient and are negative.  MEDICAL HISTORY:  Past Medical  History:  Diagnosis Date   Anxiety    Cancer ([place])    colon, liver   Chest pain 10/18/2018   Patient c/o chest pain right side yesterday intermitten lasting 3-4 hours.  [name], PA notified   Colon cancer [facility])    Complication of anesthesia    Depression    Diabetes mellitus    Dizziness    Family history of breast cancer    Family history of stomach cancer    Hyperlipidemia    Hypertension    Hypothyroidism    Obesity    PONV (postoperative nausea and vomiting)    Tachycardia     SURGICAL HISTORY: Past Surgical History:  Procedure Laterality Date   FLEXIBLE SIGMOIDOSCOPY N/A 05/31/2017   Procedure: FLEXIBLE SIGMOIDOSCOPY;  Surgeon: Clarene Essex, MD;  Location: [facility];  Service: Endoscopy;  Laterality: N/A;   IR IMAGING GUIDED PORT INSERTION  10/19/2018   WISDOM TOOTH EXTRACTION     age 77's    I have reviewed the social history and family history with the patient and they are unchanged from previous note.  ALLERGIES:  is allergic to bupropion and amoxicillin.  MEDICATIONS:  Current Outpatient Medications  Medication Sig Dispense Refill   acetaminophen-codeine (TYLENOL #3) 300-30 MG tablet Take 1 tablet by mouth every 6 (six) hours as needed for moderate pain. 30 tablet 0   albuterol (PROVENTIL HFA;VENTOLIN HFA) 108 (90 Base) MCG/ACT inhaler Inhale 2 puffs into the lungs every 6 (six) hours as needed for wheezing or shortness of breath. 1 Inhaler 0   ALPRAZolam (XANAX) 1  MG tablet TAKE 1 TABLET BY MOUTH AT BEDTIME AS NEEDED FOR ANXIETY 30 tablet 0   amLODipine (NORVASC) 5 MG tablet Take 1 tablet (5 mg total) by mouth daily. 90 tablet 0   benzonatate (TESSALON) 100 MG capsule Take 1 capsule (100 mg total) by mouth 3 (three) times daily as needed for cough. 20 capsule 0   capecitabine (XELODA) 500 MG tablet TAKE 3 TABLETS (1,500 MG TOTAL) BY MOUTH 2 (TWO) TIMES DAILY AFTER A MEAL. TAKE FOR 14 DAYS ON, 7 DAYS OFF, REPEAT EVERY 21 DAYS. 84  tablet 1   cetirizine (ZYRTEC ALLERGY) 10 MG tablet Take 1 tablet (10 mg total) by mouth daily. 30 tablet 1   CINNAMON PO Take 1 tablet by mouth daily.      clindamycin (CLINDAGEL) 1 % gel APPLY TO AFFECTED AREA TWICE A DAY 60 g 1   fluticasone (FLONASE) 50 MCG/ACT nasal spray Place 1 spray into both nostrils daily. (Patient taking differently: Place 1 spray into both nostrils daily as needed for allergies. ) 16 g 2   insulin NPH-regular Human (70-30) 100 UNIT/ML injection 17 Units with breakfast, 10 Units with dinner 10 mL 2   Insulin Syringe-Needle U-100 (BD INSULIN SYRINGE U/F) 31G X 5/16" 1 ML MISC Inject twice a day as directed 200 each 3   levothyroxine (SYNTHROID, LEVOTHROID) 112 MCG tablet Take 1 tablet (112 mcg total) by mouth daily before breakfast. 90 tablet 3   lidocaine-prilocaine (EMLA) cream Apply 1 application topically as needed. 30 g 0   lisinopril (ZESTRIL) 5 MG tablet Take 1 tablet (5 mg total) by mouth daily. 90 tablet 0   mirtazapine (REMERON) 7.5 MG tablet TAKE 1 TABLET (7.5 MG TOTAL) BY MOUTH AT BEDTIME. 90 tablet 1   Omega-3 Fatty Acids (FISH OIL) 1000 MG CAPS Take 1,000 mg by mouth daily.      polyethylene glycol (MIRALAX / GLYCOLAX) packet Take 17 g by mouth daily. 14 each 0   prochlorperazine (COMPAZINE) 10 MG tablet Take 1 tablet (10 mg total) by mouth every 6 (six) hours as needed (NAUSEA). 30 tablet 1   TURMERIC PO Take 1 capsule by mouth daily.      No current facility-administered medications for this visit.     PHYSICAL EXAMINATION: ECOG PERFORMANCE STATUS: 1 - Symptomatic but completely ambulatory  Vitals with BMI 10/19/2018  Height   Weight   BMI   Systolic 366  Diastolic 87  Pulse 98  Respirations 20    GENERAL:alert, no distress and comfortable SKIN: skin color, texture, turgor are normal, no rashes or significant lesions (+) PAC placed of right chest, healing well (+) Mild skin peeling of soles of feet  EYES: normal, Conjunctiva  are pink and non-injected, sclera clear  NECK: supple, thyroid normal size, non-tender, without nodularity LYMPH:  no palpable lymphadenopathy in the cervical, axillary  LUNGS: clear to auscultation and percussion with normal breathing effort HEART: regular rate & rhythm and no murmurs and no lower extremity edema ABDOMEN:abdomen soft, non-tender and normal bowel sounds Musculoskeletal:no cyanosis of digits and no clubbing  NEURO: alert & oriented x 3 with fluent speech, no focal motor/sensory deficits  LABORATORY DATA:  I have reviewed the data as listed CBC Latest Ref Rng & Units 10/21/2018 10/19/2018 09/17/2018  WBC 4.0 - 10.5 K/uL 6.9 6.2 6.5  Hemoglobin 12.0 - 15.0 g/dL 14.1 14.0 13.2  Hematocrit 36.0 - 46.0 % 44.0 43.1 39.8  Platelets 150 - 400 K/uL 185 174 203  CMP Latest Ref Rng & Units 10/21/2018 09/17/2018 08/27/2018  Glucose 70 - 99 mg/dL 194(H) 171(H) 159(H)  BUN 6 - 20 mg/dL '9 9 11  '$ Creatinine 0.44 - 1.00 mg/dL 0.85 0.88 0.83  Sodium 135 - 145 mmol/L 139 142 141  Potassium 3.5 - 5.1 mmol/L 4.0 3.7 3.7  Chloride 98 - 111 mmol/L 104 106 107  CO2 22 - 32 mmol/L '26 26 24  '$ Calcium 8.9 - 10.3 mg/dL 8.4(L) 8.3(L) 8.0(L)  Total Protein 6.5 - 8.1 g/dL 7.6 7.0 7.2  Total Bilirubin 0.3 - 1.2 mg/dL 0.4 0.4 0.6  Alkaline Phos 38 - 126 U/L 150(H) 134(H) 147(H)  AST 15 - 41 U/L '21 17 19  '$ ALT 0 - 44 U/L '16 14 16      '$ RADIOGRAPHIC STUDIES: I have personally reviewed the radiological images as listed and agreed with the findings in the report. Ir Imaging Guided Port Insertion  Result Date: 10/19/2018 CLINICAL DATA:  Metastatic colon cancer EXAM: RIGHT INTERNAL JUGULAR SINGLE LUMEN POWER PORT CATHETER INSERTION Date:  10/19/2018 10/19/2018 12:33 pm Radiologist:  Jerilynn Mages. Daryll Brod, MD Guidance:  Ultrasound fluoroscopic MEDICATIONS: Clindamycin 900 mg; The antibiotic was administered within an appropriate time interval prior to skin puncture. ANESTHESIA/SEDATION: Versed 3.0 mg IV; Fentanyl  100 mcg IV; 4 mg Zofran Moderate Sedation Time:  27 minutes The patient was continuously monitored during the procedure by the interventional radiology nurse under my direct supervision. FLUOROSCOPY TIME:  0 minutes, 54 seconds (9 mGy) COMPLICATIONS: None immediate. CONTRAST:  None. PROCEDURE: Informed consent was obtained from the patient following explanation of the procedure, risks, benefits and alternatives. The patient understands, agrees and consents for the procedure. All questions were addressed. A time out was performed. Maximal barrier sterile technique utilized including caps, mask, sterile gowns, sterile gloves, large sterile drape, hand hygiene, and 2% chlorhexidine scrub. Under sterile conditions and local anesthesia, right internal jugular micropuncture venous access was performed. Access was performed with ultrasound. Images were obtained for documentation of the patent right internal jugular vein. A guide wire was inserted followed by a transitional dilator. This allowed insertion of a guide wire and catheter into the IVC. Measurements were obtained from the SVC / RA junction back to the right IJ venotomy site. In the right infraclavicular chest, a subcutaneous pocket was created over the second anterior rib. This was done under sterile conditions and local anesthesia. 1% lidocaine with epinephrine was utilized for this. A 2.5 cm incision was made in the skin. Blunt dissection was performed to create a subcutaneous pocket over the right pectoralis major muscle. The pocket was flushed with saline vigorously. There was adequate hemostasis. The port catheter was assembled and checked for leakage. The port catheter was secured in the pocket with two retention sutures. The tubing was tunneled subcutaneously to the right venotomy site and inserted into the SVC/RA junction through a valved peel-away sheath. Position was confirmed with fluoroscopy. Images were obtained for documentation. The patient  tolerated the procedure well. No immediate complications. Incisions were closed in a two layer fashion with 4 - 0 Vicryl suture. Dermabond was applied to the skin. The port catheter was accessed, blood was aspirated followed by saline and heparin flushes. Needle was removed. A dry sterile dressing was applied. IMPRESSION: Ultrasound and fluoroscopically guided right internal jugular single lumen power port catheter insertion. Tip in the SVC/RA junction. Catheter ready for use. Electronically Signed   By: Jerilynn Mages.  Shick M.D.   On: 10/19/2018 13:38  ASSESSMENT & PLAN:  Beverly Schwartz is a 48 y.o. female with   1. Sigmoid adenocarcinoma, with abdominopelvic adenopathy andhepatic metastasis, stage IV, MSI-stable , KRAS/NRAS/BRAF wild type -Diagnosed in 05/2017. Treated withfirst-line chemotherapyXelodaandVectibixand tolerated well. Patient declined intensive chemo regiments previously due to fear of side effects.Unfortunatelyshe had mild disease progressionafter 8 months of therapy. -She is currently on second lineCAPOX and Avastin, she does not want a port orthe infusion pump at this point.  -She started CAPOXand Avastinon 05/17/18. Shetolerated thefirst cycle moderately well with fatigue, nausea, and muscle cramps in her handsfor 3 days, recovered well. -She has been very reluctant to come to clinic given COVID-19concernsand has not been compliant with treatmentor follow ups lately.She has been more compliant lately.  -S/p 4 cycles she developed more neuropathy lately, mainly tingling and primarily the first week of cycle. I have reduced her oxaliplatin to '85mg'$ /m2 every 3 weeks starting with C6.  -Based on CT CAP from 09/09/18 she has at least 3 known liver lesions which has decreased significantly since chemo, largest 2.7cm now. It is possible but difficult do do target liver therapy and colon surgery at this point, we discussed options of microwave ablation and SBRT to her liver  lesion. Will reconsider after next scan.  -She had PAC placed 2 days ago on 10/19/18, clean and ready to access. She does not like taking oxaliplatin, but given she is tolerating moderately well and it is still controlling her disease, I recommend she continue q3 weeks. She requests for dose reduction, I agreed to reduce to 75 mg/m2. She will restart Xeloda '1500mg'$  2 weeks on/1 week off today after holding for 2 weeks.  -Labs reviewed and adequate to proceed with Oxaliplatin today at reduced dose. No avastin today.  -Plan to repeat san in 11/2018 -F/u in 3 weeks    2.Hypokalemia, hypo-magnesium anemia, hypocalcemia  -Probably secondary chemotherapy, especiallyVectibix -Continueoral K, Mag and Calcium. -Hadresolved since she stopped vectibix   3. DM, HTN, HL -f/u with PCP -Onpropranololfor HTN currently. But not well controlled  -Started her on '5mg'$ amlodipineon 07/23/18. Increased her to '10mg'$  on 08/06/18.  -Her PCP recently started her on lisinopril '5mg'$  on 08/18/18. -I advised her to increased her amlodipine to '10mg'$ . If still high after 2 weeks, she can increase lisinopril to '10mg'$ .  -BP at 144/87 today (10/21/18)   4. Financial and Social issues, Anxiety, depression -Shefollows up as needed withSocial Worker Hollice Espy -She is currently on Xanax  -Shehas regained insurancewithBlue cross insurance in 2020  5.Goal of care discussion  -She understands the goal of care is palliative, her cancer is incurable. The goal of therapy is to prolong her life and improve her quality of life. -She is full code for now   6. Hand-foot syndrome, secondary to Xeloda  -She developed mild hand-foot syndrome, secondary to Xeloda.This has much improved with a dose reduction of Xeloda. -She will continue to use Urea cream -Has much improved, stable.  -Has moderate skin peeling of soles of feet on exam today (10/21/18)  7.Insomnia, anxiety  -She has had trouble sleeping from anxiety and  allergies  -She has Xanax which she uses as needed. -Ipreviouslyprescribed her Mirtazapine for depression and insomnia in 07/2018. She has not taken it yet. She will think about it. She is also considering Cymbalta with her PCP. -She will f/u with [place] as needed.  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 This is most significant and  effecting her activity level during first week of cycle and it improves afterwards, she only has mild tingling now  -I reduced oxaliplatin dose starting with cycle 6 and again with cycle 7    Plan -I prescribed her EMLA cream today  -Labs reviewed and adequate to proceed with oxaliplatin with dose reduction to '75mg'$ /m2 -Restart Xeloda '1500mg'$  2 weeks on/1 week on today  -Lab, flush, f/u and oxaliplatin in 3 weeks    No problem-specific Assessment & Plan notes found for this encounter.   No orders of the defined types were placed in this encounter.  All questions were answered. The patient knows to call the clinic with any problems, questions or concerns. No barriers to learning was detected. I spent 20 minutes counseling the patient face to face. The total time spent in the appointment was 25 minutes and more than 50% was on counseling and review of test results     Truitt Merle, MD 10/21/2018   I, Joslyn Devon, am acting as scribe for Truitt Merle, MD.   I have reviewed the above documentation for accuracy and completeness, and I agree with the above.

## 2018-10-21 ENCOUNTER — Inpatient Hospital Stay (HOSPITAL_BASED_OUTPATIENT_CLINIC_OR_DEPARTMENT_OTHER): Payer: BC Managed Care – PPO | Admitting: Hematology

## 2018-10-21 ENCOUNTER — Other Ambulatory Visit: Payer: BC Managed Care – PPO

## 2018-10-21 ENCOUNTER — Telehealth: Payer: Self-pay | Admitting: Hematology

## 2018-10-21 ENCOUNTER — Other Ambulatory Visit: Payer: Self-pay

## 2018-10-21 ENCOUNTER — Ambulatory Visit: Payer: BC Managed Care – PPO | Admitting: Nurse Practitioner

## 2018-10-21 ENCOUNTER — Inpatient Hospital Stay: Payer: BC Managed Care – PPO

## 2018-10-21 ENCOUNTER — Inpatient Hospital Stay: Payer: BC Managed Care – PPO | Attending: Hematology

## 2018-10-21 ENCOUNTER — Ambulatory Visit: Payer: BC Managed Care – PPO

## 2018-10-21 VITALS — BP 145/89 | HR 96 | Temp 98.7°F | Resp 16 | Ht 65.0 in | Wt 161.5 lb

## 2018-10-21 DIAGNOSIS — I10 Essential (primary) hypertension: Secondary | ICD-10-CM

## 2018-10-21 DIAGNOSIS — E876 Hypokalemia: Secondary | ICD-10-CM | POA: Insufficient documentation

## 2018-10-21 DIAGNOSIS — E039 Hypothyroidism, unspecified: Secondary | ICD-10-CM

## 2018-10-21 DIAGNOSIS — C19 Malignant neoplasm of rectosigmoid junction: Secondary | ICD-10-CM

## 2018-10-21 DIAGNOSIS — Z5111 Encounter for antineoplastic chemotherapy: Secondary | ICD-10-CM | POA: Diagnosis present

## 2018-10-21 DIAGNOSIS — G4700 Insomnia, unspecified: Secondary | ICD-10-CM

## 2018-10-21 DIAGNOSIS — Z79899 Other long term (current) drug therapy: Secondary | ICD-10-CM | POA: Diagnosis not present

## 2018-10-21 DIAGNOSIS — D649 Anemia, unspecified: Secondary | ICD-10-CM | POA: Insufficient documentation

## 2018-10-21 DIAGNOSIS — C184 Malignant neoplasm of transverse colon: Secondary | ICD-10-CM | POA: Insufficient documentation

## 2018-10-21 DIAGNOSIS — G620 Drug-induced polyneuropathy: Secondary | ICD-10-CM

## 2018-10-21 DIAGNOSIS — C787 Secondary malignant neoplasm of liver and intrahepatic bile duct: Secondary | ICD-10-CM | POA: Insufficient documentation

## 2018-10-21 DIAGNOSIS — C778 Secondary and unspecified malignant neoplasm of lymph nodes of multiple regions: Secondary | ICD-10-CM | POA: Diagnosis not present

## 2018-10-21 DIAGNOSIS — C187 Malignant neoplasm of sigmoid colon: Secondary | ICD-10-CM

## 2018-10-21 DIAGNOSIS — Z7189 Other specified counseling: Secondary | ICD-10-CM

## 2018-10-21 DIAGNOSIS — D500 Iron deficiency anemia secondary to blood loss (chronic): Secondary | ICD-10-CM

## 2018-10-21 DIAGNOSIS — E119 Type 2 diabetes mellitus without complications: Secondary | ICD-10-CM

## 2018-10-21 LAB — CBC WITH DIFFERENTIAL (CANCER CENTER ONLY)
Abs Immature Granulocytes: 0.02 10*3/uL (ref 0.00–0.07)
Basophils Absolute: 0 10*3/uL (ref 0.0–0.1)
Basophils Relative: 1 %
Eosinophils Absolute: 0.1 10*3/uL (ref 0.0–0.5)
Eosinophils Relative: 2 %
HCT: 44 % (ref 36.0–46.0)
Hemoglobin: 14.1 g/dL (ref 12.0–15.0)
Immature Granulocytes: 0 %
Lymphocytes Relative: 21 %
Lymphs Abs: 1.4 10*3/uL (ref 0.7–4.0)
MCH: 30.5 pg (ref 26.0–34.0)
MCHC: 32 g/dL (ref 30.0–36.0)
MCV: 95.2 fL (ref 80.0–100.0)
Monocytes Absolute: 0.3 10*3/uL (ref 0.1–1.0)
Monocytes Relative: 5 %
Neutro Abs: 5 10*3/uL (ref 1.7–7.7)
Neutrophils Relative %: 71 %
Platelet Count: 185 10*3/uL (ref 150–400)
RBC: 4.62 MIL/uL (ref 3.87–5.11)
RDW: 16.4 % — ABNORMAL HIGH (ref 11.5–15.5)
WBC Count: 6.9 10*3/uL (ref 4.0–10.5)
nRBC: 0 % (ref 0.0–0.2)

## 2018-10-21 LAB — CMP (CANCER CENTER ONLY)
ALT: 16 U/L (ref 0–44)
AST: 21 U/L (ref 15–41)
Albumin: 3.7 g/dL (ref 3.5–5.0)
Alkaline Phosphatase: 150 U/L — ABNORMAL HIGH (ref 38–126)
Anion gap: 9 (ref 5–15)
BUN: 9 mg/dL (ref 6–20)
CO2: 26 mmol/L (ref 22–32)
Calcium: 8.4 mg/dL — ABNORMAL LOW (ref 8.9–10.3)
Chloride: 104 mmol/L (ref 98–111)
Creatinine: 0.85 mg/dL (ref 0.44–1.00)
GFR, Est AFR Am: >60 mL/min (ref >60)
GFR, Estimated: >60 mL/min (ref >60)
Glucose, Bld: 194 mg/dL — ABNORMAL HIGH (ref 70–99)
Potassium: 4 mmol/L (ref 3.5–5.1)
Sodium: 139 mmol/L (ref 135–145)
Total Bilirubin: 0.4 mg/dL (ref 0.3–1.2)
Total Protein: 7.6 g/dL (ref 6.5–8.1)

## 2018-10-21 LAB — IRON AND TIBC
Iron: 53 ug/dL (ref 41–142)
Saturation Ratios: 13 % — ABNORMAL LOW (ref 21–57)
TIBC: 394 ug/dL (ref 236–444)
UIBC: 341 ug/dL (ref 120–384)

## 2018-10-21 LAB — TOTAL PROTEIN, URINE DIPSTICK: Protein, ur: NEGATIVE mg/dL

## 2018-10-21 LAB — FERRITIN: Ferritin: 29 ng/mL (ref 11–307)

## 2018-10-21 LAB — CEA (IN HOUSE-CHCC): CEA (CHCC-In House): 312 ng/mL — ABNORMAL HIGH (ref 0.00–5.00)

## 2018-10-21 MED ORDER — PALONOSETRON HCL INJECTION 0.25 MG/5ML
0.2500 mg | Freq: Once | INTRAVENOUS | Status: AC
  Administered 2018-10-21: 0.25 mg via INTRAVENOUS

## 2018-10-21 MED ORDER — HEPARIN SOD (PORK) LOCK FLUSH 100 UNIT/ML IV SOLN
500.0000 [IU] | Freq: Once | INTRAVENOUS | Status: AC | PRN
  Administered 2018-10-21: 500 [IU]
  Filled 2018-10-21: qty 5

## 2018-10-21 MED ORDER — OXALIPLATIN CHEMO INJECTION 100 MG/20ML
75.0000 mg/m2 | Freq: Once | INTRAVENOUS | Status: AC
  Administered 2018-10-21: 140 mg via INTRAVENOUS
  Filled 2018-10-21: qty 20

## 2018-10-21 MED ORDER — DEXTROSE 5 % IV SOLN
Freq: Once | INTRAVENOUS | Status: AC
  Administered 2018-10-21: 13:00:00 via INTRAVENOUS
  Filled 2018-10-21: qty 250

## 2018-10-21 MED ORDER — PALONOSETRON HCL INJECTION 0.25 MG/5ML
INTRAVENOUS | Status: AC
  Filled 2018-10-21: qty 5

## 2018-10-21 MED ORDER — SODIUM CHLORIDE 0.9% FLUSH
10.0000 mL | INTRAVENOUS | Status: DC | PRN
  Administered 2018-10-21: 10 mL
  Filled 2018-10-21: qty 10

## 2018-10-21 MED ORDER — SODIUM CHLORIDE 0.9% FLUSH
10.0000 mL | Freq: Once | INTRAVENOUS | Status: AC | PRN
  Administered 2018-10-21: 10 mL
  Filled 2018-10-21: qty 10

## 2018-10-21 MED ORDER — SODIUM CHLORIDE 0.9 % IV SOLN
Freq: Once | INTRAVENOUS | Status: AC
  Administered 2018-10-21: 14:00:00 via INTRAVENOUS
  Filled 2018-10-21: qty 5

## 2018-10-21 NOTE — Telephone Encounter (Signed)
No los was entered for 7/30.  Patient was on her last scheduled treatment today.  Went and spoke with the MD nurse and the patient need another appt in 3 weeks from today.  Scheduled appt Per me and MD nurse conversation.  Spoke with patient while she was in treatment and she is aware of her appt date and time.

## 2018-10-21 NOTE — Patient Instructions (Signed)
[place] Discharge Instructions for Patients Receiving Chemotherapy  Today you received the following chemotherapy agents: Oxaliplatin (Eloxatin)  To help prevent nausea and vomiting after your treatment, we encourage you to take your nausea medication as directed.   If you develop nausea and vomiting that is not controlled by your nausea medication, call the clinic.   BELOW ARE SYMPTOMS THAT SHOULD BE REPORTED IMMEDIATELY:  *FEVER GREATER THAN 100.5 F  *CHILLS WITH OR WITHOUT FEVER  NAUSEA AND VOMITING THAT IS NOT CONTROLLED WITH YOUR NAUSEA MEDICATION  *UNUSUAL SHORTNESS OF BREATH  *UNUSUAL BRUISING OR BLEEDING  TENDERNESS IN MOUTH AND THROAT WITH OR WITHOUT PRESENCE OF ULCERS  *URINARY PROBLEMS  *BOWEL PROBLEMS  UNUSUAL RASH Items with * indicate a potential emergency and should be followed up as soon as possible.  Feel free to call the clinic should you have any questions or concerns. The clinic phone number is [phone].  Please show the [facility] at check-in to the Emergency Department and triage nurse.  Coronavirus (COVID-19) Are you at risk?  Are you at risk for the Coronavirus (COVID-19)?  To be considered HIGH RISK for Coronavirus (COVID-19), you have to meet the following criteria:  . Traveled to [place], [place], [place], [place] or [place]; or in the [place] to [place], [place], [place], or [place]; and have fever, cough, and shortness of breath within the last 2 weeks of travel OR . Been in close contact with a person diagnosed with COVID-19 within the last 2 weeks and have fever, cough, and shortness of breath . IF YOU DO NOT MEET THESE CRITERIA, YOU ARE CONSIDERED LOW RISK FOR COVID-19.  What to do if you are HIGH RISK for COVID-19?  Marland Kitchen If you are having a medical emergency, call 911. . Seek medical care right away. Before you go to a doctor's office, urgent care or emergency department, call ahead and tell them  about your recent travel, contact with someone diagnosed with COVID-19, and your symptoms. You should receive instructions from your physician's office regarding next steps of care.  . When you arrive at healthcare provider, tell the healthcare staff immediately you have returned from visiting [place], [place], [place], [place] or [place]; or traveled in the [place] to [place], [place], [place], or [place]; in the last two weeks or you have been in close contact with a person diagnosed with COVID-19 in the last 2 weeks.   . Tell the health care staff about your symptoms: fever, cough and shortness of breath. . After you have been seen by a medical provider, you will be either: o Tested for (COVID-19) and discharged home on quarantine except to seek medical care if symptoms worsen, and asked to  - Stay home and avoid contact with others until you get your results (4-5 days)  - Avoid travel on public transportation if possible (such as bus, train, or airplane) or o Sent to the Emergency Department by EMS for evaluation, COVID-19 testing, and possible admission depending on your condition and test results.  What to do if you are LOW RISK for COVID-19?  Reduce your risk of any infection by using the same precautions used for avoiding the common cold or flu:  Marland Kitchen Wash your hands often with soap and warm water for at least 20 seconds.  If soap and water are not readily available, use an alcohol-based hand sanitizer with at least 60% alcohol.  . If coughing or  sneezing, cover your mouth and nose by coughing or sneezing into the elbow areas of your shirt or coat, into a tissue or into your sleeve (not your hands). . Avoid shaking hands with others and consider head nods or verbal greetings only. . Avoid touching your eyes, nose, or mouth with unwashed hands.  . Avoid close contact with people who are sick. . Avoid places or events with large numbers of people in one location, like concerts or  sporting events. . Carefully consider travel plans you have or are making. . If you are planning any travel outside or inside the [place], visit the CDC's Travelers' Health webpage for the latest health notices. . If you have some symptoms but not all symptoms, continue to monitor at home and seek medical attention if your symptoms worsen. . If you are having a medical emergency, call 911.   [place] / e-Visit: [email or web address]         [facility]: [facility]: [phone]                   [name]: [phone]

## 2018-10-23 ENCOUNTER — Encounter: Payer: Self-pay | Admitting: Hematology

## 2018-11-03 ENCOUNTER — Encounter: Payer: Self-pay | Admitting: Hematology

## 2018-11-05 ENCOUNTER — Telehealth: Payer: Self-pay

## 2018-11-05 NOTE — Telephone Encounter (Signed)
Oral Oncology Patient Advocate Encounter  [place] attempted to reach the patient to refill her Xeloda 3 times with no success.  I called and spoke with the patient today. She does not have any Xeloda on hand right now but states she will need to restart on 8/20. I scheduled her Xeloda to be filled and shipped to her on 8/17 for delivery on 8/18.  The patient verbalized understanding and great appreciation.  [place] [facility] [place] Phone [phone] Fax [phone] 11/05/2018   11:35 AM

## 2018-11-08 MED FILL — CAPECITABINE 500 MG TABS: 500 | 21 days supply | Qty: 84 | Fill #1

## 2018-11-09 ENCOUNTER — Other Ambulatory Visit: Payer: Self-pay | Admitting: Adult Health

## 2018-11-09 ENCOUNTER — Other Ambulatory Visit: Payer: Self-pay | Admitting: Hematology

## 2018-11-09 DIAGNOSIS — Z95828 Presence of other vascular implants and grafts: Secondary | ICD-10-CM

## 2018-11-10 ENCOUNTER — Telehealth: Payer: Self-pay

## 2018-11-10 NOTE — Progress Notes (Signed)
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 Abdominopelvic adenopathy. Rectosigmoid mass with suggestion of partial obstruction as evidenced by large colonic stool burden more proximally. 2.  Aortic Atherosclerosis (ICD10-I70.0).  This is age advanced.   05/31/2017 Tumor Marker   CEA 353.3 (elevated) AFP 4.5 (normal)   05/31/2017 Initial Biopsy   Diagnosis Colon, biopsy, sigmoid - INVASIVE ADENOCARCINOMA - SEE COMMENT   05/31/2017 Imaging   CT CHEST IMPRESSION: 1. No evidence of metastatic disease in the chest. 2. No acute findings.  Aortic Atherosclerosis (ICD10-I70.0).    05/31/2017 Procedure   Colonoscopy per Dr. Watt Climes Findings: An infiltrative and ulcerated partially obstructing medium-sized mass was found in the recto-sigmoid colon. The mass was circumferential. The mass measured four cm in length. No bleeding was present.   Impression - One small polyp in the rectum. - The examination was otherwise normal. - Malignant partially obstructing tumor in  the recto-sigmoid colon. Biopsied. - One medium polyp in the proximal sigmoid colon. - The examination was otherwise normal. - Internal hemorrhoids.   06/03/2017 Initial Diagnosis   Malignant neoplasm of transverse colon ([place])   06/11/2017 Cancer Staging   Staging form: Colon and Rectum, AJCC 8th Edition - Clinical stage from 06/11/2017: Stage IVA (cTX, cN1b, pM1a) - Signed by Truitt Merle, MD on 06/15/2017   06/11/2017 Pathology Results   Liver biopsy confirmed metastatic colon cancer   07/23/2017 Imaging   CT CAP IMPRESSION: 1. Mild progression of hepatic metastasis. 2. Similar rectosigmoid primary with abdominopelvic nodal metastasis. 3.  No acute process or evidence of metastatic disease in the chest. 4. Aortic Atherosclerosis (ICD10-I70.0). Coronary artery atherosclerosis. This is age advanced. 5. Proximal colonic constipation, again suggesting a component of partial obstruction at the primary site. 6. Mild ascending aortic dilatation at 4.1 cm.   08/25/2017 - 05/10/2018 Chemotherapy   -Xeloda 1563m BID 1 week on and 1 week off starting 08/25/17. Due to her worsening hand-foot syndrome her dose was reduced to 15032min the AM and 100013mn the PM. Due to disease progression we swithced her to CAPOX  -Vectibix every 2 weeks starting 08/25/17. Due to skin rash will stop starting 05/10/18.     12/14/2017 Imaging   IMPRESSION: 1. Response to therapy. Marked decrease in hepatic metastatic burden. More mild decrease in abdominopelvic adenopathy and definition of rectosigmoid primary. 2.  No acute process or evidence of metastatic disease in the chest. 3.  Aortic Atherosclerosis (ICD10-I70.0).    04/28/2018 Imaging   CT CAP 04/28/18  IMPRESSION: 1. Index liver metastases measured on the previous study show no substantial interval change although new liver lesions on today's exam are concerning for progressive disease. 2.  Mild lymphadenopathy in the upper abdomen is stable. 3. Persistent mild  ill-defined wall thickening in the distal sigmoid colon near the rectosigmoid junction. 4.  Aortic Atherosclerois (ICD10-170.0)    05/17/2018 -  Chemotherapy   CAPOX every 3 weeks with Xeloda 1531m BID 2 weeks on/1week off starting 05/17/18 -Add Avastin to first cycle.    09/09/2018 Imaging   CT CAP W Contrast IMPRESSION: 1. Slight interval decrease in size one of the right hepatic lobe lesions. Additional lesions within the liver are similar when compared to recent prior exam. 2. Mild adenopathy within the abdomen is stable. 3. Similar-appearing thick walled distal sigmoid colon.      CURRENT THERAPY:  Second line CAPOX and Avastin every 3 weeks with Xeloda 15016mBID 2 weeks on/1 week off starting 05/17/18. Dose reduced oxaliplatin due to neuropathy/cold sensitivity with cycle 6 and again with 7.   INTERVAL HISTORY:  Beverly Schwartz here for a follow up and treatment. She presents to the clinic alone. She notes she is doing well overall. She notes left abdominal pain occasionally, but this is manageable so far. She denies constipation. She takes prophylactic miralax. She does note left foot pain from hand-foot syndrome. It does hurt sometimes to walk around. She notes she has been off Xeloda a week and plans to start next cycle today. She notes she has not skipped Xeloda dose. She notes after infusion it takes 5-6 days recover and her appetite improves.  She notes she plans to move to her [place]'s land this [place].  REVIEW OF SYSTEMS:   Constitutional: Denies fevers, chills or abnormal weight loss Eyes: Denies blurriness of vision Ears, nose, mouth, throat, and face: Denies mucositis or sore throat Respiratory: Denies cough, dyspnea or wheezes Cardiovascular: Denies palpitation, chest discomfort or lower extremity swelling Gastrointestinal:  Denies nausea, heartburn or change in bowel habits (+) occasional left foot pain  Skin: Denies abnormal skin rashes (+) Hand-foot syndrome,  mainly of medial left foot  Lymphatics: Denies new lymphadenopathy or easy bruising Neurological:Denies numbness, tingling or new weaknesses Behavioral/Psych: Mood is stable, no new changes  All other systems were reviewed with the patient and are negative.  MEDICAL HISTORY:  Past Medical History:  Diagnosis Date  . Anxiety   . [place]   colon, liver  . Chest pain 10/18/2018   Patient c/o chest pain right side yesterday intermitten lasting 3-4 hours.  KeRowe RobertPA notified  . Colon cancer ([facility]  . Complication of anesthesia   . Depression   . Diabetes mellitus   . Dizziness   . Family history of breast cancer   . Family history of stomach cancer   . Hyperlipidemia   . Hypertension   . Hypothyroidism   . Obesity   . PONV (postoperative nausea and vomiting)   . Tachycardia     SURGICAL HISTORY: Past Surgical History:  Procedure Laterality Date  . FLEXIBLE SIGMOIDOSCOPY N/A 05/31/2017   Procedure: FLEXIBLE SIGMOIDOSCOPY;  Surgeon: MaClarene EssexMD;  Location: [facility];  Service: Endoscopy;  Laterality: N/A;  . IR IMAGING GUIDED PORT INSERTION  10/19/2018  . WISDOM TOOTH EXTRACTION     age 48's  I have reviewed the social history and family history with the patient and they are unchanged from previous note.  ALLERGIES:  is allergic to bupropion and amoxicillin.  MEDICATIONS:  Current Outpatient Medications  Medication Sig Dispense Refill  . acetaminophen-codeine (TYLENOL #3) 300-30 MG tablet Take 1 tablet by  mouth every 6 (six) hours as needed for moderate pain. 30 tablet 0  . albuterol (PROVENTIL HFA;VENTOLIN HFA) 108 (90 Base) MCG/ACT inhaler Inhale 2 puffs into the lungs every 6 (six) hours as needed for wheezing or shortness of breath. 1 Inhaler 0  . ALPRAZolam (XANAX) 1 MG tablet TAKE 1 TABLET BY MOUTH AT BEDTIME AS NEEDED FOR ANXIETY 30 tablet 0  . amLODipine (NORVASC) 5 MG tablet Take 1 tablet (5 mg total) by mouth daily. 90 tablet 0  . benzonatate  (TESSALON) 100 MG capsule Take 1 capsule (100 mg total) by mouth 3 (three) times daily as needed for cough. 20 capsule 0  . capecitabine (XELODA) 500 MG tablet TAKE 3 TABLETS (1,500 MG TOTAL) BY MOUTH 2 (TWO) TIMES DAILY AFTER A MEAL. TAKE FOR 14 DAYS ON, 7 DAYS OFF, REPEAT EVERY 21 DAYS. 84 tablet 1  . cetirizine (ZYRTEC ALLERGY) 10 MG tablet Take 1 tablet (10 mg total) by mouth daily. 30 tablet 1  . CINNAMON PO Take 1 tablet by mouth daily.     . clindamycin (CLINDAGEL) 1 % gel APPLY TO AFFECTED AREA TWICE A DAY 60 g 1  . fluticasone (FLONASE) 50 MCG/ACT nasal spray Place 1 spray into both nostrils daily. (Patient taking differently: Place 1 spray into both nostrils daily as needed for allergies. ) 16 g 2  . insulin NPH-regular Human (70-30) 100 UNIT/ML injection 17 Units with breakfast, 10 Units with dinner 10 mL 2  . Insulin Syringe-Needle U-100 (BD INSULIN SYRINGE U/F) 31G X 5/16" 1 ML MISC Inject twice a day as directed 200 each 3  . levothyroxine (SYNTHROID, LEVOTHROID) 112 MCG tablet Take 1 tablet (112 mcg total) by mouth daily before breakfast. 90 tablet 3  . lidocaine-prilocaine (EMLA) cream APPLY 1 APPLICATION TOPICALLY AS NEEDED. 30 g 0  . lisinopril (ZESTRIL) 5 MG tablet Take 1 tablet (5 mg total) by mouth daily. PATIENT MUST HAVE OFFICE VISIT PRIOR TO ANY FURTHER REFILLS 30 tablet 0  . mirtazapine (REMERON) 7.5 MG tablet TAKE 1 TABLET (7.5 MG TOTAL) BY MOUTH AT BEDTIME. 90 tablet 1  . Omega-3 Fatty Acids (FISH OIL) 1000 MG CAPS Take 1,000 mg by mouth daily.     . polyethylene glycol (MIRALAX / GLYCOLAX) packet Take 17 g by mouth daily. 14 each 0  . prochlorperazine (COMPAZINE) 10 MG tablet Take 1 tablet (10 mg total) by mouth every 6 (six) hours as needed (NAUSEA). 30 tablet 1  . TURMERIC PO Take 1 capsule by mouth daily.      No current facility-administered medications for this visit.    Facility-Administered Medications Ordered in Other Visits  Medication Dose Route Frequency  Provider Last Rate Last Dose  . 0.9 %  sodium chloride infusion   Intravenous Continuous Truitt Merle, MD   Stopped at 11/11/18 1330  . sodium chloride flush (NS) 0.9 % injection 10 mL  10 mL Intracatheter PRN Truitt Merle, MD   10 mL at 11/11/18 1548    PHYSICAL EXAMINATION: ECOG PERFORMANCE STATUS: 1 - Symptomatic but completely ambulatory  Vitals:   11/11/18 1002  BP: 139/89  Pulse: 97  Resp: 18  Temp: 98.7 F (37.1 C)  SpO2: 99%   Filed Weights   11/11/18 1002  Weight: 163 lb 8 oz (74.2 kg)    GENERAL:alert, no distress and comfortable SKIN: skin color, texture, turgor are normal, no rashes or significant lesions (+) Skin dryness of b/l feet, Skin erythema of sole of left foot EYES: normal,  Conjunctiva are pink and non-injected, sclera clear  NECK: supple, thyroid normal size, non-tender, without nodularity LYMPH:  no palpable lymphadenopathy in the cervical, axillary  LUNGS: clear to auscultation and percussion with normal breathing effort HEART: regular rate & rhythm and no murmurs and no lower extremity edema ABDOMEN:abdomen soft, non-tender and normal bowel sounds Musculoskeletal:no cyanosis of digits and no clubbing  NEURO: alert & oriented x 3 with fluent speech, no focal motor/sensory deficits  LABORATORY DATA:  I have reviewed the data as listed CBC Latest Ref Rng & Units 11/11/2018 10/21/2018 10/19/2018  WBC 4.0 - 10.5 K/uL 7.4 6.9 6.2  Hemoglobin 12.0 - 15.0 g/dL 13.2 14.1 14.0  Hematocrit 36.0 - 46.0 % 40.3 44.0 43.1  Platelets 150 - 400 K/uL 207 185 174     CMP Latest Ref Rng & Units 11/11/2018 10/21/2018 09/17/2018  Glucose 70 - 99 mg/dL 181(H) 194(H) 171(H)  BUN 6 - 20 mg/dL _0 Creatinine 0.44 - 1.00 mg/dL 0.81 0.85 0.88  Sodium 135 - 145 mmol/L 140 139 142  Potassium 3.5 - 5.1 mmol/L 4.0 4.0 3.7  Chloride 98 - 111 mmol/L 106 104 106  CO2 22 - 32 mmol/L 21(L) 26 26  Calcium 8.9 - 10.3 mg/dL 8.3(L) 8.4(L) 8.3(L)  Total Protein 6.5 - 8.1 g/dL 6.9 7.6 7.0   Total Bilirubin 0.3 - 1.2 mg/dL 0.5 0.4 0.4  Alkaline Phos 38 - 126 U/L 129(H) 150(H) 134(H)  AST 15 - 41 U/L _1 ALT 0 - 44 U/L _2 RADIOGRAPHIC STUDIES: I have personally reviewed the radiological images as listed and agreed with the findings in the report. No results found.   ASSESSMENT & PLAN:  Beverly Schwartz is a 48 y.o. female with    1. Sigmoid adenocarcinoma, with abdominopelvic adenopathy andhepatic metastasis, stage IV, MSI-stable , KRAS/NRAS/BRAF wild type -Diagnosed in 05/2017. Treated withfirst-line chemotherapyXelodaandVectibixand tolerated well. Patient declined intensive chemo regiments previously due to fear of side effects.Unfortunatelyshe had mild disease progressionafter 8 months of therapy. -She is currently on second lineCAPOX and Avastin, she does not want a port orthe infusion pump at this point.  -She started CAPOXand Avastinon 05/17/18. Shetolerated thefirst cycle moderately well with fatigue, nausea, and muscle cramps in her handsfor 3 days, recovered well. -S/p 4 cycles she developed more neuropathy lately, mainly tingling and primarily the first week of cycle.I have reduced her oxaliplatin to 35m/m2 every 3 weeks  -Based on CT CAP from 09/09/18 she hasat least3 known liver lesions which has decreased significantly since chemo, largest 2.7cm now. It is possible but still difficult dodotarget liver therapy and colon surgery at this point, we discussed options of microwave ablation and SBRT to her liver lesion. Will reconsider after next scan.  -She had PAC placed on 10/19/18. She does not like taking oxaliplatin, but given she is tolerating moderately well and it is still controlling her disease, I recommend she continue q3 weeks. I reduced her dose to 75 mg/m2.  -She tolerated dose reduction much better. She has mild to moderate hand-foot syndrome mainly of left foot. Her neuropathy has improved, she currently does not have  neurological symptoms.  -Labs reviewed, CBC and CMP WNL except BG 181, Ca 8.3, Alk Phos 129. Overall adequate to proceed with Oxaliplatin today at same dose and Avastin. She will start next cycle Xeloda today.  -Plan to repeat san in 11/2018 -I encouraged her to do more whole body activities and  exercise.  -F/u in 3 weeks    2. DM, HTN, HL -f/u with PCP -Onpropranololfor HTN currently.But not well controlled -Started her on 75mamlodipineon 07/23/18. Increased her to 130mon 08/06/18.  -Her PCP recently started her on lisinopril5m36m 08/18/18. -I advised her to increased her amlodipine to 12m78mf still high after 2 weeks, she can increase lisinopril to 12mg46mP at 139/89 today (11/11/18)   3. Financial and Social issues, Anxiety, depression -Shefollows up as needed withSocial Worker Lisa Hollice Espy is currently on Xanax  -Shehas regained insurancewithBlue cross insurance in 2020  4.Goal of care discussion  -She understands the goal of care is palliative, her cancer is incurable. The goal of therapy is to prolong her life and improve her quality of life. -She is full code for now   5. Hand-foot syndrome, secondary to Xeloda  -She developed mild hand-foot syndrome, secondary to Xeloda.This has much improved with a dose reduction of Xeloda. -She will continue to use Urea cream -Has much improved, stable.  -Has mild skin dryness and skin erythema on soles of feet on exam today (11/11/18)  6.Insomnia, anxiety  -She has had trouble sleeping from anxiety and allergies  -She has Xanax which she uses as needed. -Ipreviouslyprescribed her Mirtazapine for depression and insomnia in 07/2018. She has not taken it yet. She will think about it. She is also considering Cymbalta with her PCP. -She will f/u with [place] as needed. 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 This is most significant and effecting her activity level during first week of cycleand it improves afterwards, she only has mild tingling now -I reduced oxaliplatin dose starting with cycle 6 and again with cycle 7.  -Her neuropathy has improved. She currently does not feel neurological symptoms.    Plan -I refilled her Xanax today  -Labs reviewed and adequate to proceed with oxaliplatin and Avastin  -Continue Xeloda 1500mg 76meks on/1 week  -Lab, flush, f/u and oxaliplatin in 3 weeks  -plan to repeat CT CAP w contrast a few days before OV in 6 weeks    No problem-specific Assessment & Plan notes found for this encounter.   Orders Placed This Encounter  Procedures  . CT Abdomen Pelvis W Contrast    Standing Status:   Future    Standing Expiration Date:   11/11/2019    Order Specific Question:   If indicated for the ordered procedure, I authorize the administration of contrast media per Radiology protocol    Answer:   Yes    Order Specific Question:   Is patient pregnant?    Answer:   No    Order Specific Question:   [facility]    [facility]   Is Oral Contrast requested for this exam?    Answer:   Yes, Per Radiology protocol    Order Specific Question:   Radiology Contrast Protocol - do NOT remove file path    Answer:   \\[email or web address]  . CT Chest W Contrast    Standing Status:   Future    Standing Expiration Date:   11/11/2019    Order Specific Question:   If indicated for the ordered procedure, I authorize the administration of contrast media per Radiology protocol    Answer:   Yes    Order Specific Question:   Is patient pregnant?  Answer:   No    Order Specific Question:   Preferred imaging location?    Answer:   [facility]    Order Specific Question:   Radiology Contrast Protocol - do NOT remove file path    Answer:    \\[email or web address]   All questions were answered. The patient knows to call the clinic with any problems, questions or concerns. No barriers to learning was detected. I spent 20 minutes counseling the patient face to face. The total time spent in the appointment was 25 minutes and more than 50% was on counseling and review of test results     Truitt Merle, MD 11/11/2018   I, Joslyn Devon, am acting as scribe for Truitt Merle, MD.   I have reviewed the above documentation for accuracy and completeness, and I agree with the above.

## 2018-11-10 NOTE — Telephone Encounter (Signed)
Left vm for pt regarding prescreening questions for appointment on 8/20. ° °

## 2018-11-11 ENCOUNTER — Inpatient Hospital Stay: Payer: BC Managed Care – PPO

## 2018-11-11 ENCOUNTER — Inpatient Hospital Stay (HOSPITAL_BASED_OUTPATIENT_CLINIC_OR_DEPARTMENT_OTHER): Payer: BC Managed Care – PPO | Admitting: Hematology

## 2018-11-11 ENCOUNTER — Telehealth: Payer: Self-pay | Admitting: Hematology

## 2018-11-11 ENCOUNTER — Inpatient Hospital Stay: Payer: BC Managed Care – PPO | Attending: Hematology

## 2018-11-11 ENCOUNTER — Encounter: Payer: Self-pay | Admitting: Hematology

## 2018-11-11 ENCOUNTER — Other Ambulatory Visit: Payer: Self-pay

## 2018-11-11 VITALS — BP 143/85

## 2018-11-11 VITALS — BP 139/89 | HR 97 | Temp 98.7°F | Resp 18 | Ht 65.0 in | Wt 163.5 lb

## 2018-11-11 DIAGNOSIS — C184 Malignant neoplasm of transverse colon: Secondary | ICD-10-CM | POA: Diagnosis not present

## 2018-11-11 DIAGNOSIS — D500 Iron deficiency anemia secondary to blood loss (chronic): Secondary | ICD-10-CM

## 2018-11-11 DIAGNOSIS — Z79899 Other long term (current) drug therapy: Secondary | ICD-10-CM | POA: Insufficient documentation

## 2018-11-11 DIAGNOSIS — C19 Malignant neoplasm of rectosigmoid junction: Secondary | ICD-10-CM

## 2018-11-11 DIAGNOSIS — Z5112 Encounter for antineoplastic immunotherapy: Secondary | ICD-10-CM | POA: Insufficient documentation

## 2018-11-11 DIAGNOSIS — I10 Essential (primary) hypertension: Secondary | ICD-10-CM | POA: Insufficient documentation

## 2018-11-11 DIAGNOSIS — Z5111 Encounter for antineoplastic chemotherapy: Secondary | ICD-10-CM | POA: Diagnosis present

## 2018-11-11 DIAGNOSIS — Z794 Long term (current) use of insulin: Secondary | ICD-10-CM | POA: Diagnosis not present

## 2018-11-11 DIAGNOSIS — F419 Anxiety disorder, unspecified: Secondary | ICD-10-CM

## 2018-11-11 DIAGNOSIS — E1142 Type 2 diabetes mellitus with diabetic polyneuropathy: Secondary | ICD-10-CM | POA: Insufficient documentation

## 2018-11-11 DIAGNOSIS — C787 Secondary malignant neoplasm of liver and intrahepatic bile duct: Secondary | ICD-10-CM | POA: Diagnosis not present

## 2018-11-11 DIAGNOSIS — C187 Malignant neoplasm of sigmoid colon: Secondary | ICD-10-CM

## 2018-11-11 DIAGNOSIS — Z7189 Other specified counseling: Secondary | ICD-10-CM

## 2018-11-11 LAB — CBC WITH DIFFERENTIAL (CANCER CENTER ONLY)
Abs Immature Granulocytes: 0.01 10*3/uL (ref 0.00–0.07)
Basophils Absolute: 0 10*3/uL (ref 0.0–0.1)
Basophils Relative: 1 %
Eosinophils Absolute: 0.2 10*3/uL (ref 0.0–0.5)
Eosinophils Relative: 3 %
HCT: 40.3 % (ref 36.0–46.0)
Hemoglobin: 13.2 g/dL (ref 12.0–15.0)
Immature Granulocytes: 0 %
Lymphocytes Relative: 22 %
Lymphs Abs: 1.6 10*3/uL (ref 0.7–4.0)
MCH: 31.1 pg (ref 26.0–34.0)
MCHC: 32.8 g/dL (ref 30.0–36.0)
MCV: 94.8 fL (ref 80.0–100.0)
Monocytes Absolute: 0.6 10*3/uL (ref 0.1–1.0)
Monocytes Relative: 8 %
Neutro Abs: 4.9 10*3/uL (ref 1.7–7.7)
Neutrophils Relative %: 66 %
Platelet Count: 207 10*3/uL (ref 150–400)
RBC: 4.25 MIL/uL (ref 3.87–5.11)
RDW: 17.9 % — ABNORMAL HIGH (ref 11.5–15.5)
WBC Count: 7.4 10*3/uL (ref 4.0–10.5)
nRBC: 0 % (ref 0.0–0.2)

## 2018-11-11 LAB — CMP (CANCER CENTER ONLY)
ALT: 11 U/L (ref 0–44)
AST: 18 U/L (ref 15–41)
Albumin: 3.5 g/dL (ref 3.5–5.0)
Alkaline Phosphatase: 129 U/L — ABNORMAL HIGH (ref 38–126)
Anion gap: 13 (ref 5–15)
BUN: 10 mg/dL (ref 6–20)
CO2: 21 mmol/L — ABNORMAL LOW (ref 22–32)
Calcium: 8.3 mg/dL — ABNORMAL LOW (ref 8.9–10.3)
Chloride: 106 mmol/L (ref 98–111)
Creatinine: 0.81 mg/dL (ref 0.44–1.00)
GFR, Est AFR Am: >60 mL/min (ref >60)
GFR, Estimated: >60 mL/min (ref >60)
Glucose, Bld: 181 mg/dL — ABNORMAL HIGH (ref 70–99)
Potassium: 4 mmol/L (ref 3.5–5.1)
Sodium: 140 mmol/L (ref 135–145)
Total Bilirubin: 0.5 mg/dL (ref 0.3–1.2)
Total Protein: 6.9 g/dL (ref 6.5–8.1)

## 2018-11-11 LAB — TOTAL PROTEIN, URINE DIPSTICK: Protein, ur: NEGATIVE mg/dL

## 2018-11-11 MED ORDER — SODIUM CHLORIDE 0.9% FLUSH
10.0000 mL | INTRAVENOUS | Status: DC | PRN
  Administered 2018-11-11: 10 mL
  Filled 2018-11-11: qty 10

## 2018-11-11 MED ORDER — DEXTROSE 5 % IV SOLN
Freq: Once | INTRAVENOUS | Status: AC
  Administered 2018-11-11: 12:00:00 via INTRAVENOUS
  Filled 2018-11-11: qty 250

## 2018-11-11 MED ORDER — PALONOSETRON HCL INJECTION 0.25 MG/5ML
0.2500 mg | Freq: Once | INTRAVENOUS | Status: AC
  Administered 2018-11-11: 0.25 mg via INTRAVENOUS

## 2018-11-11 MED ORDER — SODIUM CHLORIDE 0.9 % IV SOLN
7.5000 mg/kg | Freq: Once | INTRAVENOUS | Status: AC
  Administered 2018-11-11: 550 mg via INTRAVENOUS
  Filled 2018-11-11: qty 16

## 2018-11-11 MED ORDER — OXALIPLATIN CHEMO INJECTION 100 MG/20ML
75.0000 mg/m2 | Freq: Once | INTRAVENOUS | Status: AC
  Administered 2018-11-11: 140 mg via INTRAVENOUS
  Filled 2018-11-11: qty 20

## 2018-11-11 MED ORDER — SODIUM CHLORIDE 0.9% FLUSH
10.0000 mL | Freq: Once | INTRAVENOUS | Status: AC | PRN
  Administered 2018-11-11: 10 mL
  Filled 2018-11-11: qty 10

## 2018-11-11 MED ORDER — SODIUM CHLORIDE 0.9 % IV SOLN
Freq: Once | INTRAVENOUS | Status: AC
  Administered 2018-11-11: 12:00:00 via INTRAVENOUS
  Filled 2018-11-11: qty 5

## 2018-11-11 MED ORDER — HEPARIN SOD (PORK) LOCK FLUSH 100 UNIT/ML IV SOLN
500.0000 [IU] | Freq: Once | INTRAVENOUS | Status: AC | PRN
  Administered 2018-11-11: 500 [IU]
  Filled 2018-11-11: qty 5

## 2018-11-11 MED ORDER — ALPRAZOLAM 1 MG PO TABS: ORAL_TABLET | ORAL | 0 refills | Status: DC

## 2018-11-11 MED ORDER — PALONOSETRON HCL INJECTION 0.25 MG/5ML
INTRAVENOUS | Status: AC
  Filled 2018-11-11: qty 5

## 2018-11-11 MED ORDER — SODIUM CHLORIDE 0.9 % IV SOLN
INTRAVENOUS | Status: AC
  Administered 2018-11-11: 13:00:00 via INTRAVENOUS
  Filled 2018-11-11: qty 250

## 2018-11-11 NOTE — Telephone Encounter (Signed)
Scheduled appt per 8/20 los.  Spoke with patient and she is aware of the appt date and time.

## 2018-11-11 NOTE — Addendum Note (Signed)
Addended by: Manuella Ghazi on: 11/11/2018 01:11 PM   Modules accepted: Orders

## 2018-11-11 NOTE — Addendum Note (Signed)
Addended by: Manuella Ghazi on: 11/11/2018 11:52 AM   Modules accepted: Orders

## 2018-11-11 NOTE — Patient Instructions (Signed)
[facility] Discharge Instructions for Patients Receiving Chemotherapy  Today you received the following chemotherapy agents: Oxaliplatin (Eloxatin)  To help prevent nausea and vomiting after your treatment, we encourage you to take your nausea medication as directed.   If you develop nausea and vomiting that is not controlled by your nausea medication, call the clinic.   BELOW ARE SYMPTOMS THAT SHOULD BE REPORTED IMMEDIATELY:  *FEVER GREATER THAN 100.5 F  *CHILLS WITH OR WITHOUT FEVER  NAUSEA AND VOMITING THAT IS NOT CONTROLLED WITH YOUR NAUSEA MEDICATION  *UNUSUAL SHORTNESS OF BREATH  *UNUSUAL BRUISING OR BLEEDING  TENDERNESS IN MOUTH AND THROAT WITH OR WITHOUT PRESENCE OF ULCERS  *URINARY PROBLEMS  *BOWEL PROBLEMS  UNUSUAL RASH Items with * indicate a potential emergency and should be followed up as soon as possible.  Feel free to call the clinic should you have any questions or concerns. The clinic phone number is [phone].  Please show the [facility] at check-in to the Emergency Department and triage nurse.  Coronavirus (COVID-19) Are you at risk?  Are you at risk for the Coronavirus (COVID-19)?  To be considered HIGH RISK for Coronavirus (COVID-19), you have to meet the following criteria:  . Traveled to [place], [place], [place], [place] or [place]; or in the [place] to [place], [place], [place], or [place]; and have fever, cough, and shortness of breath within the last 2 weeks of travel OR . Been in close contact with a person diagnosed with COVID-19 within the last 2 weeks and have fever, cough, and shortness of breath . IF YOU DO NOT MEET THESE CRITERIA, YOU ARE CONSIDERED LOW RISK FOR COVID-19.  What to do if you are HIGH RISK for COVID-19?  Marland Kitchen If you are having a medical emergency, call 911. . Seek medical care right away. Before you go to a doctor's office, urgent care or emergency department, call ahead and tell them  about your recent travel, contact with someone diagnosed with COVID-19, and your symptoms. You should receive instructions from your physician's office regarding next steps of care.  . When you arrive at healthcare provider, tell the healthcare staff immediately you have returned from visiting [place], [place], [place], [place] or [place]; or traveled in the [place] to [place], [place], [place], or [place]; in the last two weeks or you have been in close contact with a person diagnosed with COVID-19 in the last 2 weeks.   . Tell the health care staff about your symptoms: fever, cough and shortness of breath. . After you have been seen by a medical provider, you will be either: o Tested for (COVID-19) and discharged home on quarantine except to seek medical care if symptoms worsen, and asked to  - Stay home and avoid contact with others until you get your results (4-5 days)  - Avoid travel on public transportation if possible (such as bus, train, or airplane) or o Sent to the Emergency Department by EMS for evaluation, COVID-19 testing, and possible admission depending on your condition and test results.  What to do if you are LOW RISK for COVID-19?  Reduce your risk of any infection by using the same precautions used for avoiding the common cold or flu:  Marland Kitchen Wash your hands often with soap and warm water for at least 20 seconds.  If soap and water are not readily available, use an alcohol-based hand sanitizer with at least 60% alcohol.  . If coughing or  sneezing, cover your mouth and nose by coughing or sneezing into the elbow areas of your shirt or coat, into a tissue or into your sleeve (not your hands). . Avoid shaking hands with others and consider head nods or verbal greetings only. . Avoid touching your eyes, nose, or mouth with unwashed hands.  . Avoid close contact with people who are sick. . Avoid places or events with large numbers of people in one location, like concerts or  sporting events. . Carefully consider travel plans you have or are making. . If you are planning any travel outside or inside the [place], visit the CDC's Travelers' Health webpage for the latest health notices. . If you have some symptoms but not all symptoms, continue to monitor at home and seek medical attention if your symptoms worsen. . If you are having a medical emergency, call 911.   [place] / e-Visit: [email or web address]         [facility]: [facility]: [phone]                   [name]: [phone]

## 2018-11-23 ENCOUNTER — Other Ambulatory Visit: Payer: Self-pay | Admitting: Hematology

## 2018-11-23 DIAGNOSIS — C19 Malignant neoplasm of rectosigmoid junction: Secondary | ICD-10-CM

## 2018-11-30 MED FILL — CAPECITABINE 500 MG TABS: 500 | 21 days supply | Qty: 84 | Fill #0

## 2018-12-02 ENCOUNTER — Telehealth: Payer: Self-pay | Admitting: Nurse Practitioner

## 2018-12-02 ENCOUNTER — Other Ambulatory Visit: Payer: Self-pay

## 2018-12-02 ENCOUNTER — Inpatient Hospital Stay: Payer: BC Managed Care – PPO

## 2018-12-02 ENCOUNTER — Inpatient Hospital Stay (HOSPITAL_BASED_OUTPATIENT_CLINIC_OR_DEPARTMENT_OTHER): Payer: BC Managed Care – PPO | Admitting: Nurse Practitioner

## 2018-12-02 ENCOUNTER — Encounter: Payer: Self-pay | Admitting: Nurse Practitioner

## 2018-12-02 ENCOUNTER — Inpatient Hospital Stay: Payer: BC Managed Care – PPO | Attending: Hematology

## 2018-12-02 VITALS — BP 143/106 | HR 92 | Temp 98.7°F | Resp 18 | Ht 65.0 in | Wt 164.6 lb

## 2018-12-02 VITALS — BP 178/109

## 2018-12-02 DIAGNOSIS — C19 Malignant neoplasm of rectosigmoid junction: Secondary | ICD-10-CM

## 2018-12-02 DIAGNOSIS — C187 Malignant neoplasm of sigmoid colon: Secondary | ICD-10-CM | POA: Diagnosis not present

## 2018-12-02 DIAGNOSIS — I10 Essential (primary) hypertension: Secondary | ICD-10-CM | POA: Insufficient documentation

## 2018-12-02 DIAGNOSIS — Z79899 Other long term (current) drug therapy: Secondary | ICD-10-CM | POA: Insufficient documentation

## 2018-12-02 DIAGNOSIS — G4700 Insomnia, unspecified: Secondary | ICD-10-CM | POA: Diagnosis not present

## 2018-12-02 DIAGNOSIS — E119 Type 2 diabetes mellitus without complications: Secondary | ICD-10-CM | POA: Diagnosis not present

## 2018-12-02 DIAGNOSIS — G620 Drug-induced polyneuropathy: Secondary | ICD-10-CM | POA: Insufficient documentation

## 2018-12-02 DIAGNOSIS — F419 Anxiety disorder, unspecified: Secondary | ICD-10-CM | POA: Diagnosis not present

## 2018-12-02 DIAGNOSIS — D500 Iron deficiency anemia secondary to blood loss (chronic): Secondary | ICD-10-CM

## 2018-12-02 DIAGNOSIS — F329 Major depressive disorder, single episode, unspecified: Secondary | ICD-10-CM | POA: Diagnosis not present

## 2018-12-02 DIAGNOSIS — Z794 Long term (current) use of insulin: Secondary | ICD-10-CM | POA: Insufficient documentation

## 2018-12-02 DIAGNOSIS — C787 Secondary malignant neoplasm of liver and intrahepatic bile duct: Secondary | ICD-10-CM | POA: Insufficient documentation

## 2018-12-02 DIAGNOSIS — Z5111 Encounter for antineoplastic chemotherapy: Secondary | ICD-10-CM | POA: Insufficient documentation

## 2018-12-02 DIAGNOSIS — Z7189 Other specified counseling: Secondary | ICD-10-CM

## 2018-12-02 LAB — CMP (CANCER CENTER ONLY)
ALT: 10 U/L (ref 0–44)
AST: 21 U/L (ref 15–41)
Albumin: 3.6 g/dL (ref 3.5–5.0)
Alkaline Phosphatase: 147 U/L — ABNORMAL HIGH (ref 38–126)
Anion gap: 9 (ref 5–15)
BUN: 8 mg/dL (ref 6–20)
CO2: 25 mmol/L (ref 22–32)
Calcium: 8.2 mg/dL — ABNORMAL LOW (ref 8.9–10.3)
Chloride: 108 mmol/L (ref 98–111)
Creatinine: 0.78 mg/dL (ref 0.44–1.00)
GFR, Est AFR Am: >60 mL/min (ref >60)
GFR, Estimated: >60 mL/min (ref >60)
Glucose, Bld: 127 mg/dL — ABNORMAL HIGH (ref 70–99)
Potassium: 4.1 mmol/L (ref 3.5–5.1)
Sodium: 142 mmol/L (ref 135–145)
Total Bilirubin: 0.7 mg/dL (ref 0.3–1.2)
Total Protein: 6.9 g/dL (ref 6.5–8.1)

## 2018-12-02 LAB — IRON AND TIBC
Iron: 49 ug/dL (ref 41–142)
Saturation Ratios: 13 % — ABNORMAL LOW (ref 21–57)
TIBC: 388 ug/dL (ref 236–444)
UIBC: 339 ug/dL (ref 120–384)

## 2018-12-02 LAB — FERRITIN: Ferritin: 45 ng/mL (ref 11–307)

## 2018-12-02 LAB — CEA (IN HOUSE-CHCC): CEA (CHCC-In House): 331.67 ng/mL — ABNORMAL HIGH (ref 0.00–5.00)

## 2018-12-02 LAB — CBC WITH DIFFERENTIAL (CANCER CENTER ONLY)
Abs Immature Granulocytes: 0.02 10*3/uL (ref 0.00–0.07)
Basophils Absolute: 0 10*3/uL (ref 0.0–0.1)
Basophils Relative: 0 %
Eosinophils Absolute: 0.2 10*3/uL (ref 0.0–0.5)
Eosinophils Relative: 2 %
HCT: 39.1 % (ref 36.0–46.0)
Hemoglobin: 12.9 g/dL (ref 12.0–15.0)
Immature Granulocytes: 0 %
Lymphocytes Relative: 26 %
Lymphs Abs: 1.8 10*3/uL (ref 0.7–4.0)
MCH: 31.2 pg (ref 26.0–34.0)
MCHC: 33 g/dL (ref 30.0–36.0)
MCV: 94.4 fL (ref 80.0–100.0)
Monocytes Absolute: 0.5 10*3/uL (ref 0.1–1.0)
Monocytes Relative: 7 %
Neutro Abs: 4.3 10*3/uL (ref 1.7–7.7)
Neutrophils Relative %: 65 %
Platelet Count: 166 10*3/uL (ref 150–400)
RBC: 4.14 MIL/uL (ref 3.87–5.11)
RDW: 18.5 % — ABNORMAL HIGH (ref 11.5–15.5)
WBC Count: 6.8 10*3/uL (ref 4.0–10.5)
nRBC: 0 % (ref 0.0–0.2)

## 2018-12-02 LAB — TOTAL PROTEIN, URINE DIPSTICK: Protein, ur: <30 mg/dL — AB

## 2018-12-02 MED ORDER — AMLODIPINE BESYLATE 10 MG PO TABS: 10.0000 mg | ORAL_TABLET | Freq: Every day | ORAL | 0 refills | Status: DC

## 2018-12-02 MED ORDER — HEPARIN SOD (PORK) LOCK FLUSH 100 UNIT/ML IV SOLN
500.0000 [IU] | Freq: Once | INTRAVENOUS | Status: AC | PRN
  Administered 2018-12-02: 500 [IU]
  Filled 2018-12-02: qty 5

## 2018-12-02 MED ORDER — LISINOPRIL 5 MG PO TABS: 5.0000 mg | ORAL_TABLET | Freq: Every day | ORAL | 0 refills | Status: DC

## 2018-12-02 MED ORDER — DEXTROSE 5 % IV SOLN
Freq: Once | INTRAVENOUS | Status: AC
  Administered 2018-12-02: 13:00:00 via INTRAVENOUS
  Filled 2018-12-02: qty 250

## 2018-12-02 MED ORDER — PALONOSETRON HCL INJECTION 0.25 MG/5ML
INTRAVENOUS | Status: AC
  Filled 2018-12-02: qty 5

## 2018-12-02 MED ORDER — SODIUM CHLORIDE 0.9 % IV SOLN
Freq: Once | INTRAVENOUS | Status: AC
  Administered 2018-12-02: 13:00:00 via INTRAVENOUS
  Filled 2018-12-02: qty 5

## 2018-12-02 MED ORDER — SODIUM CHLORIDE 0.9% FLUSH
10.0000 mL | Freq: Once | INTRAVENOUS | Status: AC | PRN
  Administered 2018-12-02: 10 mL
  Filled 2018-12-02: qty 10

## 2018-12-02 MED ORDER — PALONOSETRON HCL INJECTION 0.25 MG/5ML
0.2500 mg | Freq: Once | INTRAVENOUS | Status: AC
  Administered 2018-12-02: 0.25 mg via INTRAVENOUS

## 2018-12-02 MED ORDER — SODIUM CHLORIDE 0.9% FLUSH
10.0000 mL | INTRAVENOUS | Status: DC | PRN
  Administered 2018-12-02: 10 mL
  Filled 2018-12-02: qty 10

## 2018-12-02 MED ORDER — OXALIPLATIN CHEMO INJECTION 100 MG/20ML
75.0000 mg/m2 | Freq: Once | INTRAVENOUS | Status: AC
  Administered 2018-12-02: 140 mg via INTRAVENOUS
  Filled 2018-12-02: qty 20

## 2018-12-02 NOTE — Progress Notes (Signed)
Kenney Houseman, Please call Ms. Vickey Huger and have her schedule OV for uncontrolled BP Thanks! Valetta Fuller

## 2018-12-02 NOTE — Progress Notes (Signed)
LVM for pt to call the office to schedule OV.  Charyl Bigger, CMA

## 2018-12-02 NOTE — Progress Notes (Signed)
[place]   [phone] [phone]) [phone]   Clinic Follow up Note   Patient Care Team: Beverly Grandchild, NP as PCP - General (Family Medicine) Delrae Rend, MD as Consulting Physician (Endocrinology) Truitt Merle, MD as Consulting Physician (Hematology) Alla Feeling, NP as Nurse Practitioner (Nurse Practitioner) Clarene Essex, MD as Consulting Physician (Gastroenterology) 12/02/2018  CHIEF COMPLAINT: Follow-up metastatic colon cancer  SUMMARY OF ONCOLOGIC HISTORY: Oncology History Overview Note  Cancer Staging Malignant neoplasm of rectosigmoid junction [facility]) Staging form: Colon and Rectum, AJCC 8th Edition - Clinical stage from 06/11/2017: Stage IVA (cTX, cN1b, pM1a) - Signed by Truitt Merle, MD on 06/15/2017     Malignant neoplasm of rectosigmoid junction ([place])  05/30/2017 Imaging   CT AP IMPRESSION: 1. 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 Abdominopelvic adenopathy. Rectosigmoid mass with suggestion of partial obstruction as evidenced by large colonic stool burden more proximally. 2.  Aortic Atherosclerosis (ICD10-I70.0).  This is age advanced.   05/31/2017 Tumor Marker   CEA 353.3 (elevated) AFP 4.5 (normal)   05/31/2017 Initial Biopsy   Diagnosis Colon, biopsy, sigmoid - INVASIVE ADENOCARCINOMA - SEE COMMENT   05/31/2017 Imaging   CT CHEST IMPRESSION: 1. No evidence of metastatic disease in the chest. 2. No acute findings.  Aortic Atherosclerosis (ICD10-I70.0).    05/31/2017 Procedure   Colonoscopy per Dr. Watt Climes Findings: An infiltrative and ulcerated partially obstructing medium-sized mass was found in the recto-sigmoid colon. The mass was circumferential. The mass measured four cm in length. No bleeding was present.   Impression - One small polyp in the rectum. - The examination was otherwise normal. - Malignant partially obstructing tumor in the recto-sigmoid colon.  Biopsied. - One medium polyp in the proximal sigmoid colon. - The examination was otherwise normal. - Internal hemorrhoids.   06/03/2017 Initial Diagnosis   Malignant neoplasm of transverse colon ([place])   06/11/2017 Cancer Staging   Staging form: Colon and Rectum, AJCC 8th Edition - Clinical stage from 06/11/2017: Stage IVA (cTX, cN1b, pM1a) - Signed by Truitt Merle, MD on 06/15/2017   06/11/2017 Pathology Results   Liver biopsy confirmed metastatic colon cancer   07/23/2017 Imaging   CT CAP IMPRESSION: 1. Mild progression of hepatic metastasis. 2. Similar rectosigmoid primary with abdominopelvic nodal metastasis. 3.  No acute process or evidence of metastatic disease in the chest. 4. Aortic Atherosclerosis (ICD10-I70.0). Coronary artery atherosclerosis. This is age advanced. 5. Proximal colonic constipation, again suggesting a component of partial obstruction at the primary site. 6. Mild ascending aortic dilatation at 4.1 cm.   08/25/2017 - 05/10/2018 Chemotherapy   -Xeloda '1500mg'$  BID 1 week on and 1 week off starting 08/25/17. Due to Beverly Schwartz worsening hand-foot syndrome Beverly Schwartz dose was reduced to '1500mg'$  in the AM and '1000mg'$  in the PM. Due to disease progression we swithced Beverly Schwartz to CAPOX  -Vectibix every 2 weeks starting 08/25/17. Due to skin rash will stop starting 05/10/18.     12/14/2017 Imaging   IMPRESSION: 1. Response to therapy. Marked decrease in hepatic metastatic burden. More mild decrease in abdominopelvic adenopathy and definition of rectosigmoid primary. 2.  No acute process or evidence of metastatic disease in the chest. 3.  Aortic Atherosclerosis (ICD10-I70.0).    04/28/2018 Imaging   CT CAP 04/28/18  IMPRESSION: 1. Index liver metastases measured on the previous study show no substantial interval change although new liver lesions on today's exam are concerning for progressive disease. 2. Mild lymphadenopathy in the upper abdomen is  stable. 3. Persistent mild ill-defined wall thickening  in the distal sigmoid colon near the rectosigmoid junction. 4.  Aortic Atherosclerois (ICD10-170.0)    05/17/2018 -  Chemotherapy   CAPOX every 3 weeks with Xeloda '1500mg'$  BID 2 weeks on/1week off starting 05/17/18 -Add Avastin to first cycle.    09/09/2018 Imaging   CT CAP W Contrast IMPRESSION: 1. Slight interval decrease in size one of the right hepatic lobe lesions. Additional lesions within the liver are similar when compared to recent prior exam. 2. Mild adenopathy within the abdomen is stable. 3. Similar-appearing thick walled distal sigmoid colon.     CURRENT THERAPY: Second line CAPOX and Avastin every 3 weeks with Xeloda '1500mg'$  BID 2 weeks on/1 week off starting 05/17/18. Dose reduced oxaliplatindue toneuropathy/cold sensitivity with cycle 6 and again with 7.  INTERVAL HISTORY: Beverly Schwartz returns for follow-up and treatment as scheduled.  Beverly Schwartz completed cycle 8 Capox and Avastin on 11/11/2018. Beverly Schwartz is doing well. Beverly Schwartz has fatigue but remains functional. Beverly Schwartz is eating and drinking well. Denies mucositis. Intermittent mild nausea is controlled with home meds. Takes senna and miralax for constipation but doesn't help much. Beverly Schwartz strained and had some bright blood in stool, Beverly Schwartz attributes to hemorrhoids. Denies redness to hands but feet are slightly red and sore. Also has more constant neuropathy in toes and intermittent in fingertips. Beverly Schwartz remains functional. No balance issues. Has occasional abdominal pain but does not require medication. Beverly Schwartz has intermittent dry cough. Denies fever, chills, chest pain, dyspnea, leg edema. Beverly Schwartz forgot to to take amlodipine today.    MEDICAL HISTORY:  Past Medical History:  Diagnosis Date   Anxiety    Cancer ([place])    colon, liver   Chest pain 10/18/2018   Patient c/o chest pain right side yesterday intermitten lasting 3-4 hours.  [name], PA notified   Colon cancer [facility])    Complication of anesthesia    Depression    Diabetes mellitus      Dizziness    Family history of breast cancer    Family history of stomach cancer    Hyperlipidemia    Hypertension    Hypothyroidism    Obesity    PONV (postoperative nausea and vomiting)    Tachycardia     SURGICAL HISTORY: Past Surgical History:  Procedure Laterality Date   FLEXIBLE SIGMOIDOSCOPY N/A 05/31/2017   Procedure: FLEXIBLE SIGMOIDOSCOPY;  Surgeon: Clarene Essex, MD;  Location: [facility] ENDOSCOPY;  Service: Endoscopy;  Laterality: N/A;   IR IMAGING GUIDED PORT INSERTION  10/19/2018   WISDOM TOOTH EXTRACTION     age 48's    I have reviewed the social history and family history with the patient and they are unchanged from previous note.  ALLERGIES:  is allergic to bupropion and amoxicillin.  MEDICATIONS:  Current Outpatient Medications  Medication Sig Dispense Refill   acetaminophen-codeine (TYLENOL #3) 300-30 MG tablet Take 1 tablet by mouth every 6 (six) hours as needed for moderate pain. 30 tablet 0   albuterol (PROVENTIL HFA;VENTOLIN HFA) 108 (90 Base) MCG/ACT inhaler Inhale 2 puffs into the lungs every 6 (six) hours as needed for wheezing or shortness of breath. 1 Inhaler 0   ALPRAZolam (XANAX) 1 MG tablet TAKE 1 TABLET BY MOUTH AT BEDTIME AS NEEDED FOR ANXIETY 30 tablet 0   amLODipine (NORVASC) 10 MG tablet Take 1 tablet (10 mg total) by mouth daily. 90 tablet 0   benzonatate (TESSALON) 100 MG capsule Take 1 capsule (100 mg total) by mouth 3 (  three) times daily as needed for cough. 20 capsule 0   capecitabine (XELODA) 500 MG tablet TAKE 3 TABLETS (1,500 MG TOTAL) BY MOUTH 2 (TWO) TIMES DAILY AFTER A MEAL. TAKE FOR 14 DAYS ON, 7 DAYS OFF, REPEAT EVERY 21 DAYS. 84 tablet 1   cetirizine (ZYRTEC ALLERGY) 10 MG tablet Take 1 tablet (10 mg total) by mouth daily. 30 tablet 1   CINNAMON PO Take 1 tablet by mouth daily.      clindamycin (CLINDAGEL) 1 % gel APPLY TO AFFECTED AREA TWICE A DAY 60 g 1   fluticasone (FLONASE) 50 MCG/ACT nasal spray Place 1 spray  into both nostrils daily. (Patient taking differently: Place 1 spray into both nostrils daily as needed for allergies. ) 16 g 2   insulin NPH-regular Human (70-30) 100 UNIT/ML injection 17 Units with breakfast, 10 Units with dinner 10 mL 2   Insulin Syringe-Needle U-100 (BD INSULIN SYRINGE U/F) 31G X 5/16" 1 ML MISC Inject twice a day as directed 200 each 3   levothyroxine (SYNTHROID, LEVOTHROID) 112 MCG tablet Take 1 tablet (112 mcg total) by mouth daily before breakfast. 90 tablet 3   lidocaine-prilocaine (EMLA) cream APPLY 1 APPLICATION TOPICALLY AS NEEDED. 30 g 0   lisinopril (ZESTRIL) 5 MG tablet Take 1 tablet (5 mg total) by mouth daily. PATIENT MUST HAVE OFFICE VISIT PRIOR TO ANY FURTHER REFILLS 30 tablet 0   mirtazapine (REMERON) 7.5 MG tablet TAKE 1 TABLET (7.5 MG TOTAL) BY MOUTH AT BEDTIME. 90 tablet 1   Omega-3 Fatty Acids (FISH OIL) 1000 MG CAPS Take 1,000 mg by mouth daily.      polyethylene glycol (MIRALAX / GLYCOLAX) packet Take 17 g by mouth daily. 14 each 0   prochlorperazine (COMPAZINE) 10 MG tablet Take 1 tablet (10 mg total) by mouth every 6 (six) hours as needed (NAUSEA). 30 tablet 1   TURMERIC PO Take 1 capsule by mouth daily.      No current facility-administered medications for this visit.    Facility-Administered Medications Ordered in Other Visits  Medication Dose Route Frequency Provider Last Rate Last Dose   0.9 %  sodium chloride infusion   Intravenous Continuous Truitt Merle, MD   Stopped at 11/11/18 1330   heparin lock flush 100 unit/mL  500 Units Intracatheter Once PRN Truitt Merle, MD       oxaliplatin (ELOXATIN) 140 mg in dextrose 5 % 500 mL chemo infusion  75 mg/m2 (Treatment Plan Recorded) Intravenous Once Truitt Merle, MD       sodium chloride flush (NS) 0.9 % injection 10 mL  10 mL Intracatheter PRN Truitt Merle, MD   10 mL at 11/11/18 1548   sodium chloride flush (NS) 0.9 % injection 10 mL  10 mL Intracatheter PRN Truitt Merle, MD        PHYSICAL  EXAMINATION: ECOG PERFORMANCE STATUS: 1 - Symptomatic but completely ambulatory  Vitals:   12/02/18 1130 12/02/18 1140  BP: (!) 154/107 (!) 143/106  Pulse: 92   Resp: 18   Temp: 98.7 F (37.1 C)   SpO2: 100%    Filed Weights   12/02/18 1130  Weight: 164 lb 9.6 oz (74.7 kg)    GENERAL:alert, no distress and comfortable SKIN: no rash. Palms without erythema. Mild erythema to feet, no blisters or skin breakdown  EYES: sclera clear OROPHARYNX: no thrush or ulcers LUNGS: clear with normal breathing effort HEART: regular rate & rhythm, no lower extremity edema ABDOMEN:abdomen soft, non-tender and normal bowel sounds Musculoskeletal:no  cyanosis of digits NEURO: alert & oriented x 3 with fluent speech, peripheral vibratory sense intact per tuning fork exam PAC without erythema   LABORATORY DATA:  I have reviewed the data as listed CBC Latest Ref Rng & Units 12/02/2018 11/11/2018 10/21/2018  WBC 4.0 - 10.5 K/uL 6.8 7.4 6.9  Hemoglobin 12.0 - 15.0 g/dL 12.9 13.2 14.1  Hematocrit 36.0 - 46.0 % 39.1 40.3 44.0  Platelets 150 - 400 K/uL 166 207 185     CMP Latest Ref Rng & Units 12/02/2018 11/11/2018 10/21/2018  Glucose 70 - 99 mg/dL 127(H) 181(H) 194(H)  BUN 6 - 20 mg/dL '8 10 9  '$ Creatinine 0.44 - 1.00 mg/dL 0.78 0.81 0.85  Sodium 135 - 145 mmol/L 142 140 139  Potassium 3.5 - 5.1 mmol/L 4.1 4.0 4.0  Chloride 98 - 111 mmol/L 108 106 104  CO2 22 - 32 mmol/L 25 21(L) 26  Calcium 8.9 - 10.3 mg/dL 8.2(L) 8.3(L) 8.4(L)  Total Protein 6.5 - 8.1 g/dL 6.9 6.9 7.6  Total Bilirubin 0.3 - 1.2 mg/dL 0.7 0.5 0.4  Alkaline Phos 38 - 126 U/L 147(H) 129(H) 150(H)  AST 15 - 41 U/L '21 18 21  '$ ALT 0 - 44 U/L '10 11 16      '$ RADIOGRAPHIC STUDIES: I have personally reviewed the radiological images as listed and agreed with the findings in the report. No results found.   ASSESSMENT & PLAN: Beverly Schwartz is a 48 y.o. female with    1. Sigmoid adenocarcinoma, with abdominopelvic adenopathy  andhepatic metastasis, stage IV, MSI-stable , KRAS/NRAS/BRAF wild type -Diagnosed in 05/2017. Treated withfirst-line chemotherapyXelodaandVectibixand tolerated well. Patient declined intensive chemo regimens initially due to fear of side effects.Unfortunatelyshe had mild disease progressionafter 8 months of therapy. -Beverly Schwartz is currently on second lineCAPOX and Avastin, Beverly Schwartz did not want infusion pump at this point.  -Beverly Schwartz started CAPOXand Avastinon 05/17/18. Shetolerated thefirst cycle moderately well with fatigue, nausea, and muscle cramps in Beverly Schwartz handsfor 3 days, recovered well. -S/p 4 cycles Beverly Schwartz developed more neuropathy lately, mainly tingling and primarily the first week of cycle.Oxaliplatin reduced to'75mg'$ /m2every 3 weeks -Based on CT CAP from 09/09/18 Beverly Schwartz hasat least3 known liver lesions which has decreased significantly since chemo, largest 2.7cm now.  -Beverly Schwartz had PAC placed on 10/19/18 -Beverly Schwartz tolerated dose reduction much better.  -Beverly Schwartz appears stable. Beverly Schwartz completed 8 cycles Capox. Beverly Schwartz continues to have mild to moderate hand-foot syndrome, mainly to Beverly Schwartz feet. I recommend Beverly Schwartz apply hydrocortisone and urea cream. Beverly Schwartz agrees -Beverly Schwartz has more neuropathy in hands and feet after cycle 8. No sensory deficits or functional difficulties. Will watch closely.  -for constipation, I recommend to increase miralax BID or use mag citrate PRN -Labs reviewed, adequate for treatment. Beverly Schwartz will proceed with cycle 9 CAPOX today without dosage adjustment. Due to severe HTN, I will hold Avastin today -Beverly Schwartz will undergo restaging CT after this cycle -f/u in 3 weeks to review CT and treatment   2. DM, HTN, HL -f/u with PCP Mina Marble -not controlled on propranolol -Beverly Schwartz started amlodipine which has not been effective, Beverly Schwartz has not increased dose despite multiple instruction to increase to 10 mg daily -Beverly Schwartz PCP recently started Beverly Schwartz on lisinopril'5mg'$ on 08/18/18. -multiple repeat BP today as high as  178/109, Beverly Schwartz did not take amlodipine today. I recommend Beverly Schwartz take 10 mg daily, Beverly Schwartz agrees   3. Financial and Social issues, Anxiety, depression -Shefollows up as needed withSocial Worker Hollice Espy -Beverly Schwartz is currently on Xanax  -Shehas regained  insurancewithBlue cross insurancein 2020  4.Goal of care discussion  -Beverly Schwartz understands Beverly Schwartz cancer is metastatic, but still very hopeful it will be cured -full code   5. Hand-foot syndrome, secondary to Xeloda  -Beverly Schwartz developed mild hand-foot syndrome, secondary to Xeloda.Improved with a dose reduction of Xeloda. 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  -This has been intermittent, oxaliplatin dose reduced starting with cycle 6 and again with cycle 7.  -neuropathy is more persistent now, no functional limits or sensory deficits on exam. Will monitor closely.    PLAN -Labs reviewed -proceed with cycle 9 CAPOX, no dosage adjustments -repeat BP 178/109, hold Avastin -increase amlodipine to 10 mg daily, continue lisinopril. Refilled both today  -Hydrocortisone and urea hand-foot syndrome  -Increase miralax to BID, or consider mag citrate PRN for constipation -Restage CT on 12/20/18 -f/u and treatment in 3 weeks   All questions were answered. The patient knows to call the clinic with any problems, questions or concerns. No barriers to learning was detected. I spent 20 minutes counseling the patient face to face. The total time spent in the appointment was 25 minutes and more than 50% was on counseling and review of test results     Alla Feeling, NP 12/02/18

## 2018-12-02 NOTE — Progress Notes (Signed)
Per Cira Rue, NP no avastin today due to blood pressure. Only to receive oxaliplatin.

## 2018-12-02 NOTE — Telephone Encounter (Signed)
Scheduled appt per 9/10 los. °

## 2018-12-03 ENCOUNTER — Telehealth: Payer: Self-pay

## 2018-12-03 NOTE — Telephone Encounter (Signed)
TC to pt per Cira Rue NP to let her know  CEA is stable from last month. She has scan coming up on 9/28. Pt verbalized understanding and stated that she is aware of appointment.

## 2018-12-06 ENCOUNTER — Other Ambulatory Visit: Payer: Self-pay | Admitting: Adult Health

## 2018-12-12 NOTE — Progress Notes (Signed)
Subjective:    Patient ID: Beverly Schwartz, female    DOB: Dec 28, 1970, 48 y.o.   MRN: [id number]  HPI:08/18/2018 OV: Beverly Schwartz presents today for regular f/u: HTN, T2D, Anxiety. 07/27/2018 TeleMedicine appt- lengthy discussion on how to take Novolin 70/30- Novolin 70/30- 15 U QAM, 8 U QPM Check fasting and premeal BG She reports AM BG 90-120s, denies episodes of hypoglycemia. A1c today-8 will increase Insulin- 15 U with breakfast and 10 U with dinner- continue check BG daily- record daily She reports home BP readings SBP120-170s DBP 80-110s She reports mood stable, denies SI/HI She recently had appt with a chaplain for spiritual care on 08/09/2018- addressed home/living situation, challenges of home schooling her 58 year old son, and COVID-19 pandemic She recently sold her home in [place] and has placed offer on "[facility]" that she intends to place on her mothers land- which will relieve a lot of financial stress. She continues very close follow-up with her Oncology team She states "I have a spot on my foot that I need you to look at"- she is unsure how long the spot has been her foot.  12/13/2018 OV: Beverly Schwartz is here for regular f/u: HTN, T2D, GAD Her BP continues to be uncontrolled- oncology has been titrating CCB She is currently on Amlodipine 10mg  QD, Lisinopril 5mg  QD Ambulatory BP SBP 120-160, mean 130 DBP 80-110s, mean 90 She reports 75% consistency of taking anti-hypertensives She denies CP/chest tightness with exertion  She reports AM BG 60-130s She has been taking Novolin 70/30- 20 U at night, not  15 U with breakfast and 10 U with dinner Discussed at length proper dosing and risks associated with hypoglycemia  Lab Results  Component Value Date   HGBA1C 7.0 (A) 12/13/2018   HGBA1C 8.0 (A) 08/18/2018   HGBA1C 9.0 (H) 05/31/2017   She continues to experience anxiety, estimates to use Alprazolam 1mg  - 1/2 tab 3-4 times QHS She would like to be evaluated for ADHD and  also discuss daily medication (not just PRN) for GAD- agreeable to Psychiatry referral.  She has tender callus on L foot, states "I almost dug it out myself", agreeable to referral to Podiatry.  She has skin tags and multiple atypical nevi- agreeable to referral to Dermatology.  She will moving onto family land to live in [place] this fall and she is quite excited. He son (8th grade) has opted for the the virtual school for entire 2020-2021 year and has been doing well in the on-line format.  She has been unable to work since her Cancer Dx and has been denies twice for disability.  Her mother has been providing all her financial support, referral placed for Social Worker   Patient Care Team    Relationship Specialty Notifications Start End  [place], Valetta Fuller D, NP PCP - General Family Medicine  07/07/16   Delrae Rend, MD Consulting Physician Endocrinology  07/07/16   Truitt Merle, [place] Physician Hematology  06/05/17   Alla Feeling, NP Nurse Practitioner Nurse Practitioner  06/05/17   Clarene Essex, MD Consulting Physician Gastroenterology  06/05/17     Patient Active Problem List   Diagnosis Date Noted  . Skin tag 12/13/2018  . ADHD (attention deficit hyperactivity disorder) evaluation 12/13/2018  . Foot callus 12/13/2018  . Financial difficulties 12/13/2018  . Headache 07/23/2018  . Excessive cerumen in both ear canals 05/05/2018  . Acute respiratory infection 03/30/2018  . Genetic testing 08/10/2017  . Family history of breast  cancer   . Family history of stomach cancer   . Goals of care, counseling/discussion 06/15/2017  . 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 SIRS (systemic inflammatory response syndrome) ([place]) 05/30/2017  . Healthcare maintenance 12/10/2016  . Elevated LFTs 12/10/2016  . Generalized abdominal pain 09/30/2016  . Anxiety 09/30/2016  . Sinusitis 09/03/2016  . Diabetes mellitus without complication ([place]) [id number]  .  Hypothyroid 07/07/2016  . Hypertension 07/07/2016  . Hyperlipidemia 08/06/2010  . Tachycardia   . Chest pain   . Dizziness   . Obesity      Past Medical History:  Diagnosis Date  . Anxiety   . Cancer ([place])    colon, liver  . Chest pain 10/18/2018   Patient c/o chest pain right side yesterday intermitten lasting 3-4 hours.  [name], PA notified  . Colon cancer ([place])   . Complication of anesthesia   . Depression   . Diabetes mellitus   . Dizziness   . Family history of breast cancer   . Family history of stomach cancer   . Hyperlipidemia   . Hypertension   . Hypothyroidism   . Obesity   . PONV (postoperative nausea and vomiting)   . Tachycardia      Past Surgical History:  Procedure Laterality Date  . FLEXIBLE SIGMOIDOSCOPY N/A 05/31/2017   Procedure: FLEXIBLE SIGMOIDOSCOPY;  Surgeon: Clarene Essex, MD;  Location: [facility];  Service: Endoscopy;  Laterality: N/A;  . IR IMAGING GUIDED PORT INSERTION  10/19/2018  . WISDOM TOOTH EXTRACTION     age 54's     Family History  Problem Relation Age of Onset  . Heart disease Father   . Healthy Mother   . Hyperlipidemia Sister   . Healthy Brother   . Heart disease Paternal Uncle   . Heart attack Paternal Uncle   . Healthy Son   . Breast cancer Maternal Grandmother        d. in mid 7s  . Stomach cancer Paternal Grandfather        d. 79  . Colon cancer Neg Hx   . Esophageal cancer Neg Hx   . Rectal cancer Neg Hx   . Liver cancer Neg Hx      Social History   Substance and Sexual Activity  Drug Use No     Social History   Substance and Sexual Activity  Alcohol Use No     Social History   Tobacco Use  Smoking Status Never Smoker  Smokeless Tobacco Never Used     Outpatient Encounter Medications as of 12/13/2018  Medication Sig  . acetaminophen-codeine (TYLENOL #3) 300-30 MG tablet Take 1 tablet by mouth every 6 (six) hours as needed for moderate pain.  Marland Kitchen albuterol (PROVENTIL HFA;VENTOLIN HFA)  108 (90 Base) MCG/ACT inhaler Inhale 2 puffs into the lungs every 6 (six) hours as needed for wheezing or shortness of breath.  . ALPRAZolam (XANAX) 1 MG tablet TAKE 1 TABLET BY MOUTH AT BEDTIME AS NEEDED FOR ANXIETY  . amLODipine (NORVASC) 10 MG tablet Take 1 tablet (10 mg total) by mouth daily.  . benzonatate (TESSALON) 100 MG capsule Take 1 capsule (100 mg total) by mouth 3 (three) times daily as needed for cough.  . capecitabine (XELODA) 500 MG tablet TAKE 3 TABLETS (1,500 MG TOTAL) BY MOUTH 2 (TWO) TIMES DAILY AFTER A MEAL. TAKE FOR 14 DAYS ON, 7 DAYS OFF, REPEAT EVERY 21 DAYS.  Marland Kitchen cetirizine (ZYRTEC ALLERGY) 10 MG tablet Take 1 tablet (  10 mg total) by mouth daily.  Marland Kitchen CINNAMON PO Take 1 tablet by mouth daily.   . clindamycin (CLINDAGEL) 1 % gel APPLY TO AFFECTED AREA TWICE A DAY  . fluticasone (FLONASE) 50 MCG/ACT nasal spray Place 1 spray into both nostrils daily. (Patient taking differently: Place 1 spray into both nostrils daily as needed for allergies. )  . insulin NPH-regular Human (NOVOLIN 70/30) (70-30) 100 UNIT/ML injection INJECT 10 UNITS WITH BREAKFAST, 10 UNITS WITH DINNER  . Insulin Syringe-Needle U-100 (BD INSULIN SYRINGE U/F) 31G X 5/16" 1 ML MISC Inject twice a day as directed  . levothyroxine (SYNTHROID, LEVOTHROID) 112 MCG tablet Take 1 tablet (112 mcg total) by mouth daily before breakfast.  . lidocaine-prilocaine (EMLA) cream APPLY 1 APPLICATION TOPICALLY AS NEEDED.  Marland Kitchen lisinopril (ZESTRIL) 5 MG tablet Take 1 tablet (5 mg total) by mouth daily. PATIENT MUST HAVE OFFICE VISIT PRIOR TO ANY FURTHER REFILLS  . mirtazapine (REMERON) 7.5 MG tablet TAKE 1 TABLET (7.5 MG TOTAL) BY MOUTH AT BEDTIME.  Marland Kitchen Omega-3 Fatty Acids (FISH OIL) 1000 MG CAPS Take 1,000 mg by mouth daily.   . polyethylene glycol (MIRALAX / GLYCOLAX) packet Take 17 g by mouth daily.  . prochlorperazine (COMPAZINE) 10 MG tablet Take 1 tablet (10 mg total) by mouth every 6 (six) hours as needed (NAUSEA).  . TURMERIC PO  Take 1 capsule by mouth daily.   . [DISCONTINUED] insulin NPH-regular Human (NOVOLIN 70/30) (70-30) 100 UNIT/ML injection INJECT 15 UNITS WITH BREAKFAST, 8 UNITS WITH DINNER   Facility-Administered Encounter Medications as of 12/13/2018  Medication  . 0.9 %  sodium chloride infusion  . sodium chloride flush (NS) 0.9 % injection 10 mL    Allergies: Bupropion and Amoxicillin  Body mass index is 27.07 kg/m.  Blood pressure 125/80, pulse 91, temperature 98.6 F (37 C), temperature source Oral, height 5\' 5"  (1.651 m), weight 162 lb 11.2 oz (73.8 kg), SpO2 100 %.  Review of Systems  Constitutional: Positive for fatigue. Negative for activity change, appetite change, chills, diaphoresis, fever and unexpected weight change.  HENT: Negative for congestion.   Eyes: Negative for visual disturbance.  Respiratory: Positive for cough. Negative for chest tightness, shortness of breath, wheezing and stridor.   Cardiovascular: Negative for chest pain, palpitations and leg swelling.  Gastrointestinal: Negative for abdominal distention, anal bleeding, blood in stool, constipation, diarrhea, nausea and vomiting.  Endocrine: Negative for cold intolerance, heat intolerance, polydipsia, polyphagia and polyuria.  Genitourinary: Negative for difficulty urinating and flank pain.  Musculoskeletal: Negative for arthralgias, back pain, gait problem, joint swelling, myalgias, neck pain and neck stiffness.  Skin: Negative for color change, pallor, rash and wound.  Neurological: Negative for headaches.  Hematological: Negative for adenopathy. Does not bruise/bleed easily.  Psychiatric/Behavioral: Positive for decreased concentration, dysphoric mood and sleep disturbance. Negative for agitation, behavioral problems, confusion, hallucinations, self-injury and suicidal ideas. The patient is nervous/anxious and is hyperactive.        Objective:   Physical Exam Vitals signs and nursing note reviewed.   Constitutional:      General: She is not in acute distress.    Appearance: Normal appearance. She is normal weight. She is not ill-appearing, toxic-appearing or diaphoretic.  HENT:     Head: Normocephalic and atraumatic.  Cardiovascular:     Rate and Rhythm: Normal rate and regular rhythm.  Skin:    General: Skin is warm and dry.     Capillary Refill: Capillary refill takes less than 2 seconds.  Comments: L foot- callus No open tissue noted  L upper hip- large skin tag  Neurological:     Mental Status: She is alert and oriented to person, place, and time.  Psychiatric:        Attention and Perception: Attention and perception normal.        Mood and Affect: Mood and affect normal.        Speech: Speech is rapid and pressured.        Behavior: Behavior normal.        Thought Content: Thought content normal.        Cognition and Memory: Cognition and memory normal.        Judgment: Judgment normal.        Assessment & Plan:   1. Diabetes mellitus without complication ([place])   2. Hyperlipidemia, unspecified hyperlipidemia type   3. Foot callus   4. ADHD (attention deficit hyperactivity disorder) evaluation   5. GAD (generalized anxiety disorder)   6. Skin tag   7. Healthcare maintenance   8. Malignant neoplasm of rectosigmoid junction ([facility])   9. Essential hypertension   10. Anxiety   11. Financial difficulties     Healthcare maintenance Blood pressure and A1c (7.0)- greatly improved. Ensure that you take Amlodipine 10mg  and Lisinopril 5mg  EVERY day. Please take Novolin 70/30 10 Units with breakfast and dinner daily. Continue to check fasting blood sugar daily, call clinic if you experience any readings <90 or consistently >140. Various referrals placed. We will call you with lab results. Follow-up 3 months, physical. Continue close follow-up with Oncology. Emmit Alexanders with the move! Continue to social distance and wear a mask when in public.  Hyperlipidemia Lipid  panel drawn today  Diabetes mellitus without complication ([place]) Lab Results  Component Value Date   HGBA1C 7.0 (A) 12/13/2018   HGBA1C 8.0 (A) 08/18/2018   HGBA1C 9.0 (H) 05/31/2017  She reports AM BG 60-130s She has been taking Novolin 70/30- 20 U at night, not  15 U with breakfast and 10 U with dinner Discussed at length proper dosing and risks associated with hypoglycemia  Hypertension She is currently on Amlodipine 10mg  QD, Lisinopril 5mg  QD Ambulatory BP SBP 120-160, mean 130 DBP 80-110s, mean 90 She reports 75% consistency of taking anti-hypertensives She denies CP/chest tightness with exertion Lengthy discussion on taking all medications DAILY   Skin tag Derm referral   Anxiety Psychiatry referral   ADHD (attention deficit hyperactivity disorder) evaluation Psychiatry referral   Foot callus Advised not to remove callus' herself, re: Diabetes Referral to Podiatry placed   Financial difficulties Social Work Referral     FOLLOW-UP:  Return in about 3 months (around 03/14/2019) for Regular Follow Up, HTN, Hypercholestermia, Diabetes, General Anxiety Disorder.

## 2018-12-13 ENCOUNTER — Encounter: Payer: Self-pay | Admitting: Adult Health

## 2018-12-13 ENCOUNTER — Ambulatory Visit (INDEPENDENT_AMBULATORY_CARE_PROVIDER_SITE_OTHER): Payer: BC Managed Care – PPO | Admitting: Adult Health

## 2018-12-13 ENCOUNTER — Other Ambulatory Visit: Payer: Self-pay

## 2018-12-13 VITALS — BP 125/80 | HR 91 | Temp 98.6°F | Ht 65.0 in | Wt 162.7 lb

## 2018-12-13 DIAGNOSIS — E119 Type 2 diabetes mellitus without complications: Secondary | ICD-10-CM

## 2018-12-13 DIAGNOSIS — L84 Corns and callosities: Secondary | ICD-10-CM | POA: Diagnosis not present

## 2018-12-13 DIAGNOSIS — C19 Malignant neoplasm of rectosigmoid junction: Secondary | ICD-10-CM

## 2018-12-13 DIAGNOSIS — Z1389 Encounter for screening for other disorder: Secondary | ICD-10-CM | POA: Diagnosis not present

## 2018-12-13 DIAGNOSIS — Z598 Other problems related to housing and economic circumstances: Secondary | ICD-10-CM

## 2018-12-13 DIAGNOSIS — E785 Hyperlipidemia, unspecified: Secondary | ICD-10-CM

## 2018-12-13 DIAGNOSIS — Z0000 Encounter for general adult medical examination without abnormal findings: Secondary | ICD-10-CM

## 2018-12-13 DIAGNOSIS — F411 Generalized anxiety disorder: Secondary | ICD-10-CM

## 2018-12-13 DIAGNOSIS — I10 Essential (primary) hypertension: Secondary | ICD-10-CM

## 2018-12-13 DIAGNOSIS — L918 Other hypertrophic disorders of the skin: Secondary | ICD-10-CM | POA: Insufficient documentation

## 2018-12-13 DIAGNOSIS — Z599 Problem related to housing and economic circumstances, unspecified: Secondary | ICD-10-CM | POA: Insufficient documentation

## 2018-12-13 DIAGNOSIS — Z1339 Encounter for screening examination for other mental health and behavioral disorders: Secondary | ICD-10-CM | POA: Insufficient documentation

## 2018-12-13 DIAGNOSIS — F419 Anxiety disorder, unspecified: Secondary | ICD-10-CM

## 2018-12-13 LAB — POCT GLYCOSYLATED HEMOGLOBIN (HGB A1C): Hemoglobin A1C: 7 % — AB (ref 4.0–5.6)

## 2018-12-13 MED ORDER — NOVOLIN 70/30 (70-30) 100 UNIT/ML ~~LOC~~ SUSP: SUBCUTANEOUS | 2 refills | Status: AC

## 2018-12-13 NOTE — Assessment & Plan Note (Signed)
Psychiatry referral

## 2018-12-13 NOTE — Assessment & Plan Note (Signed)
Blood pressure and A1c (7.0)- greatly improved. Ensure that you take Amlodipine 10mg  and Lisinopril 5mg  EVERY day. Please take Novolin 70/30 10 Units with breakfast and dinner daily. Continue to check fasting blood sugar daily, call clinic if you experience any readings <90 or consistently >140. Various referrals placed. We will call you with lab results. Follow-up 3 months, physical. Continue close follow-up with Oncology. Emmit Alexanders with the move! Continue to social distance and wear a mask when in public.

## 2018-12-13 NOTE — Assessment & Plan Note (Signed)
Social Work Referral

## 2018-12-13 NOTE — Assessment & Plan Note (Signed)
Lab Results  Component Value Date   HGBA1C 7.0 (A) 12/13/2018   HGBA1C 8.0 (A) 08/18/2018   HGBA1C 9.0 (H) 05/31/2017  She reports AM BG 60-130s She has been taking Novolin 70/30- 20 U at night, not  15 U with breakfast and 10 U with dinner Discussed at length proper dosing and risks associated with hypoglycemia

## 2018-12-13 NOTE — Assessment & Plan Note (Signed)
Lipid panel drawn today 

## 2018-12-13 NOTE — Patient Outreach (Signed)
[facility]) Care Management  12/13/2018  TESSAH CORALES 1970/07/29 [id number]   Referral Date: 12/13/2018 Referral Source: MD referral Referral Reason: Help with disability and financial help with medications   Outreach Attempt: No answer.  Unable to leave a message.  Voice mail full.    Plan: RN CM will attempt patient again within 4 business days and send letter.     Jone Baseman, RN, MSN [facility] Care Management Care Management Coordinator Direct Line [phone] Toll Free: [phone]  Fax: [phone]

## 2018-12-13 NOTE — Assessment & Plan Note (Signed)
Derm referral.

## 2018-12-13 NOTE — Assessment & Plan Note (Signed)
She is currently on Amlodipine 10mg  QD, Lisinopril 5mg  QD Ambulatory BP SBP 120-160, mean 130 DBP 80-110s, mean 90 She reports 75% consistency of taking anti-hypertensives She denies CP/chest tightness with exertion Lengthy discussion on taking all medications DAILY

## 2018-12-13 NOTE — Patient Instructions (Addendum)
Managing Your Hypertension Hypertension is commonly called high blood pressure. This is when the force of your blood pressing against the walls of your arteries is too strong. Arteries are blood vessels that carry blood from your heart throughout your body. Hypertension forces the heart to work harder to pump blood, and may cause the arteries to become narrow or stiff. Having untreated or uncontrolled hypertension can cause heart attack, stroke, kidney disease, and other problems. What are blood pressure readings? A blood pressure reading consists of a higher number over a lower number. Ideally, your blood pressure should be below 120/80. The first ("top") number is called the systolic pressure. It is a measure of the pressure in your arteries as your heart beats. The second ("bottom") number is called the diastolic pressure. It is a measure of the pressure in your arteries as the heart relaxes. What does my blood pressure reading mean? Blood pressure is classified into four stages. Based on your blood pressure reading, your health care provider may use the following stages to determine what type of treatment you need, if any. Systolic pressure and diastolic pressure are measured in a unit called mm Hg. Normal  Systolic pressure: below 784.  Diastolic pressure: below 80. Elevated  Systolic pressure: 696-295.  Diastolic pressure: below 80. Hypertension stage 1  Systolic pressure: 284-132.  Diastolic pressure: 44-01. Hypertension stage 2  Systolic pressure: 027 or above.  Diastolic pressure: 90 or above. What health risks are associated with hypertension? Managing your hypertension is an important responsibility. Uncontrolled hypertension can lead to:  A heart attack.  A stroke.  A weakened blood vessel (aneurysm).  Heart failure.  Kidney damage.  Eye damage.  Metabolic syndrome.  Memory and concentration problems. What changes can I make to manage my hypertension?  Hypertension can be managed by making lifestyle changes and possibly by taking medicines. Your health care provider will help you make a plan to bring your blood pressure within a normal range. Eating and drinking   Eat a diet that is high in fiber and potassium, and low in salt (sodium), added sugar, and fat. An example eating plan is called the DASH (Dietary Approaches to Stop Hypertension) diet. To eat this way: ? Eat plenty of fresh fruits and vegetables. Try to fill half of your plate at each meal with fruits and vegetables. ? Eat whole grains, such as whole wheat pasta, brown rice, or whole grain bread. Fill about one quarter of your plate with whole grains. ? Eat low-fat diary products. ? Avoid fatty cuts of meat, processed or cured meats, and poultry with skin. Fill about one quarter of your plate with lean proteins such as fish, chicken without skin, beans, eggs, and tofu. ? Avoid premade and processed foods. These tend to be higher in sodium, added sugar, and fat.  Reduce your daily sodium intake. Most people with hypertension should eat less than 1,500 mg of sodium a day.  Limit alcohol intake to no more than 1 drink a day for nonpregnant women and 2 drinks a day for men. One drink equals 12 oz of beer, 5 oz of wine, or 1 oz of hard liquor. Lifestyle  Work with your health care provider to maintain a healthy body weight, or to lose weight. Ask what an ideal weight is for you.  Get at least 30 minutes of exercise that causes your heart to beat faster (aerobic exercise) most days of the week. Activities may include walking, swimming, or biking.  Include exercise  to strengthen your muscles (resistance exercise), such as weight lifting, as part of your weekly exercise routine. Try to do these types of exercises for 30 minutes at least 3 days a week.  Do not use any products that contain nicotine or tobacco, such as cigarettes and e-cigarettes. If you need help quitting, ask your health  care provider.  Control any long-term (chronic) conditions you have, such as high cholesterol or diabetes. Monitoring  Monitor your blood pressure at home as told by your health care provider. Your personal target blood pressure may vary depending on your medical conditions, your age, and other factors.  Have your blood pressure checked regularly, as often as told by your health care provider. Working with your health care provider  Review all the medicines you take with your health care provider because there may be side effects or interactions.  Talk with your health care provider about your diet, exercise habits, and other lifestyle factors that may be contributing to hypertension.  Visit your health care provider regularly. Your health care provider can help you create and adjust your plan for managing hypertension. Will I need medicine to control my blood pressure? Your health care provider may prescribe medicine if lifestyle changes are not enough to get your blood pressure under control, and if:  Your systolic blood pressure is 130 or higher.  Your diastolic blood pressure is 80 or higher. Take medicines only as told by your health care provider. Follow the directions carefully. Blood pressure medicines must be taken as prescribed. The medicine does not work as well when you skip doses. Skipping doses also puts you at risk for problems. Contact a health care provider if:  You think you are having a reaction to medicines you have taken.  You have repeated (recurrent) headaches.  You feel dizzy.  You have swelling in your ankles.  You have trouble with your vision. Get help right away if:  You develop a severe headache or confusion.  You have unusual weakness or numbness, or you feel faint.  You have severe pain in your chest or abdomen.  You vomit repeatedly.  You have trouble breathing. Summary  Hypertension is when the force of blood pumping through your arteries  is too strong. If this condition is not controlled, it may put you at risk for serious complications.  Your personal target blood pressure may vary depending on your medical conditions, your age, and other factors. For most people, a normal blood pressure is less than 120/80.  Hypertension is managed by lifestyle changes, medicines, or both. Lifestyle changes include weight loss, eating a healthy, low-sodium diet, exercising more, and limiting alcohol. This information is not intended to replace advice given to you by your health care provider. Make sure you discuss any questions you have with your health care provider. Document Released: 12/03/2011 Document Revised: 07/02/2018 Document Reviewed: 02/06/2016 Elsevier Patient Education  [place].   Blood pressure and A1c (7.0)- greatly improved. Ensure that you take Amlodipine 10mg  and Lisinopril 5mg  EVERY day. Please take Novolin 70/30 10 Units with breakfast and dinner daily. Continue to check fasting blood sugar daily, call clinic if you experience any readings <90 or consistently >140. Various referrals placed. We will call you with lab results. Follow-up 3 months, physical. Continue close follow-up with Oncology. Beverly Schwartz with the move! Continue to social distance and wear a mask when in public. GREAT TO SEE YOU!

## 2018-12-13 NOTE — Assessment & Plan Note (Signed)
Advised not to remove callus' herself, re: Diabetes Referral to Podiatry placed

## 2018-12-14 ENCOUNTER — Other Ambulatory Visit: Payer: Self-pay | Admitting: Nurse Practitioner

## 2018-12-14 ENCOUNTER — Encounter: Payer: Self-pay | Admitting: Adult Health

## 2018-12-14 DIAGNOSIS — C19 Malignant neoplasm of rectosigmoid junction: Secondary | ICD-10-CM

## 2018-12-14 LAB — LIPID PANEL
Chol/HDL Ratio: 4.1 ratio (ref 0.0–4.4)
Cholesterol, Total: 267 mg/dL — ABNORMAL HIGH (ref 100–199)
HDL: 65 mg/dL (ref >39)
LDL Chol Calc (NIH): 159 mg/dL — ABNORMAL HIGH (ref 0–99)
Triglycerides: 238 mg/dL — ABNORMAL HIGH (ref 0–149)
VLDL Cholesterol Cal: 43 mg/dL — ABNORMAL HIGH (ref 5–40)

## 2018-12-14 MED ORDER — ACETAMINOPHEN-CODEINE #3 300-30 MG PO TABS: 1.0000 | ORAL_TABLET | Freq: Four times a day (QID) | ORAL | 0 refills | Status: DC | PRN

## 2018-12-14 NOTE — Telephone Encounter (Signed)
Requests refill. Last refill 12/03/18 for # 30 tabs

## 2018-12-15 ENCOUNTER — Other Ambulatory Visit: Payer: Self-pay

## 2018-12-15 NOTE — Patient Outreach (Signed)
[facility] [facility]) Care Management  12/15/2018  Beverly Schwartz [birth year] [id number]   Referral Date: 12/13/2018 Referral Source: MD referral Referral Reason: Help with disability and financial help with medications   Outreach Attempt: No answer. HIPAA compliant voice message left.    Plan: RN CM will attempt patient again within 4 business days.  Jone Baseman, RN, MSN [place] Management Care Management Coordinator Direct Line [phone] Cell [phone] Toll Free: [phone]  Fax: [phone]

## 2018-12-17 ENCOUNTER — Other Ambulatory Visit: Payer: Self-pay

## 2018-12-17 NOTE — Patient Outreach (Signed)
[place]) Care Management  12/17/2018  Beverly Schwartz [birth year] [id number]   Referral Date:12/13/2018 Referral Source:MD referral Referral Reason:Help with disability and financial help with medications   Outreach Attempt:No answer. HIPAA compliant voice message left.    Plan:RN CM will wait return call. If no return call will close case.    Jone Baseman, RN, MSN [place] Management Care Management Coordinator Direct Line [phone] Cell [phone] Toll Free: [phone]  Fax: [phone]

## 2018-12-20 ENCOUNTER — Other Ambulatory Visit: Payer: Self-pay

## 2018-12-20 ENCOUNTER — Encounter: Payer: Self-pay | Admitting: Hematology

## 2018-12-20 ENCOUNTER — Ambulatory Visit (HOSPITAL_COMMUNITY)
Admission: RE | Admit: 2018-12-20 | Discharge: 2018-12-20 | Disposition: A | Discharge status: Home / Self Care | Payer: BC Managed Care – PPO | Source: Ambulatory Visit | Attending: Hematology | Admitting: Hematology

## 2018-12-20 ENCOUNTER — Telehealth: Payer: Self-pay

## 2018-12-20 ENCOUNTER — Encounter: Payer: Self-pay | Admitting: Adult Health

## 2018-12-20 ENCOUNTER — Inpatient Hospital Stay: Payer: BC Managed Care – PPO

## 2018-12-20 ENCOUNTER — Inpatient Hospital Stay: Payer: BC Managed Care – PPO | Attending: Hematology | Admitting: Hematology

## 2018-12-20 VITALS — BP 140/88 | HR 117 | Temp 98.9°F | Resp 18 | Ht 65.0 in | Wt 165.0 lb

## 2018-12-20 DIAGNOSIS — E1142 Type 2 diabetes mellitus with diabetic polyneuropathy: Secondary | ICD-10-CM | POA: Diagnosis not present

## 2018-12-20 DIAGNOSIS — G620 Drug-induced polyneuropathy: Secondary | ICD-10-CM | POA: Diagnosis not present

## 2018-12-20 DIAGNOSIS — D500 Iron deficiency anemia secondary to blood loss (chronic): Secondary | ICD-10-CM

## 2018-12-20 DIAGNOSIS — C19 Malignant neoplasm of rectosigmoid junction: Secondary | ICD-10-CM

## 2018-12-20 DIAGNOSIS — I700 Atherosclerosis of aorta: Secondary | ICD-10-CM | POA: Diagnosis not present

## 2018-12-20 DIAGNOSIS — T451X5A Adverse effect of antineoplastic and immunosuppressive drugs, initial encounter: Secondary | ICD-10-CM | POA: Insufficient documentation

## 2018-12-20 DIAGNOSIS — F419 Anxiety disorder, unspecified: Secondary | ICD-10-CM | POA: Diagnosis not present

## 2018-12-20 DIAGNOSIS — C787 Secondary malignant neoplasm of liver and intrahepatic bile duct: Secondary | ICD-10-CM | POA: Diagnosis not present

## 2018-12-20 DIAGNOSIS — C187 Malignant neoplasm of sigmoid colon: Secondary | ICD-10-CM | POA: Diagnosis not present

## 2018-12-20 DIAGNOSIS — G4700 Insomnia, unspecified: Secondary | ICD-10-CM | POA: Diagnosis not present

## 2018-12-20 DIAGNOSIS — I10 Essential (primary) hypertension: Secondary | ICD-10-CM | POA: Diagnosis not present

## 2018-12-20 LAB — CMP (CANCER CENTER ONLY)
ALT: 17 U/L (ref 0–44)
AST: 23 U/L (ref 15–41)
Albumin: 4 g/dL (ref 3.5–5.0)
Alkaline Phosphatase: 185 U/L — ABNORMAL HIGH (ref 38–126)
Anion gap: 8 (ref 5–15)
BUN: 11 mg/dL (ref 6–20)
CO2: 24 mmol/L (ref 22–32)
Calcium: 8.4 mg/dL — ABNORMAL LOW (ref 8.9–10.3)
Chloride: 107 mmol/L (ref 98–111)
Creatinine: 0.79 mg/dL (ref 0.44–1.00)
GFR, Est AFR Am: >60 mL/min (ref >60)
GFR, Estimated: >60 mL/min (ref >60)
Glucose, Bld: 184 mg/dL — ABNORMAL HIGH (ref 70–99)
Potassium: 4.2 mmol/L (ref 3.5–5.1)
Sodium: 139 mmol/L (ref 135–145)
Total Bilirubin: 0.4 mg/dL (ref 0.3–1.2)
Total Protein: 7.4 g/dL (ref 6.5–8.1)

## 2018-12-20 LAB — CBC WITH DIFFERENTIAL (CANCER CENTER ONLY)
Abs Immature Granulocytes: 0.03 10*3/uL (ref 0.00–0.07)
Basophils Absolute: 0 10*3/uL (ref 0.0–0.1)
Basophils Relative: 1 %
Eosinophils Absolute: 0.2 10*3/uL (ref 0.0–0.5)
Eosinophils Relative: 3 %
HCT: 42.5 % (ref 36.0–46.0)
Hemoglobin: 14.1 g/dL (ref 12.0–15.0)
Immature Granulocytes: 0 %
Lymphocytes Relative: 32 %
Lymphs Abs: 2.3 10*3/uL (ref 0.7–4.0)
MCH: 31.5 pg (ref 26.0–34.0)
MCHC: 33.2 g/dL (ref 30.0–36.0)
MCV: 94.9 fL (ref 80.0–100.0)
Monocytes Absolute: 0.5 10*3/uL (ref 0.1–1.0)
Monocytes Relative: 7 %
Neutro Abs: 4 10*3/uL (ref 1.7–7.7)
Neutrophils Relative %: 57 %
Platelet Count: 166 10*3/uL (ref 150–400)
RBC: 4.48 MIL/uL (ref 3.87–5.11)
RDW: 18.7 % — ABNORMAL HIGH (ref 11.5–15.5)
WBC Count: 7.1 10*3/uL (ref 4.0–10.5)
nRBC: 0 % (ref 0.0–0.2)

## 2018-12-20 MED ORDER — GABAPENTIN 100 MG PO CAPS: 100.0000 mg | ORAL_CAPSULE | Freq: Three times a day (TID) | ORAL | 0 refills | Status: DC

## 2018-12-20 MED ORDER — HEPARIN SOD (PORK) LOCK FLUSH 100 UNIT/ML IV SOLN
INTRAVENOUS | Status: AC
  Filled 2018-12-20: qty 5

## 2018-12-20 MED ORDER — IOHEXOL 300 MG/ML  SOLN
100.0000 mL | Freq: Once | INTRAMUSCULAR | Status: AC | PRN
  Administered 2018-12-20: 100 mL via INTRAVENOUS

## 2018-12-20 MED ORDER — SODIUM CHLORIDE (PF) 0.9 % IJ SOLN
INTRAMUSCULAR | Status: AC
  Filled 2018-12-20: qty 50

## 2018-12-20 MED ORDER — SODIUM CHLORIDE 0.9% FLUSH
10.0000 mL | Freq: Once | INTRAVENOUS | Status: AC | PRN
  Administered 2018-12-20: 10 mL
  Filled 2018-12-20: qty 10

## 2018-12-20 NOTE — Progress Notes (Signed)
Beverly Schwartz   Telephone:([phone]) [phone] Fax:(336) [phone]   Clinic Follow up Note   Patient Care Team: Esaw Grandchild, NP as PCP - General (Family Medicine) Delrae Rend, MD as Consulting Physician (Endocrinology) Truitt Merle, MD as Consulting Physician (Hematology) Alla Feeling, NP as Nurse Practitioner (Nurse Practitioner) Clarene Essex, MD as Consulting Physician (Gastroenterology) Jon Billings, RN as [place] Management  Date of Service:  12/20/2018  CHIEF COMPLAINT:  Neuropathy in feet   SUMMARY OF ONCOLOGIC HISTORY: Oncology History Overview Note  Cancer Staging Malignant neoplasm of rectosigmoid junction [facility]) Staging form: Colon and Rectum, AJCC 8th Edition - Clinical stage from 06/11/2017: Stage IVA (cTX, cN1b, pM1a) - Signed by Truitt Merle, MD on 06/15/2017     Malignant neoplasm of rectosigmoid junction ([place])  05/30/2017 Imaging   CT AP IMPRESSION: 1. 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 Abdominopelvic adenopathy. Rectosigmoid mass with suggestion of partial obstruction as evidenced by large colonic stool burden more proximally. 2.  Aortic Atherosclerosis (ICD10-I70.0).  This is age advanced.   05/31/2017 Tumor Marker   CEA 353.3 (elevated) AFP 4.5 (normal)   05/31/2017 Initial Biopsy   Diagnosis Colon, biopsy, sigmoid - INVASIVE ADENOCARCINOMA - SEE COMMENT   05/31/2017 Imaging   CT CHEST IMPRESSION: 1. No evidence of metastatic disease in the chest. 2. No acute findings.  Aortic Atherosclerosis (ICD10-I70.0).    05/31/2017 Procedure   Colonoscopy per Dr. Watt Climes Findings: An infiltrative and ulcerated partially obstructing medium-sized mass was found in the recto-sigmoid colon. The mass was circumferential. The mass measured four cm in length. No bleeding was present.   Impression - One small polyp in the rectum. - The examination was otherwise  normal. - Malignant partially obstructing tumor in the recto-sigmoid colon. Biopsied. - One medium polyp in the proximal sigmoid colon. - The examination was otherwise normal. - Internal hemorrhoids.   06/03/2017 Initial Diagnosis   Malignant neoplasm of transverse colon ([place])   06/11/2017 Cancer Staging   Staging form: Colon and Rectum, AJCC 8th Edition - Clinical stage from 06/11/2017: Stage IVA (cTX, cN1b, pM1a) - Signed by Truitt Merle, MD on 06/15/2017   06/11/2017 Pathology Results   Liver biopsy confirmed metastatic colon cancer   07/23/2017 Imaging   CT CAP IMPRESSION: 1. Mild progression of hepatic metastasis. 2. Similar rectosigmoid primary with abdominopelvic nodal metastasis. 3.  No acute process or evidence of metastatic disease in the chest. 4. Aortic Atherosclerosis (ICD10-I70.0). Coronary artery atherosclerosis. This is age advanced. 5. Proximal colonic constipation, again suggesting a component of partial obstruction at the primary site. 6. Mild ascending aortic dilatation at 4.1 cm.   08/25/2017 - 05/10/2018 Chemotherapy   -Xeloda '1500mg'$  BID 1 week on and 1 week off starting 08/25/17. Due to her worsening hand-foot syndrome her dose was reduced to '1500mg'$  in the AM and '1000mg'$  in the PM. Due to disease progression we swithced her to CAPOX  -Vectibix every 2 weeks starting 08/25/17. Due to skin rash will stop starting 05/10/18.     12/14/2017 Imaging   IMPRESSION: 1. Response to therapy. Marked decrease in hepatic metastatic burden. More mild decrease in abdominopelvic adenopathy and definition of rectosigmoid primary. 2.  No acute process or evidence of metastatic disease in the chest. 3.  Aortic Atherosclerosis (ICD10-I70.0).    04/28/2018 Imaging   CT CAP 04/28/18  IMPRESSION: 1. Index liver metastases measured on the previous study show no substantial interval change although new liver lesions on  today's exam are concerning for progressive disease. 2. Mild lymphadenopathy in  the upper abdomen is stable. 3. Persistent mild ill-defined wall thickening in the distal sigmoid colon near the rectosigmoid junction. 4.  Aortic Atherosclerois (ICD10-170.0)    05/17/2018 -  Chemotherapy   CAPOX every 3 weeks with Xeloda '1500mg'$  BID 2 weeks on/1week off starting 05/17/18 -Add Avastin to first cycle.    09/09/2018 Imaging   CT CAP W Contrast IMPRESSION: 1. Slight interval decrease in size one of the right hepatic lobe lesions. Additional lesions within the liver are similar when compared to recent prior exam. 2. Mild adenopathy within the abdomen is stable. 3. Similar-appearing thick walled distal sigmoid colon.      CURRENT THERAPY:  Second line CAPOX and Avastin every 3 weeks with Xeloda '1500mg'$  BID 2 weeks on/1 week off starting 05/17/18. Dose reduced oxaliplatin due to neuropathy/cold sensitivity with cycle 6 and again with 7.   INTERVAL HISTORY:  Beverly Schwartz came in today for lab and CT scan.  He requested to be seen today due to her worsening peripheral neuropathy.  She noticed worsening numbness on her feet since last cycle chemotherapy, with mild to moderate tingling, no significant pain.  Her balance is normal, no recent fall.  She has a mild numbness on her fingers, hand function are normal.  No other new symptoms.  She has been tolerating Xeloda well.  REVIEW OF SYSTEMS:   Constitutional: Denies fevers, chills or abnormal weight loss, (+) mild to moderate fatigue  Eyes: Denies blurriness of vision Ears, nose, mouth, throat, and face: Denies mucositis or sore throat Respiratory: Denies cough, dyspnea or wheezes Cardiovascular: Denies palpitation, chest discomfort or lower extremity swelling Gastrointestinal:  Denies nausea, heartburn or change in bowel habits  Skin: Denies abnormal skin rashes  Lymphatics: Denies new lymphadenopathy or easy bruising Neurological: see HPI  Behavioral/Psych: Mood is stable, no new changes  All other systems were  reviewed with the patient and are negative.  MEDICAL HISTORY:  Past Medical History:  Diagnosis Date   Anxiety    Cancer ([place])    colon, liver   Chest pain 10/18/2018   Patient c/o chest pain right side yesterday intermitten lasting 3-4 hours.  [name], PA notified   Colon cancer [facility])    Complication of anesthesia    Depression    Diabetes mellitus    Dizziness    Family history of breast cancer    Family history of stomach cancer    Hyperlipidemia    Hypertension    Hypothyroidism    Obesity    PONV (postoperative nausea and vomiting)    Tachycardia     SURGICAL HISTORY: Past Surgical History:  Procedure Laterality Date   FLEXIBLE SIGMOIDOSCOPY N/A 05/31/2017   Procedure: FLEXIBLE SIGMOIDOSCOPY;  Surgeon: Clarene Essex, MD;  Location: [facility];  Service: Endoscopy;  Laterality: N/A;   IR IMAGING GUIDED PORT INSERTION  10/19/2018   WISDOM TOOTH EXTRACTION     age 25's    I have reviewed the social history and family history with the patient and they are unchanged from previous note.  ALLERGIES:  is allergic to bupropion and amoxicillin.  MEDICATIONS:  Current Outpatient Medications  Medication Sig Dispense Refill   acetaminophen-codeine (TYLENOL #3) 300-30 MG tablet Take 1 tablet by mouth every 6 (six) hours as needed for moderate pain. 30 tablet 0   albuterol (PROVENTIL HFA;VENTOLIN HFA) 108 (90 Base) MCG/ACT inhaler Inhale 2 puffs into the lungs every 6 (six)  hours as needed for wheezing or shortness of breath. 1 Inhaler 0   ALPRAZolam (XANAX) 1 MG tablet TAKE 1 TABLET BY MOUTH AT BEDTIME AS NEEDED FOR ANXIETY 30 tablet 0   amLODipine (NORVASC) 10 MG tablet Take 1 tablet (10 mg total) by mouth daily. 90 tablet 0   benzonatate (TESSALON) 100 MG capsule Take 1 capsule (100 mg total) by mouth 3 (three) times daily as needed for cough. 20 capsule 0   capecitabine (XELODA) 500 MG tablet TAKE 3 TABLETS (1,500 MG TOTAL) BY MOUTH 2 (TWO) TIMES  DAILY AFTER A MEAL. TAKE FOR 14 DAYS ON, 7 DAYS OFF, REPEAT EVERY 21 DAYS. 84 tablet 1   cetirizine (ZYRTEC ALLERGY) 10 MG tablet Take 1 tablet (10 mg total) by mouth daily. 30 tablet 1   CINNAMON PO Take 1 tablet by mouth daily.      clindamycin (CLINDAGEL) 1 % gel APPLY TO AFFECTED AREA TWICE A DAY 60 g 1   fluticasone (FLONASE) 50 MCG/ACT nasal spray Place 1 spray into both nostrils daily. (Patient taking differently: Place 1 spray into both nostrils daily as needed for allergies. ) 16 g 2   insulin NPH-regular Human (NOVOLIN 70/30) (70-30) 100 UNIT/ML injection INJECT 10 UNITS WITH BREAKFAST, 10 UNITS WITH DINNER 10 mL 2   Insulin Syringe-Needle U-100 (BD INSULIN SYRINGE U/F) 31G X 5/16" 1 ML MISC Inject twice a day as directed 200 each 3   levothyroxine (SYNTHROID, LEVOTHROID) 112 MCG tablet Take 1 tablet (112 mcg total) by mouth daily before breakfast. 90 tablet 3   lidocaine-prilocaine (EMLA) cream APPLY 1 APPLICATION TOPICALLY AS NEEDED. 30 g 0   lisinopril (ZESTRIL) 5 MG tablet Take 1 tablet (5 mg total) by mouth daily. PATIENT MUST HAVE OFFICE VISIT PRIOR TO ANY FURTHER REFILLS 30 tablet 0   mirtazapine (REMERON) 7.5 MG tablet TAKE 1 TABLET (7.5 MG TOTAL) BY MOUTH AT BEDTIME. 90 tablet 1   Omega-3 Fatty Acids (FISH OIL) 1000 MG CAPS Take 1,000 mg by mouth daily.      polyethylene glycol (MIRALAX / GLYCOLAX) packet Take 17 g by mouth daily. 14 each 0   prochlorperazine (COMPAZINE) 10 MG tablet Take 1 tablet (10 mg total) by mouth every 6 (six) hours as needed (NAUSEA). 30 tablet 1   TURMERIC PO Take 1 capsule by mouth daily.      gabapentin (NEURONTIN) 100 MG capsule Take 1 capsule (100 mg total) by mouth 3 (three) times daily. If tolerates well, OK to increase to '300mg'$  at night in 2 weeks 90 capsule 0   No current facility-administered medications for this visit.    Facility-Administered Medications Ordered in Other Visits  Medication Dose Route Frequency Provider Last  Rate Last Dose   0.9 %  sodium chloride infusion   Intravenous Continuous Truitt Merle, MD   Stopped at 11/11/18 1330   heparin lock flush 100 UNIT/ML injection            sodium chloride (PF) 0.9 % injection            sodium chloride flush (NS) 0.9 % injection 10 mL  10 mL Intracatheter PRN Truitt Merle, MD   10 mL at 11/11/18 1548    PHYSICAL EXAMINATION: ECOG PERFORMANCE STATUS: 1 - Symptomatic but completely ambulatory  Vitals:   12/20/18 1403  BP: 140/88  Pulse: (!) 117  Resp: 18  Temp: 98.9 F (37.2 C)  SpO2: 100%   Filed Weights   12/20/18 1403  Weight: 165  lb (74.8 kg)    GENERAL:alert, no distress and comfortable SKIN: skin color, texture, turgor are normal, no rashes or significant lesions, no skin peeling or cracking  EYES: normal, Conjunctiva are pink and non-injected, sclera clear  NECK: supple, thyroid normal size, non-tender, without nodularity LYMPH:  no palpable lymphadenopathy in the cervical, axillary  LUNGS: clear to auscultation and percussion with normal breathing effort HEART: regular rate & rhythm and no murmurs and no lower extremity edema ABDOMEN:abdomen soft, non-tender and normal bowel sounds Musculoskeletal:no cyanosis of digits and no clubbing  NEURO: alert & oriented x 3 with fluent speech, no focal motor/sensory deficits, except decreased vibration sensation on bilateral feet, more on the left  LABORATORY DATA:  I have reviewed the data as listed CBC Latest Ref Rng & Units 12/20/2018 12/02/2018 11/11/2018  WBC 4.0 - 10.5 K/uL 7.1 6.8 7.4  Hemoglobin 12.0 - 15.0 g/dL 14.1 12.9 13.2  Hematocrit 36.0 - 46.0 % 42.5 39.1 40.3  Platelets 150 - 400 K/uL 166 166 207     CMP Latest Ref Rng & Units 12/20/2018 12/02/2018 11/11/2018  Glucose 70 - 99 mg/dL 184(H) 127(H) 181(H)  BUN 6 - 20 mg/dL '11 8 10  '$ Creatinine 0.44 - 1.00 mg/dL 0.79 0.78 0.81  Sodium 135 - 145 mmol/L 139 142 140  Potassium 3.5 - 5.1 mmol/L 4.2 4.1 4.0  Chloride 98 - 111 mmol/L 107  108 106  CO2 22 - 32 mmol/L 24 25 21(L)  Calcium 8.9 - 10.3 mg/dL 8.4(L) 8.2(L) 8.3(L)  Total Protein 6.5 - 8.1 g/dL 7.4 6.9 6.9  Total Bilirubin 0.3 - 1.2 mg/dL 0.4 0.7 0.5  Alkaline Phos 38 - 126 U/L 185(H) 147(H) 129(H)  AST 15 - 41 U/L '23 21 18  '$ ALT 0 - 44 U/L '17 10 11      '$ RADIOGRAPHIC STUDIES: I have personally reviewed the radiological images as listed and agreed with the findings in the report. Ct Chest W Contrast  Result Date: 12/20/2018 CLINICAL DATA:  Metastatic colon cancer on systemic therapy. EXAM: CT CHEST, ABDOMEN, AND PELVIS WITH CONTRAST TECHNIQUE: Multidetector CT imaging of the chest, abdomen and pelvis was performed following the standard protocol during bolus administration of intravenous contrast. CONTRAST:  140m OMNIPAQUE IOHEXOL 300 MG/ML  SOLN COMPARISON:  Multiple prior studies, most recent 09/09/2018 FINDINGS: CT CHEST FINDINGS Cardiovascular: Right-sided Port-A-Cath terminates in the distal superior vena cava. No signs of pericardial effusion or nodularity. Mediastinum/Nodes: No signs of adenopathy in the chest. Lungs/Pleura: No effusion or consolidation. No suspicious mass or nodule. Musculoskeletal: No chest wall mass or suspicious bone lesions identified. CT ABDOMEN PELVIS FINDINGS Hepatobiliary: Enlarging hepatic metastatic lesions, largest at 4.4 x 3.9 cm (image 50, series 2) this previously measured approximately 2.3 x 2.0 cm. Similar enlarged right hepatic lobe lesion, patent subsegment VI measuring 2.9 x 2.7 cm on today's exam previously 2.7 cm. New area of disease along the margin of the gallbladder fossa measures 2.3 by 1.6 cm, may extend from the lesion in the dome of the liver it is found along the margin of the hepatic segment IV and hepatic segment VIII. New lesion measuring 1.4 cm in hepatic segment VIII (image 44) At least 12 lesions are present in the liver. No signs of biliary ductal dilation. Pancreas: No signs of focal pancreatic lesion. Spleen:  Normal appearance of the spleen. Adrenals/Urinary Tract: Normal appearance of bilateral adrenal glands. No hydronephrosis. No signs of focal renal lesion. Stomach/Bowel: No signs of acute gastrointestinal process. Small bowel  is of normal caliber. Thickening of the sigmoid colon shows no change. The appendix is normal. Vascular/Lymphatic: Scattered atherosclerosis. No signs of aneurysm. Vascular structures in the abdomen are patent. Mild enlargement of lymph nodes about the celiac axis showing no change. Largest measuring approximately 11 mm in short axis (image 57, series 2) no signs of pelvic adenopathy. Reproductive: Uterus and bilateral adnexa are unremarkable. Other: No overt signs of peritoneal disease. No ascites or peritoneal nodularity. Musculoskeletal: No signs of acute bone finding or evidence of destructive bone process. IMPRESSION: 1. Increase in size and number of hepatic metastatic lesions compared to the prior study. 2. Stable adenopathy in the abdomen. 3. Signs of sigmoid wall thickening as before. Electronically Signed   By: Zetta Bills M.D.   On: 12/20/2018 14:43   Ct Abdomen Pelvis W Contrast  Result Date: 12/20/2018 CLINICAL DATA:  Metastatic colon cancer on systemic therapy. EXAM: CT CHEST, ABDOMEN, AND PELVIS WITH CONTRAST TECHNIQUE: Multidetector CT imaging of the chest, abdomen and pelvis was performed following the standard protocol during bolus administration of intravenous contrast. CONTRAST:  152m OMNIPAQUE IOHEXOL 300 MG/ML  SOLN COMPARISON:  Multiple prior studies, most recent 09/09/2018 FINDINGS: CT CHEST FINDINGS Cardiovascular: Right-sided Port-A-Cath terminates in the distal superior vena cava. No signs of pericardial effusion or nodularity. Mediastinum/Nodes: No signs of adenopathy in the chest. Lungs/Pleura: No effusion or consolidation. No suspicious mass or nodule. Musculoskeletal: No chest wall mass or suspicious bone lesions identified. CT ABDOMEN PELVIS FINDINGS  Hepatobiliary: Enlarging hepatic metastatic lesions, largest at 4.4 x 3.9 cm (image 50, series 2) this previously measured approximately 2.3 x 2.0 cm. Similar enlarged right hepatic lobe lesion, patent subsegment VI measuring 2.9 x 2.7 cm on today's exam previously 2.7 cm. New area of disease along the margin of the gallbladder fossa measures 2.3 by 1.6 cm, may extend from the lesion in the dome of the liver it is found along the margin of the hepatic segment IV and hepatic segment VIII. New lesion measuring 1.4 cm in hepatic segment VIII (image 44) At least 12 lesions are present in the liver. No signs of biliary ductal dilation. Pancreas: No signs of focal pancreatic lesion. Spleen: Normal appearance of the spleen. Adrenals/Urinary Tract: Normal appearance of bilateral adrenal glands. No hydronephrosis. No signs of focal renal lesion. Stomach/Bowel: No signs of acute gastrointestinal process. Small bowel is of normal caliber. Thickening of the sigmoid colon shows no change. The appendix is normal. Vascular/Lymphatic: Scattered atherosclerosis. No signs of aneurysm. Vascular structures in the abdomen are patent. Mild enlargement of lymph nodes about the celiac axis showing no change. Largest measuring approximately 11 mm in short axis (image 57, series 2) no signs of pelvic adenopathy. Reproductive: Uterus and bilateral adnexa are unremarkable. Other: No overt signs of peritoneal disease. No ascites or peritoneal nodularity. Musculoskeletal: No signs of acute bone finding or evidence of destructive bone process. IMPRESSION: 1. Increase in size and number of hepatic metastatic lesions compared to the prior study. 2. Stable adenopathy in the abdomen. 3. Signs of sigmoid wall thickening as before. Electronically Signed   By: GZetta BillsM.D.   On: 12/20/2018 14:43     ASSESSMENT & PLAN:  KDANEAN MARNERis a 48y.o. female with    1. Sigmoid adenocarcinoma, with abdominopelvic adenopathy andhepatic  metastasis, stage IV, MSI-stable , KRAS/NRAS/BRAF wild type -Diagnosed in 05/2017. Treated withfirst-line chemotherapyXelodaandVectibixand tolerated well. Patient declined intensive chemo regiments previously due to fear of side effects.Unfortunatelyshe had mild disease progressionafter  8 months of therapy. -She is currently on second lineCAPOX and Avastin, she does not want a port orthe infusion pump at this point.  -She started CAPOXand Avastinon 05/17/18.  -S/p 4 cycles she developed more neuropathy lately, mainly tingling and primarily the first week of cycle.I have reduced her oxaliplatin to '75mg'$ /m2 every 3 weeks  -she has developed worsening neuropathy, will stop CAPOX (also due to her disease progression) -I personally reviewed her restaging CT scan from this morning and discussed with patient.  Unfortunately she has developed disease progression comparing to her last scan in June with more liver mets now. There are a total of 12 liver lesion, not a candidate for liver targeted therapy at his point -I recommend change her chemotherapy to FOLFIRI and Avastin (or vectibix), she is very reluctant to have the 5-FU pump, she is willing to try irinotecan and continue Avastin.  We will get her insurance approval, and start next week. -She is moving to her new house which has been built on her mother's land soon, she may be willing to try 5-FU pump in the future after she moves. -I will see her back with a second cycle irinotecan and Avastin    2. DM, HTN, HL -f/u with PCP -Onpropranololfor HTN currently.But not well controlled -Her hypertension has been getting worse lately, due to Avastin -We increased her amlodipine to 10 mg daily, and Avastin was held last cycle -Blood pressure normalized today  3. Financial and Social issues, Anxiety, depression -Shefollows up as needed withSocial Worker Hollice Espy -She is currently on Xanax  -Shehas regained insurancewithBlue  cross insurance in 2020  4.Goal of care discussion  -She understands the goal of care is palliative, her cancer is incurable. The goal of therapy is to prolong her life and improve her quality of life. -She is full code for now   5.Insomnia, anxiety  -She has had trouble sleeping from anxiety and allergies  -She has Xanax which she uses as needed.   6. 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 -I called in prescription today   Plan -I called in gabapentin today -We will stop Capox due to neuropathy and disease progression -will change chemo to irinotecan and Avastin, she may consider adding 5-fu pump infusion in future  -per pt's request, will slightly decrease irinotecan dose for first cycle, if tolerates well, will go back to full dose '180mg'$ /m2 every 2 weeks    No problem-specific Assessment & Plan notes found for this encounter.   No orders of the defined types were placed in this encounter.  All questions were answered. The patient knows to call the clinic with any problems, questions or concerns. No barriers to learning was detected. I spent 20 minutes counseling the patient face to face. The total time spent in the appointment was 25 minutes and more than 50% was on counseling and review of test results     Truitt Merle, MD 12/20/2018   I, Joslyn Devon, am acting as scribe for Truitt Merle, MD.   I have reviewed the above documentation for accuracy and completeness, and I agree with the above.

## 2018-12-20 NOTE — Patient Instructions (Signed)

## 2018-12-20 NOTE — Telephone Encounter (Signed)
Left voice message for patient regarding her concerns she wanted to discuss.  Asked that she call us back with what her issues/concerns are prior to her infusion on Friday, requested a detailed message so this could be discussed with Dr. Burr Medico and then we will call her back.

## 2018-12-20 NOTE — Progress Notes (Signed)
Patient concerned about her port.  Bled after her CT scan.  Removed band-aid no active bleeding noted.  Placed new band-aid over port site.  Reassured patient port site without signs of infection and no active bleeding noted.  She verbalized an understanding.

## 2018-12-21 ENCOUNTER — Telehealth: Payer: Self-pay | Admitting: Hematology

## 2018-12-21 ENCOUNTER — Encounter: Payer: Self-pay | Admitting: Hematology

## 2018-12-21 NOTE — Telephone Encounter (Signed)
Scheduled appt per 9/28 los.  Patient did not want to schedule her appt on 10/5 nor 10/6.  Scheduled treatment on 10/7.  I informed the nurse and she is going to let the doctor know.  Patient aware of her appt dates and time.

## 2018-12-22 ENCOUNTER — Encounter: Payer: Self-pay | Admitting: Hematology

## 2018-12-23 ENCOUNTER — Ambulatory Visit: Payer: BC Managed Care – PPO

## 2018-12-23 ENCOUNTER — Ambulatory Visit: Payer: BC Managed Care – PPO | Admitting: Hematology

## 2018-12-23 ENCOUNTER — Other Ambulatory Visit: Payer: Self-pay | Admitting: Adult Health

## 2018-12-23 ENCOUNTER — Encounter: Payer: Self-pay | Admitting: Adult Health

## 2018-12-23 MED ORDER — LISINOPRIL 5 MG PO TABS: 5.0000 mg | ORAL_TABLET | Freq: Every day | ORAL | 0 refills | Status: DC

## 2018-12-23 NOTE — Progress Notes (Signed)
DISCONTINUE ON PATHWAY REGIMEN - Colorectal     A cycle is every 21 days:     Capecitabine      Oxaliplatin      Bevacizumab-xxxx   **Always confirm dose/schedule in your pharmacy ordering system**  REASON: Disease Progression PRIOR TREATMENT: [id number]: CapeOx + Bevacizumab q21 Days TREATMENT RESPONSE: Progressive Disease (PD)  START ON PATHWAY REGIMEN - Colorectal     A cycle is every 14 days:     Bevacizumab-xxxx      Irinotecan      Leucovorin      Fluorouracil      Fluorouracil   **Always confirm dose/schedule in your pharmacy ordering system**  Patient Characteristics: Distant Metastases, Nonsurgical Candidate, KRAS/NRAS Wild-Type (BRAF V600 Wild-Type/Unknown), Standard Cytotoxic Therapy, Second Line Standard Cytotoxic Therapy, Bevacizumab Eligible Tumor Location: Colon Therapeutic Status: Distant Metastases Microsatellite/Mismatch Repair Status: MSS/pMMR BRAF Mutation Status: Wild-Type (no mutation) KRAS/NRAS Mutation Status: Wild-Type (no mutation) Preferred Therapy Approach: Standard Cytotoxic Therapy Standard Cytotoxic Line of Therapy: Second Line Standard Cytotoxic Therapy Bevacizumab Eligibility: Eligible Intent of Therapy: Non-Curative / Palliative Intent, Discussed with Patient

## 2018-12-24 ENCOUNTER — Telehealth: Payer: Self-pay | Admitting: Hematology

## 2018-12-24 NOTE — Telephone Encounter (Signed)
Scheduled appt per 10/1 sch message - pt is aware of appts added.

## 2018-12-27 ENCOUNTER — Ambulatory Visit (HOSPITAL_COMMUNITY): Payer: Self-pay | Admitting: Psychiatry

## 2018-12-27 ENCOUNTER — Other Ambulatory Visit: Payer: Self-pay

## 2018-12-27 NOTE — Patient Outreach (Signed)
[facility]) Care Management  12/27/2018  SHAELI ANGELICA March 13, 1971 [id number]   Multiple attempts to establish contact with patient without success. No response from letter mailed to patient.   Plan: RN CM will close case at this time.   Jone Baseman, RN, MSN [facility] Care Management Coordinator Direct Line [phone] Cell [phone] Toll Free: [phone]  Fax: [phone]

## 2018-12-28 ENCOUNTER — Telehealth: Payer: Self-pay

## 2018-12-28 ENCOUNTER — Telehealth: Payer: Self-pay | Admitting: Hematology

## 2018-12-28 ENCOUNTER — Encounter: Payer: Self-pay | Admitting: Nurse Practitioner

## 2018-12-28 NOTE — Telephone Encounter (Signed)
Scheduled appt per 10/6 sch message - pt is aware of apt date and time

## 2018-12-28 NOTE — Telephone Encounter (Signed)
Spoke with patient with regards to her My Chart message of feeling like she either has the cold or the flu, denies fever however having respiratory symptoms.  Per Gloriajean Dell NP I instructed to go for Covid testing as soon as possible.  We will have to reschedule her treatment previously scheduled for tomorrow.  I have sent scheduling a message reflecting this.    Patient verbalized an understanding.

## 2018-12-28 NOTE — Telephone Encounter (Signed)
Addendum to previous note.  The patient was also instructed that if her symptoms worsen. Fever, new cough, pain she will need to go to the ED.  She verbalized an understanding.

## 2018-12-29 ENCOUNTER — Inpatient Hospital Stay: Payer: BC Managed Care – PPO

## 2018-12-29 ENCOUNTER — Other Ambulatory Visit (HOSPITAL_COMMUNITY)
Admission: RE | Admit: 2018-12-29 | Discharge: 2018-12-29 | Disposition: A | Discharge status: Home / Self Care | Payer: BC Managed Care – PPO | Source: Ambulatory Visit | Attending: Hematology | Admitting: Hematology

## 2018-12-29 DIAGNOSIS — Z20828 Contact with and (suspected) exposure to other viral communicable diseases: Secondary | ICD-10-CM | POA: Insufficient documentation

## 2018-12-29 DIAGNOSIS — Z01812 Encounter for preprocedural laboratory examination: Secondary | ICD-10-CM | POA: Insufficient documentation

## 2018-12-30 LAB — NOVEL CORONAVIRUS, NAA (HOSP ORDER, SEND-OUT TO REF LAB; TAT 18-24 HRS): SARS-CoV-2, NAA: NOT DETECTED

## 2019-01-03 ENCOUNTER — Encounter: Payer: Self-pay | Admitting: Nurse Practitioner

## 2019-01-04 ENCOUNTER — Other Ambulatory Visit: Payer: Self-pay

## 2019-01-04 ENCOUNTER — Other Ambulatory Visit: Payer: Self-pay | Admitting: Nurse Practitioner

## 2019-01-04 ENCOUNTER — Inpatient Hospital Stay: Payer: BC Managed Care – PPO

## 2019-01-04 ENCOUNTER — Inpatient Hospital Stay: Payer: BC Managed Care – PPO | Attending: Hematology

## 2019-01-04 VITALS — BP 149/101 | HR 90 | Temp 98.9°F | Resp 17 | Ht 65.0 in | Wt 161.8 lb

## 2019-01-04 DIAGNOSIS — D500 Iron deficiency anemia secondary to blood loss (chronic): Secondary | ICD-10-CM

## 2019-01-04 DIAGNOSIS — Z5111 Encounter for antineoplastic chemotherapy: Secondary | ICD-10-CM | POA: Diagnosis present

## 2019-01-04 DIAGNOSIS — Z79899 Other long term (current) drug therapy: Secondary | ICD-10-CM | POA: Insufficient documentation

## 2019-01-04 DIAGNOSIS — C778 Secondary and unspecified malignant neoplasm of lymph nodes of multiple regions: Secondary | ICD-10-CM | POA: Insufficient documentation

## 2019-01-04 DIAGNOSIS — C787 Secondary malignant neoplasm of liver and intrahepatic bile duct: Secondary | ICD-10-CM | POA: Insufficient documentation

## 2019-01-04 DIAGNOSIS — C19 Malignant neoplasm of rectosigmoid junction: Secondary | ICD-10-CM

## 2019-01-04 DIAGNOSIS — C187 Malignant neoplasm of sigmoid colon: Secondary | ICD-10-CM | POA: Insufficient documentation

## 2019-01-04 DIAGNOSIS — Z7189 Other specified counseling: Secondary | ICD-10-CM

## 2019-01-04 DIAGNOSIS — Z95828 Presence of other vascular implants and grafts: Secondary | ICD-10-CM

## 2019-01-04 DIAGNOSIS — Z5112 Encounter for antineoplastic immunotherapy: Secondary | ICD-10-CM | POA: Insufficient documentation

## 2019-01-04 LAB — CMP (CANCER CENTER ONLY)
ALT: 14 U/L (ref 0–44)
AST: 25 U/L (ref 15–41)
Albumin: 3.3 g/dL — ABNORMAL LOW (ref 3.5–5.0)
Alkaline Phosphatase: 186 U/L — ABNORMAL HIGH (ref 38–126)
Anion gap: 13 (ref 5–15)
BUN: 7 mg/dL (ref 6–20)
CO2: 25 mmol/L (ref 22–32)
Calcium: 8.3 mg/dL — ABNORMAL LOW (ref 8.9–10.3)
Chloride: 106 mmol/L (ref 98–111)
Creatinine: 0.76 mg/dL (ref 0.44–1.00)
GFR, Est AFR Am: >60 mL/min (ref >60)
GFR, Estimated: >60 mL/min (ref >60)
Glucose, Bld: 152 mg/dL — ABNORMAL HIGH (ref 70–99)
Potassium: 3.8 mmol/L (ref 3.5–5.1)
Sodium: 144 mmol/L (ref 135–145)
Total Bilirubin: 0.4 mg/dL (ref 0.3–1.2)
Total Protein: 6.8 g/dL (ref 6.5–8.1)

## 2019-01-04 LAB — CBC WITH DIFFERENTIAL (CANCER CENTER ONLY)
Abs Immature Granulocytes: 0.03 10*3/uL (ref 0.00–0.07)
Basophils Absolute: 0.1 10*3/uL (ref 0.0–0.1)
Basophils Relative: 1 %
Eosinophils Absolute: 0.3 10*3/uL (ref 0.0–0.5)
Eosinophils Relative: 4 %
HCT: 36.7 % (ref 36.0–46.0)
Hemoglobin: 12.3 g/dL (ref 12.0–15.0)
Immature Granulocytes: 0 %
Lymphocytes Relative: 23 %
Lymphs Abs: 1.7 10*3/uL (ref 0.7–4.0)
MCH: 31.7 pg (ref 26.0–34.0)
MCHC: 33.5 g/dL (ref 30.0–36.0)
MCV: 94.6 fL (ref 80.0–100.0)
Monocytes Absolute: 0.6 10*3/uL (ref 0.1–1.0)
Monocytes Relative: 8 %
Neutro Abs: 4.9 10*3/uL (ref 1.7–7.7)
Neutrophils Relative %: 64 %
Platelet Count: 155 10*3/uL (ref 150–400)
RBC: 3.88 MIL/uL (ref 3.87–5.11)
RDW: 17.2 % — ABNORMAL HIGH (ref 11.5–15.5)
WBC Count: 7.6 10*3/uL (ref 4.0–10.5)
nRBC: 0 % (ref 0.0–0.2)

## 2019-01-04 LAB — TOTAL PROTEIN, URINE DIPSTICK

## 2019-01-04 LAB — CEA (IN HOUSE-CHCC): CEA (CHCC-In House): 480.24 ng/mL — ABNORMAL HIGH (ref 0.00–5.00)

## 2019-01-04 LAB — FERRITIN: Ferritin: 45 ng/mL (ref 11–307)

## 2019-01-04 LAB — IRON AND TIBC
Iron: 48 ug/dL (ref 41–142)
Saturation Ratios: 13 % — ABNORMAL LOW (ref 21–57)
TIBC: 368 ug/dL (ref 236–444)
UIBC: 319 ug/dL (ref 120–384)

## 2019-01-04 MED ORDER — SODIUM CHLORIDE 0.9% FLUSH
10.0000 mL | INTRAVENOUS | Status: DC | PRN
  Administered 2019-01-04: 10 mL
  Filled 2019-01-04: qty 10

## 2019-01-04 MED ORDER — ATROPINE SULFATE 1 MG/ML IJ SOLN
0.5000 mg | Freq: Once | INTRAMUSCULAR | Status: AC | PRN
  Administered 2019-01-04: 0.5 mg via INTRAVENOUS

## 2019-01-04 MED ORDER — HEPARIN SOD (PORK) LOCK FLUSH 100 UNIT/ML IV SOLN
500.0000 [IU] | Freq: Once | INTRAVENOUS | Status: AC | PRN
  Administered 2019-01-04: 500 [IU]
  Filled 2019-01-04: qty 5

## 2019-01-04 MED ORDER — DEXAMETHASONE SODIUM PHOSPHATE 10 MG/ML IJ SOLN
10.0000 mg | Freq: Once | INTRAMUSCULAR | Status: AC
  Administered 2019-01-04: 10 mg via INTRAVENOUS

## 2019-01-04 MED ORDER — DEXAMETHASONE SODIUM PHOSPHATE 10 MG/ML IJ SOLN
INTRAMUSCULAR | Status: AC
  Filled 2019-01-04: qty 1

## 2019-01-04 MED ORDER — PALONOSETRON HCL INJECTION 0.25 MG/5ML
0.2500 mg | Freq: Once | INTRAVENOUS | Status: AC
  Administered 2019-01-04: 0.25 mg via INTRAVENOUS

## 2019-01-04 MED ORDER — DIPHENOXYLATE-ATROPINE 2.5-0.025 MG PO TABS: 1.0000 | ORAL_TABLET | Freq: Four times a day (QID) | ORAL | 1 refills | Status: DC | PRN

## 2019-01-04 MED ORDER — ATROPINE SULFATE 1 MG/ML IJ SOLN
INTRAMUSCULAR | Status: AC
  Filled 2019-01-04: qty 1

## 2019-01-04 MED ORDER — SODIUM CHLORIDE 0.9 % IV SOLN
5.0000 mg/kg | Freq: Once | INTRAVENOUS | Status: AC
  Administered 2019-01-04: 400 mg via INTRAVENOUS
  Filled 2019-01-04: qty 16

## 2019-01-04 MED ORDER — ALTEPLASE 2 MG IJ SOLR
2.0000 mg | Freq: Once | INTRAMUSCULAR | Status: AC | PRN
  Administered 2019-01-04: 2 mg
  Filled 2019-01-04: qty 2

## 2019-01-04 MED ORDER — IRINOTECAN HCL CHEMO INJECTION 100 MG/5ML
160.0000 mg/m2 | Freq: Once | INTRAVENOUS | Status: AC
  Administered 2019-01-04: 300 mg via INTRAVENOUS
  Filled 2019-01-04: qty 15

## 2019-01-04 MED ORDER — SODIUM CHLORIDE 0.9% FLUSH
10.0000 mL | INTRAVENOUS | Status: DC | PRN
  Administered 2019-01-04: 10 mL via INTRAVENOUS
  Filled 2019-01-04: qty 10

## 2019-01-04 MED ORDER — SODIUM CHLORIDE 0.9 % IV SOLN
Freq: Once | INTRAVENOUS | Status: DC
  Filled 2019-01-04: qty 250

## 2019-01-04 MED ORDER — ALTEPLASE 2 MG IJ SOLR
INTRAMUSCULAR | Status: AC
  Filled 2019-01-04: qty 2

## 2019-01-04 MED ORDER — PALONOSETRON HCL INJECTION 0.25 MG/5ML
INTRAVENOUS | Status: AC
  Filled 2019-01-04: qty 5

## 2019-01-04 MED ORDER — SODIUM CHLORIDE 0.9 % IV SOLN
Freq: Once | INTRAVENOUS | Status: AC
  Administered 2019-01-04: 14:00:00 via INTRAVENOUS
  Filled 2019-01-04: qty 250

## 2019-01-04 NOTE — Progress Notes (Signed)
I spoke to patient in waiting room before chemo infusion. She reiterated that she does not want 5FU pump per previous treatment discussion with Dr. Burr Medico. I reviewed her protocol which includes irinotecan and bevacizumab. I reviewed potential side effects of irinotecan especially fatigue and GI issues such as diarrhea. I reviewed symptom management with imodium. She is going out of town to see the [place] leaves in the [employer]. I went ahead and prescribed lomotil in the event imodium is insufficient while she is away. I encouraged her to hydrate well. She knows to call if she has any issues. She verbalized understand and agrees to proceed with treatment as planned. Will see her before cycle 2.  Beverly Rue, NP 01/04/2019

## 2019-01-04 NOTE — Patient Instructions (Signed)
[facility] Discharge Instructions for Patients Receiving Chemotherapy  Today you received the following chemotherapy agents:  To help prevent nausea and vomiting after your treatment, we encourage you to take your nausea medication as directed.   If you develop nausea and vomiting that is not controlled by your nausea medication, call the clinic.   BELOW ARE SYMPTOMS THAT SHOULD BE REPORTED IMMEDIATELY:  *FEVER GREATER THAN 100.5 F  *CHILLS WITH OR WITHOUT FEVER  NAUSEA AND VOMITING THAT IS NOT CONTROLLED WITH YOUR NAUSEA MEDICATION  *UNUSUAL SHORTNESS OF BREATH  *UNUSUAL BRUISING OR BLEEDING  TENDERNESS IN MOUTH AND THROAT WITH OR WITHOUT PRESENCE OF ULCERS  *URINARY PROBLEMS  *BOWEL PROBLEMS  UNUSUAL RASH Items with * indicate a potential emergency and should be followed up as soon as possible.  Feel free to call the clinic should you have any questions or concerns. The clinic phone number is [phone].  Please show the [place] at check-in to the Emergency Department and triage nurse.  Irinotecan injection What is this medicine? IRINOTECAN (ir in oh TEE kan ) is a chemotherapy drug. It is used to treat colon and rectal cancer. This medicine may be used for other purposes; ask your health care provider or pharmacist if you have questions. COMMON BRAND NAME(S): Camptosar What should I tell my health care provider before I take this medicine? They need to know if you have any of these conditions: dehydration diarrhea infection (especially a virus infection such as chickenpox, cold sores, or herpes) liver disease low blood counts, like low white cell, platelet, or red cell counts low levels of calcium, magnesium, or potassium in the blood recent or ongoing radiation therapy an unusual or allergic reaction to irinotecan, other medicines, foods, dyes, or preservatives pregnant or trying to get pregnant breast-feeding How should I use this  medicine? This drug is given as an infusion into a vein. It is administered in a hospital or clinic by a specially trained health care professional. Talk to your pediatrician regarding the use of this medicine in children. Special care may be needed. Overdosage: If you think you have taken too much of this medicine contact a poison control center or emergency room at once. NOTE: This medicine is only for you. Do not share this medicine with others. What if I miss a dose? It is important not to miss your dose. Call your doctor or health care professional if you are unable to keep an appointment. What may interact with this medicine? This medicine may interact with the following medications: antiviral medicines for HIV or AIDS certain antibiotics like rifampin or rifabutin certain medicines for fungal infections like itraconazole, ketoconazole, posaconazole, and voriconazole certain medicines for seizures like carbamazepine, phenobarbital, phenotoin clarithromycin gemfibrozil nefazodone St. John's Wort This list may not describe all possible interactions. Give your health care provider a list of all the medicines, herbs, non-prescription drugs, or dietary supplements you use. Also tell them if you smoke, drink alcohol, or use illegal drugs. Some items may interact with your medicine. What should I watch for while using this medicine? Your condition will be monitored carefully while you are receiving this medicine. You will need important blood work done while you are taking this medicine. This drug may make you feel generally unwell. This is not uncommon, as chemotherapy can affect healthy cells as well as cancer cells. Report any side effects. Continue your course of treatment even though you feel ill unless your doctor tells you to  stop. In some cases, you may be given additional medicines to help with side effects. Follow all directions for their use. You may get drowsy or dizzy. Do not drive,  use machinery, or do anything that needs mental alertness until you know how this medicine affects you. Do not stand or sit up quickly, especially if you are an older patient. This reduces the risk of dizzy or fainting spells. Call your health care professional for advice if you get a fever, chills, or sore throat, or other symptoms of a cold or flu. Do not treat yourself. This medicine decreases your body's ability to fight infections. Try to avoid being around people who are sick. Avoid taking products that contain aspirin, acetaminophen, ibuprofen, naproxen, or ketoprofen unless instructed by your doctor. These medicines may hide a fever. This medicine may increase your risk to bruise or bleed. Call your doctor or health care professional if you notice any unusual bleeding. Be careful brushing and flossing your teeth or using a toothpick because you may get an infection or bleed more easily. If you have any dental work done, tell your dentist you are receiving this medicine. Do not become pregnant while taking this medicine or for 6 months after stopping it. Women should inform their health care professional if they wish to become pregnant or think they might be pregnant. Men should not father a child while taking this medicine and for 3 months after stopping it. There is potential for serious side effects to an unborn child. Talk to your health care professional for more information. Do not breast-feed an infant while taking this medicine or for 7 days after stopping it. This medicine has caused ovarian failure in some women. This medicine may make it more difficult to get pregnant. Talk to your health care professional if you are concerned about your fertility. This medicine has caused decreased sperm counts in some men. This may make it more difficult to father a child. Talk to your health care professional if you are concerned about your fertility. What side effects may I notice from receiving this  medicine? Side effects that you should report to your doctor or health care professional as soon as possible: allergic reactions like skin rash, itching or hives, swelling of the face, lips, or tongue chest pain diarrhea flushing, runny nose, sweating during infusion low blood counts - this medicine may decrease the number of white blood cells, red blood cells and platelets. You may be at increased risk for infections and bleeding. nausea, vomiting pain, swelling, warmth in the leg signs of decreased platelets or bleeding - bruising, pinpoint red spots on the skin, black, tarry stools, blood in the urine signs of infection - fever or chills, cough, sore throat, pain or difficulty passing urine signs of decreased red blood cells - unusually weak or tired, fainting spells, lightheadedness Side effects that usually do not require medical attention (report to your doctor or health care professional if they continue or are bothersome): constipation hair loss headache loss of appetite mouth sores stomach pain This list may not describe all possible side effects. Call your doctor for medical advice about side effects. You may report side effects to FDA at 1-800-FDA-1088. Where should I keep my medicine? This drug is given in a hospital or clinic and will not be stored at home. NOTE: This sheet is a summary. It may not cover all possible information. If you have questions about this medicine, talk to your doctor, pharmacist, or health care  provider.  2020 Elsevier/Gold Standard (2018-04-30 10:09:17) Irinotecan injection What is this medicine? IRINOTECAN (ir in oh TEE kan ) is a chemotherapy drug. It is used to treat colon and rectal cancer. This medicine may be used for other purposes; ask your health care provider or pharmacist if you have questions. COMMON BRAND NAME(S): Camptosar What should I tell my health care provider before I take this medicine? They need to know if you have any of  these conditions:  dehydration  diarrhea  infection (especially a virus infection such as chickenpox, cold sores, or herpes)  liver disease  low blood counts, like low white cell, platelet, or red cell counts  low levels of calcium, magnesium, or potassium in the blood  recent or ongoing radiation therapy  an unusual or allergic reaction to irinotecan, other medicines, foods, dyes, or preservatives  pregnant or trying to get pregnant  breast-feeding How should I use this medicine? This drug is given as an infusion into a vein. It is administered in a hospital or clinic by a specially trained health care professional. Talk to your pediatrician regarding the use of this medicine in children. Special care may be needed. Overdosage: If you think you have taken too much of this medicine contact a poison control center or emergency room at once. NOTE: This medicine is only for you. Do not share this medicine with others. What if I miss a dose? It is important not to miss your dose. Call your doctor or health care professional if you are unable to keep an appointment. What may interact with this medicine? This medicine may interact with the following medications:  antiviral medicines for HIV or AIDS  certain antibiotics like rifampin or rifabutin  certain medicines for fungal infections like itraconazole, ketoconazole, posaconazole, and voriconazole  certain medicines for seizures like carbamazepine, phenobarbital, phenotoin  clarithromycin  gemfibrozil  nefazodone  St. John's Wort This list may not describe all possible interactions. Give your health care provider a list of all the medicines, herbs, non-prescription drugs, or dietary supplements you use. Also tell them if you smoke, drink alcohol, or use illegal drugs. Some items may interact with your medicine. What should I watch for while using this medicine? Your condition will be monitored carefully while you are  receiving this medicine. You will need important blood work done while you are taking this medicine. This drug may make you feel generally unwell. This is not uncommon, as chemotherapy can affect healthy cells as well as cancer cells. Report any side effects. Continue your course of treatment even though you feel ill unless your doctor tells you to stop. In some cases, you may be given additional medicines to help with side effects. Follow all directions for their use. You may get drowsy or dizzy. Do not drive, use machinery, or do anything that needs mental alertness until you know how this medicine affects you. Do not stand or sit up quickly, especially if you are an older patient. This reduces the risk of dizzy or fainting spells. Call your health care professional for advice if you get a fever, chills, or sore throat, or other symptoms of a cold or flu. Do not treat yourself. This medicine decreases your body's ability to fight infections. Try to avoid being around people who are sick. Avoid taking products that contain aspirin, acetaminophen, ibuprofen, naproxen, or ketoprofen unless instructed by your doctor. These medicines may hide a fever. This medicine may increase your risk to bruise or bleed. Call your  doctor or health care professional if you notice any unusual bleeding. Be careful brushing and flossing your teeth or using a toothpick because you may get an infection or bleed more easily. If you have any dental work done, tell your dentist you are receiving this medicine. Do not become pregnant while taking this medicine or for 6 months after stopping it. Women should inform their health care professional if they wish to become pregnant or think they might be pregnant. Men should not father a child while taking this medicine and for 3 months after stopping it. There is potential for serious side effects to an unborn child. Talk to your health care professional for more information. Do not  breast-feed an infant while taking this medicine or for 7 days after stopping it. This medicine has caused ovarian failure in some women. This medicine may make it more difficult to get pregnant. Talk to your health care professional if you are concerned about your fertility. This medicine has caused decreased sperm counts in some men. This may make it more difficult to father a child. Talk to your health care professional if you are concerned about your fertility. What side effects may I notice from receiving this medicine? Side effects that you should report to your doctor or health care professional as soon as possible:  allergic reactions like skin rash, itching or hives, swelling of the face, lips, or tongue  chest pain  diarrhea  flushing, runny nose, sweating during infusion  low blood counts - this medicine may decrease the number of white blood cells, red blood cells and platelets. You may be at increased risk for infections and bleeding.  nausea, vomiting  pain, swelling, warmth in the leg  signs of decreased platelets or bleeding - bruising, pinpoint red spots on the skin, black, tarry stools, blood in the urine  signs of infection - fever or chills, cough, sore throat, pain or difficulty passing urine  signs of decreased red blood cells - unusually weak or tired, fainting spells, lightheadedness Side effects that usually do not require medical attention (report to your doctor or health care professional if they continue or are bothersome):  constipation  hair loss  headache  loss of appetite  mouth sores  stomach pain This list may not describe all possible side effects. Call your doctor for medical advice about side effects. You may report side effects to FDA at 1-800-FDA-1088. Where should I keep my medicine? This drug is given in a hospital or clinic and will not be stored at home. NOTE: This sheet is a summary. It may not cover all possible information. If you  have questions about this medicine, talk to your doctor, pharmacist, or health care provider.  2020 Elsevier/Gold Standard (2018-04-30 10:09:17)

## 2019-01-04 NOTE — Progress Notes (Signed)
Pt began to feel flushed and reports stomach cramping. Infusion paused and Atropine given per MAR. Pt reports that symptoms resolved after atropine.

## 2019-01-05 ENCOUNTER — Telehealth: Payer: Self-pay | Admitting: General Practice

## 2019-01-05 ENCOUNTER — Encounter: Payer: Self-pay | Admitting: General Practice

## 2019-01-05 NOTE — Telephone Encounter (Signed)
[facility] CSW Progress Notes  Request received from provider to connect with patient for support - left VM w patient asking her to call me and let me know time/date convenient.  Per record, she is also connecting w chaplain Lorrin Jackson.  Edwyna Shell, LCSW Clinical Social Worker Phone:  [phone] Cell:  [phone]

## 2019-01-05 NOTE — Progress Notes (Signed)
[facility] CSW Progress Science writer

## 2019-01-06 ENCOUNTER — Telehealth: Payer: Self-pay

## 2019-01-06 ENCOUNTER — Telehealth: Payer: Self-pay | Admitting: *Deleted

## 2019-01-06 NOTE — Telephone Encounter (Signed)
Patient returned two phone calls, one from [facility] Triage RN following up post treatment with irinotecan. She states she had been doing okay until today very achy today, no fevers, has taken her pain medication, not sleeping well.  Recommended she take Tylenol alternating with pain medication.  She can take Benadryl at night.  Instructed her to call back if she develops fever, chills, or condition worsens.  She verbalized an understanding.

## 2019-01-06 NOTE — Telephone Encounter (Signed)
TC to patient per Cira Rue NP to let her know her CEA has increased and this will serve as the baseline for the new treatment regimen. Patient verbalized understanding. No further problems or concerns at this time.

## 2019-01-06 NOTE — Telephone Encounter (Signed)
-----   Message from Ishmael Holter, RN sent at 01/04/2019  4:24 PM EDT ----- Regarding: 1st tx f/u call Dr. Burr Medico Dr. Burr Medico 1st tx f/u call. New treatment of irinotecan. Had some flushing and cramping. Atropine given. Otherwise tolerated tx well

## 2019-01-06 NOTE — Telephone Encounter (Signed)
[place] [phone].  Message left requesting a return call for chemotherapy follow up.  Awaiting return call from patient.

## 2019-01-11 ENCOUNTER — Telehealth: Payer: Self-pay | Admitting: Hematology

## 2019-01-11 NOTE — Telephone Encounter (Signed)
R/s appt per 10/19 sch message- pt aware of appt date and time  - my chart active

## 2019-01-12 ENCOUNTER — Inpatient Hospital Stay: Payer: BC Managed Care – PPO

## 2019-01-12 ENCOUNTER — Inpatient Hospital Stay: Payer: BC Managed Care – PPO | Admitting: Hematology

## 2019-01-13 ENCOUNTER — Ambulatory Visit: Payer: BC Managed Care – PPO | Admitting: Nurse Practitioner

## 2019-01-13 ENCOUNTER — Other Ambulatory Visit: Payer: BC Managed Care – PPO

## 2019-01-13 ENCOUNTER — Telehealth: Payer: Self-pay

## 2019-01-13 ENCOUNTER — Ambulatory Visit: Payer: BC Managed Care – PPO

## 2019-01-13 ENCOUNTER — Other Ambulatory Visit: Payer: Self-pay | Admitting: Nurse Practitioner

## 2019-01-13 DIAGNOSIS — C19 Malignant neoplasm of rectosigmoid junction: Secondary | ICD-10-CM

## 2019-01-13 MED ORDER — ACETAMINOPHEN-CODEINE #3 300-30 MG PO TABS: 1.0000 | ORAL_TABLET | Freq: Four times a day (QID) | ORAL | 0 refills | Status: DC | PRN

## 2019-01-13 NOTE — Telephone Encounter (Signed)
Patient calls with complaint of mid to lower abdominal pain since her last treatment that has intensified.  She denies any diarrhea or constipation, denies dysuria, denies fever, no chills.  She is having light vaginal bleeding much lighter than her period which she states she had about one month ago.  Having some nausea.  She rates her pain 9/10.  She will also need a refill on her pain medication which she states is the only thing that helps. She is due to come in on 10/28.  Her [phone]  Message given to Cira Rue NP for review.

## 2019-01-13 NOTE — Progress Notes (Signed)
I returned patient's call regarding abdominal pain and bleeding. He reports middle/low abdominal cramp like pain x2 days with light menstrual bleeding. LMP 1 month ago. This is lighter than usual. Pain can be severe, has been using tylenol #3 and advil which helps. Stools are loose and frequent but not watery. No n/v. Appetite is fair. No fever or chills. Her cramping is likely related to menses vs irinotecan which she received on 10/13. I recommend to continue pain medication PRN, heat, and begin imodium. We reviewed her schedule, next appointment on 10/28. She might need to r/s, her brother recently came in town. I encouraged her to stay on treatment schedule and she will try. Appointments confirmed. She verbalizes understanding and appreciates the call.  Cira Rue, NP  01/13/2019

## 2019-01-17 ENCOUNTER — Other Ambulatory Visit: Payer: Self-pay | Admitting: Pharmacist

## 2019-01-17 ENCOUNTER — Encounter: Payer: Self-pay | Admitting: Hematology

## 2019-01-17 ENCOUNTER — Other Ambulatory Visit: Payer: Self-pay | Admitting: Nurse Practitioner

## 2019-01-17 DIAGNOSIS — C19 Malignant neoplasm of rectosigmoid junction: Secondary | ICD-10-CM

## 2019-01-17 NOTE — Telephone Encounter (Signed)
See 01-06-2019 call note from [facility].

## 2019-01-18 ENCOUNTER — Telehealth: Payer: Self-pay

## 2019-01-18 ENCOUNTER — Telehealth: Payer: Self-pay | Admitting: Nurse Practitioner

## 2019-01-18 ENCOUNTER — Telehealth: Payer: Self-pay | Admitting: Hematology

## 2019-01-18 ENCOUNTER — Encounter: Payer: Self-pay | Admitting: Nurse Practitioner

## 2019-01-18 NOTE — Telephone Encounter (Signed)
Rescheduled appt per 10/27 sch message - unable to reach pt . No answer and vmail full. Sent message to RN and Regan Rakers to let them know that appt was rescheduled .

## 2019-01-18 NOTE — Telephone Encounter (Signed)
Called [facility] per 10/26 sch message to schedule Doppler . Per Butch Penny to contact pt to see what works for patient before getting seen tomorrow .

## 2019-01-18 NOTE — Telephone Encounter (Signed)
Patient calls stating she cannot come for her appointments tomorrow 10/28 and wants them rescheduled for one day next week.  A high priority scheduling message has been sent.

## 2019-01-18 NOTE — Telephone Encounter (Signed)
Informed patient of her appointments which are rescheduled for 11/6.  She states she will come that date.  Her lower extremity area of concern is flat not swollen, denies pain.  Offered doppler of her leg and she declined at this time.

## 2019-01-19 ENCOUNTER — Other Ambulatory Visit: Payer: BC Managed Care – PPO

## 2019-01-19 ENCOUNTER — Ambulatory Visit: Payer: BC Managed Care – PPO

## 2019-01-19 ENCOUNTER — Ambulatory Visit: Payer: BC Managed Care – PPO | Admitting: Hematology

## 2019-01-19 ENCOUNTER — Ambulatory Visit: Payer: BC Managed Care – PPO | Admitting: Nurse Practitioner

## 2019-01-26 ENCOUNTER — Ambulatory Visit: Payer: BC Managed Care – PPO | Admitting: Nurse Practitioner

## 2019-01-26 ENCOUNTER — Other Ambulatory Visit: Payer: BC Managed Care – PPO

## 2019-01-26 ENCOUNTER — Ambulatory Visit: Payer: BC Managed Care – PPO

## 2019-01-27 NOTE — Progress Notes (Signed)
Beverly Schwartz   [phone] [phone]) [phone]   Clinic Follow up Note   Patient Care Team: Esaw Grandchild, NP as PCP - General (Family Medicine) Delrae Rend, MD as Consulting Physician (Endocrinology) Truitt Merle, MD as Consulting Physician (Hematology) Alla Feeling, NP as Nurse Practitioner (Nurse Practitioner) Clarene Essex, MD as Consulting Physician (Gastroenterology)  Date of Service:  01/28/2019  CHIEF COMPLAINT: Follow-up metastatic colon cancer  SUMMARY OF ONCOLOGIC HISTORY: Oncology History Overview Note  Cancer Staging Malignant neoplasm of rectosigmoid junction [facility]) Staging form: Colon and Rectum, AJCC 8th Edition - Clinical stage from 06/11/2017: Stage IVA (cTX, cN1b, pM1a) - Signed by Truitt Merle, MD on 06/15/2017     Malignant neoplasm of rectosigmoid junction ([place])  05/30/2017 Imaging   CT AP IMPRESSION: 1. 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 Abdominopelvic adenopathy. Rectosigmoid mass with suggestion of partial obstruction as evidenced by large colonic stool burden more proximally. 2.  Aortic Atherosclerosis (ICD10-I70.0).  This is age advanced.   05/31/2017 Tumor Marker   CEA 353.3 (elevated) AFP 4.5 (normal)   05/31/2017 Initial Biopsy   Diagnosis Colon, biopsy, sigmoid - INVASIVE ADENOCARCINOMA - SEE COMMENT   05/31/2017 Imaging   CT CHEST IMPRESSION: 1. No evidence of metastatic disease in the chest. 2. No acute findings.  Aortic Atherosclerosis (ICD10-I70.0).    05/31/2017 Procedure   Colonoscopy per Dr. Watt Climes Findings: An infiltrative and ulcerated partially obstructing medium-sized mass was found in the recto-sigmoid colon. The mass was circumferential. The mass measured four cm in length. No bleeding was present.   Impression - One small polyp in the rectum. - The examination was otherwise normal. - Malignant partially obstructing tumor in the  recto-sigmoid colon. Biopsied. - One medium polyp in the proximal sigmoid colon. - The examination was otherwise normal. - Internal hemorrhoids.   06/03/2017 Initial Diagnosis   Malignant neoplasm of transverse colon ([place])   06/11/2017 Cancer Staging   Staging form: Colon and Rectum, AJCC 8th Edition - Clinical stage from 06/11/2017: Stage IVA (cTX, cN1b, pM1a) - Signed by Truitt Merle, MD on 06/15/2017   06/11/2017 Pathology Results   Liver biopsy confirmed metastatic colon cancer   07/23/2017 Imaging   CT CAP IMPRESSION: 1. Mild progression of hepatic metastasis. 2. Similar rectosigmoid primary with abdominopelvic nodal metastasis. 3.  No acute process or evidence of metastatic disease in the chest. 4. Aortic Atherosclerosis (ICD10-I70.0). Coronary artery atherosclerosis. This is age advanced. 5. Proximal colonic constipation, again suggesting a component of partial obstruction at the primary site. 6. Mild ascending aortic dilatation at 4.1 cm.   08/25/2017 - 05/10/2018 Chemotherapy   -Xeloda 1538m BID 1 week on and 1 week off starting 08/25/17. Due to her worsening hand-foot syndrome her dose was reduced to 15064min the AM and 100017mn the PM. Due to disease progression we swithced her to CAPOX  -Vectibix every 2 weeks starting 08/25/17. Due to skin rash will stop starting 05/10/18.     12/14/2017 Imaging   IMPRESSION: 1. Response to therapy. Marked decrease in hepatic metastatic burden. More mild decrease in abdominopelvic adenopathy and definition of rectosigmoid primary. 2.  No acute process or evidence of metastatic disease in the chest. 3.  Aortic Atherosclerosis (ICD10-I70.0).    04/28/2018 Imaging   CT CAP 04/28/18  IMPRESSION: 1. Index liver metastases measured on the previous study show no substantial interval change although new liver lesions on today's exam are concerning for progressive disease. 2. Mild lymphadenopathy  in the upper abdomen is stable. 3. Persistent mild  ill-defined wall thickening in the distal sigmoid colon near the rectosigmoid junction. 4.  Aortic Atherosclerois (ICD10-170.0)    05/17/2018 - 12/02/2018 Chemotherapy   CAPOX every 3 weeks with Xeloda 1560m BID 2 weeks on/1week off starting 05/17/18 -Add Avastin to first cycle.    09/09/2018 Imaging   CT CAP W Contrast IMPRESSION: 1. Slight interval decrease in size one of the right hepatic lobe lesions. Additional lesions within the liver are similar when compared to recent prior exam. 2. Mild adenopathy within the abdomen is stable. 3. Similar-appearing thick walled distal sigmoid colon.   12/20/2018 Imaging   CT CAP W Contrast  IMPRESSION: 1. Increase in size and number of hepatic metastatic lesions compared to the prior study. 2. Stable adenopathy in the abdomen. 3. Signs of sigmoid wall thickening as before.   01/04/2019 -  Chemotherapy   FOLFIRI and Avastin q2weeks starting 01/04/19. The start of 5FU is being held for now.       CURRENT THERAPY:  FOLFIRI and Avastin q2weeks starting 01/04/19. The start of 5FU is being held for now.   INTERVAL HISTORY:  Beverly MIDDLEKAUFFis here for a follow up. She presents to the clinic alone. She notes she is doing fair. She notes more hair loss so she shaved her head. She notes 1 episode of temporary abdominal pain. She took Advil for this. She notes her BM are normal. She notes her appetite is fair but eats small meals. She notes she has been taking her HTN medications as prescribed.     REVIEW OF SYSTEMS:   Constitutional: Denies fevers, chills or abnormal weight loss Eyes: Denies blurriness of vision Ears, nose, mouth, throat, and face: Denies mucositis or sore throat Respiratory: Denies cough, dyspnea or wheezes Cardiovascular: Denies palpitation, chest discomfort or lower extremity swelling Gastrointestinal:  Denies nausea, heartburn or change in bowel habits Skin: Denies abnormal skin rashes Lymphatics: Denies new  lymphadenopathy or easy bruising Neurological:Denies numbness, tingling or new weaknesses Behavioral/Psych: Mood is stable, no new changes  All other systems were reviewed with the patient and are negative.  MEDICAL HISTORY:  Past Medical History:  Diagnosis Date  . Anxiety   . Cancer ([place]    colon, liver  . Chest pain 10/18/2018   Patient c/o chest pain right side yesterday intermitten lasting 3-4 hours.  KRowe Robert PA notified  . Colon cancer ([place]   . Complication of anesthesia   . Depression   . Diabetes mellitus   . Dizziness   . Family history of breast cancer   . Family history of stomach cancer   . Hyperlipidemia   . Hypertension   . Hypothyroidism   . Obesity   . PONV (postoperative nausea and vomiting)   . Tachycardia     SURGICAL HISTORY: Past Surgical History:  Procedure Laterality Date  . FLEXIBLE SIGMOIDOSCOPY N/A 05/31/2017   Procedure: FLEXIBLE SIGMOIDOSCOPY;  Surgeon: MClarene Essex MD;  Location: [facility] END[facility]COPY;  Service: Endoscopy;  Laterality: N/A;  . IR IMAGING GUIDED PORT INSERTION  10/19/2018  . WISDOM TOOTH EXTRACTION     age 48's   I have reviewed the social history and family history with the patient and they are unchanged from previous note.  ALLERGIES:  is allergic to bupropion and amoxicillin.  MEDICATIONS:  Current Outpatient Medications  Medication Sig Dispense Refill  . acetaminophen-codeine (TYLENOL #3) 300-30 MG tablet Take 1 tablet by mouth every 6 (six) hours  as needed for moderate pain. 30 tablet 0  . albuterol (PROVENTIL HFA;VENTOLIN HFA) 108 (90 Base) MCG/ACT inhaler Inhale 2 puffs into the lungs every 6 (six) hours as needed for wheezing or shortness of breath. 1 Inhaler 0  . ALPRAZolam (XANAX) 1 MG tablet TAKE 1 TABLET BY MOUTH AT BEDTIME AS NEEDED FOR ANXIETY 30 tablet 0  . amLODipine (NORVASC) 10 MG tablet Take 1 tablet (10 mg total) by mouth daily. 90 tablet 0  . benzonatate (TESSALON) 100 MG capsule Take 1 capsule (100 mg  total) by mouth 3 (three) times daily as needed for cough. 20 capsule 0  . cetirizine (ZYRTEC ALLERGY) 10 MG tablet Take 1 tablet (10 mg total) by mouth daily. 30 tablet 1  . CINNAMON PO Take 1 tablet by mouth daily.     . clindamycin (CLINDAGEL) 1 % gel APPLY TO AFFECTED AREA TWICE A DAY 60 g 1  . diphenoxylate-atropine (LOMOTIL) 2.5-0.025 MG tablet Take 1-2 tablets by mouth 4 (four) times daily as needed for diarrhea or loose stools. 45 tablet 1  . fluticasone (FLONASE) 50 MCG/ACT nasal spray Place 1 spray into both nostrils daily. (Patient taking differently: Place 1 spray into both nostrils daily as needed for allergies. ) 16 g 2  . gabapentin (NEURONTIN) 100 MG capsule Take 1 capsule (100 mg total) by mouth 3 (three) times daily. If tolerates well, OK to increase to 384m at night in 2 weeks 90 capsule 0  . insulin NPH-regular Human (NOVOLIN 70/30) (70-30) 100 UNIT/ML injection INJECT 10 UNITS WITH BREAKFAST, 10 UNITS WITH DINNER 10 mL 2  . Insulin Syringe-Needle U-100 (BD INSULIN SYRINGE U/F) 31G X 5/16" 1 ML MISC Inject twice a day as directed 200 each 3  . levothyroxine (SYNTHROID, LEVOTHROID) 112 MCG tablet Take 1 tablet (112 mcg total) by mouth daily before breakfast. 90 tablet 3  . lidocaine-prilocaine (EMLA) cream APPLY 1 APPLICATION TOPICALLY AS NEEDED. 30 g 0  . lisinopril (ZESTRIL) 5 MG tablet Take 1 tablet (5 mg total) by mouth daily. 90 tablet 0  . mirtazapine (REMERON) 7.5 MG tablet TAKE 1 TABLET (7.5 MG TOTAL) BY MOUTH AT BEDTIME. 90 tablet 1  . Omega-3 Fatty Acids (FISH OIL) 1000 MG CAPS Take 1,000 mg by mouth daily.     . polyethylene glycol (MIRALAX / GLYCOLAX) packet Take 17 g by mouth daily. 14 each 0  . prochlorperazine (COMPAZINE) 10 MG tablet Take 1 tablet (10 mg total) by mouth every 6 (six) hours as needed (NAUSEA). 30 tablet 1  . TURMERIC PO Take 1 capsule by mouth daily.      No current facility-administered medications for this visit.    Facility-Administered  Medications Ordered in Other Visits  Medication Dose Route Frequency Provider Last Rate Last Dose  . 0.9 %  sodium chloride infusion   Intravenous Continuous FTruitt Merle MD   Stopped at 11/11/18 1330  . 0.9 %  sodium chloride infusion   Intravenous Once FTruitt Merle MD      . atropine injection 0.5 mg  0.5 mg Intravenous Once PRN FTruitt Merle MD      . bevacizumab-bvzr (ZIRABEV) 400 mg in sodium chloride 0.9 % 100 mL chemo infusion  5 mg/kg (Treatment Plan Recorded) Intravenous Once FTruitt Merle MD      . heparin lock flush 100 unit/mL  500 Units Intracatheter Once PRN FTruitt Merle MD      . irinotecan (CAMPTOSAR) 340 mg in dextrose 5 % 500 mL chemo infusion  180 mg/m2 (Treatment Plan Recorded) Intravenous Once Truitt Merle, MD      . sodium chloride flush (NS) 0.9 % injection 10 mL  10 mL Intracatheter PRN Truitt Merle, MD        PHYSICAL EXAMINATION: ECOG PERFORMANCE STATUS: 1 - Symptomatic but completely ambulatory  Vitals:   01/28/19 1012  BP: (!) 155/88  Pulse: (!) 108  Resp: 18  Temp: 98.7 F (37.1 C)  SpO2: 100%   Filed Weights   01/28/19 1012  Weight: 163 lb 12.8 oz (74.3 kg)    GENERAL:alert, no distress and comfortable SKIN: skin color, texture, turgor are normal, no rashes or significant lesions EYES: normal, Conjunctiva are pink and non-injected, sclera clear  NECK: supple, thyroid normal size, non-tender, without nodularity LYMPH:  no palpable lymphadenopathy in the cervical, axillary  LUNGS: clear to auscultation and percussion with normal breathing effort HEART: regular rate & rhythm and no murmurs (+) Mild lower extremity edema of ankles ABDOMEN:abdomen soft, non-tender and normal bowel sounds (+) LLQ and mainly RUQ tenderness Musculoskeletal:no cyanosis of digits and no clubbing  NEURO: alert & oriented x 3 with fluent speech, no focal motor/sensory deficits  LABORATORY DATA:  I have reviewed the data as listed CBC Latest Ref Rng & Units 01/28/2019 01/04/2019 12/20/2018  WBC  4.0 - 10.5 K/uL 8.6 7.6 7.1  Hemoglobin 12.0 - 15.0 g/dL 12.5 12.3 14.1  Hematocrit 36.0 - 46.0 % 38.7 36.7 42.5  Platelets 150 - 400 K/uL 233 155 166     CMP Latest Ref Rng & Units 01/28/2019 01/04/2019 12/20/2018  Glucose 70 - 99 mg/dL 228(H) 152(H) 184(H)  BUN 6 - 20 mg/dL _0 Creatinine 0.44 - 1.00 mg/dL 0.76 0.76 0.79  Sodium 135 - 145 mmol/L 140 144 139  Potassium 3.5 - 5.1 mmol/L 4.5 3.8 4.2  Chloride 98 - 111 mmol/L 106 106 107  CO2 22 - 32 mmol/L _1 Calcium 8.9 - 10.3 mg/dL 7.6(L) 8.3(L) 8.4(L)  Total Protein 6.5 - 8.1 g/dL 7.2 6.8 7.4  Total Bilirubin 0.3 - 1.2 mg/dL 0.3 0.4 0.4  Alkaline Phos 38 - 126 U/L 191(H) 186(H) 185(H)  AST 15 - 41 U/L _2 ALT 0 - 44 U/L _3 RADIOGRAPHIC STUDIES: I have personally reviewed the radiological images as listed and agreed with the findings in the report. No results found.   ASSESSMENT & PLAN:  Beverly Schwartz is a 48 y.o. female with   1. Sigmoid adenocarcinoma, with abdominopelvic adenopathy andhepatic metastasis, stage IV, MSI-stable , KRAS/NRAS/BRAF wild type -Diagnosed in 05/2017. Patient declined intensive chemo regiments previously due to fear of side effects.She progressed on first line XelodaandVectibixafter 8 months of therapy. -She had PAC placed on 10/19/18.  -She recently progressed on second lineCAPOX and Avastin. She tolerated poorly due to hand foot syndrome and Neuropathy.  -I started her on third-line FOLFIRI and Avastin 01/04/19. Dose was reduced due to neuropathy. Will hold start of 5FU until she moves into her home, per pt request.  -She tolerated first cycle Irinotecan and Avastin well with hair loss 1 episode of RUQ pain. Normal BM, stable eating and energy. Her neuropathy is mainly in her feet now and skin rash resolved.  -On exam she has tenderness of RUQ, she is concerned about gall bladder, I think it's probably related to her liver mets, I suggest [place] to further evaluate.  She is agreeable.  -Labs reviewed and  adequate to proceed with C2 Irinotecan and Avastin today at full dose.  -I encouraged her to consider adding 5-fu pump infusion to the regimen, she wants to wait until she moves in to her new house which is next to her mother's house next month    2. DM, HTN, HL -Continue medications (Amlidapine, lisinopril) and f/u with PCP. Will closely monitor BP on Avastin.   3. Financial and Social issues, Anxiety, depression -Shefollows up as needed withSocial Worker Hollice Espy -She is currently on Xanax  -Shehas regained insurancewithBlue cross insurancein 2020  4.Goal of care discussion  -She understands the goal of care is palliative, her cancer is incurable. The goal of therapy is to prolong her life and improve her quality of life. -She is full code for now   5.Insomnia, anxiety  -She has had trouble sleeping from anxiety and allergies  -She has Xanax which she uses as needed.  6. 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    Plan -Labs reviewed and adequate to proceed with C2 irinotecan and Avastin at full dose today  -[place] on 11[place]9 in the AM -Lab, flush, f/u and irinotecan and Avastin in 2 and 4 weeks    No problem-specific Assessment & Plan notes found for this encounter.   Orders Placed This Encounter  Procedures  . [place]dom[place] Complete    Standing Status:   Future    Standing Expiration Date:   01/28/2020    Order Specific Question:   Reason for Exam (SYMPTOM  OR DIAGNOSIS REQUIRED)    Answer:   abdominal pain    Order Specific Question:   [facility]    [facility]  All questions were answered. The patient knows to call the clinic with any problems, questions or concerns. No barriers to learning was detected. I spent 20 minutes  counseling the patient face to face. The total time spent in the appointment was 25 minutes and more than 50% was on counseling and review of test results     YTruitt Merle MD 01/28/2019   I, AJoslyn Devon am acting as scribe for YTruitt Merle MD.   I have reviewed the above documentation for accuracy and completeness, and I agree with the above.

## 2019-01-28 ENCOUNTER — Inpatient Hospital Stay (HOSPITAL_BASED_OUTPATIENT_CLINIC_OR_DEPARTMENT_OTHER): Payer: BC Managed Care – PPO | Admitting: Hematology

## 2019-01-28 ENCOUNTER — Other Ambulatory Visit: Payer: Self-pay

## 2019-01-28 ENCOUNTER — Inpatient Hospital Stay: Payer: BC Managed Care – PPO | Attending: Hematology

## 2019-01-28 ENCOUNTER — Encounter: Payer: Self-pay | Admitting: Hematology

## 2019-01-28 ENCOUNTER — Inpatient Hospital Stay: Payer: BC Managed Care – PPO

## 2019-01-28 VITALS — BP 131/88 | HR 101

## 2019-01-28 VITALS — BP 155/88 | HR 108 | Temp 98.7°F | Resp 18 | Ht 65.0 in | Wt 163.8 lb

## 2019-01-28 DIAGNOSIS — C19 Malignant neoplasm of rectosigmoid junction: Secondary | ICD-10-CM | POA: Diagnosis not present

## 2019-01-28 DIAGNOSIS — I10 Essential (primary) hypertension: Secondary | ICD-10-CM | POA: Insufficient documentation

## 2019-01-28 DIAGNOSIS — C787 Secondary malignant neoplasm of liver and intrahepatic bile duct: Secondary | ICD-10-CM | POA: Diagnosis not present

## 2019-01-28 DIAGNOSIS — Z5111 Encounter for antineoplastic chemotherapy: Secondary | ICD-10-CM | POA: Insufficient documentation

## 2019-01-28 DIAGNOSIS — Z79899 Other long term (current) drug therapy: Secondary | ICD-10-CM | POA: Insufficient documentation

## 2019-01-28 DIAGNOSIS — G620 Drug-induced polyneuropathy: Secondary | ICD-10-CM | POA: Diagnosis not present

## 2019-01-28 DIAGNOSIS — D500 Iron deficiency anemia secondary to blood loss (chronic): Secondary | ICD-10-CM

## 2019-01-28 DIAGNOSIS — E119 Type 2 diabetes mellitus without complications: Secondary | ICD-10-CM | POA: Insufficient documentation

## 2019-01-28 DIAGNOSIS — C187 Malignant neoplasm of sigmoid colon: Secondary | ICD-10-CM | POA: Diagnosis not present

## 2019-01-28 DIAGNOSIS — Z5112 Encounter for antineoplastic immunotherapy: Secondary | ICD-10-CM | POA: Diagnosis present

## 2019-01-28 DIAGNOSIS — Z95828 Presence of other vascular implants and grafts: Secondary | ICD-10-CM

## 2019-01-28 DIAGNOSIS — I700 Atherosclerosis of aorta: Secondary | ICD-10-CM | POA: Insufficient documentation

## 2019-01-28 DIAGNOSIS — Z7189 Other specified counseling: Secondary | ICD-10-CM

## 2019-01-28 LAB — CBC WITH DIFFERENTIAL (CANCER CENTER ONLY)
Abs Immature Granulocytes: 0.04 10*3/uL (ref 0.00–0.07)
Basophils Absolute: 0.1 10*3/uL (ref 0.0–0.1)
Basophils Relative: 1 %
Eosinophils Absolute: 0.2 10*3/uL (ref 0.0–0.5)
Eosinophils Relative: 2 %
HCT: 38.7 % (ref 36.0–46.0)
Hemoglobin: 12.5 g/dL (ref 12.0–15.0)
Immature Granulocytes: 1 %
Lymphocytes Relative: 21 %
Lymphs Abs: 1.8 10*3/uL (ref 0.7–4.0)
MCH: 30 pg (ref 26.0–34.0)
MCHC: 32.3 g/dL (ref 30.0–36.0)
MCV: 92.8 fL (ref 80.0–100.0)
Monocytes Absolute: 0.5 10*3/uL (ref 0.1–1.0)
Monocytes Relative: 6 %
Neutro Abs: 5.9 10*3/uL (ref 1.7–7.7)
Neutrophils Relative %: 69 %
Platelet Count: 233 10*3/uL (ref 150–400)
RBC: 4.17 MIL/uL (ref 3.87–5.11)
RDW: 16.5 % — ABNORMAL HIGH (ref 11.5–15.5)
WBC Count: 8.6 10*3/uL (ref 4.0–10.5)
nRBC: 0 % (ref 0.0–0.2)

## 2019-01-28 LAB — CEA (IN HOUSE-CHCC): CEA (CHCC-In House): 510.9 ng/mL — ABNORMAL HIGH (ref 0.00–5.00)

## 2019-01-28 LAB — CMP (CANCER CENTER ONLY)
ALT: 14 U/L (ref 0–44)
AST: 24 U/L (ref 15–41)
Albumin: 3.5 g/dL (ref 3.5–5.0)
Alkaline Phosphatase: 191 U/L — ABNORMAL HIGH (ref 38–126)
Anion gap: 11 (ref 5–15)
BUN: 10 mg/dL (ref 6–20)
CO2: 23 mmol/L (ref 22–32)
Calcium: 7.6 mg/dL — ABNORMAL LOW (ref 8.9–10.3)
Chloride: 106 mmol/L (ref 98–111)
Creatinine: 0.76 mg/dL (ref 0.44–1.00)
GFR, Est AFR Am: >60 mL/min (ref >60)
GFR, Estimated: >60 mL/min (ref >60)
Glucose, Bld: 228 mg/dL — ABNORMAL HIGH (ref 70–99)
Potassium: 4.5 mmol/L (ref 3.5–5.1)
Sodium: 140 mmol/L (ref 135–145)
Total Bilirubin: 0.3 mg/dL (ref 0.3–1.2)
Total Protein: 7.2 g/dL (ref 6.5–8.1)

## 2019-01-28 LAB — IRON AND TIBC
Iron: 36 ug/dL — ABNORMAL LOW (ref 41–142)
Saturation Ratios: 10 % — ABNORMAL LOW (ref 21–57)
TIBC: 344 ug/dL (ref 236–444)
UIBC: 308 ug/dL (ref 120–384)

## 2019-01-28 LAB — FERRITIN: Ferritin: 56 ng/mL (ref 11–307)

## 2019-01-28 LAB — TOTAL PROTEIN, URINE DIPSTICK: Protein, ur: NEGATIVE mg/dL

## 2019-01-28 MED ORDER — IRINOTECAN HCL CHEMO INJECTION 100 MG/5ML
180.0000 mg/m2 | Freq: Once | INTRAVENOUS | Status: AC
  Administered 2019-01-28: 340 mg via INTRAVENOUS
  Filled 2019-01-28: qty 15

## 2019-01-28 MED ORDER — DEXAMETHASONE SODIUM PHOSPHATE 10 MG/ML IJ SOLN
10.0000 mg | Freq: Once | INTRAMUSCULAR | Status: AC
  Administered 2019-01-28: 10 mg via INTRAVENOUS

## 2019-01-28 MED ORDER — SODIUM CHLORIDE 0.9 % IV SOLN
Freq: Once | INTRAVENOUS | Status: AC
  Administered 2019-01-28: 11:00:00 via INTRAVENOUS
  Filled 2019-01-28: qty 250

## 2019-01-28 MED ORDER — HEPARIN SOD (PORK) LOCK FLUSH 100 UNIT/ML IV SOLN
500.0000 [IU] | Freq: Once | INTRAVENOUS | Status: AC | PRN
  Administered 2019-01-28: 500 [IU]
  Filled 2019-01-28: qty 5

## 2019-01-28 MED ORDER — ATROPINE SULFATE 1 MG/ML IJ SOLN
INTRAMUSCULAR | Status: AC
  Filled 2019-01-28: qty 1

## 2019-01-28 MED ORDER — PALONOSETRON HCL INJECTION 0.25 MG/5ML
0.2500 mg | Freq: Once | INTRAVENOUS | Status: AC
  Administered 2019-01-28: 0.25 mg via INTRAVENOUS

## 2019-01-28 MED ORDER — SODIUM CHLORIDE 0.9 % IV SOLN
5.0000 mg/kg | Freq: Once | INTRAVENOUS | Status: AC
  Administered 2019-01-28: 400 mg via INTRAVENOUS
  Filled 2019-01-28: qty 16

## 2019-01-28 MED ORDER — SODIUM CHLORIDE 0.9% FLUSH
10.0000 mL | INTRAVENOUS | Status: DC | PRN
  Administered 2019-01-28: 10 mL
  Filled 2019-01-28: qty 10

## 2019-01-28 MED ORDER — SODIUM CHLORIDE 0.9% FLUSH
10.0000 mL | INTRAVENOUS | Status: DC | PRN
  Administered 2019-01-28: 10 mL via INTRAVENOUS
  Filled 2019-01-28: qty 10

## 2019-01-28 MED ORDER — DEXAMETHASONE SODIUM PHOSPHATE 10 MG/ML IJ SOLN
INTRAMUSCULAR | Status: AC
  Filled 2019-01-28: qty 1

## 2019-01-28 MED ORDER — PALONOSETRON HCL INJECTION 0.25 MG/5ML
INTRAVENOUS | Status: AC
  Filled 2019-01-28: qty 5

## 2019-01-28 MED ORDER — ATROPINE SULFATE 1 MG/ML IJ SOLN
0.5000 mg | Freq: Once | INTRAMUSCULAR | Status: AC | PRN
  Administered 2019-01-28: 0.5 mg via INTRAVENOUS

## 2019-01-28 MED ORDER — SODIUM CHLORIDE 0.9 % IV SOLN
Freq: Once | INTRAVENOUS | Status: DC
  Filled 2019-01-28: qty 250

## 2019-01-28 NOTE — Progress Notes (Signed)
Per Dr Burr Medico OK to proceed with treatment with HR > 100

## 2019-01-28 NOTE — Patient Instructions (Signed)
[facility] Discharge Instructions for Patients Receiving Chemotherapy  Today you received the following chemotherapy agents:  Bevacizumab, Irinotecan  To help prevent nausea and vomiting after your treatment, we encourage you to take your nausea medication as prescribed.   If you develop nausea and vomiting that is not controlled by your nausea medication, call the clinic.   BELOW ARE SYMPTOMS THAT SHOULD BE REPORTED IMMEDIATELY:  *FEVER GREATER THAN 100.5 F  *CHILLS WITH OR WITHOUT FEVER  NAUSEA AND VOMITING THAT IS NOT CONTROLLED WITH YOUR NAUSEA MEDICATION  *UNUSUAL SHORTNESS OF BREATH  *UNUSUAL BRUISING OR BLEEDING  TENDERNESS IN MOUTH AND THROAT WITH OR WITHOUT PRESENCE OF ULCERS  *URINARY PROBLEMS  *BOWEL PROBLEMS  UNUSUAL RASH Items with * indicate a potential emergency and should be followed up as soon as possible.  Feel free to call the clinic should you have any questions or concerns. The clinic phone number is [phone].  Please show the [facility] at check-in to the Emergency Department and triage nurse.

## 2019-01-31 ENCOUNTER — Other Ambulatory Visit: Payer: Self-pay

## 2019-01-31 ENCOUNTER — Ambulatory Visit (HOSPITAL_COMMUNITY)
Admission: RE | Admit: 2019-01-31 | Discharge: 2019-01-31 | Disposition: A | Discharge status: Home / Self Care | Payer: BC Managed Care – PPO | Source: Ambulatory Visit | Attending: Hematology | Admitting: Hematology

## 2019-01-31 DIAGNOSIS — C19 Malignant neoplasm of rectosigmoid junction: Secondary | ICD-10-CM | POA: Diagnosis not present

## 2019-02-01 ENCOUNTER — Encounter: Payer: Self-pay | Admitting: Adult Health

## 2019-02-01 ENCOUNTER — Encounter: Payer: Self-pay | Admitting: Nurse Practitioner

## 2019-02-03 ENCOUNTER — Ambulatory Visit: Payer: BC Managed Care – PPO | Admitting: Nurse Practitioner

## 2019-02-03 ENCOUNTER — Other Ambulatory Visit: Payer: BC Managed Care – PPO

## 2019-02-03 ENCOUNTER — Ambulatory Visit: Payer: BC Managed Care – PPO

## 2019-02-06 ENCOUNTER — Other Ambulatory Visit: Payer: Self-pay | Admitting: Hematology

## 2019-02-09 ENCOUNTER — Telehealth: Payer: Self-pay | Admitting: Nurse Practitioner

## 2019-02-09 ENCOUNTER — Encounter: Payer: Self-pay | Admitting: Hematology

## 2019-02-09 NOTE — Telephone Encounter (Signed)
I called Beverly Schwartz in response to her MyChart request to cancel appts next week. I reviewed the rationale for staying on treatment schedule and encouraged her to consider keeping the appointment. She agrees to come in for lab, f/u, and avastin only which is generally more tolerable than avastin/irinotecan together. She does not want to feel sick over Thanksgiving which is understandable. She requests later appointments that day, it is hard to be here by 8 am. Will send a message to my scheduler. She verbalizes understanding and appreciates the call.

## 2019-02-10 ENCOUNTER — Telehealth: Payer: Self-pay | Admitting: Nurse Practitioner

## 2019-02-10 NOTE — Telephone Encounter (Signed)
R/s appt per 11/18 sch message - unable to reach pt . Left message with new appt date and time

## 2019-02-13 ENCOUNTER — Encounter: Payer: Self-pay | Admitting: Nurse Practitioner

## 2019-02-14 ENCOUNTER — Ambulatory Visit: Payer: BC Managed Care – PPO

## 2019-02-14 ENCOUNTER — Ambulatory Visit: Payer: BC Managed Care – PPO | Admitting: Hematology

## 2019-02-14 ENCOUNTER — Other Ambulatory Visit: Payer: BC Managed Care – PPO

## 2019-02-14 ENCOUNTER — Inpatient Hospital Stay: Payer: BC Managed Care – PPO

## 2019-02-14 ENCOUNTER — Inpatient Hospital Stay: Payer: BC Managed Care – PPO | Admitting: Nurse Practitioner

## 2019-02-15 ENCOUNTER — Encounter: Payer: Self-pay | Admitting: Hematology

## 2019-02-15 ENCOUNTER — Telehealth: Payer: Self-pay

## 2019-02-15 NOTE — Telephone Encounter (Signed)
Received call from Dr. French Ana stating he injected her elbow but thinks she is developing cellulitis.  He has put her on Doxycycline and is asking that we check on her during this holiday week.  Dr. Burr Medico was made aware.  His cell [phone]

## 2019-02-16 ENCOUNTER — Telehealth: Payer: Self-pay

## 2019-02-16 ENCOUNTER — Encounter: Payer: Self-pay | Admitting: Hematology

## 2019-02-16 ENCOUNTER — Telehealth: Payer: Self-pay | Admitting: Hematology

## 2019-02-16 NOTE — Telephone Encounter (Signed)
Patient calls stating that she is having severe arm pain was seen by Dr. French Ana and it was injected, asking for pain medication.  She has not started taking the Doxycycline yet.  Consulted with Cira Rue NP and advised patient she needs to call Dr. Alvester Morin office related to her arm pain.  She verbalized an understanding.

## 2019-02-16 NOTE — Telephone Encounter (Signed)
Scheduled appt per 11/23 sch message - pt is aware of appt date and time   

## 2019-02-21 ENCOUNTER — Ambulatory Visit: Payer: BC Managed Care – PPO

## 2019-02-21 ENCOUNTER — Other Ambulatory Visit: Payer: BC Managed Care – PPO

## 2019-02-21 ENCOUNTER — Inpatient Hospital Stay: Payer: BC Managed Care – PPO

## 2019-02-21 ENCOUNTER — Inpatient Hospital Stay: Payer: BC Managed Care – PPO | Admitting: Nurse Practitioner

## 2019-02-21 ENCOUNTER — Telehealth: Payer: Self-pay | Admitting: Hematology

## 2019-02-21 NOTE — Telephone Encounter (Signed)
Returned patient's phone call regarding cancelling 11/30 appointments, patient is waiting for test results to come back and will reschedule based on that.  Message to provider.

## 2019-02-24 ENCOUNTER — Encounter: Payer: Self-pay | Admitting: Nurse Practitioner

## 2019-02-24 ENCOUNTER — Inpatient Hospital Stay: Payer: BC Managed Care – PPO | Attending: Hematology | Admitting: Hematology

## 2019-02-24 ENCOUNTER — Encounter: Payer: Self-pay | Admitting: Hematology

## 2019-02-24 DIAGNOSIS — C19 Malignant neoplasm of rectosigmoid junction: Secondary | ICD-10-CM | POA: Diagnosis not present

## 2019-02-24 NOTE — Progress Notes (Signed)
[place]   [phone] [phone]   Clinic Follow up Note   Patient Care Team: Esaw Grandchild, NP as PCP - General (Family Medicine) Delrae Rend, MD as Consulting Physician (Endocrinology) Truitt Merle, MD as Consulting Physician (Hematology) Alla Feeling, NP as Nurse Practitioner (Nurse Practitioner) Clarene Essex, MD as Consulting Physician (Gastroenterology)   I connected with Beverly Schwartz on 02/24/2019 at  1:40 PM EST by video enabled telemedicine visit and verified that I am speaking with the correct person using two identifiers.  I discussed the limitations, risks, security and privacy concerns of performing an evaluation and management service by telephone and the availability of in person appointments. I also discussed with the patient that there may be a patient responsible charge related to this service. The patient expressed understanding and agreed to proceed.   Patient's location:  Her home  Provider's location:  My Office   CHIEF COMPLAINT: Follow-up metastatic colon cancer   SUMMARY OF ONCOLOGIC HISTORY: Oncology History Overview Note  Cancer Staging Malignant neoplasm of rectosigmoid junction [facility]) Staging form: Colon and Rectum, AJCC 8th Edition - Clinical stage from 06/11/2017: Stage IVA (cTX, cN1b, pM1a) - Signed by Truitt Merle, MD on 06/15/2017     Malignant neoplasm of rectosigmoid junction ([place])  05/30/2017 Imaging   CT AP IMPRESSION: 1. 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 Abdominopelvic adenopathy. Rectosigmoid mass with suggestion of partial obstruction as evidenced by large colonic stool burden more proximally. 2.  Aortic Atherosclerosis (ICD10-I70.0).  This is age advanced.   05/31/2017 Tumor Marker   CEA 353.3 (elevated) AFP 4.5 (normal)   05/31/2017 Initial Biopsy   Diagnosis Colon, biopsy, sigmoid - INVASIVE ADENOCARCINOMA - SEE COMMENT  05/31/2017 Imaging   CT CHEST IMPRESSION: 1. No evidence of metastatic disease in the chest. 2. No acute findings.  Aortic Atherosclerosis (ICD10-I70.0).    05/31/2017 Procedure   Colonoscopy per Dr. Watt Climes Findings: An infiltrative and ulcerated partially obstructing medium-sized mass was found in the recto-sigmoid colon. The mass was circumferential. The mass measured four cm in length. No bleeding was present.   Impression - One small polyp in the rectum. - The examination was otherwise normal. - Malignant partially obstructing tumor in the recto-sigmoid colon. Biopsied. - One medium polyp in the proximal sigmoid colon. - The examination was otherwise normal. - Internal hemorrhoids.   06/03/2017 Initial Diagnosis   Malignant neoplasm of transverse colon ([place])   06/11/2017 Cancer Staging   Staging form: Colon and Rectum, AJCC 8th Edition - Clinical stage from 06/11/2017: Stage IVA (cTX, cN1b, pM1a) - Signed by Truitt Merle, MD on 06/15/2017   06/11/2017 Pathology Results   Liver biopsy confirmed metastatic colon cancer   07/23/2017 Imaging   CT CAP IMPRESSION: 1. Mild progression of hepatic metastasis. 2. Similar rectosigmoid primary with abdominopelvic nodal metastasis. 3.  No acute process or evidence of metastatic disease in the chest. 4. Aortic Atherosclerosis (ICD10-I70.0). Coronary artery atherosclerosis. This is age advanced. 5. Proximal colonic constipation, again suggesting a component of partial obstruction at the primary site. 6. Mild ascending aortic dilatation at 4.1 cm.   08/25/2017 - 05/10/2018 Chemotherapy   -Xeloda '1500mg'$  BID 1 week on and 1 week off starting 08/25/17. Due to her worsening hand-foot syndrome her dose was reduced to '1500mg'$  in the AM and '1000mg'$  in the PM. Due to disease progression we swithced her to CAPOX  -Vectibix every 2 weeks starting 08/25/17. Due to skin rash will  stop starting 05/10/18.     12/14/2017 Imaging   IMPRESSION: 1. Response to therapy.  Marked decrease in hepatic metastatic burden. More mild decrease in abdominopelvic adenopathy and definition of rectosigmoid primary. 2.  No acute process or evidence of metastatic disease in the chest. 3.  Aortic Atherosclerosis (ICD10-I70.0).    04/28/2018 Imaging   CT CAP 04/28/18  IMPRESSION: 1. Index liver metastases measured on the previous study show no substantial interval change although new liver lesions on today's exam are concerning for progressive disease. 2. Mild lymphadenopathy in the upper abdomen is stable. 3. Persistent mild ill-defined wall thickening in the distal sigmoid colon near the rectosigmoid junction. 4.  Aortic Atherosclerois (ICD10-170.0)    05/17/2018 - 12/02/2018 Chemotherapy   CAPOX every 3 weeks with Xeloda '1500mg'$  BID 2 weeks on/1week off starting 05/17/18 -Add Avastin to first cycle.    09/09/2018 Imaging   CT CAP W Contrast IMPRESSION: 1. Slight interval decrease in size one of the right hepatic lobe lesions. Additional lesions within the liver are similar when compared to recent prior exam. 2. Mild adenopathy within the abdomen is stable. 3. Similar-appearing thick walled distal sigmoid colon.   12/20/2018 Imaging   CT CAP W Contrast  IMPRESSION: 1. Increase in size and number of hepatic metastatic lesions compared to the prior study. 2. Stable adenopathy in the abdomen. 3. Signs of sigmoid wall thickening as before.   01/04/2019 -  Chemotherapy   FOLFIRI and Avastin q2weeks starting 01/04/19. The start of 5FU is being held for now.       CURRENT THERAPY:  FOLFIRI and Avastin q2weeks starting 01/04/19. The start of 5FU is being held for now.   INTERVAL HISTORY:  Beverly Schwartz is here for a follow up. She notes her new home plans to be complete this week and she will be moving soon. She notes she does not like current regimen. She feels irinotecan and Avastin makes her feel hot, sick and nauseous. This feeling would last 1 week before  she recovers. She had 2nd cycle was full dose and she tolerated worse. She cancelled chemo since then. She is considering stopping chemo altogether as she is tired of treatments and has concerns of COVID. She notes she does currently have RUQ pain. She is willing to discuss this further with her mother.  She note recent swelling of her right arm after injection with her endocrinologist. This is overall resolved now.    REVIEW OF SYSTEMS:   Constitutional: Denies fevers, chills or abnormal weight loss Eyes: Denies blurriness of vision Ears, nose, mouth, throat, and face: Denies mucositis or sore throat Respiratory: Denies cough, dyspnea or wheezes Cardiovascular: Denies palpitation, chest discomfort or lower extremity swelling Gastrointestinal:  Denies nausea, heartburn or change in bowel habits (+) RUQ pain  Skin: Denies abnormal skin rashes Lymphatics: Denies new lymphadenopathy or easy bruising Neurological:Denies numbness, tingling or new weaknesses Behavioral/Psych: Mood is stable, no new changes  All other systems were reviewed with the patient and are negative.  MEDICAL HISTORY:  Past Medical History:  Diagnosis Date  . Anxiety   . Cancer ([place])    colon, liver  . Chest pain 10/18/2018   Patient c/o chest pain right side yesterday intermitten lasting 3-4 hours.  [name], PA notified  . Colon cancer ([place])   . Complication of anesthesia   . Depression   . Diabetes mellitus   . Dizziness   . Family history of breast cancer   . Family history  of stomach cancer   . Hyperlipidemia   . Hypertension   . Hypothyroidism   . Obesity   . PONV (postoperative nausea and vomiting)   . Tachycardia     SURGICAL HISTORY: Past Surgical History:  Procedure Laterality Date  . FLEXIBLE SIGMOIDOSCOPY N/A 05/31/2017   Procedure: FLEXIBLE SIGMOIDOSCOPY;  Surgeon: Clarene Essex, MD;  Location: [facility];  Service: Endoscopy;  Laterality: N/A;  . IR IMAGING GUIDED PORT INSERTION   10/19/2018  . WISDOM TOOTH EXTRACTION     age 28's    I have reviewed the social history and family history with the patient and they are unchanged from previous note.  ALLERGIES:  is allergic to bupropion and amoxicillin.  MEDICATIONS:  Current Outpatient Medications  Medication Sig Dispense Refill  . acetaminophen-codeine (TYLENOL #3) 300-30 MG tablet Take 1 tablet by mouth every 6 (six) hours as needed for moderate pain. 30 tablet 0  . albuterol (PROVENTIL HFA;VENTOLIN HFA) 108 (90 Base) MCG/ACT inhaler Inhale 2 puffs into the lungs every 6 (six) hours as needed for wheezing or shortness of breath. 1 Inhaler 0  . ALPRAZolam (XANAX) 1 MG tablet TAKE 1 TABLET BY MOUTH AT BEDTIME AS NEEDED FOR ANXIETY 30 tablet 0  . amLODipine (NORVASC) 10 MG tablet Take 1 tablet (10 mg total) by mouth daily. 90 tablet 0  . benzonatate (TESSALON) 100 MG capsule Take 1 capsule (100 mg total) by mouth 3 (three) times daily as needed for cough. 20 capsule 0  . cetirizine (ZYRTEC ALLERGY) 10 MG tablet Take 1 tablet (10 mg total) by mouth daily. 30 tablet 1  . CINNAMON PO Take 1 tablet by mouth daily.     . clindamycin (CLINDAGEL) 1 % gel APPLY TO AFFECTED AREA TWICE A DAY 60 g 1  . diphenoxylate-atropine (LOMOTIL) 2.5-0.025 MG tablet Take 1-2 tablets by mouth 4 (four) times daily as needed for diarrhea or loose stools. 45 tablet 1  . fluticasone (FLONASE) 50 MCG/ACT nasal spray Place 1 spray into both nostrils daily. (Patient taking differently: Place 1 spray into both nostrils daily as needed for allergies. ) 16 g 2  . gabapentin (NEURONTIN) 100 MG capsule Take 1 capsule (100 mg total) by mouth 3 (three) times daily. If tolerates well, OK to increase to '300mg'$  at night in 2 weeks 90 capsule 0  . insulin NPH-regular Human (NOVOLIN 70/30) (70-30) 100 UNIT/ML injection INJECT 10 UNITS WITH BREAKFAST, 10 UNITS WITH DINNER 10 mL 2  . Insulin Syringe-Needle U-100 (BD INSULIN SYRINGE U/F) 31G X 5/16" 1 ML MISC Inject  twice a day as directed 200 each 3  . levothyroxine (SYNTHROID, LEVOTHROID) 112 MCG tablet Take 1 tablet (112 mcg total) by mouth daily before breakfast. 90 tablet 3  . lidocaine-prilocaine (EMLA) cream APPLY 1 APPLICATION TOPICALLY AS NEEDED. 30 g 0  . lisinopril (ZESTRIL) 5 MG tablet Take 1 tablet (5 mg total) by mouth daily. 90 tablet 0  . mirtazapine (REMERON) 7.5 MG tablet TAKE 1 TABLET BY MOUTH AT BEDTIME. 90 tablet 1  . Omega-3 Fatty Acids (FISH OIL) 1000 MG CAPS Take 1,000 mg by mouth daily.     . polyethylene glycol (MIRALAX / GLYCOLAX) packet Take 17 g by mouth daily. 14 each 0  . prochlorperazine (COMPAZINE) 10 MG tablet Take 1 tablet (10 mg total) by mouth every 6 (six) hours as needed (NAUSEA). 30 tablet 1  . TURMERIC PO Take 1 capsule by mouth daily.      No current facility-administered medications  for this visit.    Facility-Administered Medications Ordered in Other Visits  Medication Dose Route Frequency Provider Last Rate Last Dose  . 0.9 %  sodium chloride infusion   Intravenous Continuous Truitt Merle, MD   Stopped at 11/11/18 1330    PHYSICAL EXAMINATION: ECOG PERFORMANCE STATUS: 2 - Symptomatic, <50% confined to bed  No vitals taken today, Exam not performed today   LABORATORY DATA:  I have reviewed the data as listed CBC Latest Ref Rng & Units 01/28/2019 01/04/2019 12/20/2018  WBC 4.0 - 10.5 K/uL 8.6 7.6 7.1  Hemoglobin 12.0 - 15.0 g/dL 12.5 12.3 14.1  Hematocrit 36.0 - 46.0 % 38.7 36.7 42.5  Platelets 150 - 400 K/uL 233 155 166     CMP Latest Ref Rng & Units 01/28/2019 01/04/2019 12/20/2018  Glucose 70 - 99 mg/dL 228(H) 152(H) 184(H)  BUN 6 - 20 mg/dL '10 7 11  '$ Creatinine 0.44 - 1.00 mg/dL 0.76 0.76 0.79  Sodium 135 - 145 mmol/L 140 144 139  Potassium 3.5 - 5.1 mmol/L 4.5 3.8 4.2  Chloride 98 - 111 mmol/L 106 106 107  CO2 22 - 32 mmol/L '23 25 24  '$ Calcium 8.9 - 10.3 mg/dL 7.6(L) 8.3(L) 8.4(L)  Total Protein 6.5 - 8.1 g/dL 7.2 6.8 7.4  Total Bilirubin 0.3 -  1.2 mg/dL 0.3 0.4 0.4  Alkaline Phos 38 - 126 U/L 191(H) 186(H) 185(H)  AST 15 - 41 U/L '24 25 23  '$ ALT 0 - 44 U/L '14 14 17      '$ RADIOGRAPHIC STUDIES: I have personally reviewed the radiological images as listed and agreed with the findings in the report. No results found.   ASSESSMENT & PLAN:  Beverly Schwartz is a 48 y.o. female with   1. Sigmoid adenocarcinoma, with abdominopelvic adenopathy andhepatic metastasis, stage IV, MSI-stable , KRAS/NRAS/BRAF wild type -Diagnosed in 05/2017. Patient declined intensive chemo regiments previously due to fear of side effects.She progressed on first line XelodaandVectibixafter 8 months of therapy. -She had PAC placed on 10/19/18.  -She recently progressed on second lineCAPOX and Avastin. She tolerated poorly due to hand foot syndrome and Neuropathy.  -I started her on third-line Irinotecan and Avastin 01/04/19. Dose was reduced due to neuropathy. I recommended FOLFIRI but pt wanted to hold start of 5FU until she moves into her new home in mid Dec -She tolerated dose reduced first cycle moderately well with nausea and feeling overall fatigued and hot but felt her full dose second cycle was much worse. She is apprehensive about continuing any chemo treatment.  -I discussed in great detail that we can reduce dose and frequency of this chemo regimen for her to better tolerate. I discussed without any treatment her cancer will continue to grow, her symptoms will progress and her survival time is short. She understands.  -She is willing to discuss this further with her mother at her next visit next Monday -She is also interested in Palliative home care assistance. I will send referral. Her new home on her mother's property is almost complete.    2. DM, HTN, HL -Continue medications (Amlodipine, lisinopril) and f/u with PCP. Will closely monitor BP on Avastin.   3. Financial and Social issues, Anxiety, depression -Shefollows up as needed  withSocial Worker Hollice Espy -She is currently on Xanax  -Shehas regained insurancewithBlue cross insurancein 2020 -she is more anxious lately due to the [place] pandemic. I will asked SW to f/u with her   4.Goal of care discussion  -She understands  the goal of care is palliative, her cancer is incurable. The goal of therapy is to prolong her life and improve her quality of life. -She is full code for now   5.Insomnia, anxiety  -She has had trouble sleeping from anxiety and allergies  -She has Xanax which she uses as needed. -She notes being more anxious lately from [place] and concerns of contracting this.  -I offered her the opportunity to speak with out SW, she is agreeable.   6. 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    Plan -Send palliative care referral  -I sent a message to SW for her f/u -Lab, flush, f/u and Irinotecan and Avastin on 12/7, her mother will come in with her for OV   No problem-specific Assessment & Plan notes found for this encounter.   Orders Placed This Encounter  Procedures  . Amb Referral to Palliative Care    Referral Priority:   Routine    Referral Type:   Consultation    Referral Reason:   Advance Care Planning    Number of Visits Requested:   1   I discussed the assessment and treatment plan with the patient. The patient was provided an opportunity to ask questions and all were answered. The patient agreed with the plan and demonstrated an understanding of the instructions.  The patient was advised to call back or seek an in-person evaluation if the symptoms worsen or if the condition fails to improve as anticipated.  I provided 15 minutes of face-to-face video visit time during this encounter, and > 50% was spent counseling as documented under my assessment & plan.    Truitt Merle, MD  02/24/2019   I, Joslyn Devon, am acting as scribe for Truitt Merle, MD.   I have reviewed the above documentation for accuracy and completeness, and I agree with the above.

## 2019-02-27 ENCOUNTER — Encounter: Payer: Self-pay | Admitting: Hematology

## 2019-02-27 ENCOUNTER — Other Ambulatory Visit: Payer: Self-pay | Admitting: Hematology

## 2019-02-27 DIAGNOSIS — F419 Anxiety disorder, unspecified: Secondary | ICD-10-CM

## 2019-02-28 ENCOUNTER — Telehealth: Payer: Self-pay | Admitting: Adult Health

## 2019-02-28 ENCOUNTER — Encounter: Payer: Self-pay | Admitting: Adult Health

## 2019-02-28 ENCOUNTER — Ambulatory Visit (INDEPENDENT_AMBULATORY_CARE_PROVIDER_SITE_OTHER): Payer: BC Managed Care – PPO | Admitting: Adult Health

## 2019-02-28 ENCOUNTER — Inpatient Hospital Stay: Payer: BC Managed Care – PPO

## 2019-02-28 ENCOUNTER — Other Ambulatory Visit: Payer: Self-pay | Admitting: Nurse Practitioner

## 2019-02-28 ENCOUNTER — Other Ambulatory Visit: Payer: Self-pay

## 2019-02-28 ENCOUNTER — Inpatient Hospital Stay: Payer: BC Managed Care – PPO | Admitting: Hematology

## 2019-02-28 DIAGNOSIS — R059 Cough, unspecified: Secondary | ICD-10-CM | POA: Insufficient documentation

## 2019-02-28 DIAGNOSIS — R05 Cough: Secondary | ICD-10-CM

## 2019-02-28 DIAGNOSIS — F419 Anxiety disorder, unspecified: Secondary | ICD-10-CM

## 2019-02-28 MED ORDER — ALPRAZOLAM 1 MG PO TABS: ORAL_TABLET | ORAL | 0 refills | Status: AC

## 2019-02-28 NOTE — Telephone Encounter (Signed)
Refill request. Last filled 11/11/18

## 2019-02-28 NOTE — Assessment & Plan Note (Addendum)
Assessment and Plan: Albuterol PRN Remain well hydrated, OTC Vicks Vapor rub. Continue all mx meds as directed. If sx's worsen/continue into next week- send MyChart message- will send course of Azithromycin.  Follow Up Instructions: PRN acute sx's A1c re-check 2 weeks TeleMedicine 2 weeks for chronic medical- HTN, HLD,T2D   I discussed the assessment and treatment plan with the patient. The patient was provided an opportunity to ask questions and all were answered. The patient agreed with the plan and demonstrated an understanding of the instructions.   The patient was advised to call back or seek an in-person evaluation if the symptoms worsen or if the condition fails to improve as anticipated.

## 2019-02-28 NOTE — Telephone Encounter (Signed)
Pt states that she just completed a round of doxycycline for an infection in her arm and wanted to know if Valetta Fuller thought she needed another course for "bronchitis".  Advised pt that she would need a telehealth visit to discuss treatment.  Pt expressed understanding, was agreeable, and transferred to front desk to schedule appt.  Charyl Bigger, CMA

## 2019-02-28 NOTE — Telephone Encounter (Signed)
Patient seeks advice on which Medication she can take for her bronchitis (per her she get a flare up this time every year.  --- she has some meds(antibiotics at home) but is unsure what to take  --Forwarding message to med asst to call pt @ [phone].  --glh

## 2019-02-28 NOTE — Progress Notes (Signed)
Virtual Visit via Telephone Note  I connected with Beverly Schwartz on 02/28/19 at  3:15 PM EST by telephone and verified that I am speaking with the correct person using two identifiers.  Location: Patient: Home Provider: In Clinic I discussed the limitations, risks, security and privacy concerns of performing an evaluation and management service by telephone and the availability of in person appointments. I also discussed with the patient that there may be a patient responsible charge related to this service. The patient expressed understanding and agreed to proceed.   History of Present Illness: Beverly Schwartz calls in with complaints of productive cough (she is unsure of color mucus). She reports clear- yellow nasal drainage. She reports frontal HA, intermittent dull ache 5/10. She denies loss of sense of smell or taste. Sx's present for 48 hrs Temp 97.20f oral  She denies known exposure to Corona Virus  Ambulatory BP SBP 130-140 DBP 80-100 HR 70-80 She is currently on Amlodipine 10mg  QD and Lisinopril 5mg  QD She denies acute cardiac sx's  L arm cellulitis treated with course of Doxycycline 100mg  BID x 10 days- completed 48 hrs ago. She was treated by [facility]   AM BG 110-130, denies hypoglycemia  Currently on Novolin 10 U BID Lab Results  Component Value Date   HGBA1C 7.0 (A) 12/13/2018   HGBA1C 8.0 (A) 08/18/2018   HGBA1C 9.0 (H) 05/31/2017   Patient Care Team    Relationship Specialty Notifications Start End  Mina Marble D, NP PCP - General Family Medicine  07/07/16   Delrae Rend, MD Consulting Physician Endocrinology  07/07/16   Truitt Merle, [place] Physician Hematology  06/05/17   Alla Feeling, NP Nurse Practitioner Nurse Practitioner  06/05/17   Clarene Essex, MD Consulting Physician Gastroenterology  06/05/17     Patient Active Problem List   Diagnosis Date Noted  . Skin tag 12/13/2018  . ADHD (attention deficit hyperactivity disorder) evaluation  12/13/2018  . Foot callus 12/13/2018  . Financial difficulties 12/13/2018  . Headache 07/23/2018  . Excessive cerumen in both ear canals 05/05/2018  . Acute respiratory infection 03/30/2018  . Genetic testing 08/10/2017  . Family history of breast cancer   . Family history of stomach cancer   . Goals of care, counseling/discussion 06/15/2017  . 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 SIRS (systemic inflammatory response syndrome) ([place]) 05/30/2017  . Healthcare maintenance 12/10/2016  . Elevated LFTs 12/10/2016  . Generalized abdominal pain 09/30/2016  . Anxiety 09/30/2016  . Sinusitis 09/03/2016  . Diabetes mellitus without complication ([place]) [id number]  . Hypothyroid 07/07/2016  . Hypertension 07/07/2016  . Hyperlipidemia 08/06/2010  . Tachycardia   . Chest pain   . Dizziness   . Obesity      Past Medical History:  Diagnosis Date  . Anxiety   . Cancer ([place])    colon, liver  . Chest pain 10/18/2018   Patient c/o chest pain right side yesterday intermitten lasting 3-4 hours.  [name], PA notified  . Colon cancer ([place])   . Complication of anesthesia   . Depression   . Diabetes mellitus   . Dizziness   . Family history of breast cancer   . Family history of stomach cancer   . Hyperlipidemia   . Hypertension   . Hypothyroidism   . Obesity   . PONV (postoperative nausea and vomiting)   . Tachycardia      Past Surgical History:  Procedure Laterality Date  . FLEXIBLE SIGMOIDOSCOPY  N/A 05/31/2017   Procedure: FLEXIBLE SIGMOIDOSCOPY;  Surgeon: Clarene Essex, MD;  Location: [facility];  Service: Endoscopy;  Laterality: N/A;  . IR IMAGING GUIDED PORT INSERTION  10/19/2018  . WISDOM TOOTH EXTRACTION     age 5's     Family History  Problem Relation Age of Onset  . Heart disease Father   . Healthy Mother   . Hyperlipidemia Sister   . Healthy Brother   . Heart disease Paternal Uncle   . Heart attack Paternal Uncle   . Healthy Son   .  Breast cancer Maternal Grandmother        d. in mid 79s  . Stomach cancer Paternal Grandfather        d. 8  . Colon cancer Neg Hx   . Esophageal cancer Neg Hx   . Rectal cancer Neg Hx   . Liver cancer Neg Hx      Social History   Substance and Sexual Activity  Drug Use No     Social History   Substance and Sexual Activity  Alcohol Use No     Social History   Tobacco Use  Smoking Status Never Smoker  Smokeless Tobacco Never Used     Outpatient Encounter Medications as of 02/28/2019  Medication Sig  . acetaminophen-codeine (TYLENOL #3) 300-30 MG tablet Take 1 tablet by mouth every 6 (six) hours as needed for moderate pain.  Marland Kitchen albuterol (PROVENTIL HFA;VENTOLIN HFA) 108 (90 Base) MCG/ACT inhaler Inhale 2 puffs into the lungs every 6 (six) hours as needed for wheezing or shortness of breath.  Marland Kitchen amLODipine (NORVASC) 10 MG tablet Take 1 tablet (10 mg total) by mouth daily.  Marland Kitchen CINNAMON PO Take 1 tablet by mouth daily.   . fluticasone (FLONASE) 50 MCG/ACT nasal spray Place 1 spray into both nostrils daily. (Patient taking differently: Place 1 spray into both nostrils daily as needed for allergies. )  . insulin NPH-regular Human (NOVOLIN 70/30) (70-30) 100 UNIT/ML injection INJECT 10 UNITS WITH BREAKFAST, 10 UNITS WITH DINNER  . Insulin Syringe-Needle U-100 (BD INSULIN SYRINGE U/F) 31G X 5/16" 1 ML MISC Inject twice a day as directed  . levothyroxine (SYNTHROID, LEVOTHROID) 112 MCG tablet Take 1 tablet (112 mcg total) by mouth daily before breakfast.  . lisinopril (ZESTRIL) 5 MG tablet Take 1 tablet (5 mg total) by mouth daily.  . Omega-3 Fatty Acids (FISH OIL) 1000 MG CAPS Take 1,000 mg by mouth daily.   . polyethylene glycol (MIRALAX / GLYCOLAX) packet Take 17 g by mouth daily.  . TURMERIC PO Take 1 capsule by mouth daily.   . [DISCONTINUED] ALPRAZolam (XANAX) 1 MG tablet TAKE 1 TABLET BY MOUTH AT BEDTIME AS NEEDED FOR ANXIETY  . [DISCONTINUED] benzonatate (TESSALON) 100 MG  capsule Take 1 capsule (100 mg total) by mouth 3 (three) times daily as needed for cough.  . [DISCONTINUED] cetirizine (ZYRTEC ALLERGY) 10 MG tablet Take 1 tablet (10 mg total) by mouth daily.  . [DISCONTINUED] clindamycin (CLINDAGEL) 1 % gel APPLY TO AFFECTED AREA TWICE A DAY  . [DISCONTINUED] diphenoxylate-atropine (LOMOTIL) 2.5-0.025 MG tablet Take 1-2 tablets by mouth 4 (four) times daily as needed for diarrhea or loose stools.  . [DISCONTINUED] gabapentin (NEURONTIN) 100 MG capsule Take 1 capsule (100 mg total) by mouth 3 (three) times daily. If tolerates well, OK to increase to 300mg  at night in 2 weeks  . [DISCONTINUED] lidocaine-prilocaine (EMLA) cream APPLY 1 APPLICATION TOPICALLY AS NEEDED.  . [DISCONTINUED] mirtazapine (REMERON) 7.5 MG tablet  TAKE 1 TABLET BY MOUTH AT BEDTIME.  . [DISCONTINUED] prochlorperazine (COMPAZINE) 10 MG tablet Take 1 tablet (10 mg total) by mouth every 6 (six) hours as needed (NAUSEA).   Facility-Administered Encounter Medications as of 02/28/2019  Medication  . 0.9 %  sodium chloride infusion    Allergies: Bupropion and Amoxicillin  There is no height or weight on file to calculate BMI.  Pulse 99, temperature (!) 97 F (36.1 C), temperature source Oral. Review of Systems: General:   Denies fever, chills, unexplained weight loss.  Optho/Auditory:   Denies visual changes, blurred vision/LOV Respiratory:   Denies SOB, DOE more than baseline levels. Cough + Wheezing - Nasal Drainage +  Cardiovascular:   Denies chest pain, palpitations, new onset peripheral edema  Gastrointestinal:   Denies nausea, vomiting, diarrhea.  Genitourinary: Denies dysuria, freq/ urgency, flank pain or discharge from genitals.  Endocrine:     Denies hot or cold intolerance, polyuria, polydipsia. Musculoskeletal:   Denies unexplained myalgias, joint swelling, unexplained arthralgias, gait problems.  Skin:  Denies rash, suspicious lesions Neurological:     Denies dizziness,  unexplained weakness, numbness  Psychiatric/Behavioral:   Denies mood changes, suicidal or homicidal ideations, hallucinations This patient does not have sx concerning for COVID-19 Infection (ie; fever, chills, cough, new or worsening shortness of breath).   Observations/Objective: No acute distress noted during the telephone conversation.    Assessment and Plan: Albuterol PRN Remain well hydrated, OTC Vicks Vapor rub. Continue all mx meds as directed. If sx's worsen/continue into next week- send MyChart message- will send course of Azithromycin.  Follow Up Instructions: PRN acute sx's A1c re-check 2 weeks TeleMedicine 2 weeks for chronic medical- HTN, HLD, T2D   I discussed the assessment and treatment plan with the patient. The patient was provided an opportunity to ask questions and all were answered. The patient agreed with the plan and demonstrated an understanding of the instructions.   The patient was advised to call back or seek an in-person evaluation if the symptoms worsen or if the condition fails to improve as anticipated.  I provided15 minutes of non-face-to-face time during this encounter.   Esaw Grandchild, NP

## 2019-03-02 ENCOUNTER — Encounter: Payer: Self-pay | Admitting: General Practice

## 2019-03-02 ENCOUNTER — Other Ambulatory Visit: Payer: Self-pay

## 2019-03-02 ENCOUNTER — Telehealth: Payer: Self-pay | Admitting: Hematology

## 2019-03-02 MED ORDER — BENZONATATE 100 MG PO CAPS: 100.0000 mg | ORAL_CAPSULE | Freq: Three times a day (TID) | ORAL | 0 refills | Status: AC | PRN

## 2019-03-02 NOTE — Progress Notes (Signed)
[place] Spiritual Care Note  Reached out to [employer] by phone for pastoral check-in. The near-completion of her tiny house is a source of joy and comfort, as she awaits independence that is close enough to her mom to receive support. Covid continues to be a stressor, which she is balancing with the knowledge that she needs to continue treatment. We plan to follow up in infusion at a future treatment (12/15 if possible).   [place], [place], [facility] Pager [phone] Voicemail [phone]

## 2019-03-02 NOTE — Telephone Encounter (Signed)
R/s appt per 12/7 shc message - pt aware of new appt date and time

## 2019-03-03 ENCOUNTER — Telehealth: Payer: Self-pay

## 2019-03-03 ENCOUNTER — Encounter: Payer: Self-pay | Admitting: Hematology

## 2019-03-03 ENCOUNTER — Other Ambulatory Visit: Payer: Self-pay | Admitting: Hematology

## 2019-03-03 DIAGNOSIS — C19 Malignant neoplasm of rectosigmoid junction: Secondary | ICD-10-CM

## 2019-03-03 MED ORDER — ACETAMINOPHEN-CODEINE #3 300-30 MG PO TABS: 1.0000 | ORAL_TABLET | Freq: Four times a day (QID) | ORAL | 0 refills | Status: DC | PRN

## 2019-03-03 NOTE — Telephone Encounter (Signed)
Spoke with patient regarding appointment for CT scan scheduled for Tuesday 12/15 to arrive at [facility] at 8:15, NPO for 4 hours prior, instructed to drink one bottle at 6:30 am and one bottle at 7:30 am.  The patient will pick up the oral prep tomorrow.  She verbalized an understanding.

## 2019-03-07 ENCOUNTER — Telehealth: Payer: Self-pay

## 2019-03-07 NOTE — Telephone Encounter (Signed)
Patient calls stating she will get her CT scan in the morning but would like to reschedule her other appointments to Thursday or Friday of this week.  Sent scheduling message to reflect her request.

## 2019-03-08 ENCOUNTER — Inpatient Hospital Stay: Payer: BC Managed Care – PPO | Admitting: Hematology

## 2019-03-08 ENCOUNTER — Inpatient Hospital Stay: Payer: BC Managed Care – PPO

## 2019-03-08 ENCOUNTER — Other Ambulatory Visit: Payer: Self-pay

## 2019-03-08 ENCOUNTER — Encounter (HOSPITAL_COMMUNITY): Payer: Self-pay

## 2019-03-08 ENCOUNTER — Ambulatory Visit (HOSPITAL_COMMUNITY)
Admission: RE | Admit: 2019-03-08 | Discharge: 2019-03-08 | Disposition: A | Discharge status: Home / Self Care | Payer: BC Managed Care – PPO | Source: Ambulatory Visit | Attending: Hematology | Admitting: Hematology

## 2019-03-08 DIAGNOSIS — C19 Malignant neoplasm of rectosigmoid junction: Secondary | ICD-10-CM | POA: Diagnosis not present

## 2019-03-08 MED ORDER — SODIUM CHLORIDE (PF) 0.9 % IJ SOLN
INTRAMUSCULAR | Status: AC
  Filled 2019-03-08: qty 50

## 2019-03-08 MED ORDER — IOHEXOL 300 MG/ML  SOLN
100.0000 mL | Freq: Once | INTRAMUSCULAR | Status: AC | PRN
  Administered 2019-03-08: 100 mL via INTRAVENOUS

## 2019-03-08 NOTE — Progress Notes (Signed)
[place]   [phone] [phone]   Clinic Follow up Note   Patient Care Team: Esaw Grandchild, NP as PCP - General (Family Medicine) Delrae Rend, MD as Consulting Physician (Endocrinology) Truitt Merle, MD as Consulting Physician (Hematology) Alla Feeling, NP as Nurse Practitioner (Nurse Practitioner) Clarene Essex, MD as Consulting Physician (Gastroenterology)  I connected with Cherly Beach on 03/10/2019 at  8:40 AM EST by video enabled telemedicine visit and verified that I am speaking with the correct person using two identifiers.  I discussed the limitations, risks, security and privacy concerns of performing an evaluation and management service by telephone and the availability of in person appointments. I also discussed with the patient that there may be a patient responsible charge related to this service. The patient expressed understanding and agreed to proceed.   Other persons participating in the visit and their role in the encounter:  Her mother  Patient's location:  Her home  Provider's location:  My Office   CHIEF COMPLAINT: Follow-up metastatic colon cancer  SUMMARY OF ONCOLOGIC HISTORY: Oncology History Overview Note  Cancer Staging Malignant neoplasm of rectosigmoid junction [facility]) Staging form: Colon and Rectum, AJCC 8th Edition - Clinical stage from 06/11/2017: Stage IVA (cTX, cN1b, pM1a) - Signed by Truitt Merle, MD on 06/15/2017     Malignant neoplasm of rectosigmoid junction ([place])  05/30/2017 Imaging   CT AP IMPRESSION: 1. 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 Abdominopelvic adenopathy. Rectosigmoid mass with suggestion of partial obstruction as evidenced by large colonic stool burden more proximally. 2.  Aortic Atherosclerosis (ICD10-I70.0).  This is age advanced.   05/31/2017 Tumor Marker   CEA 353.3 (elevated) AFP 4.5 (normal)   05/31/2017 Initial  Biopsy   Diagnosis Colon, biopsy, sigmoid - INVASIVE ADENOCARCINOMA - SEE COMMENT   05/31/2017 Imaging   CT CHEST IMPRESSION: 1. No evidence of metastatic disease in the chest. 2. No acute findings.  Aortic Atherosclerosis (ICD10-I70.0).    05/31/2017 Procedure   Colonoscopy per Dr. Watt Climes Findings: An infiltrative and ulcerated partially obstructing medium-sized mass was found in the recto-sigmoid colon. The mass was circumferential. The mass measured four cm in length. No bleeding was present.   Impression - One small polyp in the rectum. - The examination was otherwise normal. - Malignant partially obstructing tumor in the recto-sigmoid colon. Biopsied. - One medium polyp in the proximal sigmoid colon. - The examination was otherwise normal. - Internal hemorrhoids.   06/03/2017 Initial Diagnosis   Malignant neoplasm of transverse colon ([place])   06/11/2017 Cancer Staging   Staging form: Colon and Rectum, AJCC 8th Edition - Clinical stage from 06/11/2017: Stage IVA (cTX, cN1b, pM1a) - Signed by Truitt Merle, MD on 06/15/2017   06/11/2017 Pathology Results   Liver biopsy confirmed metastatic colon cancer   07/23/2017 Imaging   CT CAP IMPRESSION: 1. Mild progression of hepatic metastasis. 2. Similar rectosigmoid primary with abdominopelvic nodal metastasis. 3.  No acute process or evidence of metastatic disease in the chest. 4. Aortic Atherosclerosis (ICD10-I70.0). Coronary artery atherosclerosis. This is age advanced. 5. Proximal colonic constipation, again suggesting a component of partial obstruction at the primary site. 6. Mild ascending aortic dilatation at 4.1 cm.   08/25/2017 - 05/10/2018 Chemotherapy   -Xeloda '1500mg'$  BID 1 week on and 1 week off starting 08/25/17. Due to her worsening hand-foot syndrome her dose was reduced to '1500mg'$  in the AM and '1000mg'$  in the PM. Due to disease progression we  swithced her to CAPOX  -Vectibix every 2 weeks starting 08/25/17. Due to skin rash  will stop starting 05/10/18.     12/14/2017 Imaging   IMPRESSION: 1. Response to therapy. Marked decrease in hepatic metastatic burden. More mild decrease in abdominopelvic adenopathy and definition of rectosigmoid primary. 2.  No acute process or evidence of metastatic disease in the chest. 3.  Aortic Atherosclerosis (ICD10-I70.0).    04/28/2018 Imaging   CT CAP 04/28/18  IMPRESSION: 1. Index liver metastases measured on the previous study show no substantial interval change although new liver lesions on today's exam are concerning for progressive disease. 2. Mild lymphadenopathy in the upper abdomen is stable. 3. Persistent mild ill-defined wall thickening in the distal sigmoid colon near the rectosigmoid junction. 4.  Aortic Atherosclerois (ICD10-170.0)    05/17/2018 - 12/02/2018 Chemotherapy   CAPOX every 3 weeks with Xeloda '1500mg'$  BID 2 weeks on/1week off starting 05/17/18 -Add Avastin to first cycle.    09/09/2018 Imaging   CT CAP W Contrast IMPRESSION: 1. Slight interval decrease in size one of the right hepatic lobe lesions. Additional lesions within the liver are similar when compared to recent prior exam. 2. Mild adenopathy within the abdomen is stable. 3. Similar-appearing thick walled distal sigmoid colon.   12/20/2018 Imaging   CT CAP W Contrast  IMPRESSION: 1. Increase in size and number of hepatic metastatic lesions compared to the prior study. 2. Stable adenopathy in the abdomen. 3. Signs of sigmoid wall thickening as before.   01/04/2019 -  Chemotherapy   FOLFIRI and Avastin q2weeks starting 01/04/19. The start of 5FU is being held for now.    03/08/2019 Imaging   CT CAP W Contrast  IMPRESSION: 1. Interval progression of liver metastasis. 2. Stable borderline enlarged upper abdominal lymph node. 3. Aortic atherosclerosis.   Aortic Atherosclerosis (ICD10-I70.0).   03/22/2019 -  Chemotherapy   The patient had palonosetron (ALOXI) injection 0.25 mg, 0.25  mg, Intravenous,  Once, 0 of 4 cycles irinotecan (CAMPTOSAR) 340 mg in sodium chloride 0.9 % 500 mL chemo infusion, 180 mg/m2 = 340 mg, Intravenous,  Once, 0 of 4 cycles panitumumab (VECTIBIX) 440 mg in sodium chloride 0.9 % 100 mL chemo infusion, 6 mg/kg = 440 mg, Intravenous,  Once, 0 of 4 cycles fluorouracil (ADRUCIL) chemo injection 750 mg, 400 mg/m2 = 750 mg, Intravenous,  Once, 0 of 3 cycles fluorouracil (ADRUCIL) 4,450 mg in sodium chloride 0.9 % 61 mL chemo infusion, 2,400 mg/m2 = 4,450 mg, Intravenous, 1 Day/Dose, 0 of 3 cycles leucovorin 740 mg in sodium chloride 0.9 % 250 mL infusion, 400 mg/m2 = 740 mg, Intravenous,  Once, 0 of 3 cycles  for chemotherapy treatment.       CURRENT THERAPY:  FOLFIRI and Avastin q2weeks starting 01/04/19. The start of 5FU is being held for now.    INTERVAL HISTORY:  IRAIS MOTTRAM is here for a follow up. She notes she has been coughing with congestion in the chest for 1-2 weeks now. She denies fever or SOB. She notes she did call with PCP over the phone. She did not prescribe antibiotics because she thought cough was viral related. She notes recent upper abdominal pain and mid chest pain which is resolving since her latest scan. Her mother feels as long as patient can tolerate she should go for more intensive treatment.   She notes she hopes to have her house ready for her to move in last week. She also notes she had recent skin  infection of arm. She feels her skin is fine now and no pina.    REVIEW OF SYSTEMS:   Constitutional: Denies fevers, chills or abnormal weight loss Eyes: Denies blurriness of vision Ears, nose, mouth, throat, and face: Denies mucositis or sore throat Respiratory: Denies dyspnea or wheezes (+) cough and chest congestion.  Cardiovascular: Denies palpitation, chest discomfort or lower extremity swelling Gastrointestinal:  Denies nausea, heartburn or change in bowel habits Skin: Denies abnormal skin rashes Lymphatics:  Denies new lymphadenopathy or easy bruising Neurological:Denies numbness, tingling or new weaknesses Behavioral/Psych: Mood is stable, no new changes  All other systems were reviewed with the patient and are negative.  MEDICAL HISTORY:  Past Medical History:  Diagnosis Date  . Anxiety   . Cancer ([place])    colon, liver  . Chest pain 10/18/2018   Patient c/o chest pain right side yesterday intermitten lasting 3-4 hours.  [name], PA notified  . Colon cancer ([place])   . Complication of anesthesia   . Depression   . Diabetes mellitus   . Dizziness   . Family history of breast cancer   . Family history of stomach cancer   . Hyperlipidemia   . Hypertension   . Hypothyroidism   . Obesity   . PONV (postoperative nausea and vomiting)   . Tachycardia     SURGICAL HISTORY: Past Surgical History:  Procedure Laterality Date  . FLEXIBLE SIGMOIDOSCOPY N/A 05/31/2017   Procedure: FLEXIBLE SIGMOIDOSCOPY;  Surgeon: Clarene Essex, MD;  Location: [facility];  Service: Endoscopy;  Laterality: N/A;  . IR IMAGING GUIDED PORT INSERTION  10/19/2018  . WISDOM TOOTH EXTRACTION     age 56's    I have reviewed the social history and family history with the patient and they are unchanged from previous note.  ALLERGIES:  is allergic to bupropion and amoxicillin.  MEDICATIONS:  Current Outpatient Medications  Medication Sig Dispense Refill  . acetaminophen-codeine (TYLENOL #3) 300-30 MG tablet Take 1 tablet by mouth every 6 (six) hours as needed for moderate pain. 30 tablet 0  . albuterol (PROVENTIL HFA;VENTOLIN HFA) 108 (90 Base) MCG/ACT inhaler Inhale 2 puffs into the lungs every 6 (six) hours as needed for wheezing or shortness of breath. 1 Inhaler 0  . ALPRAZolam (XANAX) 1 MG tablet TAKE 1 TABLET BY MOUTH AT BEDTIME AS NEEDED FOR ANXIETY 30 tablet 0  . amLODipine (NORVASC) 10 MG tablet Take 1 tablet (10 mg total) by mouth daily. 90 tablet 0  . benzonatate (TESSALON) 100 MG capsule Take 1 capsule  (100 mg total) by mouth 3 (three) times daily as needed for cough. 30 capsule 0  . CINNAMON PO Take 1 tablet by mouth daily.     . fluticasone (FLONASE) 50 MCG/ACT nasal spray Place 1 spray into both nostrils daily. (Patient taking differently: Place 1 spray into both nostrils daily as needed for allergies. ) 16 g 2  . insulin NPH-regular Human (NOVOLIN 70/30) (70-30) 100 UNIT/ML injection INJECT 10 UNITS WITH BREAKFAST, 10 UNITS WITH DINNER 10 mL 2  . Insulin Syringe-Needle U-100 (BD INSULIN SYRINGE U/F) 31G X 5/16" 1 ML MISC Inject twice a day as directed 200 each 3  . levothyroxine (SYNTHROID, LEVOTHROID) 112 MCG tablet Take 1 tablet (112 mcg total) by mouth daily before breakfast. 90 tablet 3  . lisinopril (ZESTRIL) 5 MG tablet Take 1 tablet (5 mg total) by mouth daily. 90 tablet 0  . Omega-3 Fatty Acids (FISH OIL) 1000 MG CAPS Take 1,000 mg by  mouth daily.     . polyethylene glycol (MIRALAX / GLYCOLAX) packet Take 17 g by mouth daily. 14 each 0  . TURMERIC PO Take 1 capsule by mouth daily.      No current facility-administered medications for this visit.   Facility-Administered Medications Ordered in Other Visits  Medication Dose Route Frequency Provider Last Rate Last Admin  . 0.9 %  sodium chloride infusion   Intravenous Continuous Truitt Merle, MD   Stopped at 11/11/18 1330    PHYSICAL EXAMINATION: ECOG PERFORMANCE STATUS: 1 - Symptomatic but completely ambulatory  No vitals taken today, Exam not performed today  LABORATORY DATA:  I have reviewed the data as listed CBC Latest Ref Rng & Units 01/28/2019 01/04/2019 12/20/2018  WBC 4.0 - 10.5 K/uL 8.6 7.6 7.1  Hemoglobin 12.0 - 15.0 g/dL 12.5 12.3 14.1  Hematocrit 36.0 - 46.0 % 38.7 36.7 42.5  Platelets 150 - 400 K/uL 233 155 166     CMP Latest Ref Rng & Units 01/28/2019 01/04/2019 12/20/2018  Glucose 70 - 99 mg/dL 228(H) 152(H) 184(H)  BUN 6 - 20 mg/dL '10 7 11  '$ Creatinine 0.44 - 1.00 mg/dL 0.76 0.76 0.79  Sodium 135 - 145 mmol/L  140 144 139  Potassium 3.5 - 5.1 mmol/L 4.5 3.8 4.2  Chloride 98 - 111 mmol/L 106 106 107  CO2 22 - 32 mmol/L '23 25 24  '$ Calcium 8.9 - 10.3 mg/dL 7.6(L) 8.3(L) 8.4(L)  Total Protein 6.5 - 8.1 g/dL 7.2 6.8 7.4  Total Bilirubin 0.3 - 1.2 mg/dL 0.3 0.4 0.4  Alkaline Phos 38 - 126 U/L 191(H) 186(H) 185(H)  AST 15 - 41 U/L '24 25 23  '$ ALT 0 - 44 U/L '14 14 17      '$ RADIOGRAPHIC STUDIES: I have personally reviewed the radiological images as listed and agreed with the findings in the report. No results found.   ASSESSMENT & PLAN:  Beverly Schwartz is a 48 y.o. female with   1. Sigmoid adenocarcinoma, with abdominopelvic adenopathy andhepatic metastasis, stage IV, MSI-stable , KRAS/NRAS/BRAF wild type -Diagnosed in 05/2017. Patient declined intensive chemo regiments previously due to fear of side effects.She progressed on first lineXelodaandVectibixafter 8 months of therapy. -She had PAC placed on 10/19/18. -She recently progressed onsecond lineCAPOX and Avastin. She tolerated poorly due to hand foot syndrome and Neuropathy.  -I started her on third-line Irinotecan and Avastin 01/04/19. Dose was reduced due toneuropathy.I recommended FOLFIRI but pt wanted to hold start of 5FU until she moves into her new home in mid Dec -I personally reviewed her CT CAP images from 03/08/19 which showed interval progression of liver mets. She does have stable borderline enlarged upper abdominal LN. No other new lesions  -I discussed her disease progression is likely from inconsistent inadequate treatments, rather than true resistance to her current chemo. To better control her disease, I recommend she proceed with FOLFIRI and vectibix every 2 weeks. Since she has been off Vectibix for a year, I will rechallenge. I reviewed sde effects with her. She plans to move into her home in the next 1-2 weeks and will start 5FU afterward. She is agreeable to have a cycle of irinotecan and Vectibix after Christmas.     2. DM, HTN, HL -Continue medications (Amlodipine, lisinopril) and f/u with PCP. Will closely monitor BP on Avastin.  3. Financial and Social issues, Anxiety, depression -Shefollows up as needed withSocial Worker Hollice Espy -She is currently on Xanax  -Shehas regained insurancewithBlue cross insurancein 2020 -she  is more anxious lately due to the [place] pandemic. I will asked SW to f/u with her   4.Goal of care discussion  -She understands the goal of care is palliative, her cancer is incurable. The goal of therapy is to prolong her life and improve her quality of life. -She is full code for now   5.Insomnia, anxiety  -She has had trouble sleeping from anxiety and allergies  -She has Xanax which she uses as needed. -She notes being more anxious lately from [place] and concerns of contracting this.  -I offered her the opportunity to speak with out SW, she is agreeable.   6. 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  7. Cough with congestion  -for the past 1-2 weeks she has had cough with chest congestion -Her PCP feels this may be viral related.  -I recommend she proceed with COVID test, she is agreeable.    Plan -Lab, f/u and Irinotecan and vectibix on 12/28, unfortunately our infusion schedule is full the week of 12/28, and she is not willing to come in before or after Christmas, it will be moved to 1/6 then   -f/u on 1/6  -plan to start FOLFIRI and Vectibix on 1/20   No problem-specific Assessment & Plan notes found for this encounter.   No orders of the defined types were placed in this encounter.  I discussed the assessment and treatment plan with the patient. The patient was provided an opportunity to ask questions and all were answered. The patient agreed with the plan and demonstrated an understanding  of the instructions.  The patient was advised to call back or seek an in-person evaluation if the symptoms worsen or if the condition fails to improve as anticipated.  I provided 30 minutes of face-to-face video visit time during this encounter, and > 50% was spent counseling as documented under my assessment & plan.     Truitt Merle, MD 03/10/2019   I, Joslyn Devon, am acting as scribe for Truitt Merle, MD.   I have reviewed the above documentation for accuracy and completeness, and I agree with the above.

## 2019-03-09 ENCOUNTER — Encounter: Payer: Self-pay | Admitting: Hematology

## 2019-03-10 ENCOUNTER — Telehealth: Payer: Self-pay | Admitting: Hematology

## 2019-03-10 ENCOUNTER — Other Ambulatory Visit: Payer: Self-pay | Admitting: Hematology

## 2019-03-10 ENCOUNTER — Other Ambulatory Visit: Payer: BC Managed Care – PPO

## 2019-03-10 ENCOUNTER — Inpatient Hospital Stay (HOSPITAL_BASED_OUTPATIENT_CLINIC_OR_DEPARTMENT_OTHER): Payer: BC Managed Care – PPO | Admitting: Hematology

## 2019-03-10 ENCOUNTER — Encounter: Payer: Self-pay | Admitting: Hematology

## 2019-03-10 ENCOUNTER — Telehealth: Payer: Self-pay

## 2019-03-10 ENCOUNTER — Ambulatory Visit: Payer: BC Managed Care – PPO

## 2019-03-10 DIAGNOSIS — I10 Essential (primary) hypertension: Secondary | ICD-10-CM

## 2019-03-10 DIAGNOSIS — C19 Malignant neoplasm of rectosigmoid junction: Secondary | ICD-10-CM

## 2019-03-10 NOTE — Telephone Encounter (Signed)
Spoke with patient regarding her next treatment.  She is unwilling to come for any of the available time slots prior to Christmas and/or weekend time slots.  I stressed the importance of her receiving treatment and she stated she understood the implications of not receiving treatment.  I spoke with Ebony Hail in scheduling and told her to offer her next available after the holidays.

## 2019-03-10 NOTE — Telephone Encounter (Signed)
Scheduled appt per 12/17 los.  Called patient and offered her to come in on 01/02 to get treatment, and patient did not want to decouple appts.  I informed the asst director and she informed the MD nurse. The nurse called and spoke with the patient.  We pushed the appts out another week.  Spoke with pt and she is aware of her appt dates and time.

## 2019-03-10 NOTE — Progress Notes (Signed)
DISCONTINUE ON PATHWAY REGIMEN - Colorectal     A cycle is every 14 days:     Bevacizumab-xxxx      Irinotecan      Leucovorin      Fluorouracil      Fluorouracil   **Always confirm dose/schedule in your pharmacy ordering system**  REASON: Disease Progression PRIOR TREATMENT: [id number]: FOLFIRI + Bevacizumab q14 Days TREATMENT RESPONSE: Unable to Evaluate  START ON PATHWAY REGIMEN - Colorectal     A cycle is every 14 days:     Panitumumab      Irinotecan      Leucovorin      Fluorouracil      Fluorouracil   **Always confirm dose/schedule in your pharmacy ordering system**  Patient Characteristics: Distant Metastases, Nonsurgical Candidate, KRAS/NRAS Wild-Type (BRAF V600 Wild-Type/Unknown), Standard Cytotoxic Therapy, First Line Standard Cytotoxic Therapy, Left-sided Tumor, PS = 0,1 Tumor Location: Colon Therapeutic Status: Distant Metastases Microsatellite/Mismatch Repair Status: MSS/pMMR BRAF Mutation Status: Wild-Type (no mutation) KRAS/NRAS Mutation Status: Wild-Type (no mutation) Preferred Therapy Approach: Standard Cytotoxic Therapy Standard Cytotoxic Line of Therapy: First Line Standard Cytotoxic Therapy ECOG Performance Status: 0 Primary Tumor Location: Left-sided Tumor Intent of Therapy: Non-Curative / Palliative Intent, Discussed with Patient

## 2019-03-11 ENCOUNTER — Encounter: Payer: Self-pay | Admitting: Hematology

## 2019-03-11 ENCOUNTER — Other Ambulatory Visit: Payer: Self-pay | Admitting: Nurse Practitioner

## 2019-03-21 ENCOUNTER — Other Ambulatory Visit: Payer: Self-pay

## 2019-03-21 ENCOUNTER — Ambulatory Visit (INDEPENDENT_AMBULATORY_CARE_PROVIDER_SITE_OTHER): Payer: BC Managed Care – PPO | Admitting: Adult Health

## 2019-03-21 ENCOUNTER — Encounter: Payer: Self-pay | Admitting: Adult Health

## 2019-03-21 VITALS — BP 110/71 | HR 93 | Temp 98.6°F | Ht 65.0 in | Wt 155.5 lb

## 2019-03-21 DIAGNOSIS — M79675 Pain in left toe(s): Secondary | ICD-10-CM

## 2019-03-21 DIAGNOSIS — Z0000 Encounter for general adult medical examination without abnormal findings: Secondary | ICD-10-CM | POA: Diagnosis not present

## 2019-03-21 DIAGNOSIS — E119 Type 2 diabetes mellitus without complications: Secondary | ICD-10-CM | POA: Diagnosis not present

## 2019-03-21 DIAGNOSIS — E785 Hyperlipidemia, unspecified: Secondary | ICD-10-CM | POA: Diagnosis not present

## 2019-03-21 DIAGNOSIS — F419 Anxiety disorder, unspecified: Secondary | ICD-10-CM

## 2019-03-21 LAB — POCT GLYCOSYLATED HEMOGLOBIN (HGB A1C): Hemoglobin A1C: 7.4 % — AB (ref 4.0–5.6)

## 2019-03-21 NOTE — Assessment & Plan Note (Addendum)
Alprazolam 1mg  PRN QHS PRN PDMP reviewed, last refilled 02/28/2019- 30 count

## 2019-03-21 NOTE — Assessment & Plan Note (Signed)
Referral to Podiatry placed Do not attempt to remove the toenail yourself Keep feet clean/dry

## 2019-03-21 NOTE — Assessment & Plan Note (Signed)
Refused statin therapy

## 2019-03-21 NOTE — Progress Notes (Signed)
Subjective:    Patient ID: Beverly Schwartz, female    DOB: [birth year], 48 y.o.   MRN: [id number]  HPI:  Ms. Nudd is here for regular f/u: HTN, T2D, GAD She reports AM BG 130s She denies episodes of hypoglycemia She is currently on Novolin 70/30 10 U with breakfast and dinner She continues to experience occasional non-productive cough, however feels that it is slowly improving. She has hx of re-current bronchitis each winter. She has never used tobacco/vape products.  SARS-CoV-2 - negative 12/29/2018, also had another PCR test performed at [facility] -03/12/2019  BP is much improved today- 110/7 She has not been checking BP/HR at home. She denies acute cardiac sx's  She reports L foot, 4th toenail pain, believes that one of the dogs stepped on her foot. She denies drainage from toenail  Lab Results  Component Value Date   HGBA1C 7.4 (A) 03/21/2019   HGBA1C 7.0 (A) 12/13/2018   HGBA1C 8.0 (A) 08/18/2018   Patient Care Team    Relationship Specialty Notifications Start End  Mina Marble D, NP PCP - General Family Medicine  07/07/16   Delrae Rend, MD Consulting Physician Endocrinology  07/07/16   Truitt Merle, [place] Physician Hematology  06/05/17   Alla Feeling, NP Nurse Practitioner Nurse Practitioner  06/05/17   Clarene Essex, MD Consulting Physician Gastroenterology  06/05/17     Patient Active Problem List   Diagnosis Date Noted  . Pain around toenail, left foot 03/21/2019  . Cough in adult 02/28/2019  . Skin tag 12/13/2018  . ADHD (attention deficit hyperactivity disorder) evaluation 12/13/2018  . Foot callus 12/13/2018  . Financial difficulties 12/13/2018  . Headache 07/23/2018  . Excessive cerumen in both ear canals 05/05/2018  . Acute respiratory infection 03/30/2018  . Genetic testing 08/10/2017  . Family history of breast cancer   . Family history of stomach cancer   . Goals of care, counseling/discussion 06/15/2017  . 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 SIRS (systemic inflammatory response syndrome) ([place]) 05/30/2017  . Healthcare maintenance 12/10/2016  . Elevated LFTs 12/10/2016  . Generalized abdominal pain 09/30/2016  . Anxiety 09/30/2016  . Sinusitis 09/03/2016  . Diabetes mellitus without complication ([place]) [id number]  . Hypothyroid 07/07/2016  . Hypertension 07/07/2016  . Hyperlipidemia 08/06/2010  . Tachycardia   . Chest pain   . Dizziness   . Obesity      Past Medical History:  Diagnosis Date  . Anxiety   . Cancer ([place])    colon, liver  . Chest pain 10/18/2018   Patient c/o chest pain right side yesterday intermitten lasting 3-4 hours.  [name], PA notified  . Colon cancer ([place])   . Complication of anesthesia   . Depression   . Diabetes mellitus   . Dizziness   . Family history of breast cancer   . Family history of stomach cancer   . Hyperlipidemia   . Hypertension   . Hypothyroidism   . Obesity   . PONV (postoperative nausea and vomiting)   . Tachycardia      Past Surgical History:  Procedure Laterality Date  . FLEXIBLE SIGMOIDOSCOPY N/A 05/31/2017   Procedure: FLEXIBLE SIGMOIDOSCOPY;  Surgeon: Clarene Essex, MD;  Location: [facility];  Service: Endoscopy;  Laterality: N/A;  . IR IMAGING GUIDED PORT INSERTION  10/19/2018  . WISDOM TOOTH EXTRACTION     age 27's     Family History  Problem Relation Age of Onset  . Heart disease  Father   . Healthy Mother   . Hyperlipidemia Sister   . Healthy Brother   . Heart disease Paternal Uncle   . Heart attack Paternal Uncle   . Healthy Son   . Breast cancer Maternal Grandmother        d. in mid 21s  . Stomach cancer Paternal Grandfather        d. 4  . Colon cancer Neg Hx   . Esophageal cancer Neg Hx   . Rectal cancer Neg Hx   . Liver cancer Neg Hx      Social History   Substance and Sexual Activity  Drug Use No     Social History   Substance and Sexual Activity  Alcohol Use No     Social History    Tobacco Use  Smoking Status Never Smoker  Smokeless Tobacco Never Used     Outpatient Encounter Medications as of 03/21/2019  Medication Sig  . acetaminophen-codeine (TYLENOL #3) 300-30 MG tablet Take 1 tablet by mouth every 6 (six) hours as needed for moderate pain.  Marland Kitchen albuterol (PROVENTIL HFA;VENTOLIN HFA) 108 (90 Base) MCG/ACT inhaler Inhale 2 puffs into the lungs every 6 (six) hours as needed for wheezing or shortness of breath.  . ALPRAZolam (XANAX) 1 MG tablet TAKE 1 TABLET BY MOUTH AT BEDTIME AS NEEDED FOR ANXIETY  . amLODipine (NORVASC) 10 MG tablet TAKE 1 TABLET BY MOUTH EVERY DAY  . benzonatate (TESSALON) 100 MG capsule Take 1 capsule (100 mg total) by mouth 3 (three) times daily as needed for cough.  Marland Kitchen CINNAMON PO Take 1 tablet by mouth daily.   . fluticasone (FLONASE) 50 MCG/ACT nasal spray Place 1 spray into both nostrils daily. (Patient taking differently: Place 1 spray into both nostrils daily as needed for allergies. )  . insulin NPH-regular Human (NOVOLIN 70/30) (70-30) 100 UNIT/ML injection INJECT 10 UNITS WITH BREAKFAST, 10 UNITS WITH DINNER  . Insulin Syringe-Needle U-100 (BD INSULIN SYRINGE U/F) 31G X 5/16" 1 ML MISC Inject twice a day as directed  . levothyroxine (SYNTHROID, LEVOTHROID) 112 MCG tablet Take 1 tablet (112 mcg total) by mouth daily before breakfast.  . lisinopril (ZESTRIL) 5 MG tablet Take 1 tablet (5 mg total) by mouth daily.  . Omega-3 Fatty Acids (FISH OIL) 1000 MG CAPS Take 1,000 mg by mouth daily.   . polyethylene glycol (MIRALAX / GLYCOLAX) packet Take 17 g by mouth daily.  . TURMERIC PO Take 1 capsule by mouth daily.    Facility-Administered Encounter Medications as of 03/21/2019  Medication  . 0.9 %  sodium chloride infusion    Allergies: Bupropion and Amoxicillin  Body mass index is 25.88 kg/m.  Blood pressure 110/71, pulse 93, temperature 98.6 F (37 C), temperature source Oral, height 5\' 5"  (1.651 m), weight 155 lb 8 oz (70.5 kg),  last menstrual period 03/01/2019.   Review of Systems  Constitutional: Positive for fatigue. Negative for activity change, appetite change, chills, diaphoresis, fever and unexpected weight change.  Respiratory: Negative for cough, chest tightness, shortness of breath, wheezing and stridor.   Cardiovascular: Negative for chest pain, palpitations and leg swelling.  Gastrointestinal: Positive for abdominal pain. Negative for abdominal distention, anal bleeding, blood in stool, constipation, diarrhea, nausea and vomiting.  Endocrine: Negative for polydipsia, polyphagia and polyuria.  Genitourinary: Negative for difficulty urinating and flank pain.  Neurological: Negative for dizziness and headaches.  Hematological: Negative for adenopathy. Does not bruise/bleed easily.  Psychiatric/Behavioral: Negative for agitation, behavioral problems, confusion, decreased concentration,  dysphoric mood, hallucinations, self-injury, sleep disturbance and suicidal ideas. The patient is nervous/anxious. The patient is not hyperactive.        Objective:   Physical Exam Vitals and nursing note reviewed.  Constitutional:      Appearance: Normal appearance. She is normal weight.  HENT:     Head: Normocephalic and atraumatic.  Eyes:     Extraocular Movements: Extraocular movements intact.     Conjunctiva/sclera: Conjunctivae normal.     Pupils: Pupils are equal, round, and reactive to light.  Cardiovascular:     Rate and Rhythm: Normal rate and regular rhythm.     Pulses: Normal pulses.     Heart sounds: Normal heart sounds.  Pulmonary:     Effort: Pulmonary effort is normal.     Breath sounds: Normal breath sounds.  Skin:    General: Skin is warm and dry.     Comments: L foot 4th toe- Toenail lighting up slightly from tip, well anchored at bass.  No erythema/excessive warmth/streaking/drainage noted.  Neurological:     Mental Status: She is alert and oriented to person, place, and time.  Psychiatric:         Attention and Perception: Attention and perception normal.        Mood and Affect: Mood is anxious.        Speech: Speech is rapid and pressured.        Behavior: Behavior normal.        Thought Content: Thought content normal.        Cognition and Memory: Cognition and memory normal.        Judgment: Judgment normal.       Assessment & Plan:   1. Diabetes mellitus without complication ([place])   2. Pain around toenail, left foot   3. Hyperlipidemia, unspecified hyperlipidemia type   4. Healthcare maintenance   5. Anxiety     Hyperlipidemia Refused statin therapy  Diabetes mellitus without complication ([facility]) Lab Results  Component Value Date   HGBA1C 7.4 (A) 03/21/2019   HGBA1C 7.0 (A) 12/13/2018   HGBA1C 8.0 (A) 08/18/2018   AM BG 130s, denies episodes of hypoglycemia Continue Novolin 70/30 10 U BID with meals   Healthcare maintenance Blood pressure and blood sugar- both very good today! A1c- 7.4 Continue all medications as directed. Referral to Podiatry placed to address toenail pain-do not not try to remove toenail. Continue close follow-up with Oncology team. Continue social distance and wear a mask when in public. Follow-up in 3 months.  Anxiety Alprazolam 1mg  PRN QHS PRN PDMP reviewed, last refilled 02/28/2019- 30 count   Pain around toenail, left foot Referral to Podiatry placed Do not attempt to remove the toenail yourself Keep feet clean/dry    FOLLOW-UP:  Return in about 3 months (around 06/19/2019) for Regular Follow Up, HTN, Diabetes.

## 2019-03-21 NOTE — Patient Instructions (Addendum)
Diabetes Mellitus and Nutrition, Adult When you have diabetes (diabetes mellitus), it is very important to have healthy eating habits because your blood sugar (glucose) levels are greatly affected by what you eat and drink. Eating healthy foods in the appropriate amounts, at about the same times every day, can help you:  Control your blood glucose.  Lower your risk of heart disease.  Improve your blood pressure.  Reach or maintain a healthy weight. Every person with diabetes is different, and each person has different needs for a meal plan. Your health care provider may recommend that you work with a diet and nutrition specialist (dietitian) to make a meal plan that is best for you. Your meal plan may vary depending on factors such as:  The calories you need.  The medicines you take.  Your weight.  Your blood glucose, blood pressure, and cholesterol levels.  Your activity level.  Other health conditions you have, such as heart or kidney disease. How do carbohydrates affect me? Carbohydrates, also called carbs, affect your blood glucose level more than any other type of food. Eating carbs naturally raises the amount of glucose in your blood. Carb counting is a method for keeping track of how many carbs you eat. Counting carbs is important to keep your blood glucose at a healthy level, especially if you use insulin or take certain oral diabetes medicines. It is important to know how many carbs you can safely have in each meal. This is different for every person. Your dietitian can help you calculate how many carbs you should have at each meal and for each snack. Foods that contain carbs include:  Bread, cereal, rice, pasta, and crackers.  Potatoes and corn.  Peas, beans, and lentils.  Milk and yogurt.  Fruit and juice.  Desserts, such as cakes, cookies, ice cream, and candy. How does alcohol affect me? Alcohol can cause a sudden decrease in blood glucose (hypoglycemia),  especially if you use insulin or take certain oral diabetes medicines. Hypoglycemia can be a life-threatening condition. Symptoms of hypoglycemia (sleepiness, dizziness, and confusion) are similar to symptoms of having too much alcohol. If your health care provider says that alcohol is safe for you, follow these guidelines:  Limit alcohol intake to no more than 1 drink per day for nonpregnant women and 2 drinks per day for men. One drink equals 12 oz of beer, 5 oz of wine, or 1 oz of hard liquor.  Do not drink on an empty stomach.  Keep yourself hydrated with water, diet soda, or unsweetened iced tea.  Keep in mind that regular soda, juice, and other mixers may contain a lot of sugar and must be counted as carbs. What are tips for following this plan?  Reading food labels  Start by checking the serving size on the "Nutrition Facts" label of packaged foods and drinks. The amount of calories, carbs, fats, and other nutrients listed on the label is based on one serving of the item. Many items contain more than one serving per package.  Check the total grams (g) of carbs in one serving. You can calculate the number of servings of carbs in one serving by dividing the total carbs by 15. For example, if a food has 30 g of total carbs, it would be equal to 2 servings of carbs.  Check the number of grams (g) of saturated and trans fats in one serving. Choose foods that have low or no amount of these fats.  Check the number of   milligrams (mg) of salt (sodium) in one serving. Most people should limit total sodium intake to less than 2,300 mg per day.  Always check the nutrition information of foods labeled as "low-fat" or "nonfat". These foods may be higher in added sugar or refined carbs and should be avoided.  Talk to your dietitian to identify your daily goals for nutrients listed on the label. Shopping  Avoid buying canned, premade, or processed foods. These foods tend to be high in fat, sodium,  and added sugar.  Shop around the outside edge of the grocery store. This includes fresh fruits and vegetables, bulk grains, fresh meats, and fresh dairy. Cooking  Use low-heat cooking methods, such as baking, instead of high-heat cooking methods like deep frying.  Cook using healthy oils, such as olive, canola, or sunflower oil.  Avoid cooking with butter, cream, or high-fat meats. Meal planning  Eat meals and snacks regularly, preferably at the same times every day. Avoid going long periods of time without eating.  Eat foods high in fiber, such as fresh fruits, vegetables, beans, and whole grains. Talk to your dietitian about how many servings of carbs you can eat at each meal.  Eat 4-6 ounces (oz) of lean protein each day, such as lean meat, chicken, fish, eggs, or tofu. One oz of lean protein is equal to: ? 1 oz of meat, chicken, or fish. ? 1 egg. ?  cup of tofu.  Eat some foods each day that contain healthy fats, such as avocado, nuts, seeds, and fish. Lifestyle  Check your blood glucose regularly.  Exercise regularly as told by your health care provider. This may include: ? 150 minutes of moderate-intensity or vigorous-intensity exercise each week. This could be brisk walking, biking, or water aerobics. ? Stretching and doing strength exercises, such as yoga or weightlifting, at least 2 times a week.  Take medicines as told by your health care provider.  Do not use any products that contain nicotine or tobacco, such as cigarettes and e-cigarettes. If you need help quitting, ask your health care provider.  Work with a Social worker or diabetes educator to identify strategies to manage stress and any emotional and social challenges. Questions to ask a health care provider  Do I need to meet with a diabetes educator?  Do I need to meet with a dietitian?  What number can I call if I have questions?  When are the best times to check my blood glucose? Where to find more  information:  American Diabetes Association: diabetes.org  Academy of Nutrition and Dietetics: www.eatright.[employer] of Diabetes and Digestive and Kidney Diseases (NIH): [employer] Summary  A healthy meal plan will help you control your blood glucose and maintain a healthy lifestyle.  Working with a diet and nutrition specialist (dietitian) can help you make a meal plan that is best for you.  Keep in mind that carbohydrates (carbs) and alcohol have immediate effects on your blood glucose levels. It is important to count carbs and to use alcohol carefully. This information is not intended to replace advice given to you by your health care provider. Make sure you discuss any questions you have with your health care provider. Document Released: 12/05/2004 Document Revised: 02/20/2017 Document Reviewed: 04/14/2016 Elsevier Patient Education  [place].   Type 2 Diabetes Mellitus, Self Care, Adult When you have type 2 diabetes (type 2 diabetes mellitus), you must make sure your blood sugar (glucose) stays in a healthy range. You can do this  with:  Nutrition.  Exercise.  Lifestyle changes.  Medicines or insulin, if needed.  Support from your doctors and others. How to stay aware of blood sugar   Check your blood sugar level every day, as often as told.  Have your A1c (hemoglobin A1c) level checked two or more times a year. Have it checked more often if your doctor tells you to. Your doctor will set personal treatment goals for you. Generally, you should have these blood sugar levels:  Before meals (preprandial): 80-130 mg/dL (4.4-7.2 mmol/L).  After meals (postprandial): below 180 mg/dL (10 mmol/L).  A1c level: less than 7%. How to manage high and low blood sugar Signs of high blood sugar High blood sugar is called hyperglycemia. Know the signs of high blood sugar. Signs may include:  Feeling: ? Thirsty. ? Hungry. ? Very tired.  Needing to  pee (urinate) more than usual.  Blurry vision. Signs of low blood sugar Low blood sugar is called hypoglycemia. This is when blood sugar is at or below 70 mg/dL (3.9 mmol/L). Signs may include:  Feeling: ? Hungry. ? Worried or nervous (anxious). ? Sweaty and clammy. ? Confused. ? Dizzy. ? Sleepy. ? Sick to your stomach (nauseous).  Having: ? A fast heartbeat. ? A headache. ? A change in your vision. ? Jerky movements that you cannot control (seizure). ? Tingling or no feeling (numbness) around your mouth, lips, or tongue.  Having trouble with: ? Moving (coordination). ? Sleeping. ? Passing out (fainting). ? Getting upset easily (irritability). Treating low blood sugar To treat low blood sugar, eat or drink something sugary right away. If you can think clearly and swallow safely, follow the 15:15 rule:  Take 15 grams of a fast-acting carb (carbohydrate). Talk with your doctor about how much you should take.  Some fast-acting carbs are: ? Sugar tablets (glucose pills). Take 3-4 pills. ? 6-8 pieces of hard candy. ? 4-6 oz (120-150 mL) of fruit juice. ? 4-6 oz (120-150 mL) of regular (not diet) soda. ? 1 Tbsp (15 mL) honey or sugar.  Check your blood sugar 15 minutes after you take the carb.  If your blood sugar is still at or below 70 mg/dL (3.9 mmol/L), take 15 grams of a carb again.  If your blood sugar does not go above 70 mg/dL (3.9 mmol/L) after 3 tries, get help right away.  After your blood sugar goes back to normal, eat a meal or a snack within 1 hour. Treating very low blood sugar If your blood sugar is at or below 54 mg/dL (3 mmol/L), you have very low blood sugar (severe hypoglycemia). This is an emergency. Do not wait to see if the symptoms will go away. Get medical help right away. Call your local emergency services (911 in the [place] If you have very low blood sugar and you cannot eat or drink, you may need a glucagon shot (injection). A family member or  friend should learn how to check your blood sugar and how to give you a glucagon shot. Ask your doctor if you need to have a glucagon shot kit at home. Follow these instructions at home: Medicine  Take insulin and diabetes medicines as told.  If your doctor says you should take more or less insulin and medicines, do this exactly as told.  Do not run out of insulin or medicines. Having diabetes can raise your risk for other long-term conditions. These include heart disease and kidney disease. Your doctor may prescribe medicines to help  you not have these problems. Food   Make healthy food choices. These include: ? Chicken, fish, egg whites, and beans. ? Oats, whole wheat, bulgur, brown rice, quinoa, and millet. ? Fresh fruits and vegetables. ? Low-fat dairy products. ? Nuts, avocado, olive oil, and canola oil.  Meet with a food specialist (dietitian). He or she can help you make an eating plan that is right for you.  Follow instructions from your doctor about what you cannot eat or drink.  Drink enough fluid to keep your pee (urine) pale yellow.  Keep track of carbs that you eat. Do this by reading food labels and learning food serving sizes.  Follow your sick day plan when you cannot eat or drink normally. Make this plan with your doctor so it is ready to use. Activity  Exercise 3 or more times a week.  Do not go more than 2 days without exercising.  Talk with your doctor before you start a new exercise. Your doctor may need to tell you to change: ? How much insulin or medicines you take. ? How much food you eat. Lifestyle  Do not use any tobacco products. These include cigarettes, chewing tobacco, and e-cigarettes. If you need help quitting, ask your doctor.  Ask your doctor how much alcohol is safe for you.  Learn to deal with stress. If you need help with this, ask your doctor. Body care   Stay up to date with your shots (immunizations).  Have your eyes and feet  checked by a doctor as often as told.  Check your skin and feet every day. Check for cuts, bruises, redness, blisters, or sores.  Brush your teeth and gums two times a day. Floss one or more times a day.  Go to the dentist one or more times every 6 months.  Stay at a healthy weight. General instructions  Take over-the-counter and prescription medicines only as told by your doctor.  Share your diabetes care plan with: ? Your work or school. ? People you live with.  Carry a card or wear jewelry that says you have diabetes.  Keep all follow-up visits as told by your doctor. This is important. Questions to ask your doctor  Do I need to meet with a diabetes educator?  Where can I find a support group for people with diabetes? Where to find more information To learn more about diabetes, visit:  American Diabetes Association: www.diabetes.org  American Association of Diabetes Educators: www.diabeteseducator.org Summary  When you have type 2 diabetes, you must make sure your blood sugar (glucose) stays in a healthy range.  Check your blood sugar every day, as often as told.  Having diabetes can raise your risk for other conditions. Your doctor may prescribe medicines to help you not have these problems.  Keep all follow-up visits as told by your doctor. This is important. This information is not intended to replace advice given to you by your health care provider. Make sure you discuss any questions you have with your health care provider. Document Released: 07/02/2015 Document Revised: 08/31/2017 Document Reviewed: 04/13/2015 Elsevier Patient Education  2020 [place].  Blood pressure and blood sugar- both very good today! A1c- 7.4 Continue all medications as directed. Referral to Podiatry placed to address toenail pain-do not not try to remove toenail. Continue close follow-up with Oncology team. Continue social distance and wear a mask when in public. Follow-up in 3  months. GREAT TO SEE YOU!

## 2019-03-21 NOTE — Assessment & Plan Note (Signed)
Lab Results  Component Value Date   HGBA1C 7.4 (A) 03/21/2019   HGBA1C 7.0 (A) 12/13/2018   HGBA1C 8.0 (A) 08/18/2018   AM BG 130s, denies episodes of hypoglycemia Continue Novolin 70/30 10 U BID with meals

## 2019-03-21 NOTE — Assessment & Plan Note (Signed)
Blood pressure and blood sugar- both very good today! A1c- 7.4 Continue all medications as directed. Referral to Podiatry placed to address toenail pain-do not not try to remove toenail. Continue close follow-up with Oncology team. Continue social distance and wear a mask when in public. Follow-up in 3 months.

## 2019-03-24 ENCOUNTER — Telehealth: Payer: Self-pay | Admitting: Adult Health

## 2019-03-24 NOTE — Telephone Encounter (Signed)
Spoke with patient and advised her of the below. Patient states that she has an OBGYN apt on 03/30/19 and will have labs drawn there and declines lab visit for CBC in our office. Beverly Schwartz is aware. AS, CMA

## 2019-03-24 NOTE — Telephone Encounter (Signed)
Good Afternoon [name], Please call Beverly Schwartz and share Her last CBC on 01/28/2019 was stable- H/H 12.5/38.7, but that was prior to this onset of vaginal bleeding. Recommend that she follow-up with her OB/GYN for the vaginal bleeding and she can schedule a lab appt next week for [place] to re-check her CBC. Thanks! Valetta Fuller

## 2019-03-24 NOTE — Telephone Encounter (Signed)
Attempted to call patient. No answer of home phone and the voice mail box on cell is full. Unable to leave message. AS, CMA

## 2019-03-24 NOTE — Telephone Encounter (Signed)
Patient called requesting to speak with Valetta Fuller or Kenney Houseman, both were unavailable at the time of call. I asked patient what was going on that she needed to speak with clinic staff and she declined to elaborate. I advised patient that I would send a message to them and they would be contact with her when available.

## 2019-03-24 NOTE — Telephone Encounter (Signed)
Patient called stating that she has been bleeding vaginally for a little over a month now and forgot to mention this to you when she saw you last. She is concerned that she may be anemic and would like advise. Please advise. AS, CMA

## 2019-03-27 ENCOUNTER — Emergency Department (HOSPITAL_COMMUNITY): Payer: BC Managed Care – PPO

## 2019-03-27 ENCOUNTER — Encounter (HOSPITAL_COMMUNITY): Payer: Self-pay

## 2019-03-27 ENCOUNTER — Inpatient Hospital Stay (HOSPITAL_COMMUNITY)
Admission: EM | Admit: 2019-03-27 | Discharge: 2019-03-29 | DRG: 871 | Disposition: A | Discharge status: Home / Self Care | Payer: BC Managed Care – PPO | Attending: Internal Medicine | Admitting: Internal Medicine

## 2019-03-27 ENCOUNTER — Other Ambulatory Visit: Payer: Self-pay

## 2019-03-27 DIAGNOSIS — C19 Malignant neoplasm of rectosigmoid junction: Secondary | ICD-10-CM | POA: Diagnosis present

## 2019-03-27 DIAGNOSIS — F419 Anxiety disorder, unspecified: Secondary | ICD-10-CM | POA: Diagnosis present

## 2019-03-27 DIAGNOSIS — A419 Sepsis, unspecified organism: Secondary | ICD-10-CM | POA: Diagnosis not present

## 2019-03-27 DIAGNOSIS — K5900 Constipation, unspecified: Secondary | ICD-10-CM | POA: Diagnosis present

## 2019-03-27 DIAGNOSIS — G9340 Encephalopathy, unspecified: Secondary | ICD-10-CM | POA: Diagnosis present

## 2019-03-27 DIAGNOSIS — C787 Secondary malignant neoplasm of liver and intrahepatic bile duct: Secondary | ICD-10-CM | POA: Diagnosis present

## 2019-03-27 DIAGNOSIS — D849 Immunodeficiency, unspecified: Secondary | ICD-10-CM | POA: Diagnosis present

## 2019-03-27 DIAGNOSIS — Z9119 Patient's noncompliance with other medical treatment and regimen: Secondary | ICD-10-CM

## 2019-03-27 DIAGNOSIS — E119 Type 2 diabetes mellitus without complications: Secondary | ICD-10-CM | POA: Diagnosis present

## 2019-03-27 DIAGNOSIS — C785 Secondary malignant neoplasm of large intestine and rectum: Secondary | ICD-10-CM | POA: Diagnosis present

## 2019-03-27 DIAGNOSIS — N938 Other specified abnormal uterine and vaginal bleeding: Secondary | ICD-10-CM | POA: Diagnosis present

## 2019-03-27 DIAGNOSIS — C7989 Secondary malignant neoplasm of other specified sites: Secondary | ICD-10-CM | POA: Diagnosis present

## 2019-03-27 DIAGNOSIS — Z79899 Other long term (current) drug therapy: Secondary | ICD-10-CM

## 2019-03-27 DIAGNOSIS — G893 Neoplasm related pain (acute) (chronic): Secondary | ICD-10-CM | POA: Diagnosis present

## 2019-03-27 DIAGNOSIS — D72829 Elevated white blood cell count, unspecified: Secondary | ICD-10-CM | POA: Diagnosis not present

## 2019-03-27 DIAGNOSIS — X58XXXA Exposure to other specified factors, initial encounter: Secondary | ICD-10-CM | POA: Diagnosis present

## 2019-03-27 DIAGNOSIS — N390 Urinary tract infection, site not specified: Secondary | ICD-10-CM | POA: Diagnosis present

## 2019-03-27 DIAGNOSIS — G9341 Metabolic encephalopathy: Secondary | ICD-10-CM | POA: Diagnosis present

## 2019-03-27 DIAGNOSIS — I10 Essential (primary) hypertension: Secondary | ICD-10-CM | POA: Diagnosis not present

## 2019-03-27 DIAGNOSIS — Z888 Allergy status to other drugs, medicaments and biological substances status: Secondary | ICD-10-CM

## 2019-03-27 DIAGNOSIS — D6481 Anemia due to antineoplastic chemotherapy: Secondary | ICD-10-CM | POA: Diagnosis present

## 2019-03-27 DIAGNOSIS — E785 Hyperlipidemia, unspecified: Secondary | ICD-10-CM | POA: Diagnosis present

## 2019-03-27 DIAGNOSIS — T451X5A Adverse effect of antineoplastic and immunosuppressive drugs, initial encounter: Secondary | ICD-10-CM | POA: Diagnosis present

## 2019-03-27 DIAGNOSIS — E039 Hypothyroidism, unspecified: Secondary | ICD-10-CM | POA: Diagnosis present

## 2019-03-27 DIAGNOSIS — R4182 Altered mental status, unspecified: Secondary | ICD-10-CM

## 2019-03-27 DIAGNOSIS — Z880 Allergy status to penicillin: Secondary | ICD-10-CM

## 2019-03-27 DIAGNOSIS — Z8249 Family history of ischemic heart disease and other diseases of the circulatory system: Secondary | ICD-10-CM

## 2019-03-27 DIAGNOSIS — Z794 Long term (current) use of insulin: Secondary | ICD-10-CM

## 2019-03-27 DIAGNOSIS — Z8349 Family history of other endocrine, nutritional and metabolic diseases: Secondary | ICD-10-CM

## 2019-03-27 DIAGNOSIS — Z800 Family history of malignant neoplasm of digestive organs: Secondary | ICD-10-CM

## 2019-03-27 DIAGNOSIS — Z20822 Contact with and (suspected) exposure to covid-19: Secondary | ICD-10-CM | POA: Diagnosis present

## 2019-03-27 LAB — AMMONIA: Ammonia: 11 umol/L (ref 9–35)

## 2019-03-27 LAB — COMPREHENSIVE METABOLIC PANEL
ALT: 21 U/L (ref 0–44)
AST: 30 U/L (ref 15–41)
Albumin: 3.1 g/dL — ABNORMAL LOW (ref 3.5–5.0)
Alkaline Phosphatase: 271 U/L — ABNORMAL HIGH (ref 38–126)
Anion gap: 13 (ref 5–15)
BUN: 10 mg/dL (ref 6–20)
CO2: 22 mmol/L (ref 22–32)
Calcium: 8.6 mg/dL — ABNORMAL LOW (ref 8.9–10.3)
Chloride: 103 mmol/L (ref 98–111)
Creatinine, Ser: 0.62 mg/dL (ref 0.44–1.00)
GFR calc Af Amer: >60 mL/min (ref >60)
GFR calc non Af Amer: >60 mL/min (ref >60)
Glucose, Bld: 336 mg/dL — ABNORMAL HIGH (ref 70–99)
Potassium: 3.9 mmol/L (ref 3.5–5.1)
Sodium: 138 mmol/L (ref 135–145)
Total Bilirubin: 0.6 mg/dL (ref 0.3–1.2)
Total Protein: 6.9 g/dL (ref 6.5–8.1)

## 2019-03-27 LAB — CBC WITH DIFFERENTIAL/PLATELET
Abs Immature Granulocytes: 0.18 10*3/uL — ABNORMAL HIGH (ref 0.00–0.07)
Basophils Absolute: 0.1 10*3/uL (ref 0.0–0.1)
Basophils Relative: 0 %
Eosinophils Absolute: 0.1 10*3/uL (ref 0.0–0.5)
Eosinophils Relative: 1 %
HCT: 34.8 % — ABNORMAL LOW (ref 36.0–46.0)
Hemoglobin: 10.8 g/dL — ABNORMAL LOW (ref 12.0–15.0)
Immature Granulocytes: 1 %
Lymphocytes Relative: 9 %
Lymphs Abs: 1.8 10*3/uL (ref 0.7–4.0)
MCH: 28.7 pg (ref 26.0–34.0)
MCHC: 31 g/dL (ref 30.0–36.0)
MCV: 92.6 fL (ref 80.0–100.0)
Monocytes Absolute: 0.9 10*3/uL (ref 0.1–1.0)
Monocytes Relative: 4 %
Neutro Abs: 17.8 10*3/uL — ABNORMAL HIGH (ref 1.7–7.7)
Neutrophils Relative %: 85 %
Platelets: 313 10*3/uL (ref 150–400)
RBC: 3.76 MIL/uL — ABNORMAL LOW (ref 3.87–5.11)
RDW: 14 % (ref 11.5–15.5)
WBC: 20.8 10*3/uL — ABNORMAL HIGH (ref 4.0–10.5)
nRBC: 0 % (ref 0.0–0.2)

## 2019-03-27 LAB — CBG MONITORING, ED
Glucose-Capillary: 286 mg/dL — ABNORMAL HIGH (ref 70–99)
Glucose-Capillary: 276 mg/dL — ABNORMAL HIGH (ref 70–99)

## 2019-03-27 MED ORDER — ALPRAZOLAM 1 MG PO TABS
1.0000 mg | ORAL_TABLET | Freq: Every evening | ORAL | Status: DC | PRN
  Administered 2019-03-28: 1 mg via ORAL
  Filled 2019-03-27: qty 1

## 2019-03-27 MED ORDER — SODIUM CHLORIDE 0.9 % IV BOLUS
1000.0000 mL | Freq: Once | INTRAVENOUS | Status: AC
  Administered 2019-03-27: 1000 mL via INTRAVENOUS

## 2019-03-27 MED ORDER — ONDANSETRON HCL 4 MG PO TABS: 4.0000 mg | ORAL_TABLET | Freq: Four times a day (QID) | ORAL | Status: DC | PRN

## 2019-03-27 MED ORDER — INSULIN GLARGINE 100 UNIT/ML ~~LOC~~ SOLN
10.0000 [IU] | Freq: Every day | SUBCUTANEOUS | Status: DC
  Administered 2019-03-27 – 2019-03-28 (×2): 10 [IU] via SUBCUTANEOUS
  Filled 2019-03-27 (×3): qty 0.1

## 2019-03-27 MED ORDER — ONDANSETRON HCL 4 MG/2ML IJ SOLN: 4.0000 mg | Freq: Four times a day (QID) | INTRAMUSCULAR | Status: DC | PRN

## 2019-03-27 MED ORDER — ACETAMINOPHEN 650 MG RE SUPP: 650.0000 mg | Freq: Four times a day (QID) | RECTAL | Status: DC | PRN

## 2019-03-27 MED ORDER — SODIUM CHLORIDE 0.9 % IV SOLN
2.0000 g | Freq: Once | INTRAVENOUS | Status: AC
  Administered 2019-03-27: 2 g via INTRAVENOUS
  Filled 2019-03-27: qty 2

## 2019-03-27 MED ORDER — INSULIN ASPART 100 UNIT/ML ~~LOC~~ SOLN
0.0000 [IU] | Freq: Three times a day (TID) | SUBCUTANEOUS | Status: DC
  Administered 2019-03-28: 2 [IU] via SUBCUTANEOUS
  Administered 2019-03-28: 3 [IU] via SUBCUTANEOUS
  Administered 2019-03-28 – 2019-03-29 (×2): 2 [IU] via SUBCUTANEOUS
  Filled 2019-03-27: qty 0.15

## 2019-03-27 MED ORDER — POLYETHYLENE GLYCOL 3350 17 G PO PACK
17.0000 g | PACK | Freq: Every day | ORAL | Status: DC
  Administered 2019-03-27 – 2019-03-29 (×2): 17 g via ORAL
  Filled 2019-03-27 (×3): qty 1

## 2019-03-27 MED ORDER — ENOXAPARIN SODIUM 40 MG/0.4ML ~~LOC~~ SOLN
40.0000 mg | SUBCUTANEOUS | Status: DC
  Administered 2019-03-27: 40 mg via SUBCUTANEOUS
  Filled 2019-03-27: qty 0.4

## 2019-03-27 MED ORDER — ONDANSETRON HCL 4 MG/2ML IJ SOLN: 4.0000 mg | Freq: Once | INTRAMUSCULAR | Status: DC

## 2019-03-27 MED ORDER — IOHEXOL 300 MG/ML  SOLN
100.0000 mL | Freq: Once | INTRAMUSCULAR | Status: AC | PRN
  Administered 2019-03-27: 100 mL via INTRAVENOUS

## 2019-03-27 MED ORDER — BENZONATATE 100 MG PO CAPS: 100.0000 mg | ORAL_CAPSULE | Freq: Three times a day (TID) | ORAL | Status: DC | PRN

## 2019-03-27 MED ORDER — ACETAMINOPHEN 325 MG PO TABS: 650.0000 mg | ORAL_TABLET | Freq: Four times a day (QID) | ORAL | Status: DC | PRN

## 2019-03-27 MED ORDER — ACETAMINOPHEN-CODEINE #3 300-30 MG PO TABS: 1.0000 | ORAL_TABLET | Freq: Four times a day (QID) | ORAL | Status: DC | PRN

## 2019-03-27 MED ORDER — INSULIN ASPART 100 UNIT/ML ~~LOC~~ SOLN
0.0000 [IU] | Freq: Every day | SUBCUTANEOUS | Status: DC
  Administered 2019-03-27: 3 [IU] via SUBCUTANEOUS
  Filled 2019-03-27: qty 0.05

## 2019-03-27 MED ORDER — AMLODIPINE BESYLATE 10 MG PO TABS
10.0000 mg | ORAL_TABLET | Freq: Every day | ORAL | Status: DC
  Administered 2019-03-28 – 2019-03-29 (×2): 10 mg via ORAL
  Filled 2019-03-27: qty 1
  Filled 2019-03-27: qty 2

## 2019-03-27 MED ORDER — METRONIDAZOLE IN NACL 5-0.79 MG/ML-% IV SOLN
500.0000 mg | Freq: Once | INTRAVENOUS | Status: AC
  Administered 2019-03-27: 500 mg via INTRAVENOUS
  Filled 2019-03-27: qty 100

## 2019-03-27 MED ORDER — ONDANSETRON HCL 4 MG/2ML IJ SOLN
INTRAMUSCULAR | Status: AC
  Administered 2019-03-27: 4 mg via INTRAVENOUS
  Filled 2019-03-27: qty 2

## 2019-03-27 MED ORDER — SODIUM CHLORIDE (PF) 0.9 % IJ SOLN
INTRAMUSCULAR | Status: AC
  Filled 2019-03-27: qty 50

## 2019-03-27 MED ORDER — LEVOTHYROXINE SODIUM 112 MCG PO TABS
112.0000 ug | ORAL_TABLET | Freq: Every day | ORAL | Status: DC
  Administered 2019-03-28 – 2019-03-29 (×2): 112 ug via ORAL
  Filled 2019-03-27 (×3): qty 1

## 2019-03-27 MED ORDER — ALBUTEROL SULFATE HFA 108 (90 BASE) MCG/ACT IN AERS: 2.0000 | INHALATION_SPRAY | Freq: Four times a day (QID) | RESPIRATORY_TRACT | Status: DC | PRN

## 2019-03-27 MED ORDER — LISINOPRIL 5 MG PO TABS
5.0000 mg | ORAL_TABLET | Freq: Every day | ORAL | Status: DC
  Administered 2019-03-28 – 2019-03-29 (×2): 5 mg via ORAL
  Filled 2019-03-27 (×2): qty 1

## 2019-03-27 MED ORDER — ALBUTEROL SULFATE (2.5 MG/3ML) 0.083% IN NEBU: 2.5000 mg | INHALATION_SOLUTION | Freq: Four times a day (QID) | RESPIRATORY_TRACT | Status: DC | PRN

## 2019-03-27 NOTE — ED Provider Notes (Signed)
[facility] DEPT Provider Note   CSN: [id number] Arrival date & time: 03/27/19  1642     History No chief complaint on file.   Beverly Schwartz is a 49 y.o. female.  The history is provided by the patient, the EMS personnel, a parent and medical records. No language interpreter was used.   Beverly Schwartz is a 49 y.o. female who presents to the Emergency Department complaining of AMS.  Level V caveat due to AMS.  Hx is provided by EMS and the patient's mother. She has a history of stage IV colon cancer and has been living in a tiny home behind her mother's home. She has been feeling poorly over the last week with increased malaise. Today she developed vomiting and diarrhea and had worsening of her chronic abdominal pain. She did take one of her home pain medications. Later in the day she told her mother to call 911 because she felt like she was passing out. She did recently suffer from what is self-described as bronchitis. Due to bronchitis and the holidays that she missed her last dose of chemotherapy. No reports of fevers, chest pain. No significant shortness of breath but she does have a cough. No known COVID 19 exposures but there have been workers in the home. Symptoms are severe, constant, worsening.    Past Medical History:  Diagnosis Date  . Anxiety   . Cancer ([place])    colon, liver  . Chest pain 10/18/2018   Patient c/o chest pain right side yesterday intermitten lasting 3-4 hours.  [name], PA notified  . Colon cancer ([place])   . Complication of anesthesia   . Depression   . Diabetes mellitus   . Dizziness   . Family history of breast cancer   . Family history of stomach cancer   . Hyperlipidemia   . Hypertension   . Hypothyroidism   . Obesity   . PONV (postoperative nausea and vomiting)   . Tachycardia     Patient Active Problem List   Diagnosis Date Noted  . Acute encephalopathy 03/27/2019  . Leukocytosis 03/27/2019  . Pain  around toenail, left foot 03/21/2019  . Cough in adult 02/28/2019  . Skin tag 12/13/2018  . ADHD (attention deficit hyperactivity disorder) evaluation 12/13/2018  . Foot callus 12/13/2018  . Financial difficulties 12/13/2018  . Headache 07/23/2018  . Excessive cerumen in both ear canals 05/05/2018  . Acute respiratory infection 03/30/2018  . Genetic testing 08/10/2017  . Family history of breast cancer   . Family history of stomach cancer   . Goals of care, counseling/discussion 06/15/2017  . 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 SIRS (systemic inflammatory response syndrome) ([place]) 05/30/2017  . Healthcare maintenance 12/10/2016  . Elevated LFTs 12/10/2016  . Generalized abdominal pain 09/30/2016  . Anxiety 09/30/2016  . Sinusitis 09/03/2016  . Diabetes mellitus without complication ([place]) [id number]  . Hypothyroid 07/07/2016  . Hypertension 07/07/2016  . Hyperlipidemia 08/06/2010  . Tachycardia   . Chest pain   . Dizziness   . Obesity     Past Surgical History:  Procedure Laterality Date  . FLEXIBLE SIGMOIDOSCOPY N/A 05/31/2017   Procedure: FLEXIBLE SIGMOIDOSCOPY;  Surgeon: Clarene Essex, MD;  Location: [facility] ENDOSCOPY;  Service: Endoscopy;  Laterality: N/A;  . IR IMAGING GUIDED PORT INSERTION  10/19/2018  . WISDOM TOOTH EXTRACTION     age 52's     OB History   No obstetric history on file.  Family History  Problem Relation Age of Onset  . Heart disease Father   . Healthy Mother   . Hyperlipidemia Sister   . Healthy Brother   . Heart disease Paternal Uncle   . Heart attack Paternal Uncle   . Healthy Son   . Breast cancer Maternal Grandmother        d. in mid 4s  . Stomach cancer Paternal Grandfather        d. 67  . Colon cancer Neg Hx   . Esophageal cancer Neg Hx   . Rectal cancer Neg Hx   . Liver cancer Neg Hx     Social History   Tobacco Use  . Smoking status: Never Smoker  . Smokeless tobacco: Never Used  Substance Use  Topics  . Alcohol use: No  . Drug use: No    Home Medications Prior to Admission medications   Medication Sig Start Date End Date Taking? Authorizing Provider  acetaminophen-codeine (TYLENOL #3) 300-30 MG tablet Take 1 tablet by mouth every 6 (six) hours as needed for moderate pain. 03/03/19  Yes Truitt Merle, MD  albuterol (PROVENTIL HFA;VENTOLIN HFA) 108 (90 Base) MCG/ACT inhaler Inhale 2 puffs into the lungs every 6 (six) hours as needed for wheezing or shortness of breath. 03/30/18  Yes Danford, Berna Spare, NP  ALPRAZolam (XANAX) 1 MG tablet TAKE 1 TABLET BY MOUTH AT BEDTIME AS NEEDED FOR ANXIETY Patient taking differently: Take 1 mg by mouth at bedtime as needed for anxiety.  02/28/19  Yes Alla Feeling, NP  amLODipine (NORVASC) 10 MG tablet TAKE 1 TABLET BY MOUTH EVERY DAY Patient taking differently: Take 10 mg by mouth daily.  03/11/19  Yes Truitt Merle, MD  benzonatate (TESSALON) 100 MG capsule Take 1 capsule (100 mg total) by mouth 3 (three) times daily as needed for cough. 03/02/19  Yes Danford, Katy D, NP  CINNAMON PO Take 1 tablet by mouth daily.    Yes [provider]  fluticasone (FLONASE) 50 MCG/ACT nasal spray Place 1 spray into both nostrils daily. Patient taking differently: Place 1 spray into both nostrils daily as needed for allergies.  12/10/16  Yes Danford, Berna Spare, NP  insulin NPH-regular Human (NOVOLIN 70/30) (70-30) 100 UNIT/ML injection INJECT 10 UNITS WITH BREAKFAST, 10 UNITS WITH DINNER Patient taking differently: Inject 10 Units into the skin 2 (two) times daily with a meal.  12/13/18  Yes Danford, Valetta Fuller D, NP  Insulin Syringe-Needle U-100 (BD INSULIN SYRINGE U/F) 31G X 5/16" 1 ML MISC Inject twice a day as directed 10/11/18  Yes Danford, Valetta Fuller D, NP  levothyroxine (SYNTHROID, LEVOTHROID) 112 MCG tablet Take 1 tablet (112 mcg total) by mouth daily before breakfast. 05/06/18  Yes Danford, Katy D, NP  lisinopril (ZESTRIL) 5 MG tablet Take 1 tablet (5 mg total) by mouth daily.  12/23/18  Yes Danford, Berna Spare, NP  Omega-3 Fatty Acids (FISH OIL) 1000 MG CAPS Take 1,000 mg by mouth daily.    Yes [provider]  polyethylene glycol (MIRALAX / GLYCOLAX) packet Take 17 g by mouth daily. 06/01/17  Yes Florencia Reasons, MD  TURMERIC PO Take 1 capsule by mouth daily.    Yes [provider]    Allergies    Bupropion and Amoxicillin  Review of Systems   Review of Systems  All other systems reviewed and are negative.   Physical Exam Updated Vital Signs BP 135/90   Pulse (!) 107   Temp 97.9 F (36.6 C)  Resp 17   LMP 03/01/2019   SpO2 99%   Physical Exam Vitals and nursing note reviewed.  Constitutional:      Appearance: She is well-developed. She is ill-appearing.  HENT:     Head: Normocephalic and atraumatic.  Cardiovascular:     Rate and Rhythm: Regular rhythm. Tachycardia present.     Heart sounds: No murmur.  Pulmonary:     Effort: Pulmonary effort is normal. No respiratory distress.     Breath sounds: Normal breath sounds.  Abdominal:     Palpations: Abdomen is soft.     Tenderness: There is no guarding or rebound.     Comments: Moderate generalized abdominal tenderness  Musculoskeletal:        General: No tenderness.  Skin:    General: Skin is warm and dry.  Neurological:     Comments: Lethargic. Actively resist eye-opening. Does follow commands and weekly grips bilaterally and symmetrically. Wiggles toes bilaterally. Nods yes when asked if abdominal palpation is tender. Nonverbal.  Psychiatric:        Behavior: Behavior normal.     ED Results / Procedures / Treatments   Labs (all labs ordered are listed, but only abnormal results are displayed) Labs Reviewed  COMPREHENSIVE METABOLIC PANEL - Abnormal; Notable for the following components:      Result Value   Glucose, Bld 336 (*)    Calcium 8.6 (*)    Albumin 3.1 (*)    Alkaline Phosphatase 271 (*)    All other components within normal limits  CBC WITH DIFFERENTIAL/PLATELET -  Abnormal; Notable for the following components:   WBC 20.8 (*)    RBC 3.76 (*)    Hemoglobin 10.8 (*)    HCT 34.8 (*)    Neutro Abs 17.8 (*)    Abs Immature Granulocytes 0.18 (*)    All other components within normal limits  CBG MONITORING, ED - Abnormal; Notable for the following components:   Glucose-Capillary 286 (*)    All other components within normal limits  CBG MONITORING, ED - Abnormal; Notable for the following components:   Glucose-Capillary 276 (*)    All other components within normal limits  SARS CORONAVIRUS 2 (TAT 6-24 HRS)  AMMONIA  URINALYSIS, ROUTINE W REFLEX MICROSCOPIC  HIV ANTIBODY (ROUTINE TESTING W REFLEX)  CBC  COMPREHENSIVE METABOLIC PANEL    EKG EKG Interpretation  Date/Time:  Sunday March 27 2019 17:16:24 EST Ventricular Rate:  105 PR Interval:    QRS Duration: 99 QT Interval:  367 QTC Calculation: 485 R Axis:   -24 Text Interpretation: Sinus tachycardia Borderline left axis deviation RSR' in V1 or V2, probably normal variant Confirmed by Quintella Reichert [phone]) on 03/27/2019 5:19:04 PM   Radiology CT Head Wo Contrast  Result Date: 03/27/2019 CLINICAL DATA:  Altered mental status, history of colon carcinoma with metastatic disease EXAM: CT HEAD WITHOUT CONTRAST TECHNIQUE: Contiguous axial images were obtained from the base of the skull through the vertex without intravenous contrast. COMPARISON:  07/25/2018 FINDINGS: Brain: Chronic white matter ischemic changes are noted similar to that seen on the prior exam. Mild atrophic changes are again seen. No acute hemorrhage, acute infarction or space-occupying mass lesion is noted. Vascular: No hyperdense vessel or unexpected calcification. Skull: Normal. Negative for fracture or focal lesion. Sinuses/Orbits: No acute finding. Other: None. IMPRESSION: Chronic white matter ischemic changes and atrophic changes stable from the prior exam. Electronically Signed   By: Inez Catalina M.D.   On: 03/27/2019 17:47  CT Abdomen Pelvis W Contrast  Result Date: 03/27/2019 CLINICAL DATA:  Vomiting. History of colon cancer with liver metastasis. EXAM: CT ABDOMEN AND PELVIS WITH CONTRAST TECHNIQUE: Multidetector CT imaging of the abdomen and pelvis was performed using the standard protocol following bolus administration of intravenous contrast. CONTRAST:  135mL OMNIPAQUE IOHEXOL 300 MG/ML  SOLN COMPARISON:  03/08/2019 FINDINGS: Lower chest: Incompletely imaged central line. Clear lung bases. Normal heart size without pericardial or pleural effusion. Hepatobiliary: Significant progression of hepatic metastasis. An index high right hepatic lobe 6.9 x 5.8 cm lesion on 15/2 is increased from 3.8 x 3.6 cm on the prior exam (when remeasured). Index inferior right hepatic lobe lesion measures 4.3 cm on 32/2 versus 3.0 cm on the prior exam (when remeasured). Normal gallbladder, without biliary ductal dilatation. Pancreas: Normal, without mass or ductal dilatation. Spleen: Normal in size, without focal abnormality. Adrenals/Urinary Tract: Normal adrenal glands. Normal kidneys, without hydronephrosis. Normal urinary bladder. Stomach/Bowel: Normal stomach, without wall thickening. An area of underdistention in the sigmoid on 77/2, without evidence of obstruction. Colonic stool burden suggests constipation. Normal terminal ileum and appendix. Normal small bowel. Vascular/Lymphatic: Aortic and branch vessel atherosclerosis. Adenopathy including at the level of the celiac measures 1.3 cm on 26/2, new. An adjacent right-sided celiac node measures 1.2 cm on 30/2 versus 1.0 cm on the prior exam (when remeasured). No pelvic sidewall adenopathy. Reproductive: Normal uterus and adnexa. Other: No significant free fluid. No evidence of omental or peritoneal disease. Musculoskeletal: No acute osseous abnormality. IMPRESSION: 1. Significant progression of hepatic metastasis. 2. New and progressive abdominal adenopathy, consistent with nodal metastasis.  3. Possible constipation. No evidence of bowel obstruction or other acute superimposed process. 4. Area of underdistention involving the sigmoid colon may represent the site of treated primary. No well-defined residual mass or obstruction at the site. Electronically Signed   By: Abigail Miyamoto M.D.   On: 03/27/2019 19:59   DG Chest Port 1 View  Result Date: 03/27/2019 CLINICAL DATA:  Altered mental status EXAM: PORTABLE CHEST 1 VIEW COMPARISON:  05/31/2017 FINDINGS: A right-sided chest port is present with distal tip terminating at the level of the superior cavoatrial junction. Heart size is within normal limits. No focal airspace consolidation, pleural effusion, or pneumothorax. No acute osseous findings. IMPRESSION: No acute cardiopulmonary findings. Electronically Signed   By: Davina Poke D.O.   On: 03/27/2019 17:46    Procedures Procedures (including critical care time)  Medications Ordered in ED Medications  ondansetron (ZOFRAN) injection 4 mg (4 mg Intravenous Not Given 03/27/19 1750)  sodium chloride (PF) 0.9 % injection (has no administration in time range)  insulin glargine (LANTUS) injection 10 Units (10 Units Subcutaneous Given 03/27/19 2310)  insulin aspart (novoLOG) injection 0-15 Units (has no administration in time range)  insulin aspart (novoLOG) injection 0-5 Units (3 Units Subcutaneous Given 03/27/19 2308)  amLODipine (NORVASC) tablet 10 mg (has no administration in time range)  ALPRAZolam (XANAX) tablet 1 mg (has no administration in time range)  levothyroxine (SYNTHROID) tablet 112 mcg (has no administration in time range)  lisinopril (ZESTRIL) tablet 5 mg (has no administration in time range)  polyethylene glycol (MIRALAX / GLYCOLAX) packet 17 g (17 g Oral Given 03/27/19 2313)  acetaminophen-codeine (TYLENOL #3) 300-30 MG per tablet 1 tablet (has no administration in time range)  benzonatate (TESSALON) capsule 100 mg (has no administration in time range)  albuterol (PROVENTIL)  (2.5 MG/3ML) 0.083% nebulizer solution 2.5 mg (has no administration in time range)  acetaminophen (TYLENOL)  tablet 650 mg (has no administration in time range)    Or  acetaminophen (TYLENOL) suppository 650 mg (has no administration in time range)  ondansetron (ZOFRAN) tablet 4 mg (has no administration in time range)    Or  ondansetron (ZOFRAN) injection 4 mg (has no administration in time range)  enoxaparin (LOVENOX) injection 40 mg (40 mg Subcutaneous Given 03/27/19 2311)  sodium chloride 0.9 % bolus 1,000 mL (0 mLs Intravenous Stopped 03/27/19 2223)  ceFEPIme (MAXIPIME) 2 g in sodium chloride 0.9 % 100 mL IVPB (0 g Intravenous Stopped 03/27/19 2306)    And  metroNIDAZOLE (FLAGYL) IVPB 500 mg (500 mg Intravenous New Bag/Given 03/27/19 2318)  iohexol (OMNIPAQUE) 300 MG/ML solution 100 mL (100 mLs Intravenous Contrast Given 03/27/19 1918)    ED Course  I have reviewed the triage vital signs and the nursing notes.  Pertinent labs & imaging results that were available during my care of the patient were reviewed by me and considered in my medical decision making (see chart for details).    MDM Rules/Calculators/A&P                     Patient with history of metastatic colon cancer here for evaluation of altered mental status. She is lethargic but arouse is on examination. She has no focal neurologic deficits. CT head is negative for acute abnormality. CBC with leukocytosis. No clear source of infection but she was treated empirically pending workup for intra-abdominal source. CT abdomen pelvis was obtained, which demonstrates progression of her metastatic disease. On repeat assessment her mental status is improving but she is persistently somnolent. Plan to admit for further evaluation and treatment.  Final Clinical Impression(s) / ED Diagnoses Final diagnoses:  None    Rx / DC Orders ED Discharge Orders    None       Quintella Reichert, MD 03/28/19 [phone]

## 2019-03-27 NOTE — H&P (Signed)
History and Physical    ADLER LIBERATO [id number] DOB: 18-Mar-1971 DOA: 03/27/2019  PCP: Esaw Grandchild, NP  Patient coming from: Home  I have personally briefly reviewed patient's old medical records in [place]  Chief Complaint: AMS  HPI: Beverly Schwartz is a 49 y.o. female with medical history significant of DM2, metastatic colon CA with liver mets.  Patient missed last dose of chemo due to holidays and bronchitis.  Today she developed vomiting and diarrhea and had worsening of her chronic abdominal pain. She did take one of her home pain medications. Later in the day she told her mother to call 911 because she felt like she was passing out.  No significant shortness of breath but she does have a cough. No known COVID 19 exposures but there have been workers in the home. Symptoms are severe, constant, worsening.   ED Course: Afebrile, CXR neg, no O2 requirement.  WBC 20k.  CT abd/pelvis shows worsening of metastatic disease, and constipation.  Ammonia normal.  Got cefepime, flagyl, and 1L IVF bolus in ED.  UA pending.   Review of Systems: As per HPI, otherwise all review of systems negative.  Past Medical History:  Diagnosis Date  . Anxiety   . Cancer ([place])    colon, liver  . Chest pain 10/18/2018   Patient c/o chest pain right side yesterday intermitten lasting 3-4 hours.  [name], PA notified  . Colon cancer ([place])   . Complication of anesthesia   . Depression   . Diabetes mellitus   . Dizziness   . Family history of breast cancer   . Family history of stomach cancer   . Hyperlipidemia   . Hypertension   . Hypothyroidism   . Obesity   . PONV (postoperative nausea and vomiting)   . Tachycardia     Past Surgical History:  Procedure Laterality Date  . FLEXIBLE SIGMOIDOSCOPY N/A 05/31/2017   Procedure: FLEXIBLE SIGMOIDOSCOPY;  Surgeon: Clarene Essex, MD;  Location: [facility];  Service: Endoscopy;  Laterality: N/A;  . IR IMAGING GUIDED PORT  INSERTION  10/19/2018  . WISDOM TOOTH EXTRACTION     age 9's     reports that she has never smoked. She has never used smokeless tobacco. She reports that she does not drink alcohol or use drugs.  Allergies  Allergen Reactions  . Bupropion Other (See Comments)    suicidal ideations  . Amoxicillin Rash    Has patient had a PCN reaction causing immediate rash, facial/tongue/throat swelling, SOB or lightheadedness with hypotension: Yes Has patient had a PCN reaction causing severe rash involving mucus membranes or skin necrosis: No Has patient had a PCN reaction that required hospitalization: No Has patient had a PCN reaction occurring within the last 10 years: No If all of the above answers are "NO", then may proceed with Cephalosporin use.     Family History  Problem Relation Age of Onset  . Heart disease Father   . Healthy Mother   . Hyperlipidemia Sister   . Healthy Brother   . Heart disease Paternal Uncle   . Heart attack Paternal Uncle   . Healthy Son   . Breast cancer Maternal Grandmother        d. in mid 6s  . Stomach cancer Paternal Grandfather        d. 30  . Colon cancer Neg Hx   . Esophageal cancer Neg Hx   . Rectal cancer Neg Hx   . Liver  cancer Neg Hx      Prior to Admission medications   Medication Sig Start Date End Date Taking? Authorizing Provider  acetaminophen-codeine (TYLENOL #3) 300-30 MG tablet Take 1 tablet by mouth every 6 (six) hours as needed for moderate pain. 03/03/19   Truitt Merle, MD  albuterol (PROVENTIL HFA;VENTOLIN HFA) 108 (90 Base) MCG/ACT inhaler Inhale 2 puffs into the lungs every 6 (six) hours as needed for wheezing or shortness of breath. 03/30/18   Danford, Valetta Fuller D, NP  ALPRAZolam (XANAX) 1 MG tablet TAKE 1 TABLET BY MOUTH AT BEDTIME AS NEEDED FOR ANXIETY 02/28/19   Alla Feeling, NP  amLODipine (NORVASC) 10 MG tablet TAKE 1 TABLET BY MOUTH EVERY DAY 03/11/19   Truitt Merle, MD  benzonatate (TESSALON) 100 MG capsule Take 1 capsule (100 mg  total) by mouth 3 (three) times daily as needed for cough. 03/02/19   Danford, Valetta Fuller D, NP  CINNAMON PO Take 1 tablet by mouth daily.     [provider]  fluticasone (FLONASE) 50 MCG/ACT nasal spray Place 1 spray into both nostrils daily. Patient taking differently: Place 1 spray into both nostrils daily as needed for allergies.  12/10/16   Danford, Valetta Fuller D, NP  insulin NPH-regular Human (NOVOLIN 70/30) (70-30) 100 UNIT/ML injection INJECT 10 UNITS WITH BREAKFAST, 10 UNITS WITH DINNER 12/13/18   Danford, Valetta Fuller D, NP  Insulin Syringe-Needle U-100 (BD INSULIN SYRINGE U/F) 31G X 5/16" 1 ML MISC Inject twice a day as directed 10/11/18   Danford, Valetta Fuller D, NP  levothyroxine (SYNTHROID, LEVOTHROID) 112 MCG tablet Take 1 tablet (112 mcg total) by mouth daily before breakfast. 05/06/18   Danford, Valetta Fuller D, NP  lisinopril (ZESTRIL) 5 MG tablet Take 1 tablet (5 mg total) by mouth daily. 12/23/18   Danford, Valetta Fuller D, NP  Omega-3 Fatty Acids (FISH OIL) 1000 MG CAPS Take 1,000 mg by mouth daily.     [provider]  polyethylene glycol (MIRALAX / GLYCOLAX) packet Take 17 g by mouth daily. 06/01/17   Florencia Reasons, MD  TURMERIC PO Take 1 capsule by mouth daily.     [provider]    Physical Exam: Vitals:   03/27/19 1651 03/27/19 1656 03/27/19 2148  BP:  125/75   Pulse:  (!) 106   Resp:  16   Temp:  97.6 F (36.4 C) 97.9 F (36.6 C)  TempSrc:  Axillary   SpO2: 100% 99%     Constitutional: NAD, calm, comfortable Eyes: PERRL, lids and conjunctivae normal ENMT: Mucous membranes are moist. Posterior pharynx clear of any exudate or lesions.Normal dentition.  Neck: normal, supple, no masses, no thyromegaly Respiratory: clear to auscultation bilaterally, no wheezing, no crackles. Normal respiratory effort. No accessory muscle use.  Cardiovascular: Regular rate and rhythm, no murmurs / rubs / gallops. No extremity edema. 2+ pedal pulses. No carotid bruits.  Abdomen: no tenderness, no masses  palpated. No hepatosplenomegaly. Bowel sounds positive.  Musculoskeletal: no clubbing / cyanosis. No joint deformity upper and lower extremities. Good ROM, no contractures. Normal muscle tone.  Skin: no rashes, lesions, ulcers. No induration Neurologic: CN 2-12 grossly intact. Sensation intact, DTR normal. Strength 5/5 in all 4.  Psychiatric: Normal judgment and insight. Alert and oriented x 3. Normal mood.    Labs on Admission: I have personally reviewed following labs and imaging studies  CBC: Recent Labs  Lab 03/27/19 1709  WBC 20.8*  NEUTROABS 17.8*  HGB 10.8*  HCT 34.8*  MCV 92.6  PLT 313  Basic Metabolic Panel: Recent Labs  Lab 03/27/19 1709  NA 138  K 3.9  CL 103  CO2 22  GLUCOSE 336*  BUN 10  CREATININE 0.62  CALCIUM 8.6*   GFR: Estimated Creatinine Clearance: 86.6 mL/min (by C-G formula based on SCr of 0.62 mg/dL). Liver Function Tests: Recent Labs  Lab 03/27/19 1709  AST 30  ALT 21  ALKPHOS 271*  BILITOT 0.6  PROT 6.9  ALBUMIN 3.1*   No results for input(s): LIPASE, AMYLASE in the last 168 hours. Recent Labs  Lab 03/27/19 1710  AMMONIA 11   Coagulation Profile: No results for input(s): INR, PROTIME in the last 168 hours. Cardiac Enzymes: No results for input(s): CKTOTAL, CKMB, CKMBINDEX, TROPONINI in the last 168 hours. BNP (last 3 results) No results for input(s): PROBNP in the last 8760 hours. HbA1C: No results for input(s): HGBA1C in the last 72 hours. CBG: Recent Labs  Lab 03/27/19 1709 03/27/19 2135  GLUCAP 286* 276*   Lipid Profile: No results for input(s): CHOL, HDL, LDLCALC, TRIG, CHOLHDL, LDLDIRECT in the last 72 hours. Thyroid Function Tests: No results for input(s): TSH, T4TOTAL, FREET4, T3FREE, THYROIDAB in the last 72 hours. Anemia Panel: No results for input(s): VITAMINB12, FOLATE, FERRITIN, TIBC, IRON, RETICCTPCT in the last 72 hours. Urine analysis:    Component Value Date/Time   COLORURINE YELLOW 07/25/2018 1916     APPEARANCEUR HAZY (A) 07/25/2018 1916   LABSPEC 1.009 07/25/2018 1916   PHURINE 8.0 07/25/2018 1916   GLUCOSEU 150 (A) 07/25/2018 1916   HGBUR SMALL (A) 07/25/2018 1916   BILIRUBINUR NEGATIVE 07/25/2018 1916   KETONESUR NEGATIVE 07/25/2018 1916   PROTEINUR NEGATIVE 01/28/2019 0919   NITRITE NEGATIVE 07/25/2018 1916   LEUKOCYTESUR NEGATIVE 07/25/2018 1916    Radiological Exams on Admission: CT Head Wo Contrast  Result Date: 03/27/2019 CLINICAL DATA:  Altered mental status, history of colon carcinoma with metastatic disease EXAM: CT HEAD WITHOUT CONTRAST TECHNIQUE: Contiguous axial images were obtained from the base of the skull through the vertex without intravenous contrast. COMPARISON:  07/25/2018 FINDINGS: Brain: Chronic white matter ischemic changes are noted similar to that seen on the prior exam. Mild atrophic changes are again seen. No acute hemorrhage, acute infarction or space-occupying mass lesion is noted. Vascular: No hyperdense vessel or unexpected calcification. Skull: Normal. Negative for fracture or focal lesion. Sinuses/Orbits: No acute finding. Other: None. IMPRESSION: Chronic white matter ischemic changes and atrophic changes stable from the prior exam. Electronically Signed   By: Inez Catalina M.D.   On: 03/27/2019 17:47   CT Abdomen Pelvis W Contrast  Result Date: 03/27/2019 CLINICAL DATA:  Vomiting. History of colon cancer with liver metastasis. EXAM: CT ABDOMEN AND PELVIS WITH CONTRAST TECHNIQUE: Multidetector CT imaging of the abdomen and pelvis was performed using the standard protocol following bolus administration of intravenous contrast. CONTRAST:  160mL OMNIPAQUE IOHEXOL 300 MG/ML  SOLN COMPARISON:  03/08/2019 FINDINGS: Lower chest: Incompletely imaged central line. Clear lung bases. Normal heart size without pericardial or pleural effusion. Hepatobiliary: Significant progression of hepatic metastasis. An index high right hepatic lobe 6.9 x 5.8 cm lesion on 15/2 is  increased from 3.8 x 3.6 cm on the prior exam (when remeasured). Index inferior right hepatic lobe lesion measures 4.3 cm on 32/2 versus 3.0 cm on the prior exam (when remeasured). Normal gallbladder, without biliary ductal dilatation. Pancreas: Normal, without mass or ductal dilatation. Spleen: Normal in size, without focal abnormality. Adrenals/Urinary Tract: Normal adrenal glands. Normal kidneys, without hydronephrosis. Normal urinary  bladder. Stomach/Bowel: Normal stomach, without wall thickening. An area of underdistention in the sigmoid on 77/2, without evidence of obstruction. Colonic stool burden suggests constipation. Normal terminal ileum and appendix. Normal small bowel. Vascular/Lymphatic: Aortic and branch vessel atherosclerosis. Adenopathy including at the level of the celiac measures 1.3 cm on 26/2, new. An adjacent right-sided celiac node measures 1.2 cm on 30/2 versus 1.0 cm on the prior exam (when remeasured). No pelvic sidewall adenopathy. Reproductive: Normal uterus and adnexa. Other: No significant free fluid. No evidence of omental or peritoneal disease. Musculoskeletal: No acute osseous abnormality. IMPRESSION: 1. Significant progression of hepatic metastasis. 2. New and progressive abdominal adenopathy, consistent with nodal metastasis. 3. Possible constipation. No evidence of bowel obstruction or other acute superimposed process. 4. Area of underdistention involving the sigmoid colon may represent the site of treated primary. No well-defined residual mass or obstruction at the site. Electronically Signed   By: Abigail Miyamoto M.D.   On: 03/27/2019 19:59   DG Chest Port 1 View  Result Date: 03/27/2019 CLINICAL DATA:  Altered mental status EXAM: PORTABLE CHEST 1 VIEW COMPARISON:  05/31/2017 FINDINGS: A right-sided chest port is present with distal tip terminating at the level of the superior cavoatrial junction. Heart size is within normal limits. No focal airspace consolidation, pleural  effusion, or pneumothorax. No acute osseous findings. IMPRESSION: No acute cardiopulmonary findings. Electronically Signed   By: Davina Poke D.O.   On: 03/27/2019 17:46    EKG: Independently reviewed.  Assessment/Plan Principal Problem:   Acute encephalopathy Active Problems:   Diabetes mellitus without complication (HCC)   Hypertension   Malignant neoplasm of rectosigmoid junction (HCC)   Leukocytosis    1. Acute encephalopathy - 1. Improved / at baseline mental status now 2. Unclear etiology, ? Infection with the leukocytosis 2. Leukocytosis - 1. UA pending 2. Got empiric cefepime / flagyl in ED 3. But no other SIRS or infectious findings on CT abd/pelvis 4. Will hold off on further ABx for the moment pending UA results 5. Repeat CBC in AM 3. DM2 - 1. Hold 70/30 2. Lantus 10u QHS 3. And mod scale SSI AC/HS 4. HTN - 1. Continue home BP meds 5. Metastatic colon CA - 1. Worsening intra-abdominal mets on CT 2. Oncology consult in AM: Put consult into epic though not called yet.  DVT prophylaxis: Lovenox Code Status: Full Family Communication: Mother at bedside Disposition Plan: Home after admit Consults called: IP consult put into Epic to oncology Admission status: Place in [place], [place] Hospitalists  How to contact the [facility] Attending or Consulting provider [place] or covering provider during after hours [place], for this patient?  1. Check the care team in [facility] and look for a) attending/consulting [facility] provider listed and b) the [facility] team listed 2. Log into [email or web address]  Amion Physician Scheduling and messaging for groups and whole hospitals  On call and physician scheduling software for group practices, residents, hospitalists and other medical providers for call, clinic, rotation and shift schedules. On[employer] is a hospital-wide system for scheduling doctors and paging doctors on call. EasyPlot is for scientific plotting and data  analysis.  [email or web address]  and use [place]'s universal password to access. If you do not have the password, please contact the hospital operator.  3. Locate the [facility] provider you are looking for under [facility] and page to a number that you can be directly reached. 4. If you still have difficulty  reaching the provider, please page the [name] (Director on Call) for the Hospitalists listed on amion for assistance.  03/27/2019, 10:10 PM

## 2019-03-27 NOTE — ED Notes (Signed)
Patient transported to CT 

## 2019-03-27 NOTE — ED Triage Notes (Signed)
Per EMS, Pt is coming from home, pt is a colon and liver cancer. Normally have chemo every 2 wks. Has not had chemo in three weeks due to issues with bronchitis. Pt was alter mental status today, normally alert 4x, now just alert to voice. Pt did have nausea on scene but no vomiting.

## 2019-03-28 ENCOUNTER — Encounter (HOSPITAL_COMMUNITY): Payer: Self-pay | Admitting: Internal Medicine

## 2019-03-28 ENCOUNTER — Other Ambulatory Visit: Payer: Self-pay | Admitting: Adult Health

## 2019-03-28 DIAGNOSIS — X58XXXA Exposure to other specified factors, initial encounter: Secondary | ICD-10-CM | POA: Diagnosis present

## 2019-03-28 DIAGNOSIS — I10 Essential (primary) hypertension: Secondary | ICD-10-CM | POA: Diagnosis present

## 2019-03-28 DIAGNOSIS — Z9119 Patient's noncompliance with other medical treatment and regimen: Secondary | ICD-10-CM | POA: Diagnosis not present

## 2019-03-28 DIAGNOSIS — C785 Secondary malignant neoplasm of large intestine and rectum: Secondary | ICD-10-CM | POA: Diagnosis present

## 2019-03-28 DIAGNOSIS — F419 Anxiety disorder, unspecified: Secondary | ICD-10-CM | POA: Diagnosis present

## 2019-03-28 DIAGNOSIS — Z888 Allergy status to other drugs, medicaments and biological substances status: Secondary | ICD-10-CM | POA: Diagnosis not present

## 2019-03-28 DIAGNOSIS — Z794 Long term (current) use of insulin: Secondary | ICD-10-CM | POA: Diagnosis not present

## 2019-03-28 DIAGNOSIS — Z79899 Other long term (current) drug therapy: Secondary | ICD-10-CM | POA: Diagnosis not present

## 2019-03-28 DIAGNOSIS — Z8349 Family history of other endocrine, nutritional and metabolic diseases: Secondary | ICD-10-CM | POA: Diagnosis not present

## 2019-03-28 DIAGNOSIS — N390 Urinary tract infection, site not specified: Secondary | ICD-10-CM | POA: Diagnosis present

## 2019-03-28 DIAGNOSIS — Z880 Allergy status to penicillin: Secondary | ICD-10-CM | POA: Diagnosis not present

## 2019-03-28 DIAGNOSIS — C787 Secondary malignant neoplasm of liver and intrahepatic bile duct: Secondary | ICD-10-CM | POA: Diagnosis present

## 2019-03-28 DIAGNOSIS — G9340 Encephalopathy, unspecified: Secondary | ICD-10-CM

## 2019-03-28 DIAGNOSIS — N938 Other specified abnormal uterine and vaginal bleeding: Secondary | ICD-10-CM | POA: Diagnosis present

## 2019-03-28 DIAGNOSIS — G9341 Metabolic encephalopathy: Secondary | ICD-10-CM | POA: Diagnosis present

## 2019-03-28 DIAGNOSIS — D6481 Anemia due to antineoplastic chemotherapy: Secondary | ICD-10-CM | POA: Diagnosis present

## 2019-03-28 DIAGNOSIS — K5900 Constipation, unspecified: Secondary | ICD-10-CM | POA: Diagnosis present

## 2019-03-28 DIAGNOSIS — A419 Sepsis, unspecified organism: Secondary | ICD-10-CM | POA: Diagnosis present

## 2019-03-28 DIAGNOSIS — Z8249 Family history of ischemic heart disease and other diseases of the circulatory system: Secondary | ICD-10-CM | POA: Diagnosis not present

## 2019-03-28 DIAGNOSIS — C7989 Secondary malignant neoplasm of other specified sites: Secondary | ICD-10-CM | POA: Diagnosis present

## 2019-03-28 DIAGNOSIS — C19 Malignant neoplasm of rectosigmoid junction: Secondary | ICD-10-CM | POA: Diagnosis present

## 2019-03-28 DIAGNOSIS — Z20822 Contact with and (suspected) exposure to covid-19: Secondary | ICD-10-CM | POA: Diagnosis present

## 2019-03-28 DIAGNOSIS — E119 Type 2 diabetes mellitus without complications: Secondary | ICD-10-CM | POA: Diagnosis present

## 2019-03-28 DIAGNOSIS — E039 Hypothyroidism, unspecified: Secondary | ICD-10-CM | POA: Diagnosis present

## 2019-03-28 DIAGNOSIS — D849 Immunodeficiency, unspecified: Secondary | ICD-10-CM | POA: Diagnosis present

## 2019-03-28 DIAGNOSIS — Z800 Family history of malignant neoplasm of digestive organs: Secondary | ICD-10-CM | POA: Diagnosis not present

## 2019-03-28 LAB — URINALYSIS, ROUTINE W REFLEX MICROSCOPIC
Bacteria, UA: NONE SEEN
Bilirubin Urine: NEGATIVE
Glucose, UA: NEGATIVE mg/dL
Ketones, ur: NEGATIVE mg/dL
Leukocytes,Ua: NEGATIVE
Nitrite: NEGATIVE
Protein, ur: NEGATIVE mg/dL
RBC / HPF: >50 RBC/hpf — ABNORMAL HIGH (ref 0–5)
Specific Gravity, Urine: 1.009 (ref 1.005–1.030)
pH: 9 — ABNORMAL HIGH (ref 5.0–8.0)

## 2019-03-28 LAB — CBC
HCT: 33.8 % — ABNORMAL LOW (ref 36.0–46.0)
Hemoglobin: 10.4 g/dL — ABNORMAL LOW (ref 12.0–15.0)
MCH: 28.5 pg (ref 26.0–34.0)
MCHC: 30.8 g/dL (ref 30.0–36.0)
MCV: 92.6 fL (ref 80.0–100.0)
Platelets: 222 10*3/uL (ref 150–400)
RBC: 3.65 MIL/uL — ABNORMAL LOW (ref 3.87–5.11)
RDW: 14.1 % (ref 11.5–15.5)
WBC: 12.9 10*3/uL — ABNORMAL HIGH (ref 4.0–10.5)
nRBC: 0 % (ref 0.0–0.2)

## 2019-03-28 LAB — COMPREHENSIVE METABOLIC PANEL
ALT: 15 U/L (ref 0–44)
AST: 25 U/L (ref 15–41)
Albumin: 2.8 g/dL — ABNORMAL LOW (ref 3.5–5.0)
Alkaline Phosphatase: 227 U/L — ABNORMAL HIGH (ref 38–126)
Anion gap: 11 (ref 5–15)
BUN: 9 mg/dL (ref 6–20)
CO2: 25 mmol/L (ref 22–32)
Calcium: 8 mg/dL — ABNORMAL LOW (ref 8.9–10.3)
Chloride: 105 mmol/L (ref 98–111)
Creatinine, Ser: 0.5 mg/dL (ref 0.44–1.00)
GFR calc Af Amer: >60 mL/min (ref >60)
GFR calc non Af Amer: >60 mL/min (ref >60)
Glucose, Bld: 140 mg/dL — ABNORMAL HIGH (ref 70–99)
Potassium: 3.7 mmol/L (ref 3.5–5.1)
Sodium: 141 mmol/L (ref 135–145)
Total Bilirubin: 0.7 mg/dL (ref 0.3–1.2)
Total Protein: 6.3 g/dL — ABNORMAL LOW (ref 6.5–8.1)

## 2019-03-28 LAB — SARS CORONAVIRUS 2 (TAT 6-24 HRS): SARS Coronavirus 2: NEGATIVE

## 2019-03-28 LAB — GLUCOSE, CAPILLARY: Glucose-Capillary: 173 mg/dL — ABNORMAL HIGH (ref 70–99)

## 2019-03-28 LAB — HIV ANTIBODY (ROUTINE TESTING W REFLEX): HIV Screen 4th Generation wRfx: NONREACTIVE

## 2019-03-28 LAB — CBG MONITORING, ED
Glucose-Capillary: 132 mg/dL — ABNORMAL HIGH (ref 70–99)
Glucose-Capillary: 180 mg/dL — ABNORMAL HIGH (ref 70–99)
Glucose-Capillary: 142 mg/dL — ABNORMAL HIGH (ref 70–99)

## 2019-03-28 IMAGING — DX DG CHEST 2V
2 series · 2 of 2 positions shown · non-contrast
Comparison: None.

CLINICAL DATA: Fever

EXAM:
CHEST - 2 VIEW

[chest pa]
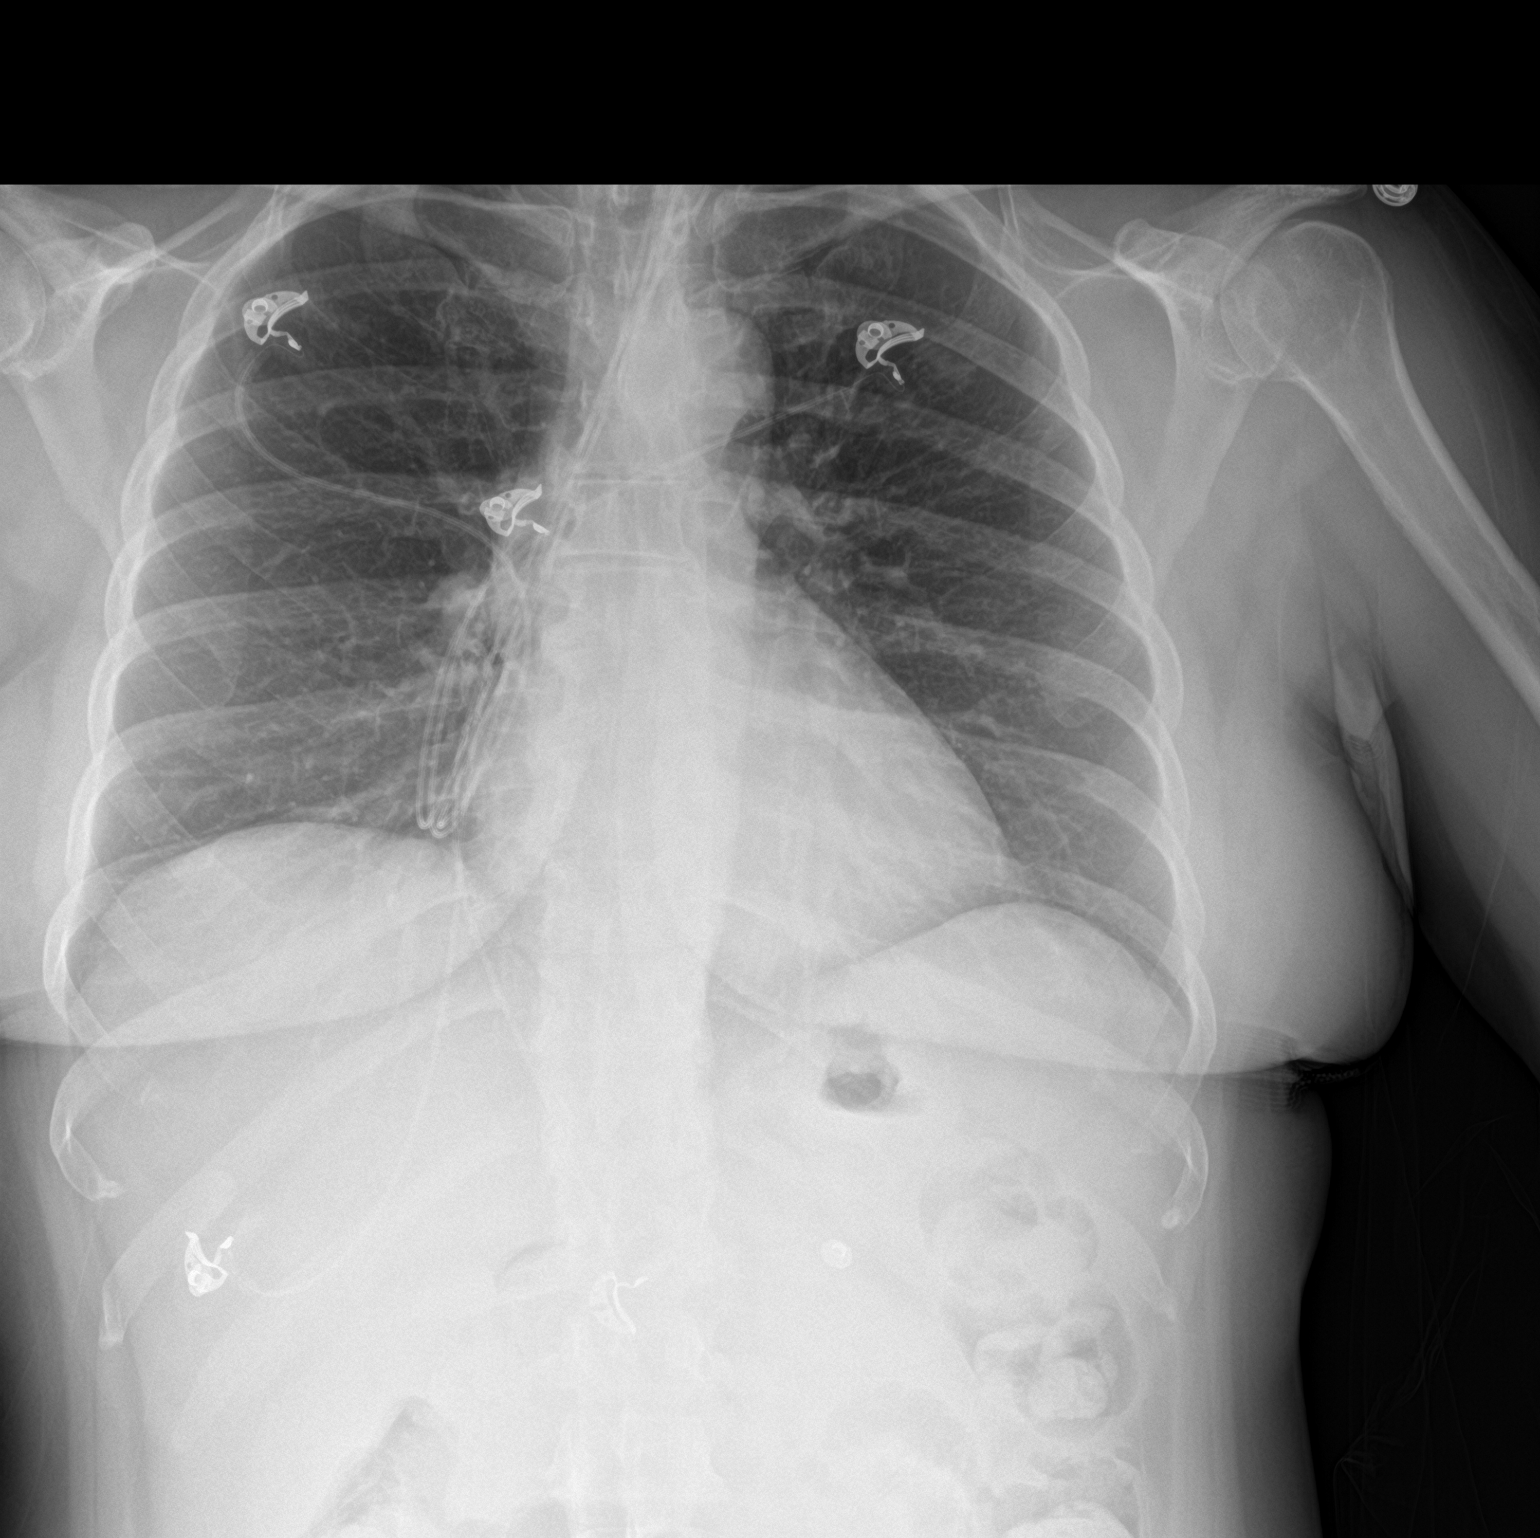

[chest lat]
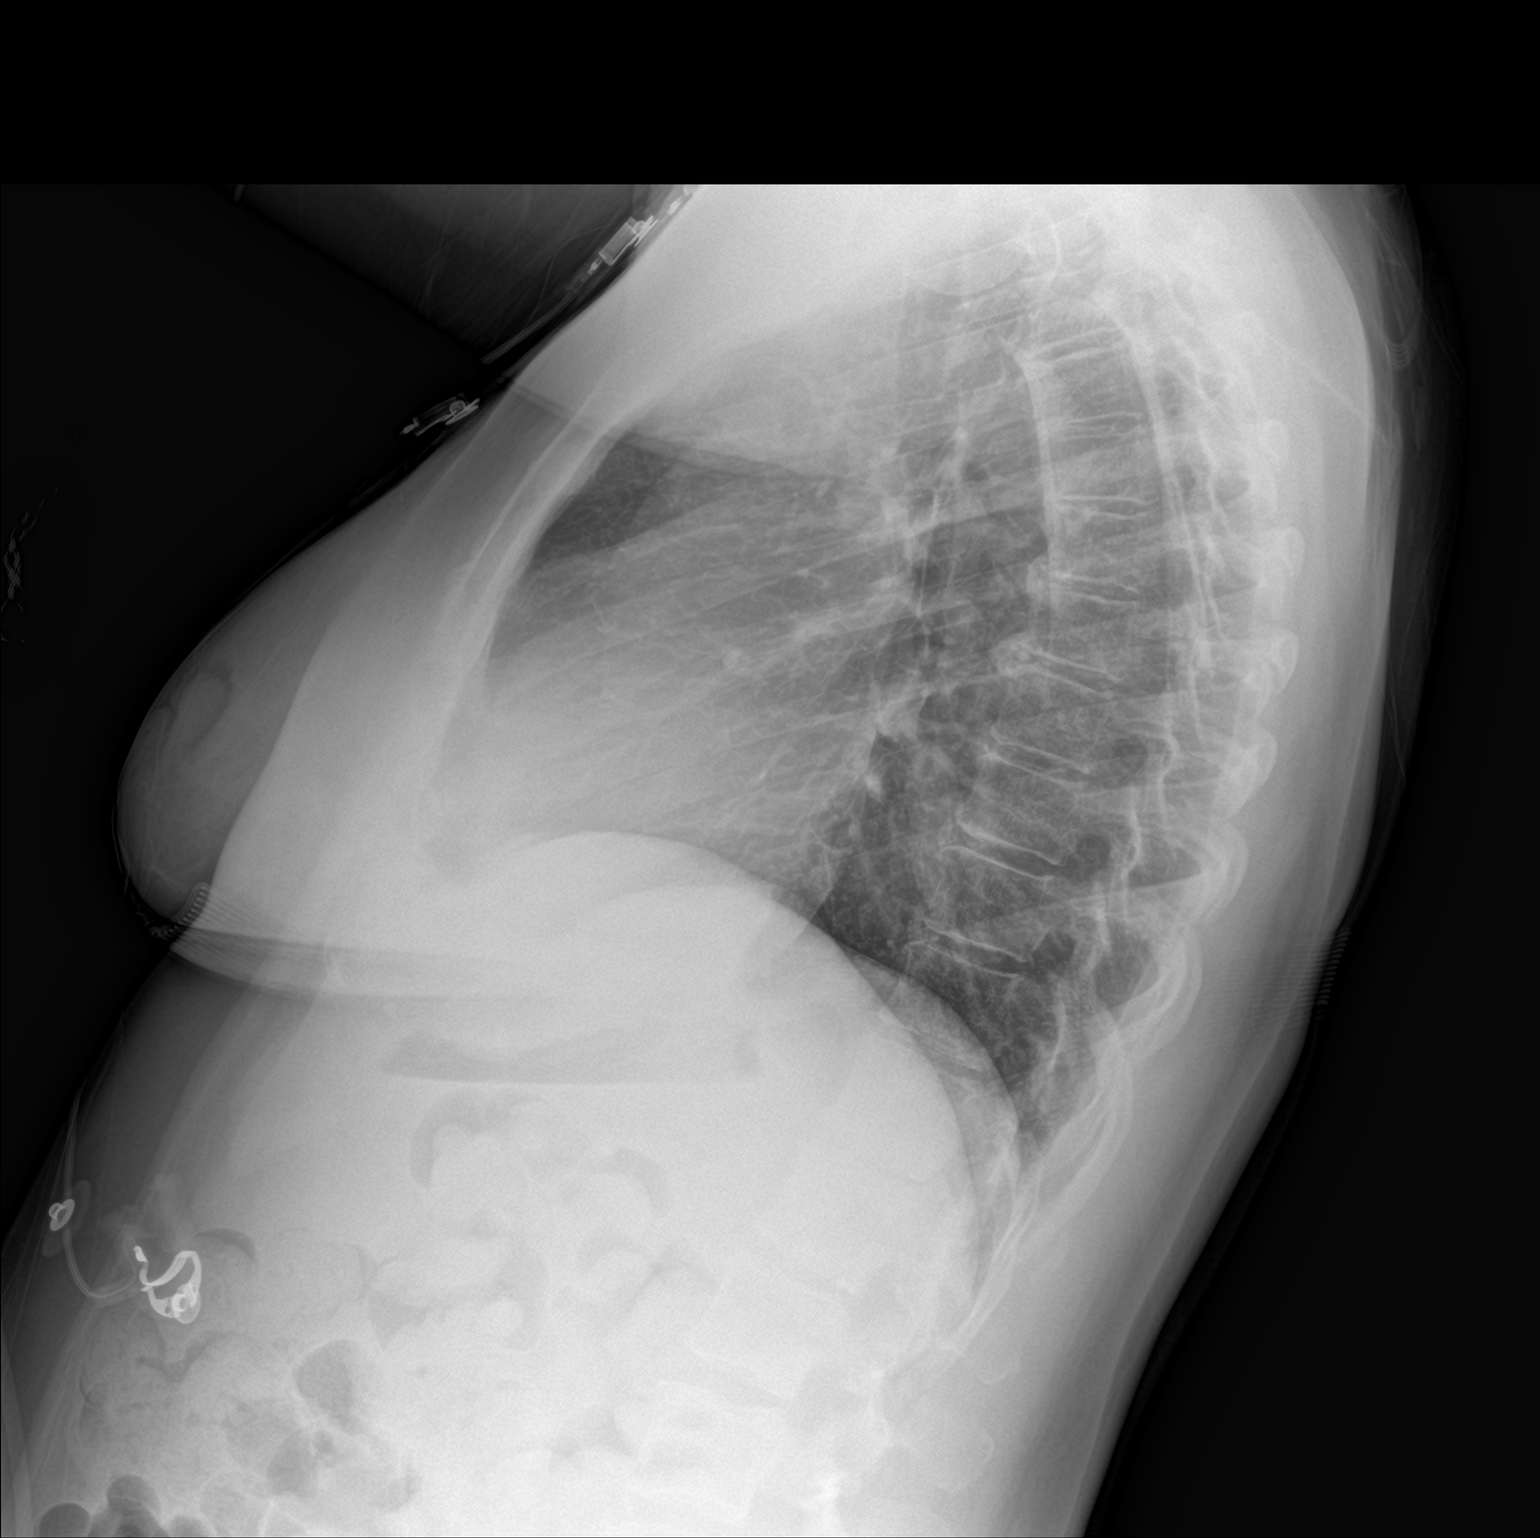

[2 of 2 positions shown; findings below may reference images not displayed]

FINDINGS: Heart and mediastinal contours are within normal limits. No focal
opacities or effusions. No acute bony abnormality.
IMPRESSION: No active cardiopulmonary disease.

## 2019-03-28 MED ORDER — SODIUM CHLORIDE 0.9 % IV SOLN: INTRAVENOUS | Status: DC

## 2019-03-28 MED ORDER — SODIUM CHLORIDE 0.9% FLUSH
10.0000 mL | Freq: Two times a day (BID) | INTRAVENOUS | Status: DC
  Administered 2019-03-28: 10 mL

## 2019-03-28 MED ORDER — SODIUM CHLORIDE 0.9 % IV SOLN
1.0000 g | INTRAVENOUS | Status: DC
  Administered 2019-03-28: 1 g via INTRAVENOUS
  Filled 2019-03-28 (×3): qty 10

## 2019-03-28 MED ORDER — CHLORHEXIDINE GLUCONATE CLOTH 2 % EX PADS
6.0000 | MEDICATED_PAD | Freq: Every day | CUTANEOUS | Status: DC
  Administered 2019-03-29 (×2): 6 via TOPICAL

## 2019-03-28 NOTE — ED Notes (Signed)
Pt is resting quietly in her room.  She is calm and cooperative.  IV lock was flushed.  Pt is in no distress.

## 2019-03-28 NOTE — Progress Notes (Signed)
Beverly Schwartz   DOB:1971/02/19   [id number]   [id number]  Oncology follow up note   Subjective: Patient is well-known to me, under my care for her metastatic colon cancer.  She is not very compliant with her treatment, multiple chemo treatment she presented to emergency room after syncope episode, and some confusion. She also complains nausea, vomiting, and intermittent abdominal pain.  She had upper respiratory symptoms a few weeks ago, resolved now, Covid test was negative.   Objective:  Vitals:   03/28/19 0939 03/28/19 1355  BP: 133/82 131/90  Pulse: 98 97  Resp: 18 17  Temp:  98.6 F (37 C)  SpO2: 98% 100%    There is no height or weight on file to calculate BMI.  Intake/Output Summary (Last 24 hours) at 03/28/2019 1727 Last data filed at 03/27/2019 2306 Gross per 24 hour  Intake 1100 ml  Output -  Net 1100 ml     Sclerae unicteric  Oropharynx clear  No peripheral adenopathy  Lungs clear -- no rales or rhonchi  Heart regular rate and rhythm  Abdomen soft, (+) RUQ tenderness   MSK no focal spinal tenderness, no peripheral edema  Neuro nonfocal    CBG (last 3)  Recent Labs    03/28/19 0919 03/28/19 1224 03/28/19 1619  GLUCAP 132* 180* 142*     Labs:  Urine Studies No results for input(s): UHGB, CRYS in the last 72 hours.  Invalid input(s): UACOL, UAPR, USPG, UPH, UTP, UGL, UKET, UBIL, UNIT, UROB, ULEU, UEPI, UWBC, URBC, UBAC, CAST, UCOM, BILUA  Basic Metabolic Panel: Recent Labs  Lab 03/27/19 1709 03/28/19 0634  NA 138 141  K 3.9 3.7  CL 103 105  CO2 22 25  GLUCOSE 336* 140*  BUN 10 9  CREATININE 0.62 0.50  CALCIUM 8.6* 8.0*   GFR Estimated Creatinine Clearance: 86.6 mL/min (by C-G formula based on SCr of 0.5 mg/dL). Liver Function Tests: Recent Labs  Lab 03/27/19 1709 03/28/19 0634  AST 30 25  ALT 21 15  ALKPHOS 271* 227*  BILITOT 0.6 0.7  PROT 6.9 6.3*  ALBUMIN 3.1* 2.8*   No results for input(s): LIPASE, AMYLASE in the last  168 hours. Recent Labs  Lab 03/27/19 1710  AMMONIA 11   Coagulation profile No results for input(s): INR, PROTIME in the last 168 hours.  CBC: Recent Labs  Lab 03/27/19 1709 03/28/19 0634  WBC 20.8* 12.9*  NEUTROABS 17.8*  --   HGB 10.8* 10.4*  HCT 34.8* 33.8*  MCV 92.6 92.6  PLT 313 222   Cardiac Enzymes: No results for input(s): CKTOTAL, CKMB, CKMBINDEX, TROPONINI in the last 168 hours. BNP: Invalid input(s): POCBNP CBG: Recent Labs  Lab 03/27/19 1709 03/27/19 2135 03/28/19 0919 03/28/19 1224 03/28/19 1619  GLUCAP 286* 276* 132* 180* 142*   D-Dimer No results for input(s): DDIMER in the last 72 hours. Hgb A1c No results for input(s): HGBA1C in the last 72 hours. Lipid Profile No results for input(s): CHOL, HDL, LDLCALC, TRIG, CHOLHDL, LDLDIRECT in the last 72 hours. Thyroid function studies No results for input(s): TSH, T4TOTAL, T3FREE, THYROIDAB in the last 72 hours.  Invalid input(s): FREET3 Anemia work up No results for input(s): VITAMINB12, FOLATE, FERRITIN, TIBC, IRON, RETICCTPCT in the last 72 hours. Microbiology Recent Results (from the past 240 hour(s))  SARS CORONAVIRUS 2 (TAT 6-24 HRS) Nasopharyngeal Nasopharyngeal Swab     Status: None   Collection Time: 03/27/19 11:21 PM   Specimen: Nasopharyngeal Swab  Result Value  Ref Range Status   SARS Coronavirus 2 NEGATIVE NEGATIVE Final    Comment: (NOTE) SARS-CoV-2 target nucleic acids are NOT DETECTED. The SARS-CoV-2 RNA is generally detectable in upper and lower respiratory specimens during the acute phase of infection. Negative results do not preclude SARS-CoV-2 infection, do not rule out co-infections with other pathogens, and should not be used as the sole basis for treatment or other patient management decisions. Negative results must be combined with clinical observations, patient history, and epidemiological information. The expected result is Negative. Fact Sheet for  Patients: SugarRoll.be Fact Sheet for Healthcare Providers: https://www.woods-mathews.com/ This test is not yet approved or cleared by the [place] FDA and  has been authorized for detection and/or diagnosis of SARS-CoV-2 by FDA under an Emergency Use Authorization (EUA). This EUA will remain  in effect (meaning this test can be used) for the duration of the COVID-19 declaration under Section 56 4(b)(1) of the Act, 21 U.S.C. section 360bbb-3(b)(1), unless the authorization is terminated or revoked sooner. Performed at [facility], [place] [address], [place], [place] [address]       Studies:  CT Head Wo Contrast  Result Date: 03/27/2019 CLINICAL DATA:  Altered mental status, history of colon carcinoma with metastatic disease EXAM: CT HEAD WITHOUT CONTRAST TECHNIQUE: Contiguous axial images were obtained from the base of the skull through the vertex without intravenous contrast. COMPARISON:  07/25/2018 FINDINGS: Brain: Chronic white matter ischemic changes are noted similar to that seen on the prior exam. Mild atrophic changes are again seen. No acute hemorrhage, acute infarction or space-occupying mass lesion is noted. Vascular: No hyperdense vessel or unexpected calcification. Skull: Normal. Negative for fracture or focal lesion. Sinuses/Orbits: No acute finding. Other: None. IMPRESSION: Chronic white matter ischemic changes and atrophic changes stable from the prior exam. Electronically Signed   By: Inez Catalina M.D.   On: 03/27/2019 17:47   CT Abdomen Pelvis W Contrast  Result Date: 03/27/2019 CLINICAL DATA:  Vomiting. History of colon cancer with liver metastasis. EXAM: CT ABDOMEN AND PELVIS WITH CONTRAST TECHNIQUE: Multidetector CT imaging of the abdomen and pelvis was performed using the standard protocol following bolus administration of intravenous contrast. CONTRAST:  151mL OMNIPAQUE IOHEXOL 300 MG/ML  SOLN COMPARISON:  03/08/2019  FINDINGS: Lower chest: Incompletely imaged central line. Clear lung bases. Normal heart size without pericardial or pleural effusion. Hepatobiliary: Significant progression of hepatic metastasis. An index high right hepatic lobe 6.9 x 5.8 cm lesion on 15/2 is increased from 3.8 x 3.6 cm on the prior exam (when remeasured). Index inferior right hepatic lobe lesion measures 4.3 cm on 32/2 versus 3.0 cm on the prior exam (when remeasured). Normal gallbladder, without biliary ductal dilatation. Pancreas: Normal, without mass or ductal dilatation. Spleen: Normal in size, without focal abnormality. Adrenals/Urinary Tract: Normal adrenal glands. Normal kidneys, without hydronephrosis. Normal urinary bladder. Stomach/Bowel: Normal stomach, without wall thickening. An area of underdistention in the sigmoid on 77/2, without evidence of obstruction. Colonic stool burden suggests constipation. Normal terminal ileum and appendix. Normal small bowel. Vascular/Lymphatic: Aortic and branch vessel atherosclerosis. Adenopathy including at the level of the celiac measures 1.3 cm on 26/2, new. An adjacent right-sided celiac node measures 1.2 cm on 30/2 versus 1.0 cm on the prior exam (when remeasured). No pelvic sidewall adenopathy. Reproductive: Normal uterus and adnexa. Other: No significant free fluid. No evidence of omental or peritoneal disease. Musculoskeletal: No acute osseous abnormality. IMPRESSION: 1. Significant progression of hepatic metastasis. 2. New and progressive abdominal adenopathy, consistent  with nodal metastasis. 3. Possible constipation. No evidence of bowel obstruction or other acute superimposed process. 4. Area of underdistention involving the sigmoid colon may represent the site of treated primary. No well-defined residual mass or obstruction at the site. Electronically Signed   By: Abigail Miyamoto M.D.   On: 03/27/2019 19:59   DG Chest Port 1 View  Result Date: 03/27/2019 CLINICAL DATA:  Altered mental  status EXAM: PORTABLE CHEST 1 VIEW COMPARISON:  05/31/2017 FINDINGS: A right-sided chest port is present with distal tip terminating at the level of the superior cavoatrial junction. Heart size is within normal limits. No focal airspace consolidation, pleural effusion, or pneumothorax. No acute osseous findings. IMPRESSION: No acute cardiopulmonary findings. Electronically Signed   By: Davina Poke D.O.   On: 03/27/2019 17:46    Assessment: 49 y.o. female   1.  Syncope and confusion, resolved  2.  Suspected sepsis from UTI, on IV antibiotics 3.  Metastatic colon cancer, on chemotherapy but not compliant 4. DM 5. HTN 6. Vaginal bleeding 7. 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  Abdominal pain from metastatic cancer to liver   Plan:  -I reviewed her CT abdomen from yesterday in person, and discussed with patient.  Unfortunately she has significant progression in liver, and abdominal adenopathy, since 2 weeks ago. I have previously recommended her to start irinotecan and vectibix, she is scheduled to start this Wednesday.  We discussed her overall poor prognosis, due to her rapid disease progression, her overall declining PS, and compliance issue. She voiced good understanding of her prognosis, and would like to restart chemo as soon as possible.  -I discussed code status with her and recommend DNR, she wishes to remain full code for now.  -We also discussed palliative care and hospice, if her overall condition declines further, or if she is not able to tolerate chemo -I agree with antibiotics, management per primary hospitalist team. I appreciate their excellent care.  -I called her mother and left her a  VM -our chaplain Lattie Haw will also call her to give her spiritual support.  -I will f/u as needed. Discharge per primary team. Will see her back soon after discharge    Truitt Merle, MD 03/28/2019  5:27 PM

## 2019-03-28 NOTE — Progress Notes (Signed)
Report given to receiving RN. Pt to transport to room 1336.

## 2019-03-28 NOTE — ED Notes (Signed)
Patient was stating her IV was not comfortable and Edie, RN reported saline was coming around the site. Went to bedside, patients IV is intact and flushed with difficulties. Will continue to monitor patients IV. Will obtain blood cultures and initiate fluids once she has finished eating since her dinner tray is available.

## 2019-03-28 NOTE — Progress Notes (Signed)
This nurse attempt lab draw x 2. Unsuccessful. Will ask another nurse to attempt.

## 2019-03-28 NOTE — Progress Notes (Signed)
PROGRESS NOTE    Beverly Schwartz  [id number] DOB: [birth year] DOA: 03/27/2019 PCP: Mina Marble D, NP   Brief Narrative: Patient is a 49 year old female with history of diabetes type 2, metastatic colon cancer with liver mets who presented with nausea, vomiting, abdominal pain, confusion from home.  She lives with her mother.  On presentation she was found to be afebrile.  Chest x-ray did not show pneumonia.  Elevated white cell counts on presentation.  CT abdomen/pelvis showed worsening metastatic disease in the liver, constipation.  She was started on IV antibiotics, IV fluids.  Oncology consulted today.  Assessment & Plan:   Principal Problem:   Acute encephalopathy Active Problems:   Diabetes mellitus without complication (HCC)   Hypertension   Malignant neoplasm of rectosigmoid junction (HCC)   Leukocytosis   Altered mental status: Currently alert and oriented.  Still appears weak.  Confusion could be associated with UTI.  Continue to monitor.  Mental status has improved.  Suspected sepsis/suspected urinary tract infection: Presented with leukocytosis.  Started on ceftriaxone.  Gentle IV fluids.  We will follow up UA, urine culture, blood culture.  She has high risk sepsis given her immunocompromise status.  Diabetes type 2: On Lantus and sliding scale insulin.  Hypertension: Currently blood pressure stable.  Continue home medicines  Abdominal pain: Much better now.  CT also showed constipation.  Continue bowel regimen.  Metastatic colon cancer: CT abdomen/pelvis showed worsening intra-abdominal mets with liver masses, lymph nodes.  No definitive colon mass.  Oncology consulted.  Follows with Dr. Burr Medico.  Currently not on active chemotherapy.  Vaginal bleeding: Patient states she has irregular vaginal bleed.  CT abdomen/pelvis did not show any intrauterine abnormalities.  Likely due to DUB.  Follow-up with GYN as an outpatient.  Hemoglobin is stable this morning.  Patient is  immunocompromised and  presented with leukocytosis.  She has been started on IV antibiotics due to risks of infection.  Unsafe to DC on oral antibiotics.  She also needs IV fluids because of nausea, vomiting and poor oral intake.  We will move the patient to inpatient status.           DVT prophylaxis:SCD Code Status: Full code Family Communication: called mother on phone,call not received Disposition Plan: Likely home tomorrow   Consultants: Oncology  Procedures: None  Antimicrobials:  Anti-infectives (From admission, onward)   Start     Dose/Rate Route Frequency Ordered Stop   03/28/19 1215  cefTRIAXone (ROCEPHIN) 1 g in sodium chloride 0.9 % 100 mL IVPB     1 g 200 mL/hr over 30 Minutes Intravenous Every 24 hours 03/28/19 1207     03/27/19 1915  ceFEPIme (MAXIPIME) 2 g in sodium chloride 0.9 % 100 mL IVPB     2 g 200 mL/hr over 30 Minutes Intravenous  Once 03/27/19 1904 03/27/19 2306   03/27/19 1915  metroNIDAZOLE (FLAGYL) IVPB 500 mg     500 mg 100 mL/hr over 60 Minutes Intravenous  Once 03/27/19 1904 03/28/19 0018      Subjective:  Patient seen and examined the bedside this morning.  Hemodynamically stable.  Complains of weakness, nausea.  Abdominal pain is better.  She is having vaginal bleeding.  Mental status has improved.  Objective: Vitals:   03/28/19 0030 03/28/19 0617 03/28/19 0939 03/28/19 1355  BP: 129/90 127/74 133/82 131/90  Pulse: (!) 103 90 98 97  Resp: 19 19 18 17   Temp:  98.3 F (36.8 C)  98.6 F (37 C)  TempSrc:  Oral  Oral  SpO2: 99% 90% 98% 100%    Intake/Output Summary (Last 24 hours) at 03/28/2019 1450 Last data filed at 03/27/2019 2306 Gross per 24 hour  Intake 1100 ml  Output -  Net 1100 ml   There were no vitals filed for this visit.  Examination:  General exam:Not in distress,average built HEENT:PERRL,Oral mucosa moist, Ear/Nose normal on gross exam Respiratory system: Bilateral equal air entry, normal vesicular breath sounds,  no wheezes or crackles  Cardiovascular system: S1 & S2 heard, RRR. No JVD, murmurs, rubs, gallops or clicks. No pedal edema. Gastrointestinal system: Abdomen is nondistended, soft and nontender. No organomegaly or masses felt. Normal bowel sounds heard. Central nervous system: Alert and oriented. No focal neurological deficits. Extremities: No edema, no clubbing ,no cyanosis, distal peripheral pulses palpable. Skin: No rashes, lesions or ulcers,no icterus ,no pallor    Data Reviewed: I have personally reviewed following labs and imaging studies  CBC: Recent Labs  Lab 03/27/19 1709 03/28/19 0634  WBC 20.8* 12.9*  NEUTROABS 17.8*  --   HGB 10.8* 10.4*  HCT 34.8* 33.8*  MCV 92.6 92.6  PLT 313 [id number]   Basic Metabolic Panel: Recent Labs  Lab 03/27/19 1709 03/28/19 0634  NA 138 141  K 3.9 3.7  CL 103 105  CO2 22 25  GLUCOSE 336* 140*  BUN 10 9  CREATININE 0.62 0.50  CALCIUM 8.6* 8.0*   GFR: Estimated Creatinine Clearance: 86.6 mL/min (by C-G formula based on SCr of 0.5 mg/dL). Liver Function Tests: Recent Labs  Lab 03/27/19 1709 03/28/19 0634  AST 30 25  ALT 21 15  ALKPHOS 271* 227*  BILITOT 0.6 0.7  PROT 6.9 6.3*  ALBUMIN 3.1* 2.8*   No results for input(s): LIPASE, AMYLASE in the last 168 hours. Recent Labs  Lab 03/27/19 1710  AMMONIA 11   Coagulation Profile: No results for input(s): INR, PROTIME in the last 168 hours. Cardiac Enzymes: No results for input(s): CKTOTAL, CKMB, CKMBINDEX, TROPONINI in the last 168 hours. BNP (last 3 results) No results for input(s): PROBNP in the last 8760 hours. HbA1C: No results for input(s): HGBA1C in the last 72 hours. CBG: Recent Labs  Lab 03/27/19 1709 03/27/19 2135 03/28/19 0919 03/28/19 1224  GLUCAP 286* 276* 132* 180*   Lipid Profile: No results for input(s): CHOL, HDL, LDLCALC, TRIG, CHOLHDL, LDLDIRECT in the last 72 hours. Thyroid Function Tests: No results for input(s): TSH, T4TOTAL, FREET4, T3FREE,  THYROIDAB in the last 72 hours. Anemia Panel: No results for input(s): VITAMINB12, FOLATE, FERRITIN, TIBC, IRON, RETICCTPCT in the last 72 hours. Sepsis Labs: No results for input(s): PROCALCITON, LATICACIDVEN in the last 168 hours.  Recent Results (from the past 240 hour(s))  SARS CORONAVIRUS 2 (TAT 6-24 HRS) Nasopharyngeal Nasopharyngeal Swab     Status: None   Collection Time: 03/27/19 11:21 PM   Specimen: Nasopharyngeal Swab  Result Value Ref Range Status   SARS Coronavirus 2 NEGATIVE NEGATIVE Final    Comment: (NOTE) SARS-CoV-2 target nucleic acids are NOT DETECTED. The SARS-CoV-2 RNA is generally detectable in upper and lower respiratory specimens during the acute phase of infection. Negative results do not preclude SARS-CoV-2 infection, do not rule out co-infections with other pathogens, and should not be used as the sole basis for treatment or other patient management decisions. Negative results must be combined with clinical observations, patient history, and epidemiological information. The expected result is Negative. Fact Sheet for Patients: SugarRoll.be Fact Sheet for Healthcare Providers: https://www.woods-mathews.com/ This  test is not yet approved or cleared by the [place] and  has been authorized for detection and/or diagnosis of SARS-CoV-2 by FDA under an Emergency Use Authorization (EUA). This EUA will remain  in effect (meaning this test can be used) for the duration of the COVID-19 declaration under Section 56 4(b)(1) of the Act, 21 U.S.C. section 360bbb-3(b)(1), unless the authorization is terminated or revoked sooner. Performed at [facility], [place] [address] [place], [place] [address]          Radiology Studies: CT Head Wo Contrast  Result Date: 03/27/2019 CLINICAL DATA:  Altered mental status, history of colon carcinoma with metastatic disease EXAM: CT HEAD WITHOUT CONTRAST TECHNIQUE:  Contiguous axial images were obtained from the base of the skull through the vertex without intravenous contrast. COMPARISON:  07/25/2018 FINDINGS: Brain: Chronic white matter ischemic changes are noted similar to that seen on the prior exam. Mild atrophic changes are again seen. No acute hemorrhage, acute infarction or space-occupying mass lesion is noted. Vascular: No hyperdense vessel or unexpected calcification. Skull: Normal. Negative for fracture or focal lesion. Sinuses/Orbits: No acute finding. Other: None. IMPRESSION: Chronic white matter ischemic changes and atrophic changes stable from the prior exam. Electronically Signed   By: Inez Catalina M.D.   On: 03/27/2019 17:47   CT Abdomen Pelvis W Contrast  Result Date: 03/27/2019 CLINICAL DATA:  Vomiting. History of colon cancer with liver metastasis. EXAM: CT ABDOMEN AND PELVIS WITH CONTRAST TECHNIQUE: Multidetector CT imaging of the abdomen and pelvis was performed using the standard protocol following bolus administration of intravenous contrast. CONTRAST:  118mL OMNIPAQUE IOHEXOL 300 MG/ML  SOLN COMPARISON:  03/08/2019 FINDINGS: Lower chest: Incompletely imaged central line. Clear lung bases. Normal heart size without pericardial or pleural effusion. Hepatobiliary: Significant progression of hepatic metastasis. An index high right hepatic lobe 6.9 x 5.8 cm lesion on 15/2 is increased from 3.8 x 3.6 cm on the prior exam (when remeasured). Index inferior right hepatic lobe lesion measures 4.3 cm on 32/2 versus 3.0 cm on the prior exam (when remeasured). Normal gallbladder, without biliary ductal dilatation. Pancreas: Normal, without mass or ductal dilatation. Spleen: Normal in size, without focal abnormality. Adrenals/Urinary Tract: Normal adrenal glands. Normal kidneys, without hydronephrosis. Normal urinary bladder. Stomach/Bowel: Normal stomach, without wall thickening. An area of underdistention in the sigmoid on 77/2, without evidence of obstruction.  Colonic stool burden suggests constipation. Normal terminal ileum and appendix. Normal small bowel. Vascular/Lymphatic: Aortic and branch vessel atherosclerosis. Adenopathy including at the level of the celiac measures 1.3 cm on 26/2, new. An adjacent right-sided celiac node measures 1.2 cm on 30/2 versus 1.0 cm on the prior exam (when remeasured). No pelvic sidewall adenopathy. Reproductive: Normal uterus and adnexa. Other: No significant free fluid. No evidence of omental or peritoneal disease. Musculoskeletal: No acute osseous abnormality. IMPRESSION: 1. Significant progression of hepatic metastasis. 2. New and progressive abdominal adenopathy, consistent with nodal metastasis. 3. Possible constipation. No evidence of bowel obstruction or other acute superimposed process. 4. Area of underdistention involving the sigmoid colon may represent the site of treated primary. No well-defined residual mass or obstruction at the site. Electronically Signed   By: Abigail Miyamoto M.D.   On: 03/27/2019 19:59   DG Chest Port 1 View  Result Date: 03/27/2019 CLINICAL DATA:  Altered mental status EXAM: PORTABLE CHEST 1 VIEW COMPARISON:  05/31/2017 FINDINGS: A right-sided chest port is present with distal tip terminating at the level of the superior cavoatrial junction. Heart  size is within normal limits. No focal airspace consolidation, pleural effusion, or pneumothorax. No acute osseous findings. IMPRESSION: No acute cardiopulmonary findings. Electronically Signed   By: Davina Poke D.O.   On: 03/27/2019 17:46        Scheduled Meds: . amLODipine  10 mg Oral Daily  . insulin aspart  0-15 Units Subcutaneous TID WC  . insulin aspart  0-5 Units Subcutaneous QHS  . insulin glargine  10 Units Subcutaneous QHS  . levothyroxine  112 mcg Oral Q0600  . lisinopril  5 mg Oral Daily  . ondansetron (ZOFRAN) IV  4 mg Intravenous Once  . polyethylene glycol  17 g Oral Daily   Continuous Infusions: . cefTRIAXone (ROCEPHIN)   IV Stopped (03/28/19 1434)     LOS: 0 days    Time spent: 35 mins,More than 50% of that time was spent in counseling and/or coordination of care.      Shelly Coss, MD [facility] Pager [phone]  If 7PM-7AM, please contact night-coverage [email or web address] Password [email or web address] 03/28/2019, 2:50 PM

## 2019-03-29 ENCOUNTER — Other Ambulatory Visit: Payer: Self-pay

## 2019-03-29 ENCOUNTER — Encounter: Payer: Self-pay | Admitting: Nurse Practitioner

## 2019-03-29 ENCOUNTER — Encounter: Payer: Self-pay | Admitting: Adult Health

## 2019-03-29 DIAGNOSIS — Z0000 Encounter for general adult medical examination without abnormal findings: Secondary | ICD-10-CM

## 2019-03-29 DIAGNOSIS — N939 Abnormal uterine and vaginal bleeding, unspecified: Secondary | ICD-10-CM

## 2019-03-29 LAB — CBC WITH DIFFERENTIAL/PLATELET
Abs Immature Granulocytes: 0.05 10*3/uL (ref 0.00–0.07)
Basophils Absolute: 0.1 10*3/uL (ref 0.0–0.1)
Basophils Relative: 1 %
Eosinophils Absolute: 0.3 10*3/uL (ref 0.0–0.5)
Eosinophils Relative: 2 %
HCT: 35.5 % — ABNORMAL LOW (ref 36.0–46.0)
Hemoglobin: 11 g/dL — ABNORMAL LOW (ref 12.0–15.0)
Immature Granulocytes: 1 %
Lymphocytes Relative: 17 %
Lymphs Abs: 1.9 10*3/uL (ref 0.7–4.0)
MCH: 28.4 pg (ref 26.0–34.0)
MCHC: 31 g/dL (ref 30.0–36.0)
MCV: 91.7 fL (ref 80.0–100.0)
Monocytes Absolute: 0.6 10*3/uL (ref 0.1–1.0)
Monocytes Relative: 6 %
Neutro Abs: 8.2 10*3/uL — ABNORMAL HIGH (ref 1.7–7.7)
Neutrophils Relative %: 73 %
Platelets: 235 10*3/uL (ref 150–400)
RBC: 3.87 MIL/uL (ref 3.87–5.11)
RDW: 14.3 % (ref 11.5–15.5)
WBC: 11.1 10*3/uL — ABNORMAL HIGH (ref 4.0–10.5)
nRBC: 0 % (ref 0.0–0.2)

## 2019-03-29 LAB — URINE CULTURE: Culture: NO GROWTH

## 2019-03-29 LAB — BASIC METABOLIC PANEL
Anion gap: 12 (ref 5–15)
BUN: 11 mg/dL (ref 6–20)
CO2: 24 mmol/L (ref 22–32)
Calcium: 8.1 mg/dL — ABNORMAL LOW (ref 8.9–10.3)
Chloride: 103 mmol/L (ref 98–111)
Creatinine, Ser: 0.53 mg/dL (ref 0.44–1.00)
GFR calc Af Amer: >60 mL/min (ref >60)
GFR calc non Af Amer: >60 mL/min (ref >60)
Glucose, Bld: 190 mg/dL — ABNORMAL HIGH (ref 70–99)
Potassium: 3.7 mmol/L (ref 3.5–5.1)
Sodium: 139 mmol/L (ref 135–145)

## 2019-03-29 LAB — GLUCOSE, CAPILLARY
Glucose-Capillary: 127 mg/dL — ABNORMAL HIGH (ref 70–99)
Glucose-Capillary: 217 mg/dL — ABNORMAL HIGH (ref 70–99)

## 2019-03-29 MED ORDER — CARBAMIDE PEROXIDE 6.5 % OT SOLN
5.0000 [drp] | Freq: Two times a day (BID) | OTIC | Status: DC
  Filled 2019-03-29: qty 15

## 2019-03-29 MED ORDER — CARBAMIDE PEROXIDE 6.5 % OT SOLN: 5.0000 [drp] | Freq: Two times a day (BID) | OTIC | 0 refills | Status: AC

## 2019-03-29 MED ORDER — HEPARIN SOD (PORK) LOCK FLUSH 100 UNIT/ML IV SOLN
500.0000 [IU] | INTRAVENOUS | Status: AC | PRN
  Administered 2019-03-29: 500 [IU]
  Filled 2019-03-29: qty 5

## 2019-03-29 MED ORDER — CEFDINIR 300 MG PO CAPS: 300.0000 mg | ORAL_CAPSULE | Freq: Two times a day (BID) | ORAL | 0 refills | Status: AC

## 2019-03-29 NOTE — Discharge Summary (Addendum)
Physician Discharge Summary  Beverly Schwartz [id number] DOB: 11-20-1970 DOA: 03/27/2019  PCP: Esaw Grandchild, NP  Admit date: 03/27/2019 Discharge date: 03/29/2019  Admitted From: Home Disposition:  Home  Discharge Condition:Stable CODE STATUS:FULL Diet recommendation: Heart Healthy  Brief/Interim Summary:  Patient is a 48 year old female with history of diabetes type 2, metastatic colon cancer with liver mets who presented with nausea, vomiting, abdominal pain, confusion from home.  She lives with her mother.  On presentation she was found to be afebrile.  Chest x-ray did not show pneumonia.  Elevated white cell counts on presentation.  CT abdomen/pelvis showed worsening metastatic disease in the liver, constipation.  She was started on IV antibiotics, IV fluids.  Her overall status significantly improved.  Her leukocytosis has almost resolved.  UA was not that impressive for UTI.  Urine culture pending. Oncology was also following here.  She is hemodynamically stable for discharge home today with oral antibiotics.  Following problems were addressed during her hospitalization:  Altered mental status: Currently alert and oriented.    Confusion could be associated with UTI.  Continue to monitor.  Mental status has improved.  Suspected sepsis/suspected urinary tract infection: Presented with leukocytosis.  Started on ceftriaxone.  Started on Gentle IV fluids.  UA was not that impressive for UTI but is improved with IV fluids and antibiotics.  We will continue oral antibiotics for few more days at home.  Diabetes type 2: Continue home regimen  Hypertension: Currently blood pressure stable.  Continue home medicines  Abdominal pain: Much better now.  CT also showed constipation.  Continue bowel regimen.  Metastatic colon cancer: CT abdomen/pelvis showed worsening intra-abdominal mets with liver masses, lymph nodes.  No definitive colon mass.  Oncology consulted.  Follows with Dr. Burr Medico.   Currently not on active chemotherapy.  Vaginal bleeding: Patient states she has irregular vaginal bleed.  CT abdomen/pelvis did not show any intrauterine abnormalities.  Likely due to DUB.  Follow-up with GYN as an outpatient.  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 2)Follow up with your oncologist as an outpatient. 3)Follow up with gynecology as an outpatient.   Increase activity slowly   Complete by: As directed      Allergies as of 03/29/2019      Reactions   Bupropion Other (See Comments)   suicidal ideations   Amoxicillin Rash   Has patient had a PCN reaction causing immediate rash, facial/tongue/throat swelling, SOB or lightheadedness with hypotension: Yes Has patient had a PCN reaction causing severe rash involving mucus membranes or skin necrosis: No Has patient had a PCN reaction that required hospitalization: No Has patient had a PCN reaction occurring within the last 10 years: No If all of the above answers are "NO", then may proceed with Cephalosporin use.      Medication List    TAKE these medications   acetaminophen-codeine 300-30 MG tablet Commonly known as: TYLENOL #3 Take 1 tablet by mouth every 6 (six) hours as needed for moderate pain.   albuterol 108 (90 Base) MCG/ACT  inhaler Commonly known as: VENTOLIN HFA Inhale 2 puffs into the lungs every 6 (six) hours as needed for wheezing or shortness of breath.   ALPRAZolam 1 MG tablet Commonly known as: XANAX TAKE 1 TABLET BY MOUTH AT BEDTIME AS NEEDED FOR ANXIETY What  changed:   how much to take  how to take this  when to take this  reasons to take this  additional instructions   amLODipine 10 MG tablet Commonly known as: NORVASC TAKE 1 TABLET BY MOUTH EVERY DAY   benzonatate 100 MG capsule Commonly known as: TESSALON Take 1 capsule (100 mg total) by mouth 3 (three) times daily as needed for cough.   carbamide peroxide 6.5 % OTIC solution Commonly known as: DEBROX Place 5 drops into both ears 2 (two) times daily.   cefdinir 300 MG capsule Commonly known as: OMNICEF Take 1 capsule (300 mg total) by mouth 2 (two) times daily for 3 days.   CINNAMON PO Take 1 tablet by mouth daily.   Fish Oil 1000 MG Caps Take 1,000 mg by mouth daily.   fluticasone 50 MCG/ACT nasal spray Commonly known as: FLONASE Place 1 spray into both nostrils daily. What changed:   when to take this  reasons to take this   Insulin Syringe-Needle U-100 31G X 5/16" 1 ML Misc Commonly known as: BD Insulin Syringe U/F Inject twice a day as directed   levothyroxine 112 MCG tablet Commonly known as: SYNTHROID Take 1 tablet (112 mcg total) by mouth daily before breakfast.   lisinopril 5 MG tablet Commonly known as: ZESTRIL TAKE 1 TABLET BY MOUTH EVERY DAY   NovoLIN 70/30 (70-30) 100 UNIT/ML injection Generic drug: insulin NPH-regular Human INJECT 10 UNITS WITH BREAKFAST, 10 UNITS WITH DINNER What changed:   how much to take  how to take this  when to take this  additional instructions   polyethylene glycol 17 g packet Commonly known as: MIRALAX / GLYCOLAX Take 17 g by mouth daily.   TURMERIC PO Take 1 capsule by mouth daily.      Follow-up Information    Danford, Berna Spare, NP. Schedule an appointment as soon as possible for a visit in 1 week(s).   Specialty: Family Medicine Contact information: [address] [place] [address] [phone]          Allergies  Allergen Reactions  . Bupropion Other (See Comments)    suicidal  ideations  . Amoxicillin Rash    Has patient had a PCN reaction causing immediate rash, facial/tongue/throat swelling, SOB or lightheadedness with hypotension: Yes Has patient had a PCN reaction causing severe rash involving mucus membranes or skin necrosis: No Has patient had a PCN reaction that required hospitalization: No Has patient had a PCN reaction occurring within the last 10 years: No If all of the above answers are "NO", then may proceed with Cephalosporin use.     Consultations:  Oncology   Procedures/Studies: CT Head Wo Contrast  Result Date: 03/27/2019 CLINICAL DATA:  Altered mental status, history of colon carcinoma with metastatic disease EXAM: CT HEAD WITHOUT CONTRAST TECHNIQUE: Contiguous axial images were obtained from the base of the skull through the vertex without intravenous contrast. COMPARISON:  07/25/2018 FINDINGS: Brain: Chronic white matter ischemic changes are noted similar to that seen on the prior exam. Mild atrophic changes are again seen. No acute hemorrhage, acute infarction or space-occupying mass lesion is noted. Vascular: No hyperdense vessel or unexpected calcification. Skull: Normal. Negative for fracture or focal lesion.  Sinuses/Orbits: No acute finding. Other: None. IMPRESSION: Chronic white matter ischemic changes and atrophic changes stable from the prior exam. Electronically Signed   By: Inez Catalina M.D.   On: 03/27/2019 17:47   CT Chest W Contrast  Result Date: 03/08/2019 CLINICAL DATA:  Gastrointestinal cancer. Assess treatment response. Just tumor with Mets to colon, liver and gallbladder. EXAM: CT CHEST, ABDOMEN, AND PELVIS WITH CONTRAST TECHNIQUE: Multidetector CT imaging of the chest, abdomen and pelvis was performed following the standard protocol during bolus administration of intravenous contrast. CONTRAST:  183mL OMNIPAQUE IOHEXOL 300 MG/ML  SOLN COMPARISON:  12/20/2018 FINDINGS: CT CHEST FINDINGS Cardiovascular: The heart size appears  within normal limits. No pericardial effusion. Aortic atherosclerosis. Mediastinum/Nodes: Normal appearance of the thyroid gland. The trachea appears patent and is midline. Normal appearance of the esophagus. No mediastinal or hilar adenopathy. Lungs/Pleura: No pleural effusion. No suspicious pulmonary nodule or mass. Musculoskeletal: No chest wall mass or suspicious bone lesions identified. CT ABDOMEN PELVIS FINDINGS Hepatobiliary: Multiple liver metastases are identified. Index lesion within segment 8 measures 6.7 x 5.4 cm, image 51/2. Previously this measured 4.4 x 4.0 cm. The index lesion within segment 5 measures 3.2 x 2.0 cm, image 62/2. Previously 2.3 x 1.5 cm. The index lesion within segment 7 measures 4.1 x 2.2 cm, image 66/2. Previously 3.9 x 2.2 cm. Index lesion along the dome measures 1.4 cm, image 46/2. Previously 1.0 cm. Gallbladder is unremarkable. Pancreas: Unremarkable. No pancreatic ductal dilatation or surrounding inflammatory changes. Spleen: Normal in size without focal abnormality. Adrenals/Urinary Tract: Normal adrenal glands. The kidneys are unremarkable. No mass or hydronephrosis. Urinary bladder appears normal. Stomach/Bowel: Stomach is within normal limits. Appendix appears normal. No evidence of bowel wall thickening, distention, or inflammatory changes. Vascular/Lymphatic: Hila aortic atherosclerosis. No aneurysm. Index lymph node adjacent to the celiac trunk and posterior to the portal vein measures 1 cm, image 61/2. Unchanged. Reproductive: Uterus and bilateral adnexa are unremarkable. Other: No free fluid or fluid collections. No peritoneal nodule or mass. Musculoskeletal: No acute or significant osseous findings. IMPRESSION: 1. Interval progression of liver metastasis. 2. Stable borderline enlarged upper abdominal lymph node. 3. Aortic atherosclerosis. Aortic Atherosclerosis (ICD10-I70.0). Electronically Signed   By: Kerby Moors M.D.   On: 03/08/2019 11:03   CT Abdomen Pelvis W  Contrast  Result Date: 03/27/2019 CLINICAL DATA:  Vomiting. History of colon cancer with liver metastasis. EXAM: CT ABDOMEN AND PELVIS WITH CONTRAST TECHNIQUE: Multidetector CT imaging of the abdomen and pelvis was performed using the standard protocol following bolus administration of intravenous contrast. CONTRAST:  177mL OMNIPAQUE IOHEXOL 300 MG/ML  SOLN COMPARISON:  03/08/2019 FINDINGS: Lower chest: Incompletely imaged central line. Clear lung bases. Normal heart size without pericardial or pleural effusion. Hepatobiliary: Significant progression of hepatic metastasis. An index high right hepatic lobe 6.9 x 5.8 cm lesion on 15/2 is increased from 3.8 x 3.6 cm on the prior exam (when remeasured). Index inferior right hepatic lobe lesion measures 4.3 cm on 32/2 versus 3.0 cm on the prior exam (when remeasured). Normal gallbladder, without biliary ductal dilatation. Pancreas: Normal, without mass or ductal dilatation. Spleen: Normal in size, without focal abnormality. Adrenals/Urinary Tract: Normal adrenal glands. Normal kidneys, without hydronephrosis. Normal urinary bladder. Stomach/Bowel: Normal stomach, without wall thickening. An area of underdistention in the sigmoid on 77/2, without evidence of obstruction. Colonic stool burden suggests constipation. Normal terminal ileum and appendix. Normal small bowel. Vascular/Lymphatic: Aortic and branch vessel atherosclerosis. Adenopathy including at the level of the celiac measures 1.3 cm  on 26/2, new. An adjacent right-sided celiac node measures 1.2 cm on 30/2 versus 1.0 cm on the prior exam (when remeasured). No pelvic sidewall adenopathy. Reproductive: Normal uterus and adnexa. Other: No significant free fluid. No evidence of omental or peritoneal disease. Musculoskeletal: No acute osseous abnormality. IMPRESSION: 1. Significant progression of hepatic metastasis. 2. New and progressive abdominal adenopathy, consistent with nodal metastasis. 3. Possible  constipation. No evidence of bowel obstruction or other acute superimposed process. 4. Area of underdistention involving the sigmoid colon may represent the site of treated primary. No well-defined residual mass or obstruction at the site. Electronically Signed   By: Abigail Miyamoto M.D.   On: 03/27/2019 19:59   CT Abdomen Pelvis W Contrast  Result Date: 03/08/2019 CLINICAL DATA:  Gastrointestinal cancer. Assess treatment response. Just tumor with Mets to colon, liver and gallbladder. EXAM: CT CHEST, ABDOMEN, AND PELVIS WITH CONTRAST TECHNIQUE: Multidetector CT imaging of the chest, abdomen and pelvis was performed following the standard protocol during bolus administration of intravenous contrast. CONTRAST:  165mL OMNIPAQUE IOHEXOL 300 MG/ML  SOLN COMPARISON:  12/20/2018 FINDINGS: CT CHEST FINDINGS Cardiovascular: The heart size appears within normal limits. No pericardial effusion. Aortic atherosclerosis. Mediastinum/Nodes: Normal appearance of the thyroid gland. The trachea appears patent and is midline. Normal appearance of the esophagus. No mediastinal or hilar adenopathy. Lungs/Pleura: No pleural effusion. No suspicious pulmonary nodule or mass. Musculoskeletal: No chest wall mass or suspicious bone lesions identified. CT ABDOMEN PELVIS FINDINGS Hepatobiliary: Multiple liver metastases are identified. Index lesion within segment 8 measures 6.7 x 5.4 cm, image 51/2. Previously this measured 4.4 x 4.0 cm. The index lesion within segment 5 measures 3.2 x 2.0 cm, image 62/2. Previously 2.3 x 1.5 cm. The index lesion within segment 7 measures 4.1 x 2.2 cm, image 66/2. Previously 3.9 x 2.2 cm. Index lesion along the dome measures 1.4 cm, image 46/2. Previously 1.0 cm. Gallbladder is unremarkable. Pancreas: Unremarkable. No pancreatic ductal dilatation or surrounding inflammatory changes. Spleen: Normal in size without focal abnormality. Adrenals/Urinary Tract: Normal adrenal glands. The kidneys are unremarkable.  No mass or hydronephrosis. Urinary bladder appears normal. Stomach/Bowel: Stomach is within normal limits. Appendix appears normal. No evidence of bowel wall thickening, distention, or inflammatory changes. Vascular/Lymphatic: Hila aortic atherosclerosis. No aneurysm. Index lymph node adjacent to the celiac trunk and posterior to the portal vein measures 1 cm, image 61/2. Unchanged. Reproductive: Uterus and bilateral adnexa are unremarkable. Other: No free fluid or fluid collections. No peritoneal nodule or mass. Musculoskeletal: No acute or significant osseous findings. IMPRESSION: 1. Interval progression of liver metastasis. 2. Stable borderline enlarged upper abdominal lymph node. 3. Aortic atherosclerosis. Aortic Atherosclerosis (ICD10-I70.0). Electronically Signed   By: Kerby Moors M.D.   On: 03/08/2019 11:03   DG Chest Port 1 View  Result Date: 03/27/2019 CLINICAL DATA:  Altered mental status EXAM: PORTABLE CHEST 1 VIEW COMPARISON:  05/31/2017 FINDINGS: A right-sided chest port is present with distal tip terminating at the level of the superior cavoatrial junction. Heart size is within normal limits. No focal airspace consolidation, pleural effusion, or pneumothorax. No acute osseous findings. IMPRESSION: No acute cardiopulmonary findings. Electronically Signed   By: Davina Poke D.O.   On: 03/27/2019 17:46      Subjective: Patient seen and examined at the bedside this morning. Hemodynamically  stable for discharge today.  Discharge Exam: Vitals:   03/29/19 0711 03/29/19 0840  BP: 118/85 (!) 138/91  Pulse: 98   Resp: 14   Temp: 97.8 F (36.6  C)   SpO2: 98%    Vitals:   03/28/19 2121 03/29/19 0650 03/29/19 0711 03/29/19 0840  BP: (!) 145/98  118/85 (!) 138/91  Pulse: (!) 107  98   Resp: 14  14   Temp: 98.4 F (36.9 C)  97.8 F (36.6 C)   TempSrc: Oral  Oral   SpO2: 96%  98%   Weight:  68.8 kg    Height:  5\' 5"  (1.651 m)      General: Pt is alert, awake, not in acute  distress Cardiovascular: RRR, S1/S2 +, no rubs, no gallops Respiratory: CTA bilaterally, no wheezing, no rhonchi Abdominal: Soft, NT, ND, bowel sounds + Extremities: no edema, no cyanosis    The results of significant diagnostics from this hospitalization (including imaging, microbiology, ancillary and laboratory) are listed below for reference.     Microbiology: Recent Results (from the past 240 hour(s))  SARS CORONAVIRUS 2 (TAT 6-24 HRS) Nasopharyngeal Nasopharyngeal Swab     Status: None   Collection Time: 03/27/19 11:21 PM   Specimen: Nasopharyngeal Swab  Result Value Ref Range Status   SARS Coronavirus 2 NEGATIVE NEGATIVE Final    Comment: (NOTE) SARS-CoV-2 target nucleic acids are NOT DETECTED. The SARS-CoV-2 RNA is generally detectable in upper and lower respiratory specimens during the acute phase of infection. Negative results do not preclude SARS-CoV-2 infection, do not rule out co-infections with other pathogens, and should not be used as the sole basis for treatment or other patient management decisions. Negative results must be combined with clinical observations, patient history, and epidemiological information. The expected result is Negative. Fact Sheet for Patients: SugarRoll.be Fact Sheet for Healthcare Providers: https://www.woods-mathews.com/ This test is not yet approved or cleared by the [place] FDA and  has been authorized for detection and/or diagnosis of SARS-CoV-2 by FDA under an Emergency Use Authorization (EUA). This EUA will remain  in effect (meaning this test can be used) for the duration of the COVID-19 declaration under Section 56 4(b)(1) of the Act, 21 U.S.C. section 360bbb-3(b)(1), unless the authorization is terminated or revoked sooner. Performed at [facility], [place] [address], [place], [place] [address]      Labs: BNP (last 3 results) No results for input(s): BNP in the last 8760  hours. Basic Metabolic Panel: Recent Labs  Lab 03/27/19 1709 03/28/19 0634 03/29/19 0233  NA 138 141 139  K 3.9 3.7 3.7  CL 103 105 103  CO2 22 25 24   GLUCOSE 336* 140* 190*  BUN 10 9 11   CREATININE 0.62 0.50 0.53  CALCIUM 8.6* 8.0* 8.1*   Liver Function Tests: Recent Labs  Lab 03/27/19 1709 03/28/19 0634  AST 30 25  ALT 21 15  ALKPHOS 271* 227*  BILITOT 0.6 0.7  PROT 6.9 6.3*  ALBUMIN 3.1* 2.8*   No results for input(s): LIPASE, AMYLASE in the last 168 hours. Recent Labs  Lab 03/27/19 1710  AMMONIA 11   CBC: Recent Labs  Lab 03/27/19 1709 03/28/19 0634 03/29/19 0233  WBC 20.8* 12.9* 11.1*  NEUTROABS 17.8*  --  8.2*  HGB 10.8* 10.4* 11.0*  HCT 34.8* 33.8* 35.5*  MCV 92.6 92.6 91.7  PLT 313 222 235   Cardiac Enzymes: No results for input(s): CKTOTAL, CKMB, CKMBINDEX, TROPONINI in the last 168 hours. BNP: Invalid input(s): POCBNP CBG: Recent Labs  Lab 03/28/19 0919 03/28/19 1224 03/28/19 1619 03/28/19 2327 03/29/19 0740  GLUCAP 132* 180* 142* 173* 127*   D-Dimer No results for input(s): DDIMER in  the last 72 hours. Hgb A1c No results for input(s): HGBA1C in the last 72 hours. Lipid Profile No results for input(s): CHOL, HDL, LDLCALC, TRIG, CHOLHDL, LDLDIRECT in the last 72 hours. Thyroid function studies No results for input(s): TSH, T4TOTAL, T3FREE, THYROIDAB in the last 72 hours.  Invalid input(s): FREET3 Anemia work up No results for input(s): VITAMINB12, FOLATE, FERRITIN, TIBC, IRON, RETICCTPCT in the last 72 hours. Urinalysis    Component Value Date/Time   COLORURINE YELLOW 03/28/2019 1519   APPEARANCEUR CLEAR 03/28/2019 1519   LABSPEC 1.009 03/28/2019 1519   PHURINE 9.0 (H) 03/28/2019 1519   GLUCOSEU NEGATIVE 03/28/2019 1519   HGBUR LARGE (A) 03/28/2019 1519   BILIRUBINUR NEGATIVE 03/28/2019 1519   KETONESUR NEGATIVE 03/28/2019 1519   PROTEINUR NEGATIVE 03/28/2019 1519   NITRITE NEGATIVE 03/28/2019 1519   LEUKOCYTESUR NEGATIVE  03/28/2019 1519   Sepsis Labs Invalid input(s): PROCALCITONIN,  WBC,  LACTICIDVEN Microbiology Recent Results (from the past 240 hour(s))  SARS CORONAVIRUS 2 (TAT 6-24 HRS) Nasopharyngeal Nasopharyngeal Swab     Status: None   Collection Time: 03/27/19 11:21 PM   Specimen: Nasopharyngeal Swab  Result Value Ref Range Status   SARS Coronavirus 2 NEGATIVE NEGATIVE Final    Comment: (NOTE) SARS-CoV-2 target nucleic acids are NOT DETECTED. The SARS-CoV-2 RNA is generally detectable in upper and lower respiratory specimens during the acute phase of infection. Negative results do not preclude SARS-CoV-2 infection, do not rule out co-infections with other pathogens, and should not be used as the sole basis for treatment or other patient management decisions. Negative results must be combined with clinical observations, patient history, and epidemiological information. The expected result is Negative. Fact Sheet for Patients: SugarRoll.be Fact Sheet for Healthcare Providers: https://www.woods-mathews.com/ This test is not yet approved or cleared by the [place] FDA and  has been authorized for detection and/or diagnosis of SARS-CoV-2 by FDA under an Emergency Use Authorization (EUA). This EUA will remain  in effect (meaning this test can be used) for the duration of the COVID-19 declaration under Section 56 4(b)(1) of the Act, 21 U.S.C. section 360bbb-3(b)(1), unless the authorization is terminated or revoked sooner. Performed at [facility], [place] [address], [place], [place] [address]     Please note: You were cared for by a hospitalist during your hospital stay. Once you are discharged, your primary care physician will handle any further medical issues. Please note that NO REFILLS for any discharge medications will be authorized once you are discharged, as it is imperative that you return to your primary care physician (or establish a  relationship with a primary care physician if you do not have one) for your post hospital discharge needs so that they can reassess your need for medications and monitor your lab values.    Time coordinating discharge: 40 minutes  SIGNED:   Shelly Coss, MD  [facility] 03/29/2019, 11:52 AM Pager [phone]  If 7PM-7AM, please contact night-coverage [email or web address] Password [email or web address]

## 2019-03-29 NOTE — Progress Notes (Signed)
Faxed supporting clinical documents and letter of medical necessity to appeals fax number [phone]. Will f/u with Paulina ~04/01/19 who can be reached at [phone] ext [phone].

## 2019-03-30 ENCOUNTER — Inpatient Hospital Stay: Payer: BC Managed Care – PPO

## 2019-03-30 ENCOUNTER — Encounter: Payer: Self-pay | Admitting: Nurse Practitioner

## 2019-03-30 ENCOUNTER — Other Ambulatory Visit: Payer: Self-pay

## 2019-03-30 ENCOUNTER — Inpatient Hospital Stay: Payer: BC Managed Care – PPO | Attending: Hematology | Admitting: Nurse Practitioner

## 2019-03-30 VITALS — BP 117/87 | HR 110 | Temp 98.5°F | Resp 17 | Ht 65.0 in | Wt 151.2 lb

## 2019-03-30 DIAGNOSIS — Z5111 Encounter for antineoplastic chemotherapy: Secondary | ICD-10-CM | POA: Diagnosis present

## 2019-03-30 DIAGNOSIS — Z5112 Encounter for antineoplastic immunotherapy: Secondary | ICD-10-CM | POA: Diagnosis present

## 2019-03-30 DIAGNOSIS — C787 Secondary malignant neoplasm of liver and intrahepatic bile duct: Secondary | ICD-10-CM | POA: Diagnosis not present

## 2019-03-30 DIAGNOSIS — Z79899 Other long term (current) drug therapy: Secondary | ICD-10-CM | POA: Diagnosis not present

## 2019-03-30 DIAGNOSIS — Z95828 Presence of other vascular implants and grafts: Secondary | ICD-10-CM

## 2019-03-30 DIAGNOSIS — C19 Malignant neoplasm of rectosigmoid junction: Secondary | ICD-10-CM

## 2019-03-30 DIAGNOSIS — D500 Iron deficiency anemia secondary to blood loss (chronic): Secondary | ICD-10-CM

## 2019-03-30 DIAGNOSIS — I700 Atherosclerosis of aorta: Secondary | ICD-10-CM | POA: Insufficient documentation

## 2019-03-30 DIAGNOSIS — C187 Malignant neoplasm of sigmoid colon: Secondary | ICD-10-CM

## 2019-03-30 LAB — IRON AND TIBC
Iron: 28 ug/dL — ABNORMAL LOW (ref 41–142)
Saturation Ratios: 10 % — ABNORMAL LOW (ref 21–57)
TIBC: 271 ug/dL (ref 236–444)
UIBC: 243 ug/dL (ref 120–384)

## 2019-03-30 LAB — CBC WITH DIFFERENTIAL (CANCER CENTER ONLY)
Abs Immature Granulocytes: 0.07 10*3/uL (ref 0.00–0.07)
Basophils Absolute: 0.1 10*3/uL (ref 0.0–0.1)
Basophils Relative: 1 %
Eosinophils Absolute: 0.3 10*3/uL (ref 0.0–0.5)
Eosinophils Relative: 2 %
HCT: 35.6 % — ABNORMAL LOW (ref 36.0–46.0)
Hemoglobin: 11.3 g/dL — ABNORMAL LOW (ref 12.0–15.0)
Immature Granulocytes: 1 %
Lymphocytes Relative: 16 %
Lymphs Abs: 2 10*3/uL (ref 0.7–4.0)
MCH: 28.3 pg (ref 26.0–34.0)
MCHC: 31.7 g/dL (ref 30.0–36.0)
MCV: 89.2 fL (ref 80.0–100.0)
Monocytes Absolute: 0.7 10*3/uL (ref 0.1–1.0)
Monocytes Relative: 5 %
Neutro Abs: 9.6 10*3/uL — ABNORMAL HIGH (ref 1.7–7.7)
Neutrophils Relative %: 75 %
Platelet Count: 290 10*3/uL (ref 150–400)
RBC: 3.99 MIL/uL (ref 3.87–5.11)
RDW: 14.2 % (ref 11.5–15.5)
WBC Count: 12.7 10*3/uL — ABNORMAL HIGH (ref 4.0–10.5)
nRBC: 0 % (ref 0.0–0.2)

## 2019-03-30 LAB — CMP (CANCER CENTER ONLY)
ALT: 20 U/L (ref 0–44)
AST: 45 U/L — ABNORMAL HIGH (ref 15–41)
Albumin: 3.2 g/dL — ABNORMAL LOW (ref 3.5–5.0)
Alkaline Phosphatase: 337 U/L — ABNORMAL HIGH (ref 38–126)
Anion gap: 12 (ref 5–15)
BUN: 11 mg/dL (ref 6–20)
CO2: 25 mmol/L (ref 22–32)
Calcium: 8 mg/dL — ABNORMAL LOW (ref 8.9–10.3)
Chloride: 102 mmol/L (ref 98–111)
Creatinine: 0.81 mg/dL (ref 0.44–1.00)
GFR, Est AFR Am: >60 mL/min (ref >60)
GFR, Estimated: >60 mL/min (ref >60)
Glucose, Bld: 250 mg/dL — ABNORMAL HIGH (ref 70–99)
Potassium: 3.9 mmol/L (ref 3.5–5.1)
Sodium: 139 mmol/L (ref 135–145)
Total Bilirubin: 0.4 mg/dL (ref 0.3–1.2)
Total Protein: 7.4 g/dL (ref 6.5–8.1)

## 2019-03-30 LAB — CEA (IN HOUSE-CHCC): CEA (CHCC-In House): 1277.96 ng/mL — ABNORMAL HIGH (ref 0.00–5.00)

## 2019-03-30 LAB — FERRITIN: Ferritin: 144 ng/mL (ref 11–307)

## 2019-03-30 LAB — MAGNESIUM: Magnesium: 1.9 mg/dL (ref 1.7–2.4)

## 2019-03-30 MED ORDER — SODIUM CHLORIDE 0.9% FLUSH
10.0000 mL | INTRAVENOUS | Status: DC | PRN
  Administered 2019-03-30: 11:00:00 10 mL via INTRAVENOUS
  Filled 2019-03-30: qty 10

## 2019-03-30 MED ORDER — HEPARIN SOD (PORK) LOCK FLUSH 100 UNIT/ML IV SOLN
500.0000 [IU] | Freq: Once | INTRAVENOUS | Status: AC
  Administered 2019-03-30: 13:00:00 500 [IU]
  Filled 2019-03-30: qty 5

## 2019-03-30 MED ORDER — OXYCODONE-ACETAMINOPHEN 5-325 MG PO TABS
ORAL_TABLET | ORAL | Status: AC
  Filled 2019-03-30: qty 1

## 2019-03-30 MED ORDER — OXYCODONE-ACETAMINOPHEN 5-325 MG PO TABS: 1.0000 | ORAL_TABLET | Freq: Once | ORAL | Status: DC

## 2019-03-30 MED ORDER — SODIUM CHLORIDE 0.9% FLUSH
10.0000 mL | Freq: Once | INTRAVENOUS | Status: AC
  Administered 2019-03-30: 13:00:00 10 mL
  Filled 2019-03-30: qty 10

## 2019-03-30 MED ORDER — OXYCODONE-ACETAMINOPHEN 5-325 MG PO TABS
1.0000 | ORAL_TABLET | Freq: Once | ORAL | Status: AC
  Administered 2019-03-30: 1 via ORAL

## 2019-03-30 NOTE — Progress Notes (Addendum)
[place]   [phone] [phone]   Clinic Follow up Note   Patient Care Team: Esaw Grandchild, NP as PCP - General (Family Medicine) Delrae Rend, MD as Consulting Physician (Endocrinology) Truitt Merle, MD as Consulting Physician (Hematology) Alla Feeling, NP as Nurse Practitioner (Nurse Practitioner) Clarene Essex, MD as Consulting Physician (Gastroenterology) 03/30/2019  CHIEF COMPLAINT: F/u metastatic colon cancer   SUMMARY OF ONCOLOGIC HISTORY: Oncology History Overview Note  Cancer Staging Malignant neoplasm of rectosigmoid junction [facility]) Staging form: Colon and Rectum, AJCC 8th Edition - Clinical stage from 06/11/2017: Stage IVA (cTX, cN1b, pM1a) - Signed by Truitt Merle, MD on 06/15/2017     Malignant neoplasm of rectosigmoid junction ([place])  05/30/2017 Imaging   CT AP IMPRESSION: 1. 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 Abdominopelvic adenopathy. Rectosigmoid mass with suggestion of partial obstruction as evidenced by large colonic stool burden more proximally. 2.  Aortic Atherosclerosis (ICD10-I70.0).  This is age advanced.   05/31/2017 Tumor Marker   CEA 353.3 (elevated) AFP 4.5 (normal)   05/31/2017 Initial Biopsy   Diagnosis Colon, biopsy, sigmoid - INVASIVE ADENOCARCINOMA - SEE COMMENT   05/31/2017 Imaging   CT CHEST IMPRESSION: 1. No evidence of metastatic disease in the chest. 2. No acute findings.  Aortic Atherosclerosis (ICD10-I70.0).    05/31/2017 Procedure   Colonoscopy per Dr. Watt Climes Findings: An infiltrative and ulcerated partially obstructing medium-sized mass was found in the recto-sigmoid colon. The mass was circumferential. The mass measured four cm in length. No bleeding was present.   Impression - One small polyp in the rectum. - The examination was otherwise normal. - Malignant partially obstructing tumor in the recto-sigmoid colon.  Biopsied. - One medium polyp in the proximal sigmoid colon. - The examination was otherwise normal. - Internal hemorrhoids.   06/03/2017 Initial Diagnosis   Malignant neoplasm of transverse colon ([place])   06/11/2017 Cancer Staging   Staging form: Colon and Rectum, AJCC 8th Edition - Clinical stage from 06/11/2017: Stage IVA (cTX, cN1b, pM1a) - Signed by Truitt Merle, MD on 06/15/2017   06/11/2017 Pathology Results   Liver biopsy confirmed metastatic colon cancer   07/23/2017 Imaging   CT CAP IMPRESSION: 1. Mild progression of hepatic metastasis. 2. Similar rectosigmoid primary with abdominopelvic nodal metastasis. 3.  No acute process or evidence of metastatic disease in the chest. 4. Aortic Atherosclerosis (ICD10-I70.0). Coronary artery atherosclerosis. This is age advanced. 5. Proximal colonic constipation, again suggesting a component of partial obstruction at the primary site. 6. Mild ascending aortic dilatation at 4.1 cm.   08/25/2017 - 05/10/2018 Chemotherapy   -Xeloda '1500mg'$  BID 1 week on and 1 week off starting 08/25/17. Due to her worsening hand-foot syndrome her dose was reduced to '1500mg'$  in the AM and '1000mg'$  in the PM. Due to disease progression we swithced her to CAPOX  -Vectibix every 2 weeks starting 08/25/17. Due to skin rash will stop starting 05/10/18.     12/14/2017 Imaging   IMPRESSION: 1. Response to therapy. Marked decrease in hepatic metastatic burden. More mild decrease in abdominopelvic adenopathy and definition of rectosigmoid primary. 2.  No acute process or evidence of metastatic disease in the chest. 3.  Aortic Atherosclerosis (ICD10-I70.0).    04/28/2018 Imaging   CT CAP 04/28/18  IMPRESSION: 1. Index liver metastases measured on the previous study show no substantial interval change although new liver lesions on today's exam are concerning for progressive disease. 2. Mild lymphadenopathy in the upper abdomen  is stable. 3. Persistent mild ill-defined wall thickening  in the distal sigmoid colon near the rectosigmoid junction. 4.  Aortic Atherosclerois (ICD10-170.0)    05/17/2018 - 12/02/2018 Chemotherapy   CAPOX every 3 weeks with Xeloda '1500mg'$  BID 2 weeks on/1week off starting 05/17/18 -Add Avastin to first cycle.    09/09/2018 Imaging   CT CAP W Contrast IMPRESSION: 1. Slight interval decrease in size one of the right hepatic lobe lesions. Additional lesions within the liver are similar when compared to recent prior exam. 2. Mild adenopathy within the abdomen is stable. 3. Similar-appearing thick walled distal sigmoid colon.   12/20/2018 Imaging   CT CAP W Contrast  IMPRESSION: 1. Increase in size and number of hepatic metastatic lesions compared to the prior study. 2. Stable adenopathy in the abdomen. 3. Signs of sigmoid wall thickening as before.   01/04/2019 -  Chemotherapy   FOLFIRI and Avastin q2weeks starting 01/04/19. The start of 5FU is being held for now.    03/08/2019 Imaging   CT CAP W Contrast  IMPRESSION: 1. Interval progression of liver metastasis. 2. Stable borderline enlarged upper abdominal lymph node. 3. Aortic atherosclerosis.   Aortic Atherosclerosis (ICD10-I70.0).   03/30/2019 -  Chemotherapy   The patient had palonosetron (ALOXI) injection 0.25 mg, 0.25 mg, Intravenous,  Once, 0 of 4 cycles irinotecan (CAMPTOSAR) 300 mg in sodium chloride 0.9 % 500 mL chemo infusion, 160 mg/m2 = 300 mg (88.9 % of original dose 180 mg/m2), Intravenous,  Once, 0 of 4 cycles Dose modification: 160 mg/m2 (original dose 180 mg/m2, Cycle 1, Reason: Provider Judgment) panitumumab (VECTIBIX) 440 mg in sodium chloride 0.9 % 100 mL chemo infusion, 6 mg/kg = 440 mg, Intravenous,  Once, 0 of 4 cycles fluorouracil (ADRUCIL) chemo injection 750 mg, 400 mg/m2 = 750 mg, Intravenous,  Once, 0 of 3 cycles fluorouracil (ADRUCIL) 4,450 mg in sodium chloride 0.9 % 61 mL chemo infusion, 2,400 mg/m2 = 4,450 mg, Intravenous, 1 Day/Dose, 0 of 3  cycles leucovorin 740 mg in sodium chloride 0.9 % 250 mL infusion, 400 mg/m2 = 740 mg, Intravenous,  Once, 0 of 3 cycles  for chemotherapy treatment.      CURRENT THERAPY: Irinotecan and avastin q2 weeks, given 01/04/19 and 11/6. Changed back to FOLFIRI and panitumumab after disease progression noted on 03/27/19 CT. panitumumab currently denied but pending appeal decision  INTERVAL HISTORY: Ms. Kubicki returns for f/u and treatment. She was recently admitted to the hospital from 1/3 to 1/5 for syncopal episode, confusion, n/v and abdominal pain. CT AP on 03/27/19 showed significant disease progression of liver metastasis and new adenopathy in the abdomen consistent with nodal metastasis. CT head was stable from prior, showing chronic white matter ischemic changes and atrophic changes without acute process. Blood cultures negative. Ammonia was normal. She had abnormal UA, started empirically on antibiotics and given IV hydration. Urine culture ultimately negative. She was discharged on 03/28/18.   Today she presents with her mother. She feels sick and fatigued. Has cramping abdominal pain that comes in waves, same pain as when she is constipated. Had small but incomplete BMs yesterday and today. Did not continue miralax after hospital discharge. She forgets to take pain meds but when she does Tylenol #3 works almost instantly. Pain is 8 or 9/10 today. Not eating much, feels full quickly. Nauseated due to pain and constipation but managed with anti-emetics. She notes continuous heavy vaginal bleeding for a month now with clots. She feels her mental status is "98% back  to normal" since hospitalization. Denies headache, vision change or more confusion. Mild cough is stable, no chest pain, dyspnea, fever, chills. She does not want chemo today. Mother is asking about getting more help at home if she continues to decline.    MEDICAL HISTORY:  Past Medical History:  Diagnosis Date  . Anxiety   . Cancer ([place])     colon, liver  . Chest pain 10/18/2018   Patient c/o chest pain right side yesterday intermitten lasting 3-4 hours.  [name], PA notified  . Colon cancer ([place])   . Complication of anesthesia   . Depression   . Diabetes mellitus   . Dizziness   . Family history of breast cancer   . Family history of stomach cancer   . Hyperlipidemia   . Hypertension   . Hypothyroidism   . Obesity   . PONV (postoperative nausea and vomiting)   . Tachycardia     SURGICAL HISTORY: Past Surgical History:  Procedure Laterality Date  . FLEXIBLE SIGMOIDOSCOPY N/A 05/31/2017   Procedure: FLEXIBLE SIGMOIDOSCOPY;  Surgeon: Clarene Essex, MD;  Location: [facility];  Service: Endoscopy;  Laterality: N/A;  . IR IMAGING GUIDED PORT INSERTION  10/19/2018  . WISDOM TOOTH EXTRACTION     age 73's    I have reviewed the social history and family history with the patient and they are unchanged from previous note.  ALLERGIES:  is allergic to bupropion and amoxicillin.  MEDICATIONS:  Current Outpatient Medications  Medication Sig Dispense Refill  . acetaminophen-codeine (TYLENOL #3) 300-30 MG tablet Take 1 tablet by mouth every 6 (six) hours as needed for moderate pain. 30 tablet 0  . albuterol (PROVENTIL HFA;VENTOLIN HFA) 108 (90 Base) MCG/ACT inhaler Inhale 2 puffs into the lungs every 6 (six) hours as needed for wheezing or shortness of breath. 1 Inhaler 0  . ALPRAZolam (XANAX) 1 MG tablet TAKE 1 TABLET BY MOUTH AT BEDTIME AS NEEDED FOR ANXIETY (Patient taking differently: Take 1 mg by mouth at bedtime as needed for anxiety. ) 30 tablet 0  . amLODipine (NORVASC) 10 MG tablet TAKE 1 TABLET BY MOUTH EVERY DAY (Patient taking differently: Take 10 mg by mouth daily. ) 30 tablet 0  . benzonatate (TESSALON) 100 MG capsule Take 1 capsule (100 mg total) by mouth 3 (three) times daily as needed for cough. 30 capsule 0  . carbamide peroxide (DEBROX) 6.5 % OTIC solution Place 5 drops into both ears 2 (two) times daily. 15  mL 0  . cefdinir (OMNICEF) 300 MG capsule Take 1 capsule (300 mg total) by mouth 2 (two) times daily for 3 days. 6 capsule 0  . CINNAMON PO Take 1 tablet by mouth daily.     . fluticasone (FLONASE) 50 MCG/ACT nasal spray Place 1 spray into both nostrils daily. (Patient taking differently: Place 1 spray into both nostrils daily as needed for allergies. ) 16 g 2  . insulin NPH-regular Human (NOVOLIN 70/30) (70-30) 100 UNIT/ML injection INJECT 10 UNITS WITH BREAKFAST, 10 UNITS WITH DINNER (Patient taking differently: Inject 10 Units into the skin 2 (two) times daily with a meal. ) 10 mL 2  . Insulin Syringe-Needle U-100 (BD INSULIN SYRINGE U/F) 31G X 5/16" 1 ML MISC Inject twice a day as directed 200 each 3  . levothyroxine (SYNTHROID, LEVOTHROID) 112 MCG tablet Take 1 tablet (112 mcg total) by mouth daily before breakfast. 90 tablet 3  . lisinopril (ZESTRIL) 5 MG tablet TAKE 1 TABLET BY MOUTH EVERY DAY 90  tablet 0  . Omega-3 Fatty Acids (FISH OIL) 1000 MG CAPS Take 1,000 mg by mouth daily.     . polyethylene glycol (MIRALAX / GLYCOLAX) packet Take 17 g by mouth daily. 14 each 0  . TURMERIC PO Take 1 capsule by mouth daily.      No current facility-administered medications for this visit.   Facility-Administered Medications Ordered in Other Visits  Medication Dose Route Frequency Provider Last Rate Last Admin  . 0.9 %  sodium chloride infusion   Intravenous Continuous Truitt Merle, MD   Stopped at 11/11/18 1330    PHYSICAL EXAMINATION: ECOG PERFORMANCE STATUS: 2 - Symptomatic, <50% confined to bed  Vitals:   03/30/19 1136 03/30/19 1145  BP: (!) 113/92 117/87  Pulse: (!) 115 (!) 110  Resp: 17   Temp: 98.5 F (36.9 C)   SpO2: 100%    Filed Weights   03/30/19 1136  Weight: 151 lb 3.2 oz (68.6 kg)    GENERAL:alert, no distress and comfortable SKIN: no rash  EYES: sclera clear LYMPH:  no palpable cervical or supraclavicular lymphadenopathy LUNGS: clear with normal breathing  effort HEART: regular rate & rhythm, no lower extremity edema ABDOMEN: abdomen soft, decreased bowel sounds on left, tender in lower middle abdmoen NEURO: alert & oriented x 3 with fluent speech, no focal motor/sensory deficits PAC without erythema   LABORATORY DATA:  I have reviewed the data as listed CBC Latest Ref Rng & Units 03/30/2019 03/29/2019 03/28/2019  WBC 4.0 - 10.5 K/uL 12.7(H) 11.1(H) 12.9(H)  Hemoglobin 12.0 - 15.0 g/dL 11.3(L) 11.0(L) 10.4(L)  Hematocrit 36.0 - 46.0 % 35.6(L) 35.5(L) 33.8(L)  Platelets 150 - 400 K/uL 290 235 222     CMP Latest Ref Rng & Units 03/30/2019 03/29/2019 03/28/2019  Glucose 70 - 99 mg/dL 250(H) 190(H) 140(H)  BUN 6 - 20 mg/dL '11 11 9  '$ Creatinine 0.44 - 1.00 mg/dL 0.81 0.53 0.50  Sodium 135 - 145 mmol/L 139 139 141  Potassium 3.5 - 5.1 mmol/L 3.9 3.7 3.7  Chloride 98 - 111 mmol/L 102 103 105  CO2 22 - 32 mmol/L '25 24 25  '$ Calcium 8.9 - 10.3 mg/dL 8.0(L) 8.1(L) 8.0(L)  Total Protein 6.5 - 8.1 g/dL 7.4 - 6.3(L)  Total Bilirubin 0.3 - 1.2 mg/dL 0.4 - 0.7  Alkaline Phos 38 - 126 U/L 337(H) - 227(H)  AST 15 - 41 U/L 45(H) - 25  ALT 0 - 44 U/L 20 - 15      RADIOGRAPHIC STUDIES: I have personally reviewed the radiological images as listed and agreed with the findings in the report. No results found.   ASSESSMENT & PLAN: TAKASHA VETERE is a 49 y.o. female with   1. Sigmoid adenocarcinoma, with abdominopelvic adenopathy andhepatic metastasis, stage IV, MSI-stable , KRAS/NRAS/BRAF wild type -Diagnosed in 05/2017. Patient declined intensive chemo regiments previously due to fear of side effects.She progressed on first lineXelodaandVectibixafter 8 months of therapy. She progressed on second line CAPOX and avastin, she did not tolerate well due to hand/foot syndrome and neuropathy.  -She had PAC placed on 10/19/18. -She started third line irinotecan and avastin on 10/13, patient declined starting 5FU while she moved [place] new [place]. She received 2 doses chemo before restaging on 03/08/19 showed interval progression of liver mets and stable to borderline abd LNs -She was recently hospitalized for syncope, confusion, n/v/ and pain. CT on 03/27/19 showed progression of liver metastasis and new and progressive adenopathy, CEA is markedly  elevated to 1277 which is consistent with progression.  -Dr. Burr Medico recommended FOLFIRI and to re challenge panitumumab. Goal is palliative   2. DM, HTN, HL -Continue medications (Amlodipine, lisinopril) and f/u with PCP -elevated but stable   3. 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 The goal of therapy is to prolong her life and improve her quality of life. -Dr. Burr Medico discussed DNR/DNI during recent hospitalization, patient wants to be full code   5.Insomnia, anxiety  -She has had trouble sleeping from anxiety and allergies  -She has Xanax which she uses as needed. -She notes being more anxious lately from [place] and concerns of contracting this.  -followed by SW  6. 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 On gabapentin -not discussed today  7. Cough with congestion  -for approx 1 month -covid test was negative    Disposition:  Ms. Ernandez appears stable. She has not fully recovered from hospitalization and remains fatigued with uncontrolled abdominal pain secondary to cancer progression and constipation. We discussed pain management. She will increase use of tylenol #3 as needed for moderate to severe pain. I recommend for her to use miralax 1-2 times daily for constipation. I encouraged her to increase mobility which will help with fatigue and also help move her bowels. She can stop cefdinir, urine culture was  negative.   CBC and CMP are mostly stable. Ferritin remains normal. CEA is markedly elevated to 1277, consistent with disease progression in liver and abdominal nodes from 03/08/19 to 03/27/19.   Ms. Rawlinson is requesting to postpone chemo to recover from hospitalization. She agrees to return on 04/04/19 for treatment. panitumumab approval is pending review of an appeal. Should have an update 04/01/19. We had a frank discussion that if she continues to postpone chemotherapy, she will have further progression and her performance status will continue to decline and we will no longer be able to offer chemo. She tells me she wants to continue treatment just not today. She agrees to palliative care consult for home care, not ready for hospice. Her mother was present today who agrees. She is the primary caregiver.   Return 1/11 for treatment, f/u in 3 weeks with next cycle.   No problem-specific Assessment & Plan notes found for this encounter.   Orders Placed This Encounter  Procedures  . Amb Referral to Palliative Care    Referral Priority:   Routine    Referral Type:   Consultation    Referral Reason:   Symptom Managment    Number of Visits Requested:   1   All questions were answered. The patient knows to call the clinic with any problems, questions or concerns. No barriers to learning was detected. I spent 25 minutes counseling the patient face to face. The total time spent in the appointment was 40 minutes and more than 50% was on counseling and review of test results     Alla Feeling, NP 03/30/19   Addendum  I have seen the patient, examined her. I agree with the assessment and and plan and have edited the notes.   Pt is still recovering from her recent hospitalization. I believe most of her symptoms are related to her recent cancer progression, and I encourage her to restart chemo irinotecan and Avastin today. Potential benefit and side  effects discussed, chemo consent obtained, she  agrees to take care. She however does not want to do chemo today, and request to be rescheduled to next week. I discussed the chemo window, and the possibility that she may not be a candidate for chemo if her PS further declines. She voiced understanding.   Truitt Merle  03/30/2019

## 2019-03-30 NOTE — Patient Instructions (Signed)

## 2019-03-31 ENCOUNTER — Telehealth: Payer: Self-pay | Admitting: Nurse Practitioner

## 2019-03-31 ENCOUNTER — Encounter: Payer: Self-pay | Admitting: General Practice

## 2019-03-31 NOTE — Telephone Encounter (Signed)
Scheduled appt per 1/6 los.  Spoke with pt and she is aware of her appt on 1/11.

## 2019-03-31 NOTE — Progress Notes (Signed)
[place] Spiritual Care Note  LVM of encouragement and will continue to follow for support.   [place], [place], [facility] Pager [phone] Voicemail [phone]

## 2019-04-02 LAB — CULTURE, BLOOD (ROUTINE X 2)
Culture: NO GROWTH
Culture: NO GROWTH
Special Requests: ADEQUATE
Special Requests: ADEQUATE

## 2019-04-04 ENCOUNTER — Encounter: Payer: Self-pay | Admitting: General Practice

## 2019-04-04 ENCOUNTER — Other Ambulatory Visit: Payer: Self-pay

## 2019-04-04 ENCOUNTER — Inpatient Hospital Stay: Payer: BC Managed Care – PPO

## 2019-04-04 ENCOUNTER — Telehealth: Payer: Self-pay

## 2019-04-04 VITALS — BP 119/66 | HR 98 | Temp 98.3°F | Resp 18

## 2019-04-04 DIAGNOSIS — C19 Malignant neoplasm of rectosigmoid junction: Secondary | ICD-10-CM

## 2019-04-04 DIAGNOSIS — Z7189 Other specified counseling: Secondary | ICD-10-CM

## 2019-04-04 MED ORDER — HEPARIN SOD (PORK) LOCK FLUSH 100 UNIT/ML IV SOLN
500.0000 [IU] | Freq: Once | INTRAVENOUS | Status: AC | PRN
  Administered 2019-04-04: 14:00:00 500 [IU]
  Filled 2019-04-04: qty 5

## 2019-04-04 MED ORDER — SODIUM CHLORIDE 0.9 % IV SOLN: 10.0000 mg | Freq: Once | INTRAVENOUS | Status: DC

## 2019-04-04 MED ORDER — PALONOSETRON HCL INJECTION 0.25 MG/5ML
INTRAVENOUS | Status: AC
  Filled 2019-04-04: qty 5

## 2019-04-04 MED ORDER — SODIUM CHLORIDE 0.9 % IV SOLN
160.0000 mg/m2 | Freq: Once | INTRAVENOUS | Status: AC
  Administered 2019-04-04: 300 mg via INTRAVENOUS
  Filled 2019-04-04: qty 15

## 2019-04-04 MED ORDER — ATROPINE SULFATE 1 MG/ML IJ SOLN
0.5000 mg | Freq: Once | INTRAMUSCULAR | Status: AC | PRN
  Administered 2019-04-04: 12:00:00 0.5 mg via INTRAVENOUS

## 2019-04-04 MED ORDER — DEXAMETHASONE SODIUM PHOSPHATE 10 MG/ML IJ SOLN
INTRAMUSCULAR | Status: AC
  Filled 2019-04-04: qty 1

## 2019-04-04 MED ORDER — PALONOSETRON HCL INJECTION 0.25 MG/5ML
0.2500 mg | Freq: Once | INTRAVENOUS | Status: AC
  Administered 2019-04-04: 0.25 mg via INTRAVENOUS

## 2019-04-04 MED ORDER — SODIUM CHLORIDE 0.9 % IV SOLN
Freq: Once | INTRAVENOUS | Status: AC
  Filled 2019-04-04: qty 250

## 2019-04-04 MED ORDER — ATROPINE SULFATE 1 MG/ML IJ SOLN
INTRAMUSCULAR | Status: AC
  Filled 2019-04-04: qty 1

## 2019-04-04 MED ORDER — SODIUM CHLORIDE 0.9% FLUSH
10.0000 mL | INTRAVENOUS | Status: DC | PRN
  Administered 2019-04-04: 10 mL
  Filled 2019-04-04: qty 10

## 2019-04-04 MED ORDER — DEXAMETHASONE SODIUM PHOSPHATE 10 MG/ML IJ SOLN
10.0000 mg | Freq: Once | INTRAMUSCULAR | Status: AC
  Administered 2019-04-04: 10:00:00 10 mg via INTRAVENOUS

## 2019-04-04 MED ORDER — SODIUM CHLORIDE 0.9 % IV SOLN
5.4000 mg/kg | Freq: Once | INTRAVENOUS | Status: AC
  Administered 2019-04-04: 400 mg via INTRAVENOUS
  Filled 2019-04-04: qty 20

## 2019-04-04 NOTE — Patient Instructions (Signed)
Panitumumab Solution for Injection What is this medicine? PANITUMUMAB (pan i TOOM ue mab) is a monoclonal antibody. It is used to treat colorectal cancer. This medicine may be used for other purposes; ask your health care provider or pharmacist if you have questions. COMMON BRAND NAME(S): Vectibix What should I tell my health care provider before I take this medicine? They need to know if you have any of these conditions:  eye disease, vision problems  low levels of calcium, magnesium, or potassium in the blood  lung or breathing disease, like asthma  skin conditions or sensitivity  an unusual or allergic reaction to panitumumab, other medicines, foods, dyes, or preservatives  pregnant or trying to get pregnant  breast-feeding How should I use this medicine? This drug is given as an infusion into a vein. It is administered in a hospital or clinic by a specially trained health care professional. Talk to your pediatrician regarding the use of this medicine in children. Special care may be needed. Overdosage: If you think you have taken too much of this medicine contact a poison control center or emergency room at once. NOTE: This medicine is only for you. Do not share this medicine with others. What if I miss a dose? It is important not to miss your dose. Call your doctor or health care professional if you are unable to keep an appointment. What may interact with this medicine? Do not take this medicine with any of the following medications:  bevacizumab This list may not describe all possible interactions. Give your health care provider a list of all the medicines, herbs, non-prescription drugs, or dietary supplements you use. Also tell them if you smoke, drink alcohol, or use illegal drugs. Some items may interact with your medicine. What should I watch for while using this medicine? Visit your doctor for checks on your progress. This drug may make you feel generally unwell. This is  not uncommon, as chemotherapy can affect healthy cells as well as cancer cells. Report any side effects. Continue your course of treatment even though you feel ill unless your doctor tells you to stop. This medicine can make you more sensitive to the sun. Keep out of the sun while receiving this medicine and for 2 months after the last dose. If you cannot avoid being in the sun, wear protective clothing and use sunscreen. Do not use sun lamps or tanning beds/booths. In some cases, you may be given additional medicines to help with side effects. Follow all directions for their use. Call your doctor or health care professional for advice if you get a fever, chills or sore throat, or other symptoms of a cold or flu. Do not treat yourself. This drug decreases your body's ability to fight infections. Try to avoid being around people who are sick. Avoid taking products that contain aspirin, acetaminophen, ibuprofen, naproxen, or ketoprofen unless instructed by your doctor. These medicines may hide a fever. Do not become pregnant while taking this medicine and for 2 months after the last dose. Women should inform their doctor if they wish to become pregnant or think they might be pregnant. There is a potential for serious side effects to an unborn child. Talk to your health care professional or pharmacist for more information. Do not breast-feed an infant while taking this medicine or for 2 months after the last dose. What side effects may I notice from receiving this medicine? Side effects that you should report to your doctor or health care professional as soon   as possible:  allergic reactions like skin rash, itching or hives, swelling of the face, lips, or tongue  breathing problems  changes in vision  eye pain  fast, irregular heartbeat  fever, chills  mouth sores  red spots on the skin  redness, blistering, peeling or loosening of the skin, including inside the mouth  signs and symptoms of  kidney injury like trouble passing urine or change in the amount of urine  signs and symptoms of low blood pressure like dizziness; feeling faint or lightheaded, falls; unusually weak or tired  signs of low calcium like fast heartbeat, muscle cramps or muscle pain; pain, tingling, numbness in the hands or feet; seizures  signs and symptoms of low magnesium like muscle cramps, pain, or weakness; tremors; seizures; or fast, irregular heartbeat  signs and symptoms of low potassium like muscle cramps or muscle pain; chest pain; dizziness; feeling faint or lightheaded, falls; palpitations; breathing problems; or fast, irregular heartbeat  swelling of the ankles, feet, hands Side effects that usually do not require medical attention (report to your doctor or health care professional if they continue or are bothersome):  changes in skin like acne, cracks, skin dryness  diarrhea  eyelash growth  headache  mouth sores  nail changes  nausea, vomiting This list may not describe all possible side effects. Call your doctor for medical advice about side effects. You may report side effects to FDA at 1-800-FDA-1088. Where should I keep my medicine? This drug is given in a hospital or clinic and will not be stored at home. NOTE: This sheet is a summary. It may not cover all possible information. If you have questions about this medicine, talk to your doctor, pharmacist, or health care provider.  2020 Elsevier/Gold Standard (2015-09-28 16:45:04)  Irinotecan injection What is this medicine? IRINOTECAN (ir in oh TEE kan ) is a chemotherapy drug. It is used to treat colon and rectal cancer. This medicine may be used for other purposes; ask your health care provider or pharmacist if you have questions. COMMON BRAND NAME(S): Camptosar What should I tell my health care provider before I take this medicine? They need to know if you have any of these conditions:  dehydration  diarrhea  infection  (especially a virus infection such as chickenpox, cold sores, or herpes)  liver disease  low blood counts, like low white cell, platelet, or red cell counts  low levels of calcium, magnesium, or potassium in the blood  recent or ongoing radiation therapy  an unusual or allergic reaction to irinotecan, other medicines, foods, dyes, or preservatives  pregnant or trying to get pregnant  breast-feeding How should I use this medicine? This drug is given as an infusion into a vein. It is administered in a hospital or clinic by a specially trained health care professional. Talk to your pediatrician regarding the use of this medicine in children. Special care may be needed. Overdosage: If you think you have taken too much of this medicine contact a poison control center or emergency room at once. NOTE: This medicine is only for you. Do not share this medicine with others. What if I miss a dose? It is important not to miss your dose. Call your doctor or health care professional if you are unable to keep an appointment. What may interact with this medicine? This medicine may interact with the following medications:  antiviral medicines for HIV or AIDS  certain antibiotics like rifampin or rifabutin  certain medicines for fungal infections like itraconazole,  ketoconazole, posaconazole, and voriconazole  certain medicines for seizures like carbamazepine, phenobarbital, phenotoin  clarithromycin  gemfibrozil  nefazodone  St. John's Wort This list may not describe all possible interactions. Give your health care provider a list of all the medicines, herbs, non-prescription drugs, or dietary supplements you use. Also tell them if you smoke, drink alcohol, or use illegal drugs. Some items may interact with your medicine. What should I watch for while using this medicine? Your condition will be monitored carefully while you are receiving this medicine. You will need important blood work done  while you are taking this medicine. This drug may make you feel generally unwell. This is not uncommon, as chemotherapy can affect healthy cells as well as cancer cells. Report any side effects. Continue your course of treatment even though you feel ill unless your doctor tells you to stop. In some cases, you may be given additional medicines to help with side effects. Follow all directions for their use. You may get drowsy or dizzy. Do not drive, use machinery, or do anything that needs mental alertness until you know how this medicine affects you. Do not stand or sit up quickly, especially if you are an older patient. This reduces the risk of dizzy or fainting spells. Call your health care professional for advice if you get a fever, chills, or sore throat, or other symptoms of a cold or flu. Do not treat yourself. This medicine decreases your body's ability to fight infections. Try to avoid being around people who are sick. Avoid taking products that contain aspirin, acetaminophen, ibuprofen, naproxen, or ketoprofen unless instructed by your doctor. These medicines may hide a fever. This medicine may increase your risk to bruise or bleed. Call your doctor or health care professional if you notice any unusual bleeding. Be careful brushing and flossing your teeth or using a toothpick because you may get an infection or bleed more easily. If you have any dental work done, tell your dentist you are receiving this medicine. Do not become pregnant while taking this medicine or for 6 months after stopping it. Women should inform their health care professional if they wish to become pregnant or think they might be pregnant. Men should not father a child while taking this medicine and for 3 months after stopping it. There is potential for serious side effects to an unborn child. Talk to your health care professional for more information. Do not breast-feed an infant while taking this medicine or for 7 days after  stopping it. This medicine has caused ovarian failure in some women. This medicine may make it more difficult to get pregnant. Talk to your health care professional if you are concerned about your fertility. This medicine has caused decreased sperm counts in some men. This may make it more difficult to father a child. Talk to your health care professional if you are concerned about your fertility. What side effects may I notice from receiving this medicine? Side effects that you should report to your doctor or health care professional as soon as possible:  allergic reactions like skin rash, itching or hives, swelling of the face, lips, or tongue  chest pain  diarrhea  flushing, runny nose, sweating during infusion  low blood counts - this medicine may decrease the number of white blood cells, red blood cells and platelets. You may be at increased risk for infections and bleeding.  nausea, vomiting  pain, swelling, warmth in the leg  signs of decreased platelets or bleeding -  bruising, pinpoint red spots on the skin, black, tarry stools, blood in the urine  signs of infection - fever or chills, cough, sore throat, pain or difficulty passing urine  signs of decreased red blood cells - unusually weak or tired, fainting spells, lightheadedness Side effects that usually do not require medical attention (report to your doctor or health care professional if they continue or are bothersome):  constipation  hair loss  headache  loss of appetite  mouth sores  stomach pain This list may not describe all possible side effects. Call your doctor for medical advice about side effects. You may report side effects to FDA at 1-800-FDA-1088. Where should I keep my medicine? This drug is given in a hospital or clinic and will not be stored at home. NOTE: This sheet is a summary. It may not cover all possible information. If you have questions about this medicine, talk to your doctor, pharmacist,  or health care provider.  2020 Elsevier/Gold Standard (2018-04-30 10:09:17)

## 2019-04-04 NOTE — Progress Notes (Signed)
[place] Spiritual Care Note  Followed up with Beverly Schwartz in infusion, making space for her to process her recent admission and update on disease progression. She is delighted by the completion of her tiny house and the manageable independence it affords. Time with her son of course continues to be top priority. We plan for me to phone later in the week to talk in more detail.   [place], [place], [facility] Pager [phone] Voicemail [phone]

## 2019-04-04 NOTE — Telephone Encounter (Signed)
Faxed recent office not and [facility] report to [facility] to fax [phone]

## 2019-04-08 ENCOUNTER — Encounter: Payer: Self-pay | Admitting: General Practice

## 2019-04-08 NOTE — Progress Notes (Signed)
[place] Spiritual Care Note  Attempted follow-up by phone, but Kim's voicemail is full. Will try again next week.   [place], [place], [facility] Pager [phone] Voicemail [phone]

## 2019-04-11 ENCOUNTER — Encounter: Payer: Self-pay | Admitting: General Practice

## 2019-04-11 NOTE — Progress Notes (Signed)
[facility] Spiritual Care Note  Mailed a handwritten note of encouragement as well.   [place], [place], [facility] Pager [phone] Voicemail [phone]

## 2019-04-11 NOTE — Progress Notes (Signed)
[place] Spiritual Care Note  Unable to reach Lewis by phone, LVM encouraging callback.   [place], [place], [facility] Pager [phone] Voicemail [phone]

## 2019-04-12 ENCOUNTER — Encounter: Payer: Self-pay | Admitting: Hematology

## 2019-04-13 ENCOUNTER — Other Ambulatory Visit: Payer: BC Managed Care – PPO

## 2019-04-13 ENCOUNTER — Ambulatory Visit: Payer: BC Managed Care – PPO | Admitting: Hematology

## 2019-04-13 ENCOUNTER — Ambulatory Visit: Payer: BC Managed Care – PPO

## 2019-04-14 ENCOUNTER — Encounter: Payer: Self-pay | Admitting: Hematology

## 2019-04-14 ENCOUNTER — Other Ambulatory Visit: Payer: Self-pay | Admitting: Adult Health

## 2019-04-14 ENCOUNTER — Encounter: Payer: Self-pay | Admitting: Adult Health

## 2019-04-14 DIAGNOSIS — I10 Essential (primary) hypertension: Secondary | ICD-10-CM

## 2019-04-14 DIAGNOSIS — E039 Hypothyroidism, unspecified: Secondary | ICD-10-CM

## 2019-04-14 DIAGNOSIS — E119 Type 2 diabetes mellitus without complications: Secondary | ICD-10-CM

## 2019-04-14 NOTE — Telephone Encounter (Signed)
Pt's last TSH was 04/2018.  Does she need repeat TSH prior to refill medication?  Charyl Bigger, CMA

## 2019-04-14 NOTE — Telephone Encounter (Signed)
Good Afternoon Tonya, Please provide her a 30 day supply and ask her to come in for TSH lab draw. I will put in lab orders. Thanks! Valetta Fuller

## 2019-04-15 ENCOUNTER — Encounter: Payer: Self-pay | Admitting: Hematology

## 2019-04-15 ENCOUNTER — Telehealth: Payer: Self-pay | Admitting: Hematology

## 2019-04-15 NOTE — Telephone Encounter (Signed)
I talk with patient regarding reschedule from 1/25 to 1/26 patient is aware of gap form MD to chemo

## 2019-04-15 NOTE — Progress Notes (Signed)
[place]   [phone] [phone]   Clinic Follow up Note   Patient Care Team: Esaw Grandchild, NP as PCP - General (Family Medicine) Delrae Rend, MD as Consulting Physician (Endocrinology) Truitt Merle, MD as Consulting Physician (Hematology) Alla Feeling, NP as Nurse Practitioner (Nurse Practitioner) Clarene Essex, MD as Consulting Physician (Gastroenterology)  Date of Service:  04/19/2019  CHIEF COMPLAINT: F/u metastatic colon cancer   SUMMARY OF ONCOLOGIC HISTORY: Oncology History Overview Note  Cancer Staging Malignant neoplasm of rectosigmoid junction [facility]) Staging form: Colon and Rectum, AJCC 8th Edition - Clinical stage from 06/11/2017: Stage IVA (cTX, cN1b, pM1a) - Signed by Truitt Merle, MD on 06/15/2017     Malignant neoplasm of rectosigmoid junction ([place])  05/30/2017 Imaging   CT AP IMPRESSION: 1. 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 Abdominopelvic adenopathy. Rectosigmoid mass with suggestion of partial obstruction as evidenced by large colonic stool burden more proximally. 2.  Aortic Atherosclerosis (ICD10-I70.0).  This is age advanced.   05/31/2017 Tumor Marker   CEA 353.3 (elevated) AFP 4.5 (normal)   05/31/2017 Initial Biopsy   Diagnosis Colon, biopsy, sigmoid - INVASIVE ADENOCARCINOMA - SEE COMMENT   05/31/2017 Imaging   CT CHEST IMPRESSION: 1. No evidence of metastatic disease in the chest. 2. No acute findings.  Aortic Atherosclerosis (ICD10-I70.0).    05/31/2017 Procedure   Colonoscopy per Dr. Watt Climes Findings: An infiltrative and ulcerated partially obstructing medium-sized mass was found in the recto-sigmoid colon. The mass was circumferential. The mass measured four cm in length. No bleeding was present.   Impression - One small polyp in the rectum. - The examination was otherwise normal. - Malignant partially obstructing tumor in the  recto-sigmoid colon. Biopsied. - One medium polyp in the proximal sigmoid colon. - The examination was otherwise normal. - Internal hemorrhoids.   06/03/2017 Initial Diagnosis   Malignant neoplasm of transverse colon ([place])   06/11/2017 Cancer Staging   Staging form: Colon and Rectum, AJCC 8th Edition - Clinical stage from 06/11/2017: Stage IVA (cTX, cN1b, pM1a) - Signed by Truitt Merle, MD on 06/15/2017   06/11/2017 Pathology Results   Liver biopsy confirmed metastatic colon cancer   07/23/2017 Imaging   CT CAP IMPRESSION: 1. Mild progression of hepatic metastasis. 2. Similar rectosigmoid primary with abdominopelvic nodal metastasis. 3.  No acute process or evidence of metastatic disease in the chest. 4. Aortic Atherosclerosis (ICD10-I70.0). Coronary artery atherosclerosis. This is age advanced. 5. Proximal colonic constipation, again suggesting a component of partial obstruction at the primary site. 6. Mild ascending aortic dilatation at 4.1 cm.   08/25/2017 - 05/10/2018 Chemotherapy   -Xeloda '1500mg'$  BID 1 week on and 1 week off starting 08/25/17. Due to her worsening hand-foot syndrome her dose was reduced to '1500mg'$  in the AM and '1000mg'$  in the PM. Due to disease progression we swithced her to CAPOX  -Vectibix every 2 weeks starting 08/25/17. Due to skin rash will stop starting 05/10/18.     12/14/2017 Imaging   IMPRESSION: 1. Response to therapy. Marked decrease in hepatic metastatic burden. More mild decrease in abdominopelvic adenopathy and definition of rectosigmoid primary. 2.  No acute process or evidence of metastatic disease in the chest. 3.  Aortic Atherosclerosis (ICD10-I70.0).    04/28/2018 Imaging   CT CAP 04/28/18  IMPRESSION: 1. Index liver metastases measured on the previous study show no substantial interval change although new liver lesions on today's exam are concerning for progressive disease. 2. Mild  lymphadenopathy in the upper abdomen is stable. 3. Persistent mild  ill-defined wall thickening in the distal sigmoid colon near the rectosigmoid junction. 4.  Aortic Atherosclerois (ICD10-170.0)    05/17/2018 - 12/02/2018 Chemotherapy   CAPOX every 3 weeks with Xeloda '1500mg'$  BID 2 weeks on/1week off starting 05/17/18 -Add Avastin to first cycle.    09/09/2018 Imaging   CT CAP W Contrast IMPRESSION: 1. Slight interval decrease in size one of the right hepatic lobe lesions. Additional lesions within the liver are similar when compared to recent prior exam. 2. Mild adenopathy within the abdomen is stable. 3. Similar-appearing thick walled distal sigmoid colon.   12/20/2018 Imaging   CT CAP W Contrast  IMPRESSION: 1. Increase in size and number of hepatic metastatic lesions compared to the prior study. 2. Stable adenopathy in the abdomen. 3. Signs of sigmoid wall thickening as before.   01/04/2019 - 01/28/2019 Chemotherapy   Irinotecan and Avastin q2weeks starting 01/04/19.  Stoppes due to disease progression.    03/08/2019 Imaging   CT CAP W Contrast  IMPRESSION: 1. Interval progression of liver metastasis. 2. Stable borderline enlarged upper abdominal lymph node. 3. Aortic atherosclerosis.   Aortic Atherosclerosis (ICD10-I70.0).   04/04/2019 -  Chemotherapy   FOLFIRI and Vectibix q2weeks starting 04/04/19      CURRENT THERAPY:  FOLFIRI and Vectibix q2weeks starting 04/04/19. C1 without 5FU, C2 with 50% 5FU.   INTERVAL HISTORY:  Beverly Schwartz is here for a follow up and treatment. She presents to the clinic alone. She notes she is doing well. She is still scared about adding 5FU because she is afraid to take it home. She has moved officially next door to her mother. She notes she does feel better compared to last visit. She notes her skin rash from vectibix is on her face and chest now.    REVIEW OF SYSTEMS:   Constitutional: Denies fevers, chills or abnormal weight loss Eyes: Denies blurriness of vision Ears, nose, mouth, throat, and  face: Denies mucositis or sore throat Respiratory: Denies cough, dyspnea or wheezes Cardiovascular: Denies palpitation, chest discomfort or lower extremity swelling Gastrointestinal:  Denies nausea, heartburn or change in bowel habits Skin: (+) Skin acne rash of face and chest  Lymphatics: Denies new lymphadenopathy or easy bruising Neurological:Denies numbness, tingling or new weaknesses Behavioral/Psych: Mood is stable, no new changes  All other systems were reviewed with the patient and are negative.  MEDICAL HISTORY:  Past Medical History:  Diagnosis Date  . Anxiety   . Cancer ([place])    colon, liver  . Chest pain 10/18/2018   Patient c/o chest pain right side yesterday intermitten lasting 3-4 hours.  [name], PA notified  . Colon cancer ([place])   . Complication of anesthesia   . Depression   . Diabetes mellitus   . Dizziness   . Family history of breast cancer   . Family history of stomach cancer   . Hyperlipidemia   . Hypertension   . Hypothyroidism   . Obesity   . PONV (postoperative nausea and vomiting)   . Tachycardia     SURGICAL HISTORY: Past Surgical History:  Procedure Laterality Date  . FLEXIBLE SIGMOIDOSCOPY N/A 05/31/2017   Procedure: FLEXIBLE SIGMOIDOSCOPY;  Surgeon: Clarene Essex, MD;  Location: [facility] ENDOSCOPY;  Service: Endoscopy;  Laterality: N/A;  . IR IMAGING GUIDED PORT INSERTION  10/19/2018  . WISDOM TOOTH EXTRACTION     age 15's    I have reviewed the social history and family  history with the patient and they are unchanged from previous note.  ALLERGIES:  is allergic to bupropion and amoxicillin.  MEDICATIONS:  Current Outpatient Medications  Medication Sig Dispense Refill  . acetaminophen-codeine (TYLENOL #3) 300-30 MG tablet Take 1 tablet by mouth every 6 (six) hours as needed for moderate pain. 30 tablet 0  . albuterol (PROVENTIL HFA;VENTOLIN HFA) 108 (90 Base) MCG/ACT inhaler Inhale 2 puffs into the lungs every 6 (six) hours as needed for  wheezing or shortness of breath. 1 Inhaler 0  . ALPRAZolam (XANAX) 1 MG tablet TAKE 1 TABLET BY MOUTH AT BEDTIME AS NEEDED FOR ANXIETY (Patient taking differently: Take 1 mg by mouth at bedtime as needed for anxiety. ) 30 tablet 0  . amLODipine (NORVASC) 10 MG tablet TAKE 1 TABLET BY MOUTH EVERY DAY (Patient taking differently: Take 10 mg by mouth daily. ) 30 tablet 0  . benzonatate (TESSALON) 100 MG capsule Take 1 capsule (100 mg total) by mouth 3 (three) times daily as needed for cough. 30 capsule 0  . carbamide peroxide (DEBROX) 6.5 % OTIC solution Place 5 drops into both ears 2 (two) times daily. 15 mL 0  . CINNAMON PO Take 1 tablet by mouth daily.     . clindamycin (CLINDAGEL) 1 % gel Apply topically 2 (two) times daily. 30 g 2  . fluticasone (FLONASE) 50 MCG/ACT nasal spray Place 1 spray into both nostrils daily. (Patient taking differently: Place 1 spray into both nostrils daily as needed for allergies. ) 16 g 2  . insulin NPH-regular Human (NOVOLIN 70/30) (70-30) 100 UNIT/ML injection INJECT 10 UNITS WITH BREAKFAST, 10 UNITS WITH DINNER (Patient taking differently: Inject 10 Units into the skin 2 (two) times daily with a meal. ) 10 mL 2  . Insulin Syringe-Needle U-100 (BD INSULIN SYRINGE U/F) 31G X 5/16" 1 ML MISC Inject twice a day as directed 200 each 3  . levothyroxine (SYNTHROID) 112 MCG tablet Take 1 tablet (112 mcg total) by mouth daily before breakfast. LABS REQUIRED PRIOR TO ANY FURTHER REFILLS. 30 tablet 0  . lisinopril (ZESTRIL) 5 MG tablet TAKE 1 TABLET BY MOUTH EVERY DAY 90 tablet 0  . Omega-3 Fatty Acids (FISH OIL) 1000 MG CAPS Take 1,000 mg by mouth daily.     . polyethylene glycol (MIRALAX / GLYCOLAX) packet Take 17 g by mouth daily. 14 each 0  . TURMERIC PO Take 1 capsule by mouth daily.      No current facility-administered medications for this visit.   Facility-Administered Medications Ordered in Other Visits  Medication Dose Route Frequency Provider Last Rate Last Admin   . 0.9 %  sodium chloride infusion   Intravenous Continuous Truitt Merle, MD   Stopped at 11/11/18 1330  . fluorouracil (ADRUCIL) 2,200 mg in sodium chloride 0.9 % 106 mL chemo infusion  1,200 mg/m2 (Treatment Plan Recorded) Intravenous 1 day or 1 dose Truitt Merle, MD   2,200 mg at 04/19/19 1513  . heparin lock flush 100 unit/mL  500 Units Intracatheter Once PRN Truitt Merle, MD      . sodium chloride flush (NS) 0.9 % injection 10 mL  10 mL Intracatheter PRN Truitt Merle, MD        PHYSICAL EXAMINATION: ECOG PERFORMANCE STATUS: 1 - Symptomatic but completely ambulatory  Vitals:   04/19/19 1010  BP: 139/80  Pulse: 87  Resp: 18  Temp: 98.2 F (36.8 C)  SpO2: 100%   Filed Weights   04/19/19 1010  Weight: 153 lb 4.8  oz (69.5 kg)    GENERAL:alert, no distress and comfortable SKIN: skin color, texture, turgor are normal (+0 Skin acne rash of chest (+) Left groin skin infection, boil EYES: normal, Conjunctiva are pink and non-injected, sclera clear  NECK: supple, thyroid normal size, non-tender, without nodularity LYMPH:  no palpable lymphadenopathy in the cervical, axillary  LUNGS: clear to auscultation and percussion with normal breathing effort HEART: regular rate & rhythm and no murmurs and no lower extremity edema ABDOMEN:abdomen soft, non-tender and normal bowel sounds Musculoskeletal:no cyanosis of digits and no clubbing  NEURO: alert & oriented x 3 with fluent speech, no focal motor/sensory deficits  LABORATORY DATA:  I have reviewed the data as listed CBC Latest Ref Rng & Units 04/19/2019 03/30/2019 03/29/2019  WBC 4.0 - 10.5 K/uL 5.1 12.7(H) 11.1(H)  Hemoglobin 12.0 - 15.0 g/dL 11.1(L) 11.3(L) 11.0(L)  Hematocrit 36.0 - 46.0 % 35.5(L) 35.6(L) 35.5(L)  Platelets 150 - 400 K/uL 238 290 235     CMP Latest Ref Rng & Units 04/19/2019 03/30/2019 03/29/2019  Glucose 70 - 99 mg/dL 127(H) 250(H) 190(H)  BUN 6 - 20 mg/dL 5(L) 11 11  Creatinine 0.44 - 1.00 mg/dL 0.62 0.81 0.53  Sodium 135 - 145  mmol/L 141 139 139  Potassium 3.5 - 5.1 mmol/L 3.8 3.9 3.7  Chloride 98 - 111 mmol/L 107 102 103  CO2 22 - 32 mmol/L '24 25 24  '$ Calcium 8.9 - 10.3 mg/dL 7.1(L) 8.0(L) 8.1(L)  Total Protein 6.5 - 8.1 g/dL 6.6 7.4 -  Total Bilirubin 0.3 - 1.2 mg/dL 0.3 0.4 -  Alkaline Phos 38 - 126 U/L 273(H) 337(H) -  AST 15 - 41 U/L 35 45(H) -  ALT 0 - 44 U/L 19 20 -      RADIOGRAPHIC STUDIES: I have personally reviewed the radiological images as listed and agreed with the findings in the report. No results found.   ASSESSMENT & PLAN:  Beverly Schwartz is a 49 y.o. female with    1. Sigmoid adenocarcinoma, with abdominopelvic adenopathy andhepatic metastasis, stage IV, MSI-stable , KRAS/NRAS/BRAF wild type -Diagnosed in 05/2017. Patient declined intensive chemo regiments previously due to fear of side effects.She progressed on first lineXelodaandVectibixafter 8 months of therapy. -She had PAC placed on 10/19/18. -She recently progressed onsecond lineCAPOX and Avastin. She tolerated poorly due to hand foot syndrome and Neuropathy.  -I started her on third-line Irinotecan and Avastin 01/04/19. Dose was reduced due toneuropathy.  -Due to interval disease progression as seen on 03/08/19 CT CAP, I changed her chemo to FOLFIRI and Vectibix q2weeks starting 04/04/19. 5-fu pump infusion was held for first cycle per pt's request -She did tolerate Irinotecan and Vectibix alone very well 2 weeks ago, and she is overall doing better. I discussed this is not enough to control her disease and moving forward her cancer will become harder to treat. I again strongly encouraged her to try 5FU pump. She is still apprehensive about taking pump home without medical supervision. I reviewed side effects of 5FU and reviewed pump is self regulated. I recommend her to try 24 hr pump infusion this time, instead of the standard 46 hours infusion. She voices good understanding, she is willing to try 5FU over 24 hours.  -Her  physical exam unremarkable except acne skin rash. Labs reviewed, and adequate to proceed with Irinotecan and Vectibix at same dose. Will have pump d/c tomorrow  -F/u in 2 weeks   2. DM, HTN, HL -Continue medications (Amlodipine, lisinopril) and f/u  with PCP. Will closely monitor BP on Avastin.  3. Financial and Social issues, Anxiety, depression -Shefollows up as needed withSocial Worker Hollice Espy -She is currently on Xanax  -Shehas regained insurancewithBlue cross insurancein 2020 -She is more anxious lately due to the [place] pandemic. I will asked SW to f/u with her   4.Goal of care discussion  -She understands the goal of care is palliative, her cancer is incurable. The goal of therapy is to prolong her life and improve her quality of life. -She is full code for now   5.Insomnia, anxiety  -She has had trouble sleeping from anxiety and allergies  -She has Xanax which she uses as needed. -She notes being more anxious lately from [place] and concerns of contracting this.  -I offered her the opportunity to speak with out SW, she is agreeable.   6. 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   7. Skin acne rash, secondary to Vectibix  -Moderately on chest and face -I recommend she restart OTC hydrocortisone cream and I called in Clindamycin gel today (04/19/19)  8. Goal of care discussion  -We again discussed the incurable nature of her cancer, and the overall poor prognosis, especially if she does not have good response to chemotherapy or progress on chemo -The patient understands the goal of care is palliative. -she is full code now     Plan -I called in Clindamycin gel today for her skin rash -Labs reviewed and adequate to proceed with FOLFIRI and vectibix today, will reduc 5FU pump infusion to 50% dose in 24  hours  -Pump d/c tomorrow -Lab, flush, f/u and FOLFIRI and vectibix in 2 weeks    No problem-specific Assessment & Plan notes found for this encounter.   No orders of the defined types were placed in this encounter.  All questions were answered. The patient knows to call the clinic with any problems, questions or concerns. No barriers to learning was detected. The total time spent in the appointment was 40 minutes.     Truitt Merle, MD 04/19/2019   I, Joslyn Devon, am acting as scribe for Truitt Merle, MD.   I have reviewed the above documentation for accuracy and completeness, and I agree with the above.

## 2019-04-18 ENCOUNTER — Inpatient Hospital Stay: Payer: BC Managed Care – PPO | Admitting: Hematology

## 2019-04-18 ENCOUNTER — Inpatient Hospital Stay: Payer: BC Managed Care – PPO

## 2019-04-19 ENCOUNTER — Encounter: Payer: Self-pay | Admitting: Hematology

## 2019-04-19 ENCOUNTER — Inpatient Hospital Stay: Payer: BC Managed Care – PPO

## 2019-04-19 ENCOUNTER — Other Ambulatory Visit: Payer: Self-pay

## 2019-04-19 ENCOUNTER — Inpatient Hospital Stay (HOSPITAL_BASED_OUTPATIENT_CLINIC_OR_DEPARTMENT_OTHER): Payer: BC Managed Care – PPO | Admitting: Hematology

## 2019-04-19 ENCOUNTER — Encounter: Payer: Self-pay | Admitting: General Practice

## 2019-04-19 VITALS — BP 139/80 | HR 87 | Temp 98.2°F | Resp 18 | Ht 65.0 in | Wt 153.3 lb

## 2019-04-19 DIAGNOSIS — C19 Malignant neoplasm of rectosigmoid junction: Secondary | ICD-10-CM

## 2019-04-19 DIAGNOSIS — Z7189 Other specified counseling: Secondary | ICD-10-CM

## 2019-04-19 DIAGNOSIS — D500 Iron deficiency anemia secondary to blood loss (chronic): Secondary | ICD-10-CM

## 2019-04-19 DIAGNOSIS — Z95828 Presence of other vascular implants and grafts: Secondary | ICD-10-CM

## 2019-04-19 DIAGNOSIS — I10 Essential (primary) hypertension: Secondary | ICD-10-CM

## 2019-04-19 DIAGNOSIS — C187 Malignant neoplasm of sigmoid colon: Secondary | ICD-10-CM

## 2019-04-19 LAB — CBC WITH DIFFERENTIAL (CANCER CENTER ONLY)
Abs Immature Granulocytes: 0.04 10*3/uL (ref 0.00–0.07)
Basophils Absolute: 0 10*3/uL (ref 0.0–0.1)
Basophils Relative: 1 %
Eosinophils Absolute: 0.2 10*3/uL (ref 0.0–0.5)
Eosinophils Relative: 5 %
HCT: 35.5 % — ABNORMAL LOW (ref 36.0–46.0)
Hemoglobin: 11.1 g/dL — ABNORMAL LOW (ref 12.0–15.0)
Immature Granulocytes: 1 %
Lymphocytes Relative: 31 %
Lymphs Abs: 1.6 10*3/uL (ref 0.7–4.0)
MCH: 26.9 pg (ref 26.0–34.0)
MCHC: 31.3 g/dL (ref 30.0–36.0)
MCV: 86.2 fL (ref 80.0–100.0)
Monocytes Absolute: 0.6 10*3/uL (ref 0.1–1.0)
Monocytes Relative: 13 %
Neutro Abs: 2.5 10*3/uL (ref 1.7–7.7)
Neutrophils Relative %: 49 %
Platelet Count: 238 10*3/uL (ref 150–400)
RBC: 4.12 MIL/uL (ref 3.87–5.11)
RDW: 15.2 % (ref 11.5–15.5)
WBC Count: 5.1 10*3/uL (ref 4.0–10.5)
nRBC: 0 % (ref 0.0–0.2)

## 2019-04-19 LAB — CMP (CANCER CENTER ONLY)
ALT: 19 U/L (ref 0–44)
AST: 35 U/L (ref 15–41)
Albumin: 3 g/dL — ABNORMAL LOW (ref 3.5–5.0)
Alkaline Phosphatase: 273 U/L — ABNORMAL HIGH (ref 38–126)
Anion gap: 10 (ref 5–15)
BUN: 5 mg/dL — ABNORMAL LOW (ref 6–20)
CO2: 24 mmol/L (ref 22–32)
Calcium: 7.1 mg/dL — ABNORMAL LOW (ref 8.9–10.3)
Chloride: 107 mmol/L (ref 98–111)
Creatinine: 0.62 mg/dL (ref 0.44–1.00)
GFR, Est AFR Am: >60 mL/min (ref >60)
GFR, Estimated: >60 mL/min (ref >60)
Glucose, Bld: 127 mg/dL — ABNORMAL HIGH (ref 70–99)
Potassium: 3.8 mmol/L (ref 3.5–5.1)
Sodium: 141 mmol/L (ref 135–145)
Total Bilirubin: 0.3 mg/dL (ref 0.3–1.2)
Total Protein: 6.6 g/dL (ref 6.5–8.1)

## 2019-04-19 LAB — MAGNESIUM: Magnesium: 1.6 mg/dL — ABNORMAL LOW (ref 1.7–2.4)

## 2019-04-19 MED ORDER — SODIUM CHLORIDE 0.9 % IV SOLN
Freq: Once | INTRAVENOUS | Status: AC
  Filled 2019-04-19: qty 250

## 2019-04-19 MED ORDER — SODIUM CHLORIDE 0.9 % IV SOLN
5.5000 mg/kg | Freq: Once | INTRAVENOUS | Status: AC
  Administered 2019-04-19: 400 mg via INTRAVENOUS
  Filled 2019-04-19: qty 20

## 2019-04-19 MED ORDER — SODIUM CHLORIDE 0.9% FLUSH
10.0000 mL | Freq: Once | INTRAVENOUS | Status: DC
  Filled 2019-04-19: qty 10

## 2019-04-19 MED ORDER — ATROPINE SULFATE 1 MG/ML IJ SOLN
0.5000 mg | Freq: Once | INTRAMUSCULAR | Status: AC | PRN
  Administered 2019-04-19: 0.5 mg via INTRAVENOUS

## 2019-04-19 MED ORDER — HEPARIN SOD (PORK) LOCK FLUSH 100 UNIT/ML IV SOLN
500.0000 [IU] | Freq: Once | INTRAVENOUS | Status: DC | PRN
  Filled 2019-04-19: qty 5

## 2019-04-19 MED ORDER — ATROPINE SULFATE 1 MG/ML IJ SOLN
0.5000 mg | Freq: Once | INTRAMUSCULAR | Status: AC
  Administered 2019-04-19: 14:00:00 0.5 mg via INTRAVENOUS

## 2019-04-19 MED ORDER — SODIUM CHLORIDE 0.9% FLUSH
10.0000 mL | INTRAVENOUS | Status: DC | PRN
  Filled 2019-04-19: qty 10

## 2019-04-19 MED ORDER — ATROPINE SULFATE 1 MG/ML IJ SOLN
INTRAMUSCULAR | Status: AC
  Filled 2019-04-19: qty 1

## 2019-04-19 MED ORDER — SODIUM CHLORIDE 0.9 % IV SOLN
1200.0000 mg/m2 | INTRAVENOUS | Status: DC
  Administered 2019-04-19: 2200 mg via INTRAVENOUS
  Filled 2019-04-19: qty 44

## 2019-04-19 MED ORDER — CLINDAMYCIN PHOSPHATE 1 % EX GEL: Freq: Two times a day (BID) | CUTANEOUS | 2 refills | Status: DC

## 2019-04-19 MED ORDER — PALONOSETRON HCL INJECTION 0.25 MG/5ML
INTRAVENOUS | Status: AC
  Filled 2019-04-19: qty 5

## 2019-04-19 MED ORDER — SODIUM CHLORIDE 0.9 % IV SOLN
400.0000 mg/m2 | Freq: Once | INTRAVENOUS | Status: AC
  Administered 2019-04-19: 13:00:00 740 mg via INTRAVENOUS
  Filled 2019-04-19: qty 37

## 2019-04-19 MED ORDER — DEXAMETHASONE SODIUM PHOSPHATE 10 MG/ML IJ SOLN
10.0000 mg | Freq: Once | INTRAMUSCULAR | Status: AC
  Administered 2019-04-19: 10 mg via INTRAVENOUS

## 2019-04-19 MED ORDER — DEXAMETHASONE SODIUM PHOSPHATE 10 MG/ML IJ SOLN
INTRAMUSCULAR | Status: AC
  Filled 2019-04-19: qty 1

## 2019-04-19 MED ORDER — SODIUM CHLORIDE 0.9 % IV SOLN
160.0000 mg/m2 | Freq: Once | INTRAVENOUS | Status: AC
  Administered 2019-04-19: 300 mg via INTRAVENOUS
  Filled 2019-04-19: qty 15

## 2019-04-19 MED ORDER — PALONOSETRON HCL INJECTION 0.25 MG/5ML
0.2500 mg | Freq: Once | INTRAVENOUS | Status: AC
  Administered 2019-04-19: 0.25 mg via INTRAVENOUS

## 2019-04-19 MED ORDER — FLUOROURACIL CHEMO INJECTION 2.5 GM/50ML
400.0000 mg/m2 | Freq: Once | INTRAVENOUS | Status: AC
  Administered 2019-04-19: 750 mg via INTRAVENOUS
  Filled 2019-04-19: qty 15

## 2019-04-19 NOTE — Progress Notes (Signed)
Ok to treat with mag 1.63.  Dr Burr Medico to rx oral mag.

## 2019-04-19 NOTE — Progress Notes (Signed)
[facility] Spiritual Care Note  Maudie Mercury and I are developing a target routine of meeting in person during infusions and having a phone call in between. She is working to process her sadness and grief regarding prognosis without treatment and uncertainty with treatment. I am providing pastoral presence, empathic listening, questions for reflection, and normalization of feelings.  Maudie Mercury knows to reach out as needed, and I plan to f/u by phone next week, but please also page if needs arise or circumstances change. Thank you.   [place], [place], [facility] Pager [phone] Voicemail [phone]

## 2019-04-19 NOTE — Patient Instructions (Signed)
[facility] Discharge Instructions for Patients Receiving Chemotherapy  Today you received the following chemotherapy agents Vectebix; Irinotecin; Leucovorin; 5-FU  To help prevent nausea and vomiting after your treatment, we encourage you to take your nausea medication as directed   If you develop nausea and vomiting that is not controlled by your nausea medication, call the clinic.   BELOW ARE SYMPTOMS THAT SHOULD BE REPORTED IMMEDIATELY:  *FEVER GREATER THAN 100.5 F  *CHILLS WITH OR WITHOUT FEVER  NAUSEA AND VOMITING THAT IS NOT CONTROLLED WITH YOUR NAUSEA MEDICATION  *UNUSUAL SHORTNESS OF BREATH  *UNUSUAL BRUISING OR BLEEDING  TENDERNESS IN MOUTH AND THROAT WITH OR WITHOUT PRESENCE OF ULCERS  *URINARY PROBLEMS  *BOWEL PROBLEMS  UNUSUAL RASH Items with * indicate a potential emergency and should be followed up as soon as possible.  Feel free to call the clinic should you have any questions or concerns. The clinic phone number is [phone].  Please show the [facility] at check-in to the Emergency Department and triage nurse.  Fluorouracil, 5-FU injection What is this medicine? FLUOROURACIL, 5-FU (flure oh YOOR a sil) is a chemotherapy drug. It slows the growth of cancer cells. This medicine is used to treat many types of cancer like breast cancer, colon or rectal cancer, pancreatic cancer, and stomach cancer. This medicine may be used for other purposes; ask your health care provider or pharmacist if you have questions. COMMON BRAND NAME(S): Adrucil What should I tell my health care provider before I take this medicine? They need to know if you have any of these conditions:  blood disorders  dihydropyrimidine dehydrogenase (DPD) deficiency  infection (especially a virus infection such as chickenpox, cold sores, or herpes)  kidney disease  liver disease  malnourished, poor nutrition  recent or ongoing radiation therapy  an unusual or  allergic reaction to fluorouracil, other chemotherapy, other medicines, foods, dyes, or preservatives  pregnant or trying to get pregnant  breast-feeding How should I use this medicine? This drug is given as an infusion or injection into a vein. It is administered in a hospital or clinic by a specially trained health care professional. Talk to your pediatrician regarding the use of this medicine in children. Special care may be needed. Overdosage: If you think you have taken too much of this medicine contact a poison control center or emergency room at once. NOTE: This medicine is only for you. Do not share this medicine with others. What if I miss a dose? It is important not to miss your dose. Call your doctor or health care professional if you are unable to keep an appointment. What may interact with this medicine?  allopurinol  cimetidine  dapsone  digoxin  hydroxyurea  leucovorin  levamisole  medicines for seizures like ethotoin, fosphenytoin, phenytoin  medicines to increase blood counts like filgrastim, pegfilgrastim, sargramostim  medicines that treat or prevent blood clots like warfarin, enoxaparin, and dalteparin  methotrexate  metronidazole  pyrimethamine  some other chemotherapy drugs like busulfan, cisplatin, estramustine, vinblastine  trimethoprim  trimetrexate  vaccines Talk to your doctor or health care professional before taking any of these medicines:  acetaminophen  aspirin  ibuprofen  ketoprofen  naproxen This list may not describe all possible interactions. Give your health care provider a list of all the medicines, herbs, non-prescription drugs, or dietary supplements you use. Also tell them if you smoke, drink alcohol, or use illegal drugs. Some items may interact with your medicine. What should I watch for  while using this medicine? Visit your doctor for checks on your progress. This drug may make you feel generally unwell. This is  not uncommon, as chemotherapy can affect healthy cells as well as cancer cells. Report any side effects. Continue your course of treatment even though you feel ill unless your doctor tells you to stop. In some cases, you may be given additional medicines to help with side effects. Follow all directions for their use. Call your doctor or health care professional for advice if you get a fever, chills or sore throat, or other symptoms of a cold or flu. Do not treat yourself. This drug decreases your body's ability to fight infections. Try to avoid being around people who are sick. This medicine may increase your risk to bruise or bleed. Call your doctor or health care professional if you notice any unusual bleeding. Be careful brushing and flossing your teeth or using a toothpick because you may get an infection or bleed more easily. If you have any dental work done, tell your dentist you are receiving this medicine. Avoid taking products that contain aspirin, acetaminophen, ibuprofen, naproxen, or ketoprofen unless instructed by your doctor. These medicines may hide a fever. Do not become pregnant while taking this medicine. Women should inform their doctor if they wish to become pregnant or think they might be pregnant. There is a potential for serious side effects to an unborn child. Talk to your health care professional or pharmacist for more information. Do not breast-feed an infant while taking this medicine. Men should inform their doctor if they wish to father a child. This medicine may lower sperm counts. Do not treat diarrhea with over the counter products. Contact your doctor if you have diarrhea that lasts more than 2 days or if it is severe and watery. This medicine can make you more sensitive to the sun. Keep out of the sun. If you cannot avoid being in the sun, wear protective clothing and use sunscreen. Do not use sun lamps or tanning beds/booths. What side effects may I notice from receiving  this medicine? Side effects that you should report to your doctor or health care professional as soon as possible:  allergic reactions like skin rash, itching or hives, swelling of the face, lips, or tongue  low blood counts - this medicine may decrease the number of white blood cells, red blood cells and platelets. You may be at increased risk for infections and bleeding.  signs of infection - fever or chills, cough, sore throat, pain or difficulty passing urine  signs of decreased platelets or bleeding - bruising, pinpoint red spots on the skin, black, tarry stools, blood in the urine  signs of decreased red blood cells - unusually weak or tired, fainting spells, lightheadedness  breathing problems  changes in vision  chest pain  mouth sores  nausea and vomiting  pain, swelling, redness at site where injected  pain, tingling, numbness in the hands or feet  redness, swelling, or sores on hands or feet  stomach pain  unusual bleeding Side effects that usually do not require medical attention (report to your doctor or health care professional if they continue or are bothersome):  changes in finger or toe nails  diarrhea  dry or itchy skin  hair loss  headache  loss of appetite  sensitivity of eyes to the light  stomach upset  unusually teary eyes This list may not describe all possible side effects. Call your doctor for medical advice about side  effects. You may report side effects to FDA at 1-800-FDA-1088. Where should I keep my medicine? This drug is given in a hospital or clinic and will not be stored at home. NOTE: This sheet is a summary. It may not cover all possible information. If you have questions about this medicine, talk to your doctor, pharmacist, or health care provider.  2020 [employer] (2007-07-14 13:53:16)

## 2019-04-19 NOTE — Progress Notes (Signed)
Left patient voicemail on 04/19/19 to introduce myself and the counseling service available at [facility]. Patient encouraged to reach out to me by phone to schedule a free telehealth counseling session.   Beverly Schwartz [facility] Voicemail: [phone]

## 2019-04-20 ENCOUNTER — Inpatient Hospital Stay: Payer: BC Managed Care – PPO

## 2019-04-20 ENCOUNTER — Other Ambulatory Visit: Payer: Self-pay

## 2019-04-20 ENCOUNTER — Telehealth: Payer: Self-pay | Admitting: Hematology

## 2019-04-20 VITALS — BP 132/88 | HR 88 | Temp 98.2°F | Resp 18

## 2019-04-20 DIAGNOSIS — C19 Malignant neoplasm of rectosigmoid junction: Secondary | ICD-10-CM

## 2019-04-20 DIAGNOSIS — Z7189 Other specified counseling: Secondary | ICD-10-CM

## 2019-04-20 MED ORDER — HEPARIN SOD (PORK) LOCK FLUSH 100 UNIT/ML IV SOLN
500.0000 [IU] | Freq: Once | INTRAVENOUS | Status: AC | PRN
  Administered 2019-04-20: 500 [IU]
  Filled 2019-04-20: qty 5

## 2019-04-20 MED ORDER — SODIUM CHLORIDE 0.9% FLUSH
10.0000 mL | INTRAVENOUS | Status: DC | PRN
  Administered 2019-04-20: 10 mL
  Filled 2019-04-20: qty 10

## 2019-04-20 NOTE — Telephone Encounter (Signed)
No los per 1/26. 

## 2019-04-26 ENCOUNTER — Encounter: Payer: Self-pay | Admitting: General Practice

## 2019-04-26 NOTE — Progress Notes (Signed)
[facility] Note  Followed up with Beverly Schwartz by phone. She is processing what she has heard so far about her prognosis, especially as she discerns whether or not to continue the pump (which she really disliked). I will continue to follow as a conversation partner and for emotional support. We plan to follow up in person at her next treatment, but please always page if needs arise or circumstances change. Thank you.   [place], [place], [facility] Pager [phone] Voicemail [phone]

## 2019-04-27 ENCOUNTER — Encounter: Payer: Self-pay | Admitting: Hematology

## 2019-04-27 NOTE — Progress Notes (Signed)
[place]   [phone]) [phone] Fax:(336) [phone]   Clinic Follow up Note   Patient Care Team: Esaw Grandchild, NP as PCP - General (Family Medicine) Delrae Rend, MD as Consulting Physician (Endocrinology) Truitt Merle, MD as Consulting Physician (Hematology) Alla Feeling, NP as Nurse Practitioner (Nurse Practitioner) Clarene Essex, MD as Consulting Physician (Gastroenterology)  Date of Service:  05/02/2019  CHIEF COMPLAINT: F/u metastatic colon cancer  SUMMARY OF ONCOLOGIC HISTORY: Oncology History Overview Note  Cancer Staging Malignant neoplasm of rectosigmoid junction [facility]) Staging form: Colon and Rectum, AJCC 8th Edition - Clinical stage from 06/11/2017: Stage IVA (cTX, cN1b, pM1a) - Signed by Truitt Merle, MD on 06/15/2017     Malignant neoplasm of rectosigmoid junction ([place])  05/30/2017 Imaging   CT AP IMPRESSION: 1. 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 Abdominopelvic adenopathy. Rectosigmoid mass with suggestion of partial obstruction as evidenced by large colonic stool burden more proximally. 2.  Aortic Atherosclerosis (ICD10-I70.0).  This is age advanced.   05/31/2017 Tumor Marker   CEA 353.3 (elevated) AFP 4.5 (normal)   05/31/2017 Initial Biopsy   Diagnosis Colon, biopsy, sigmoid - INVASIVE ADENOCARCINOMA - SEE COMMENT   05/31/2017 Imaging   CT CHEST IMPRESSION: 1. No evidence of metastatic disease in the chest. 2. No acute findings.  Aortic Atherosclerosis (ICD10-I70.0).    05/31/2017 Procedure   Colonoscopy per Dr. Watt Climes Findings: An infiltrative and ulcerated partially obstructing medium-sized mass was found in the recto-sigmoid colon. The mass was circumferential. The mass measured four cm in length. No bleeding was present.   Impression - One small polyp in the rectum. - The examination was otherwise normal. - Malignant partially obstructing tumor in the  recto-sigmoid colon. Biopsied. - One medium polyp in the proximal sigmoid colon. - The examination was otherwise normal. - Internal hemorrhoids.   06/03/2017 Initial Diagnosis   Malignant neoplasm of transverse colon ([place])   06/11/2017 Cancer Staging   Staging form: Colon and Rectum, AJCC 8th Edition - Clinical stage from 06/11/2017: Stage IVA (cTX, cN1b, pM1a) - Signed by Truitt Merle, MD on 06/15/2017   06/11/2017 Pathology Results   Liver biopsy confirmed metastatic colon cancer   07/23/2017 Imaging   CT CAP IMPRESSION: 1. Mild progression of hepatic metastasis. 2. Similar rectosigmoid primary with abdominopelvic nodal metastasis. 3.  No acute process or evidence of metastatic disease in the chest. 4. Aortic Atherosclerosis (ICD10-I70.0). Coronary artery atherosclerosis. This is age advanced. 5. Proximal colonic constipation, again suggesting a component of partial obstruction at the primary site. 6. Mild ascending aortic dilatation at 4.1 cm.   08/25/2017 - 05/10/2018 Chemotherapy   -Xeloda '1500mg'$  BID 1 week on and 1 week off starting 08/25/17. Due to her worsening hand-foot syndrome her dose was reduced to '1500mg'$  in the AM and '1000mg'$  in the PM. Due to disease progression we swithced her to CAPOX  -Vectibix every 2 weeks starting 08/25/17. Due to skin rash will stop starting 05/10/18.     12/14/2017 Imaging   IMPRESSION: 1. Response to therapy. Marked decrease in hepatic metastatic burden. More mild decrease in abdominopelvic adenopathy and definition of rectosigmoid primary. 2.  No acute process or evidence of metastatic disease in the chest. 3.  Aortic Atherosclerosis (ICD10-I70.0).    04/28/2018 Imaging   CT CAP 04/28/18  IMPRESSION: 1. Index liver metastases measured on the previous study show no substantial interval change although new liver lesions on today's exam are concerning for progressive disease. 2. Mild lymphadenopathy  in the upper abdomen is stable. 3. Persistent mild  ill-defined wall thickening in the distal sigmoid colon near the rectosigmoid junction. 4.  Aortic Atherosclerois (ICD10-170.0)    05/17/2018 - 12/02/2018 Chemotherapy   CAPOX every 3 weeks with Xeloda '1500mg'$  BID 2 weeks on/1week off starting 05/17/18 -Add Avastin to first cycle.    09/09/2018 Imaging   CT CAP W Contrast IMPRESSION: 1. Slight interval decrease in size one of the right hepatic lobe lesions. Additional lesions within the liver are similar when compared to recent prior exam. 2. Mild adenopathy within the abdomen is stable. 3. Similar-appearing thick walled distal sigmoid colon.   12/20/2018 Imaging   CT CAP W Contrast  IMPRESSION: 1. Increase in size and number of hepatic metastatic lesions compared to the prior study. 2. Stable adenopathy in the abdomen. 3. Signs of sigmoid wall thickening as before.   01/04/2019 - 01/28/2019 Chemotherapy   Irinotecan and Avastin q2weeks starting 01/04/19.  Stoppes due to disease progression.    03/08/2019 Imaging   CT CAP W Contrast  IMPRESSION: 1. Interval progression of liver metastasis. 2. Stable borderline enlarged upper abdominal lymph node. 3. Aortic atherosclerosis.   Aortic Atherosclerosis (ICD10-I70.0).   04/04/2019 -  Chemotherapy   FOLFIRI and Vectibix q2weeks starting 04/04/19      CURRENT THERAPY:  Irinotecan and Vectibix q2weeks starting 04/04/19. 50% 5FU with C2 only. Will add Xeloda '1000mg'$  BID 1 week on/1 week off starting with C4.   INTERVAL HISTORY:  Beverly Schwartz is here for a follow up and treatment. She presents to the clinic alone. She notes with last chemo with 5FU she was nauseous and in bed for at least 6 days. She notes she was so tired she did not want to get up to the bathroom. She notes she has recovered now with residual fatigue. She notes she did take antiemetics, no vomiting. She denies diarrhea, but noted 1 day of more BMs. She notes mild neuropathy in her fingers. She notes her skin rash  is doing better.   She notes she has her son this week given his father's fiance is sick and not sure if this is COVID19.     REVIEW OF SYSTEMS:   Constitutional: Denies fevers, chills or abnormal weight loss (+) fatigue.  Eyes: Denies blurriness of vision Ears, nose, mouth, throat, and face: Denies mucositis or sore throat Respiratory: Denies cough, dyspnea or wheezes Cardiovascular: Denies palpitation, chest discomfort or lower extremity swelling Gastrointestinal:  Denies nausea, heartburn or change in bowel habits Skin: (+) Improved acne skin rash  Lymphatics: Denies new lymphadenopathy or easy bruising Neurological: (+) Mild neuropathy in fingers  Behavioral/Psych: Mood is stable, no new changes  All other systems were reviewed with the patient and are negative.  MEDICAL HISTORY:  Past Medical History:  Diagnosis Date  . Anxiety   . Cancer ([place])    colon, liver  . Chest pain 10/18/2018   Patient c/o chest pain right side yesterday intermitten lasting 3-4 hours.  [name], PA notified  . Colon cancer ([place])   . Complication of anesthesia   . Depression   . Diabetes mellitus   . Dizziness   . Family history of breast cancer   . Family history of stomach cancer   . Hyperlipidemia   . Hypertension   . Hypothyroidism   . Obesity   . PONV (postoperative nausea and vomiting)   . Tachycardia     SURGICAL HISTORY: Past Surgical History:  Procedure Laterality Date  .  FLEXIBLE SIGMOIDOSCOPY N/A 05/31/2017   Procedure: FLEXIBLE SIGMOIDOSCOPY;  Surgeon: Clarene Essex, MD;  Location: [facility] ENDOSCOPY;  Service: Endoscopy;  Laterality: N/A;  . IR IMAGING GUIDED PORT INSERTION  10/19/2018  . WISDOM TOOTH EXTRACTION     age 38's    I have reviewed the social history and family history with the patient and they are unchanged from previous note.  ALLERGIES:  is allergic to bupropion and amoxicillin.  MEDICATIONS:  Current Outpatient Medications  Medication Sig Dispense Refill   . acetaminophen-codeine (TYLENOL #3) 300-30 MG tablet Take 1 tablet by mouth every 6 (six) hours as needed for moderate pain. 30 tablet 0  . albuterol (PROVENTIL HFA;VENTOLIN HFA) 108 (90 Base) MCG/ACT inhaler Inhale 2 puffs into the lungs every 6 (six) hours as needed for wheezing or shortness of breath. 1 Inhaler 0  . ALPRAZolam (XANAX) 1 MG tablet TAKE 1 TABLET BY MOUTH AT BEDTIME AS NEEDED FOR ANXIETY (Patient taking differently: Take 1 mg by mouth at bedtime as needed for anxiety. ) 30 tablet 0  . amLODipine (NORVASC) 10 MG tablet Take 1 tablet (10 mg total) by mouth daily. 30 tablet 0  . benzonatate (TESSALON) 100 MG capsule Take 1 capsule (100 mg total) by mouth 3 (three) times daily as needed for cough. 30 capsule 0  . capecitabine (XELODA) 500 MG tablet Take 2 tablets (1,000 mg total) by mouth 2 (two) times daily after a meal. Start on 05/16/2019 28 tablet 0  . carbamide peroxide (DEBROX) 6.5 % OTIC solution Place 5 drops into both ears 2 (two) times daily. 15 mL 0  . CINNAMON PO Take 1 tablet by mouth daily.     . clindamycin (CLINDAGEL) 1 % gel Apply topically 2 (two) times daily. 30 g 2  . fluticasone (FLONASE) 50 MCG/ACT nasal spray Place 1 spray into both nostrils daily. (Patient taking differently: Place 1 spray into both nostrils daily as needed for allergies. ) 16 g 2  . insulin NPH-regular Human (NOVOLIN 70/30) (70-30) 100 UNIT/ML injection INJECT 10 UNITS WITH BREAKFAST, 10 UNITS WITH DINNER (Patient taking differently: Inject 10 Units into the skin 2 (two) times daily with a meal. ) 10 mL 2  . Insulin Syringe-Needle U-100 (BD INSULIN SYRINGE U/F) 31G X 5/16" 1 ML MISC Inject twice a day as directed 200 each 3  . levothyroxine (SYNTHROID) 112 MCG tablet Take 1 tablet (112 mcg total) by mouth daily before breakfast. LABS REQUIRED PRIOR TO ANY FURTHER REFILLS. 30 tablet 0  . lidocaine-prilocaine (EMLA) cream Apply 1 application topically as needed. 30 g 1  . lisinopril (ZESTRIL) 5 MG  tablet TAKE 1 TABLET BY MOUTH EVERY DAY 90 tablet 0  . magnesium oxide (MAG-OX) 400 (241.3 Mg) MG tablet Take 1 tablet (400 mg total) by mouth daily. 90 tablet 1  . magnesium oxide (MAG-OX) 400 MG tablet Take 400 mg by mouth 2 (two) times daily.    . mometasone (ELOCON) 0.1 % cream Apply 1 application topically daily as needed.    . Omega-3 Fatty Acids (FISH OIL) 1000 MG CAPS Take 1,000 mg by mouth daily.     . polyethylene glycol (MIRALAX / GLYCOLAX) packet Take 17 g by mouth daily. 14 each 0  . TURMERIC PO Take 1 capsule by mouth daily.      No current facility-administered medications for this visit.   Facility-Administered Medications Ordered in Other Visits  Medication Dose Route Frequency Provider Last Rate Last Admin  . 0.9 %  sodium  chloride infusion   Intravenous Continuous Truitt Merle, MD   Stopped at 11/11/18 1330    PHYSICAL EXAMINATION: ECOG PERFORMANCE STATUS: 2 - Symptomatic, <50% confined to bed  Vitals:   05/02/19 0935  BP: 115/79  Pulse: (!) 126  Resp: 17  Temp: 97.9 F (36.6 C)  SpO2: 97%   Filed Weights   05/02/19 0935  Weight: 154 lb (69.9 kg)    Due to COVID19 we will limit examination to appearance. Patient had no complaints.  GENERAL:alert, no distress and comfortable SKIN: skin color normal, no rashes or significant lesions except mild acne like rash on her face  EYES: normal, Conjunctiva are pink and non-injected, sclera clear  NEURO: alert & oriented x 3 with fluent speech   LABORATORY DATA:  I have reviewed the data as listed CBC Latest Ref Rng & Units 05/02/2019 04/19/2019 03/30/2019  WBC 4.0 - 10.5 K/uL 5.3 5.1 12.7(H)  Hemoglobin 12.0 - 15.0 g/dL 10.9(L) 11.1(L) 11.3(L)  Hematocrit 36.0 - 46.0 % 35.5(L) 35.5(L) 35.6(L)  Platelets 150 - 400 K/uL 265 238 290     CMP Latest Ref Rng & Units 04/19/2019 03/30/2019 03/29/2019  Glucose 70 - 99 mg/dL 127(H) 250(H) 190(H)  BUN 6 - 20 mg/dL 5(L) 11 11  Creatinine 0.44 - 1.00 mg/dL 0.62 0.81 0.53  Sodium  135 - 145 mmol/L 141 139 139  Potassium 3.5 - 5.1 mmol/L 3.8 3.9 3.7  Chloride 98 - 111 mmol/L 107 102 103  CO2 22 - 32 mmol/L '24 25 24  '$ Calcium 8.9 - 10.3 mg/dL 7.1(L) 8.0(L) 8.1(L)  Total Protein 6.5 - 8.1 g/dL 6.6 7.4 -  Total Bilirubin 0.3 - 1.2 mg/dL 0.3 0.4 -  Alkaline Phos 38 - 126 U/L 273(H) 337(H) -  AST 15 - 41 U/L 35 45(H) -  ALT 0 - 44 U/L 19 20 -      RADIOGRAPHIC STUDIES: I have personally reviewed the radiological images as listed and agreed with the findings in the report. No results found.   ASSESSMENT & PLAN:  Beverly Schwartz is a 49 y.o. female with   1. Sigmoid adenocarcinoma, with abdominopelvic adenopathy andhepatic metastasis, stage IV, MSI-stable , KRAS/NRAS/BRAF wild type -Diagnosed in 05/2017. Patient declined intensive chemo regiments previously due to fear of side effects.She progressed on first lineXelodaandVectibixafter 8 months of therapy. -She had PAC placed on 10/19/18. -She recently progressed onsecond lineCAPOX and Avastin. She tolerated poorly due to hand foot syndrome and Neuropathy.  -I started her on third-line Irinotecan and Avastin 01/04/19. Dose was reduced due toneuropathy.  -Due to interval disease progression as seen on 03/08/19 CT CAP, I changed her chemo to FOLFIRI and Vectibix q2weeks starting 04/04/19. 5-fu pump infusion was held for first cycle per pt's request and added over 24 Hr for C2 (50% normal dose).  -She did not tolerate the 1 day 5FU pump infusion well and does not want to retry. She overall had significant fatigue and nausea managed with antiemetics. She has recovered well now  -I discussed in rare situations I would combine Irinotecan with low dose Xeloda. Given she has tolerated Xeloda well before we will try it. She is agreeable. Plan to start Xeloda at lower dose '1000mg'$  BID  1 week on/1 week off starting with C4. I discussed watching for diarrhea and Hand-foot syndrome.  -Labs reviewed and adequate to proceed  with C3 Irinotecan and Vectibix alone today. CEA was elevated last time, still pending for today.  -Plan to scan her again  in 06/2019.  -F/u in 2 weeks.    2. DM, HTN, HL -Continue medications (Amlodipine, lisinopril) and f/u with PCP. Will closely monitor BP on Avastin.  3. Financial and Social issues, Anxiety, depression -Shefollows up as needed withSocial Worker Hollice Espy -She is currently on Xanax  -Shehas regained insurancewithBlue cross insurancein 2020 -She is more anxious lately due to the [place] pandemic. I will asked SW to f/u with her   4.Goal of care discussion  -She understands the goal of care is palliative, her cancer is incurable. The goal of therapy is to prolong her life and improve her quality of life. -She is full code for now   5.Insomnia, anxiety  -She has had trouble sleeping from anxiety and allergies  -She has Xanax which she uses as needed. -She notes being more anxious lately from [place] and concerns of contracting this.  -She can continue to f/u with our SW as needed. Mood is stable.   6. 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   7. Skin acne rash, secondary to Vectibix  -Moderately on chest and face -She has OTC hydrocortisone cream and Clindamycin gel, continue to use. Rash has been mild.   8. Goal of care discussion  -The patient understands the goal of care is palliative. -she is full code now    Plan -I refilled EMLA cream and Magnesium today  -I called in Xeloda today to start at next visit.  -Labs reviewed and adequate to proceed with C3 Irinotecan and vectibix today, no 5-fu today  -Lab, flush, f/u and Irinotecan and vectibix in 2 weeks    No problem-specific Assessment & Plan notes found for this encounter.   Orders Placed This  Encounter  Procedures  . Magnesium    Standing Status:   Standing    Number of Occurrences:   26    Standing Expiration Date:   05/01/2020   All questions were answered. The patient knows to call the clinic with any problems, questions or concerns. No barriers to learning was detected. The total time spent in the appointment was 30 minutes.     Truitt Merle, MD 05/02/2019   I, Joslyn Devon, am acting as scribe for Truitt Merle, MD.   I have reviewed the above documentation for accuracy and completeness, and I agree with the above.

## 2019-04-28 ENCOUNTER — Encounter: Payer: Self-pay | Admitting: Hematology

## 2019-04-29 ENCOUNTER — Other Ambulatory Visit: Payer: Self-pay | Admitting: *Deleted

## 2019-04-29 ENCOUNTER — Other Ambulatory Visit: Payer: Self-pay | Admitting: Hematology

## 2019-04-29 ENCOUNTER — Telehealth: Payer: Self-pay | Admitting: Hematology

## 2019-04-29 ENCOUNTER — Encounter: Payer: Self-pay | Admitting: Nurse Practitioner

## 2019-04-29 DIAGNOSIS — I10 Essential (primary) hypertension: Secondary | ICD-10-CM

## 2019-04-29 DIAGNOSIS — C19 Malignant neoplasm of rectosigmoid junction: Secondary | ICD-10-CM

## 2019-04-29 MED ORDER — AMLODIPINE BESYLATE 10 MG PO TABS: 10.0000 mg | ORAL_TABLET | Freq: Every day | ORAL | 0 refills | Status: DC

## 2019-04-29 NOTE — Telephone Encounter (Signed)
I spoke with Beverly Schwartz she wants her appointments no earlier than 0930 and not on Mondays.  I told her it was too late to change 2/8 appointments.  I sent a scheduling message to rescheduled 2/22 appts to 2/24 and not before 0930 per pt's request.

## 2019-04-29 NOTE — Telephone Encounter (Signed)
I talk with patient regading reschedule per nurse 2/5 sch msg asked if it could be moved to 2/24 the hours were not available. The nurse did ask about 2/25. Patient is aware

## 2019-05-02 ENCOUNTER — Encounter: Payer: Self-pay | Admitting: Hematology

## 2019-05-02 ENCOUNTER — Other Ambulatory Visit: Payer: Self-pay

## 2019-05-02 ENCOUNTER — Telehealth: Payer: Self-pay

## 2019-05-02 ENCOUNTER — Inpatient Hospital Stay: Payer: BC Managed Care – PPO

## 2019-05-02 ENCOUNTER — Telehealth: Payer: Self-pay | Admitting: Hematology

## 2019-05-02 ENCOUNTER — Inpatient Hospital Stay: Payer: BC Managed Care – PPO | Attending: Hematology

## 2019-05-02 ENCOUNTER — Encounter: Payer: Self-pay | Admitting: General Practice

## 2019-05-02 ENCOUNTER — Inpatient Hospital Stay (HOSPITAL_BASED_OUTPATIENT_CLINIC_OR_DEPARTMENT_OTHER): Payer: BC Managed Care – PPO | Admitting: Hematology

## 2019-05-02 VITALS — BP 115/79 | HR 126 | Temp 97.9°F | Resp 17 | Ht 65.0 in | Wt 154.0 lb

## 2019-05-02 VITALS — HR 92

## 2019-05-02 DIAGNOSIS — C19 Malignant neoplasm of rectosigmoid junction: Secondary | ICD-10-CM

## 2019-05-02 DIAGNOSIS — Z79899 Other long term (current) drug therapy: Secondary | ICD-10-CM | POA: Insufficient documentation

## 2019-05-02 DIAGNOSIS — Z5111 Encounter for antineoplastic chemotherapy: Secondary | ICD-10-CM | POA: Diagnosis present

## 2019-05-02 DIAGNOSIS — C187 Malignant neoplasm of sigmoid colon: Secondary | ICD-10-CM

## 2019-05-02 DIAGNOSIS — I10 Essential (primary) hypertension: Secondary | ICD-10-CM | POA: Diagnosis not present

## 2019-05-02 DIAGNOSIS — Z7189 Other specified counseling: Secondary | ICD-10-CM

## 2019-05-02 DIAGNOSIS — C787 Secondary malignant neoplasm of liver and intrahepatic bile duct: Secondary | ICD-10-CM | POA: Diagnosis not present

## 2019-05-02 DIAGNOSIS — Z5189 Encounter for other specified aftercare: Secondary | ICD-10-CM | POA: Insufficient documentation

## 2019-05-02 DIAGNOSIS — Z5112 Encounter for antineoplastic immunotherapy: Secondary | ICD-10-CM | POA: Insufficient documentation

## 2019-05-02 DIAGNOSIS — D500 Iron deficiency anemia secondary to blood loss (chronic): Secondary | ICD-10-CM

## 2019-05-02 DIAGNOSIS — Z95828 Presence of other vascular implants and grafts: Secondary | ICD-10-CM

## 2019-05-02 LAB — FERRITIN: Ferritin: 106 ng/mL (ref 11–307)

## 2019-05-02 LAB — IRON AND TIBC
Iron: 40 ug/dL — ABNORMAL LOW (ref 41–142)
Saturation Ratios: 13 % — ABNORMAL LOW (ref 21–57)
TIBC: 308 ug/dL (ref 236–444)
UIBC: 268 ug/dL (ref 120–384)

## 2019-05-02 LAB — CMP (CANCER CENTER ONLY)
ALT: 18 U/L (ref 0–44)
AST: 31 U/L (ref 15–41)
Albumin: 3.1 g/dL — ABNORMAL LOW (ref 3.5–5.0)
Alkaline Phosphatase: 288 U/L — ABNORMAL HIGH (ref 38–126)
Anion gap: 10 (ref 5–15)
BUN: 6 mg/dL (ref 6–20)
CO2: 25 mmol/L (ref 22–32)
Calcium: 6.3 mg/dL — CL (ref 8.9–10.3)
Chloride: 108 mmol/L (ref 98–111)
Creatinine: 0.65 mg/dL (ref 0.44–1.00)
GFR, Est AFR Am: >60 mL/min (ref >60)
GFR, Estimated: >60 mL/min (ref >60)
Glucose, Bld: 133 mg/dL — ABNORMAL HIGH (ref 70–99)
Potassium: 3.8 mmol/L (ref 3.5–5.1)
Sodium: 143 mmol/L (ref 135–145)
Total Bilirubin: 0.3 mg/dL (ref 0.3–1.2)
Total Protein: 6.5 g/dL (ref 6.5–8.1)

## 2019-05-02 LAB — CBC WITH DIFFERENTIAL (CANCER CENTER ONLY)
Abs Immature Granulocytes: 0.02 10*3/uL (ref 0.00–0.07)
Basophils Absolute: 0 10*3/uL (ref 0.0–0.1)
Basophils Relative: 1 %
Eosinophils Absolute: 0.2 10*3/uL (ref 0.0–0.5)
Eosinophils Relative: 3 %
HCT: 35.5 % — ABNORMAL LOW (ref 36.0–46.0)
Hemoglobin: 10.9 g/dL — ABNORMAL LOW (ref 12.0–15.0)
Immature Granulocytes: 0 %
Lymphocytes Relative: 28 %
Lymphs Abs: 1.5 10*3/uL (ref 0.7–4.0)
MCH: 26.8 pg (ref 26.0–34.0)
MCHC: 30.7 g/dL (ref 30.0–36.0)
MCV: 87.2 fL (ref 80.0–100.0)
Monocytes Absolute: 0.5 10*3/uL (ref 0.1–1.0)
Monocytes Relative: 9 %
Neutro Abs: 3.1 10*3/uL (ref 1.7–7.7)
Neutrophils Relative %: 59 %
Platelet Count: 265 10*3/uL (ref 150–400)
RBC: 4.07 MIL/uL (ref 3.87–5.11)
RDW: 15.5 % (ref 11.5–15.5)
WBC Count: 5.3 10*3/uL (ref 4.0–10.5)
nRBC: 0 % (ref 0.0–0.2)

## 2019-05-02 LAB — TOTAL PROTEIN, URINE DIPSTICK: Protein, ur: <30 mg/dL — AB

## 2019-05-02 LAB — MAGNESIUM: Magnesium: 1.3 mg/dL — CL (ref 1.7–2.4)

## 2019-05-02 LAB — CEA (IN HOUSE-CHCC): CEA (CHCC-In House): 316.1 ng/mL — ABNORMAL HIGH (ref 0.00–5.00)

## 2019-05-02 MED ORDER — LIDOCAINE-PRILOCAINE 2.5-2.5 % EX CREA: 1.0000 "application " | TOPICAL_CREAM | CUTANEOUS | 1 refills | Status: AC | PRN

## 2019-05-02 MED ORDER — SODIUM CHLORIDE 0.9% FLUSH
10.0000 mL | INTRAVENOUS | Status: DC | PRN
  Administered 2019-05-02: 10 mL
  Filled 2019-05-02: qty 10

## 2019-05-02 MED ORDER — ATROPINE SULFATE 0.4 MG/ML IJ SOLN
0.4000 mg | Freq: Once | INTRAMUSCULAR | Status: AC
  Administered 2019-05-02: 0.4 mg via INTRAVENOUS

## 2019-05-02 MED ORDER — DEXAMETHASONE SODIUM PHOSPHATE 10 MG/ML IJ SOLN
INTRAMUSCULAR | Status: AC
  Filled 2019-05-02: qty 1

## 2019-05-02 MED ORDER — PALONOSETRON HCL INJECTION 0.25 MG/5ML
INTRAVENOUS | Status: AC
  Filled 2019-05-02: qty 5

## 2019-05-02 MED ORDER — ATROPINE SULFATE 0.4 MG/ML IJ SOLN
INTRAMUSCULAR | Status: AC
  Filled 2019-05-02: qty 1

## 2019-05-02 MED ORDER — SODIUM CHLORIDE 0.9% FLUSH
10.0000 mL | Freq: Once | INTRAVENOUS | Status: AC
  Administered 2019-05-02: 10 mL
  Filled 2019-05-02: qty 10

## 2019-05-02 MED ORDER — SODIUM CHLORIDE 0.9 % IV SOLN
160.0000 mg/m2 | Freq: Once | INTRAVENOUS | Status: AC
  Administered 2019-05-02: 13:00:00 300 mg via INTRAVENOUS
  Filled 2019-05-02: qty 15

## 2019-05-02 MED ORDER — ATROPINE SULFATE 1 MG/ML IJ SOLN: 0.5000 mg | Freq: Once | INTRAMUSCULAR | Status: DC | PRN

## 2019-05-02 MED ORDER — SODIUM CHLORIDE 0.9 % IV SOLN
Freq: Once | INTRAVENOUS | Status: AC
  Filled 2019-05-02: qty 100

## 2019-05-02 MED ORDER — SODIUM CHLORIDE 0.9 % IV SOLN
Freq: Once | INTRAVENOUS | Status: AC
  Filled 2019-05-02: qty 250

## 2019-05-02 MED ORDER — MAGNESIUM OXIDE 400 (241.3 MG) MG PO TABS: 400.0000 mg | ORAL_TABLET | Freq: Every day | ORAL | 1 refills | Status: AC

## 2019-05-02 MED ORDER — CAPECITABINE 500 MG PO TABS: 1000.0000 mg | ORAL_TABLET | Freq: Two times a day (BID) | ORAL | 0 refills | Status: DC

## 2019-05-02 MED ORDER — DEXAMETHASONE SODIUM PHOSPHATE 10 MG/ML IJ SOLN
10.0000 mg | Freq: Once | INTRAMUSCULAR | Status: AC
  Administered 2019-05-02: 10 mg via INTRAVENOUS

## 2019-05-02 MED ORDER — SODIUM CHLORIDE 0.9 % IV SOLN
5.5000 mg/kg | Freq: Once | INTRAVENOUS | Status: AC
  Administered 2019-05-02: 400 mg via INTRAVENOUS
  Filled 2019-05-02: qty 20

## 2019-05-02 MED ORDER — HEPARIN SOD (PORK) LOCK FLUSH 100 UNIT/ML IV SOLN
500.0000 [IU] | Freq: Once | INTRAVENOUS | Status: AC | PRN
  Administered 2019-05-02: 500 [IU]
  Filled 2019-05-02: qty 5

## 2019-05-02 MED ORDER — MAGNESIUM SULFATE 2 GM/50ML IV SOLN
INTRAVENOUS | Status: AC
  Filled 2019-05-02: qty 50

## 2019-05-02 MED ORDER — MAGNESIUM SULFATE 2 GM/50ML IV SOLN
2.0000 g | Freq: Once | INTRAVENOUS | Status: AC
  Administered 2019-05-02: 2 g via INTRAVENOUS

## 2019-05-02 MED ORDER — SODIUM CHLORIDE 0.9 % IV SOLN: 2.0000 g | Freq: Once | INTRAVENOUS | Status: DC

## 2019-05-02 MED ORDER — PALONOSETRON HCL INJECTION 0.25 MG/5ML
0.2500 mg | Freq: Once | INTRAVENOUS | Status: AC
  Administered 2019-05-02: 0.25 mg via INTRAVENOUS

## 2019-05-02 NOTE — Progress Notes (Signed)
Received call from Dr. Burr Medico re: Mg & Ca suppl Mg = 1.3 --> Mg 2 g IV today per MD CCa++ = 7.02 --> Calcium gluc 2 g IV today per MD.  Kennith Center, Pharm.D., CPP 05/02/2019@10 :47 AM

## 2019-05-02 NOTE — Progress Notes (Signed)
CRITICAL VALUE STICKER  CRITICAL VALUE:Calcium 6.3 & Magnesium 1.3  DATE & TIME NOTIFIED: 05/02/2019 @ 1029  MESSENGER (representative from lab): Delsa Sale  MD NOTIFIED: Dr. Burr Medico, Krista Blue  TIME OF NOTIFICATION: [id number]  RESPONSE: MD to address.

## 2019-05-02 NOTE — Patient Instructions (Addendum)
Per Dr. Burr Medico, Increase oral Magnesium to 3 times a day, Magnesium 400 mg 3 times a day. Take over the counter oral Calcium 1200 mg daily and Vitamin D supplement 1000-2000 Units per day.   [place] Discharge Instructions for Patients Receiving Chemotherapy  Today you received the following chemotherapy agents:  Irinotecan, Leucovorin, Fluorouracil, and Immunotherapy: Panitumumab  To help prevent nausea and vomiting after your treatment, we encourage you to take your nausea medication as directed by you MD.   If you develop nausea and vomiting that is not controlled by your nausea medication, call the clinic.   BELOW ARE SYMPTOMS THAT SHOULD BE REPORTED IMMEDIATELY:  *FEVER GREATER THAN 100.5 F  *CHILLS WITH OR WITHOUT FEVER  NAUSEA AND VOMITING THAT IS NOT CONTROLLED WITH YOUR NAUSEA MEDICATION  *UNUSUAL SHORTNESS OF BREATH  *UNUSUAL BRUISING OR BLEEDING  TENDERNESS IN MOUTH AND THROAT WITH OR WITHOUT PRESENCE OF ULCERS  *URINARY PROBLEMS  *BOWEL PROBLEMS  UNUSUAL RASH Items with * indicate a potential emergency and should be followed up as soon as possible.  Feel free to call the clinic should you have any questions or concerns. The clinic phone number is [phone].  Please show the [facility] at check-in to the Emergency Department and triage nurse.   Hypomagnesemia Hypomagnesemia is a condition in which the level of magnesium in the blood is low. Magnesium is a mineral that is found in many foods. It is used in many different processes in the body. Hypomagnesemia can affect every organ in the body. In severe cases, it can cause life-threatening problems. What are the causes? This condition may be caused by:  Not getting enough magnesium in your diet.  Malnutrition.  Problems with absorbing magnesium from the intestines.  Dehydration.  Alcohol abuse.  Vomiting.  Severe or chronic diarrhea.  Some medicines, including medicines  that make you urinate more (diuretics).  Certain diseases, such as kidney disease, diabetes, celiac disease, and overactive thyroid. What are the signs or symptoms? Symptoms of this condition include:  Loss of appetite.  Nausea and vomiting.  Involuntary shaking or trembling of a body part (tremor).  Muscle weakness.  Tingling in the arms and legs.  Sudden tightening of muscles (muscle spasms).  Confusion.  Psychiatric issues, such as depression, irritability, or psychosis.  A feeling of fluttering of the heart.  Seizures. These symptoms are more severe if magnesium levels drop suddenly. How is this diagnosed? This condition may be diagnosed based on:  Your symptoms and medical history.  A physical exam.  Blood and urine tests. How is this treated? Treatment depends on the cause and the severity of the condition. It may be treated with:  A magnesium supplement. This can be taken in pill form. If the condition is severe, magnesium is usually given through an IV.  Changes to your diet. You may be directed to eat foods that have a lot of magnesium, such as green leafy vegetables, peas, beans, and nuts.  Stopping any intake of alcohol. Follow these instructions at home:      Make sure that your diet includes foods with magnesium. Foods that have a lot of magnesium in them include: ? Green leafy vegetables, such as spinach and broccoli. ? Beans and peas. ? Nuts and seeds, such as almonds and sunflower seeds. ? Whole grains, such as whole grain bread and fortified cereals.  Take magnesium supplements if your health care provider tells you to do that. Take them as  directed.  Take over-the-counter and prescription medicines only as told by your health care provider.  Have your magnesium levels monitored as told by your health care provider.  When you are active, drink fluids that contain electrolytes.  Avoid drinking alcohol.  Keep all follow-up visits as told  by your health care provider. This is important. Contact a health care provider if:  You get worse instead of better.  Your symptoms return. Get help right away if you:  Develop severe muscle weakness.  Have trouble breathing.  Feel that your heart is racing. Summary  Hypomagnesemia is a condition in which the level of magnesium in the blood is low.  Hypomagnesemia can affect every organ in the body.  Treatment may include eating more foods that contain magnesium, taking magnesium supplements, and not drinking alcohol.  Have your magnesium levels monitored as told by your health care provider. This information is not intended to replace advice given to you by your health care provider. Make sure you discuss any questions you have with your health care provider. Document Revised: 02/20/2017 Document Reviewed: 02/09/2017 Elsevier Patient Education  [place].   Hypocalcemia, Adult Hypocalcemia is when the level of calcium in a person's blood is below normal. Calcium is a mineral that is used by the body in many ways. Not having enough blood calcium can affect the nervous system. This can lead to problems with muscles, the heart, and the brain. What are the causes? This condition may be caused by:  A deficiency of vitamin D or magnesium or both.  Decreased levels of parathyroid hormone (hypoparathyroidism).  Kidney function problems.  Low levels of a body protein called albumin.  Inflammation of the pancreas (pancreatitis).  Not taking in enough vitamins and minerals in the diet or having intestinal problems that interfere with nutrient absorption.  Certain medicines. What are the signs or symptoms? Some people may not have any symptoms, especially if they have long-term (chronic) hypocalcemia. Symptoms of this condition may include:  Numbness and tingling in the fingers, toes, or around the mouth.  Muscle twitching, aches, or cramps, especially in the legs,  feet, and back.  Spasm of the voice box (laryngospasm). This may make it difficult to breath or speak.  Fast heartbeats (palpitations) and abnormal heart rhythms (arrhythmias).  Shaking uncontrollably (seizures).  Memory problems, confusion, or difficulty thinking.  Depression, anxiety, irritability, or changes in personality. Long-term symptoms of this condition may include:  Coarse, brittle hair and nails.  Dry skin or lasting skin diseases (psoriasis, eczema, or dermatitis).  Dental cavities.  Clouding of the eye lens (cataracts). How is this diagnosed?  This condition is usually diagnosed with a blood test. You may also have other tests to help determine the underlying cause of the condition. This may include more blood tests and imaging tests. How is this treated? This condition may be treated with:  Calcium given by mouth (orally) or given through an IV. The method used for giving calcium will depend on the severity of the condition. If your condition is severe, you may need to be closely monitored in the hospital.  Giving other minerals (electrolytes), such as magnesium. Other treatment will depend on the cause of the condition. Follow these instructions at home:  Follow diet instructions from your health care provider or dietitian.  Take supplements only as told by your health care provider.  Keep all follow-up visits as told by your health care provider. This is important. Contact a health care provider  if you:  Have increased muscle twitching or cramps.  Have new swelling in the feet, ankles, or legs.  Develop changes in mood, memory, or personality. Get help right away if you:  Have chest pain.  Have persistent rapid or irregular heartbeats.  Have difficulty breathing.  Faint.  Start to have seizures.  Have confusion. Summary  Hypocalcemia is when the level of calcium in a person's blood is below normal. Not having enough blood calcium can affect  the nervous system. This can lead to problems with muscles, the heart, and the brain.  This condition may be treated with calcium given by mouth or through an IV, taking other minerals, and treating the underlying cause of hypocalcemia.  Take supplements only as told by your health care provider.  Contact a health care provider if you have new or worsening symptoms.  Keep all follow-up visits as told by your health care provider. This is important. This information is not intended to replace advice given to you by your health care provider. Make sure you discuss any questions you have with your health care provider. Document Revised: 03/19/2018 Document Reviewed: 03/19/2018 Elsevier Patient Education  2020 [place] (COVID-19) Are you at risk?  Are you at risk for the Coronavirus (COVID-19)?  To be considered HIGH RISK for Coronavirus (COVID-19), you have to meet the following criteria:  . Traveled to [place], [place], [place], [place] or [place]; or in the [place] to [place], [place], [place], or [place]; and have fever, cough, and shortness of breath within the last 2 weeks of travel OR . Been in close contact with a person diagnosed with COVID-19 within the last 2 weeks and have fever, cough, and shortness of breath . IF YOU DO NOT MEET THESE CRITERIA, YOU ARE CONSIDERED LOW RISK FOR COVID-19.  What to do if you are HIGH RISK for COVID-19?  Marland Kitchen If you are having a medical emergency, call 911. . Seek medical care right away. Before you go to a doctor's office, urgent care or emergency department, call ahead and tell them about your recent travel, contact with someone diagnosed with COVID-19, and your symptoms. You should receive instructions from your physician's office regarding next steps of care.  . When you arrive at healthcare provider, tell the healthcare staff immediately you have returned from visiting [place], [place], [place], [place] or [place]; or  traveled in the [place] to [place], [place], [place], or [place]; in the last two weeks or you have been in close contact with a person diagnosed with COVID-19 in the last 2 weeks.   . Tell the health care staff about your symptoms: fever, cough and shortness of breath. . After you have been seen by a medical provider, you will be either: o Tested for (COVID-19) and discharged home on quarantine except to seek medical care if symptoms worsen, and asked to  - Stay home and avoid contact with others until you get your results (4-5 days)  - Avoid travel on public transportation if possible (such as bus, train, or airplane) or o Sent to the Emergency Department by EMS for evaluation, COVID-19 testing, and possible admission depending on your condition and test results.  What to do if you are LOW RISK for COVID-19?  Reduce your risk of any infection by using the same precautions used for avoiding the common cold or flu:  Marland Kitchen Wash your hands often with soap and warm water for  at least 20 seconds.  If soap and water are not readily available, use an alcohol-based hand sanitizer with at least 60% alcohol.  . If coughing or sneezing, cover your mouth and nose by coughing or sneezing into the elbow areas of your shirt or coat, into a tissue or into your sleeve (not your hands). . Avoid shaking hands with others and consider head nods or verbal greetings only. . Avoid touching your eyes, nose, or mouth with unwashed hands.  . Avoid close contact with people who are sick. . Avoid places or events with large numbers of people in one location, like concerts or sporting events. . Carefully consider travel plans you have or are making. . If you are planning any travel outside or inside the [place], visit the CDC's Travelers' Health webpage for the latest health notices. . If you have some symptoms but not all symptoms, continue to monitor at home and seek medical attention if your symptoms worsen. . If  you are having a medical emergency, call 911.   [place] / e-Visit: [email or web address]         [facility]: [facility]: [id number]                   [facility]: [phone]

## 2019-05-02 NOTE — Addendum Note (Signed)
Addended by: Truitt Merle on: 05/02/2019 10:35 AM   Modules accepted: Orders

## 2019-05-02 NOTE — Progress Notes (Signed)
[facility] Spiritual Care Note  Followed up with Beverly Schwartz in infusion as planned. She is processing boundaries and expectations in interpersonal relationships, particularly within the context of coping with a life-limiting illness. Diagnosis and prognosis come up intermittently in conversation, but remain too emotionally charged to address head-on for long periods, especially in the less private context of infusion. We plan to follow up in more detail by phone between treatments.   [place], [place], [facility] Pager [phone] Voicemail [phone]

## 2019-05-02 NOTE — Telephone Encounter (Signed)
Scheduled appt per 2/8 los.  Patient will get an updated appt calendar after treatment.

## 2019-05-02 NOTE — Telephone Encounter (Signed)
Oral Oncology Patient Advocate Encounter  After completing a benefits investigation, prior authorization for Xeloda is not required at this time through [facility].  Patient's copay is $0.    [place] Patient Guide [place] Phone [phone] Fax [phone] 05/02/2019 11:59 AM

## 2019-05-04 ENCOUNTER — Telehealth: Payer: Self-pay | Admitting: Pharmacist

## 2019-05-04 DIAGNOSIS — C19 Malignant neoplasm of rectosigmoid junction: Secondary | ICD-10-CM

## 2019-05-04 MED ORDER — CAPECITABINE 500 MG PO TABS: 1000.0000 mg | ORAL_TABLET | Freq: Two times a day (BID) | ORAL | 0 refills | Status: AC

## 2019-05-04 NOTE — Telephone Encounter (Signed)
Oral Oncology Pharmacist Encounter  Received new prescription for Xeloda (capecitabine) for the treatment of metastatic colon cancer in conjunction with irinotecan, planned duration until disease progression or unacceptable drug toxicity. Planned start 05/19/19 (next office visit)  CMP from 05/02/19 assessed, no relevant lab abnormalities. Prescription dose and frequency assessed.   Current medication list in Epic reviewed, no DDIs with capecitabine identified.  Prescription has been e-scribed to the [facility] for benefits analysis and approval.  Oral Oncology Clinic will continue to follow for insurance authorization, copayment issues, initial counseling and start date.  Darl Pikes, PharmD, BCPS, [facility] Hematology/Oncology Clinical Pharmacist [facility] Oral [facility] [phone]  05/04/2019 8:38 AM

## 2019-05-06 NOTE — Telephone Encounter (Signed)
Oral Chemotherapy Pharmacist Encounter  Jensen will deliver medication on 05/10/19. She know the plan is for her to start on 05/19/19 at next office visit.  Patient Education I spoke with patient for overview of new oral chemotherapy medication: Xeloda (capecitabine) for the treatment of metastatic colon cancer in conjunction with irinotecan, planned duration until disease progression or unacceptable drug toxicity.   Counseled patient on administration, dosing, side effects, monitoring, drug-food interactions, safe handling, storage, and disposal. Patient will take 2 tablets (1,000 mg total) by mouth 2 (two) times daily after a meal. Take for 7 days, then hold for 7 days. Repeat every 14 days.  Side effects include but not limited to: diarrhea, hand-foot syndrome, N/V, fatigue, edema.    Ms. Woulard has reported tolerating capecitabine well in the past.  Reviewed with patient importance of keeping a medication schedule and plan for any missed doses.  Ms. Bartosch voiced understanding and appreciation. All questions answered. Medication handout placed in the mail.  Provided patient with Oral [facility] phone number. Patient knows to call the office with questions or concerns. Oral Chemotherapy Navigation Clinic will continue to follow.  Darl Pikes, PharmD, BCPS, [facility] Hematology/Oncology Clinical Pharmacist [facility] Oral [facility] [phone]  05/06/2019 12:05 PM

## 2019-05-06 NOTE — Progress Notes (Signed)
Called patient on 05/06/19 to check-in  about initiating counseling services, but was not able to reach the pt ("person you are trying to reach is not able to take calls at this time"). Counseling intern is sending an e-mail to the pt to ask about scheduling a counseling session next week. If no e-mail response is received, CI will attempt to call pt again on Monday 05/09/19.  Art Buff [place] Counseling Intern Voicemail:  [phone]

## 2019-05-09 MED FILL — CAPECITABINE 500 MG TABLET: 500 | 28 days supply | Qty: 56 | Fill #0

## 2019-05-12 ENCOUNTER — Other Ambulatory Visit: Payer: Self-pay | Admitting: Adult Health

## 2019-05-12 ENCOUNTER — Encounter: Payer: Self-pay | Admitting: General Practice

## 2019-05-12 NOTE — Progress Notes (Signed)
[facility] Spiritual Care Note  Attempted follow-up call, but voicemail is full. Will try again tomorrow.    [place], [place], [facility] Pager [phone] Voicemail [phone]

## 2019-05-13 ENCOUNTER — Encounter: Payer: Self-pay | Admitting: General Practice

## 2019-05-13 NOTE — Progress Notes (Signed)
[facility] Spiritual Care Note  Left voicemail of care and support, encouraging callback when it would be helpful to talk.   [place], [place], [facility] Pager [phone] Voicemail [phone]

## 2019-05-16 ENCOUNTER — Ambulatory Visit: Payer: BC Managed Care – PPO | Admitting: Hematology

## 2019-05-16 ENCOUNTER — Ambulatory Visit: Payer: BC Managed Care – PPO

## 2019-05-16 ENCOUNTER — Other Ambulatory Visit: Payer: BC Managed Care – PPO

## 2019-05-16 NOTE — Progress Notes (Signed)
Spoke to patient on 05/16/19 for a scheduled telehealth intake counseling session via phone. Counseling intern provided therapeutic listening, empathy, and normalization of feelings. Pt stated she had a hard week because she felt bad after chemo for over a week (until yesterday). Pt wondered if "it is worth it" but she feels she "has no choice" because she wants to live. Pt reported experiencing depressive symptoms on a daily basis as evidenced by her down mood and inability to find pleasure in activities. Pt stated she rarely leaves the house except for her chemo tx and is afraid to go out to eat or see friends in person because of risk of Covid-19 exposure. CI and pt discussed how being alone and sick can lead to feelings of depression, but that we can work on strategies to decrease her feelings of depression. CI recommended the pt speak with her PCP about trying an anti-depressant and to mention that she had severe negative side effects with Wellbutrin in the past. Pt reported she does laugh on the phone sometimes with a friend. Pt stated that some good things have happened this week: her son learned to ride his bike, she bought a new refrigerator with her stimulus check, and that her front Archivist is complete. Pt hopes she will sleep better now that the old, loud refrigerator is gone. Pt and CI discussed how the pandemic and her cancer have led to a lot of disappointment including that she can't date and couldn't take a trip to [place] last summer as she planned. CI explained that its OK to continue to hope that some day she will make it to [place] and date again, even if the pt is uncertain what her future holds. Pt stated her goal in counseling is to "get out of her head" and decrease her symptoms of depression.   Next Counseling Appt: Monday, May 23, 2019 at 11:00am via phone  Art Buff [facility] Counseling Intern Voicemail:  [phone]

## 2019-05-18 NOTE — Progress Notes (Signed)
[name]   [phone] [phone]   Clinic Follow up Note   Patient Care Team: Beverly Grandchild, NP as PCP - General (Family Medicine) Beverly Rend, MD as Consulting Physician (Endocrinology) Beverly Merle, MD as Consulting Physician (Hematology) Beverly Feeling, NP as Nurse Practitioner (Nurse Practitioner) Beverly Essex, MD as Consulting Physician (Gastroenterology) 05/19/2019  CHIEF COMPLAINT: F/u metastatic colon cancer  SUMMARY OF ONCOLOGIC HISTORY: Oncology History Overview Note  Cancer Staging Malignant neoplasm of rectosigmoid junction [facility]) Staging form: Colon and Rectum, AJCC 8th Edition - Clinical stage from 06/11/2017: Stage IVA (cTX, cN1b, pM1a) - Signed by Beverly Merle, MD on 06/15/2017     Malignant neoplasm of rectosigmoid junction ([place])  05/30/2017 Imaging   CT AP IMPRESSION: 1. 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 Abdominopelvic adenopathy. Rectosigmoid mass with suggestion of partial obstruction as evidenced by large colonic stool burden more proximally. 2.  Aortic Atherosclerosis (ICD10-I70.0).  This is age advanced.   05/31/2017 Tumor Marker   CEA 353.3 (elevated) AFP 4.5 (normal)   05/31/2017 Initial Biopsy   Diagnosis Colon, biopsy, sigmoid - INVASIVE ADENOCARCINOMA - SEE COMMENT   05/31/2017 Imaging   CT CHEST IMPRESSION: 1. No evidence of metastatic disease in the chest. 2. No acute findings.  Aortic Atherosclerosis (ICD10-I70.0).    05/31/2017 Procedure   Colonoscopy per Dr. Watt Schwartz Findings: An infiltrative and ulcerated partially obstructing medium-sized mass was found in the recto-sigmoid colon. The mass was circumferential. The mass measured four cm in length. No bleeding was present.   Impression - One small polyp in the rectum. - The examination was otherwise normal. - Malignant partially obstructing tumor in the recto-sigmoid colon.  Biopsied. - One medium polyp in the proximal sigmoid colon. - The examination was otherwise normal. - Internal hemorrhoids.   06/03/2017 Initial Diagnosis   Malignant neoplasm of transverse colon ([place])   06/11/2017 Cancer Staging   Staging form: Colon and Rectum, AJCC 8th Edition - Clinical stage from 06/11/2017: Stage IVA (cTX, cN1b, pM1a) - Signed by Beverly Merle, MD on 06/15/2017   06/11/2017 Pathology Results   Liver biopsy confirmed metastatic colon cancer   07/23/2017 Imaging   CT CAP IMPRESSION: 1. Mild progression of hepatic metastasis. 2. Similar rectosigmoid primary with abdominopelvic nodal metastasis. 3.  No acute process or evidence of metastatic disease in the chest. 4. Aortic Atherosclerosis (ICD10-I70.0). Coronary artery atherosclerosis. This is age advanced. 5. Proximal colonic constipation, again suggesting a component of partial obstruction at the primary site. 6. Mild ascending aortic dilatation at 4.1 cm.   08/25/2017 - 05/10/2018 Chemotherapy   -Xeloda '1500mg'$  BID 1 week on and 1 week off starting 08/25/17. Due to her worsening hand-foot syndrome her dose was reduced to '1500mg'$  in the AM and '1000mg'$  in the PM. Due to disease progression we swithced her to CAPOX  -Vectibix every 2 weeks starting 08/25/17. Due to skin rash will stop starting 05/10/18.     12/14/2017 Imaging   IMPRESSION: 1. Response to therapy. Marked decrease in hepatic metastatic burden. More mild decrease in abdominopelvic adenopathy and definition of rectosigmoid primary. 2.  No acute process or evidence of metastatic disease in the chest. 3.  Aortic Atherosclerosis (ICD10-I70.0).    04/28/2018 Imaging   CT CAP 04/28/18  IMPRESSION: 1. Index liver metastases measured on the previous study show no substantial interval change although new liver lesions on today's exam are concerning for progressive disease. 2. Mild lymphadenopathy in the upper abdomen is  stable. 3. Persistent mild ill-defined wall thickening  in the distal sigmoid colon near the rectosigmoid junction. 4.  Aortic Atherosclerois (ICD10-170.0)    05/17/2018 - 12/02/2018 Chemotherapy   CAPOX every 3 weeks with Xeloda '1500mg'$  BID 2 weeks on/1week off starting 05/17/18 -Add Avastin to first cycle.    09/09/2018 Imaging   CT CAP W Contrast IMPRESSION: 1. Slight interval decrease in size one of the right hepatic lobe lesions. Additional lesions within the liver are similar when compared to recent prior exam. 2. Mild adenopathy within the abdomen is stable. 3. Similar-appearing thick walled distal sigmoid colon.   12/20/2018 Imaging   CT CAP W Contrast  IMPRESSION: 1. Increase in size and number of hepatic metastatic lesions compared to the prior study. 2. Stable adenopathy in the abdomen. 3. Signs of sigmoid wall thickening as before.   01/04/2019 - 01/28/2019 Chemotherapy   Irinotecan and Avastin q2weeks starting 01/04/19.  Stoppes due to disease progression.    03/08/2019 Imaging   CT CAP W Contrast  IMPRESSION: 1. Interval progression of liver metastasis. 2. Stable borderline enlarged upper abdominal lymph node. 3. Aortic atherosclerosis.   Aortic Atherosclerosis (ICD10-I70.0).   04/04/2019 -  Chemotherapy   FOLFIRI and Vectibix q2weeks starting 04/04/19     CURRENT THERAPY:  Irinotecan and Vectibix q2weeks starting 04/04/19. 50% 5FU with C2 only. Will add Xeloda '1000mg'$  BID 1 week on/1 week off starting with C4.  INTERVAL HISTORY: Beverly Schwartz returns for f/u and treatment as scheduled. She completed cycle 3 irinotecan and vectibix on 05/02/19. She felt sick with with nausea, decreased po and weight loss, and diarrhea 2-3 times daily for about 6-7 days after last treatment. She did not take imodium or lomotil. She recovered this week. She has dry skin and a non-pruritic rash to chest, neck, and face. She uses Cera-v lotion and clindagel. Denies mouth sores, but has occasional raspy throat. Denies fever, chills, cough, chest  pain, dyspnea. Has occasional right side abd pain when she lays down, no new pain. Her is less anxious now that she lives on her mother's property. Notes her PCP just left practice.    MEDICAL HISTORY:  Past Medical History:  Diagnosis Date  . Anxiety   . Cancer ([place])    colon, liver  . Chest pain 10/18/2018   Patient c/o chest pain right side yesterday intermitten lasting 3-4 hours.  [name], PA notified  . Colon cancer ([place])   . Complication of anesthesia   . Depression   . Diabetes mellitus   . Dizziness   . Family history of breast cancer   . Family history of stomach cancer   . Hyperlipidemia   . Hypertension   . Hypothyroidism   . Obesity   . PONV (postoperative nausea and vomiting)   . Tachycardia     SURGICAL HISTORY: Past Surgical History:  Procedure Laterality Date  . FLEXIBLE SIGMOIDOSCOPY N/A 05/31/2017   Procedure: FLEXIBLE SIGMOIDOSCOPY;  Surgeon: Beverly Essex, MD;  Location: [facility];  Service: Endoscopy;  Laterality: N/A;  . IR IMAGING GUIDED PORT INSERTION  10/19/2018  . WISDOM TOOTH EXTRACTION     age 65's    I have reviewed the social history and family history with the patient and they are unchanged from previous note.  ALLERGIES:  is allergic to bupropion and amoxicillin.  MEDICATIONS:  Current Outpatient Medications  Medication Sig Dispense Refill  . acetaminophen-codeine (TYLENOL #3) 300-30 MG tablet Take 1 tablet by mouth every 6 (six) hours as  needed for moderate pain. 30 tablet 0  . ALPRAZolam (XANAX) 1 MG tablet TAKE 1 TABLET BY MOUTH AT BEDTIME AS NEEDED FOR ANXIETY (Patient taking differently: Take 1 mg by mouth at bedtime as needed for anxiety. ) 30 tablet 0  . benzonatate (TESSALON) 100 MG capsule Take 1 capsule (100 mg total) by mouth 3 (three) times daily as needed for cough. 30 capsule 0  . capecitabine (XELODA) 500 MG tablet Take 2 tablets (1,000 mg total) by mouth 2 (two) times daily after a meal. Take for 7 days, then hold for 7  days. Repeat every 14 days. 56 tablet 0  . clindamycin (CLINDAGEL) 1 % gel Apply topically 2 (two) times daily. 30 g 2  . fluticasone (FLONASE) 50 MCG/ACT nasal spray Place 1 spray into both nostrils daily. (Patient taking differently: Place 1 spray into both nostrils daily as needed for allergies. ) 16 g 2  . insulin NPH-regular Human (NOVOLIN 70/30) (70-30) 100 UNIT/ML injection INJECT 10 UNITS WITH BREAKFAST, 10 UNITS WITH DINNER (Patient taking differently: Inject 10 Units into the skin 2 (two) times daily with a meal. ) 10 mL 2  . Insulin Syringe-Needle U-100 (BD INSULIN SYRINGE U/F) 31G X 5/16" 1 ML MISC Inject twice a day as directed 200 each 3  . levothyroxine (SYNTHROID) 112 MCG tablet TAKE 1 TABLET BY MOUTH DAILY BEFORE BREAKFAST. LABS REQUIRED PRIOR TO ANY FURTHER REFILLS. 30 tablet 0  . lidocaine-prilocaine (EMLA) cream Apply 1 application topically as needed. 30 g 1  . lisinopril (ZESTRIL) 5 MG tablet TAKE 1 TABLET BY MOUTH EVERY DAY 90 tablet 0  . magnesium oxide (MAG-OX) 400 (241.3 Mg) MG tablet Take 1 tablet (400 mg total) by mouth daily. (Patient taking differently: Take 400 mg by mouth in the morning, at noon, and at bedtime. ) 90 tablet 1  . magnesium oxide (MAG-OX) 400 MG tablet Take 400 mg by mouth 2 (two) times daily.    Marland Kitchen albuterol (PROVENTIL HFA;VENTOLIN HFA) 108 (90 Base) MCG/ACT inhaler Inhale 2 puffs into the lungs every 6 (six) hours as needed for wheezing or shortness of breath. 1 Inhaler 0  . amLODipine (NORVASC) 10 MG tablet Take 1 tablet (10 mg total) by mouth daily. 30 tablet 0  . carbamide peroxide (DEBROX) 6.5 % OTIC solution Place 5 drops into both ears 2 (two) times daily. 15 mL 0  . CINNAMON PO Take 1 tablet by mouth daily.     . mometasone (ELOCON) 0.1 % cream Apply 1 application topically daily as needed.    . Omega-3 Fatty Acids (FISH OIL) 1000 MG CAPS Take 1,000 mg by mouth daily.     . polyethylene glycol (MIRALAX / GLYCOLAX) packet Take 17 g by mouth daily.  14 each 0  . TURMERIC PO Take 1 capsule by mouth daily.      Current Facility-Administered Medications  Medication Dose Route Frequency Provider Last Rate Last Admin  . 0.9 %  sodium chloride infusion   Intravenous Once Beverly Feeling, NP       Facility-Administered Medications Ordered in Other Visits  Medication Dose Route Frequency Provider Last Rate Last Admin  . 0.9 %  sodium chloride infusion   Intravenous Continuous Beverly Merle, MD   Stopped at 11/11/18 1330    PHYSICAL EXAMINATION: ECOG PERFORMANCE STATUS: 1 - Symptomatic but completely ambulatory  Vitals:   05/19/19 1245  BP: 127/85  Pulse: (!) 127  Resp: 18  Temp: 98 F (36.7 C)  SpO2: 100%  Filed Weights   05/19/19 1245  Weight: 149 lb 8 oz (67.8 kg)    GENERAL:alert, no distress and comfortable SKIN: generalized dry skin with scattered acne type rash to chest, neck (moderate), and face (mild)   EYES: sclera clear OROPHARYNX: no thrush or ulcers LUNGS: clear with normal breathing effort HEART: regular rate & rhythm, trace lower extremity edema ABDOMEN: abdomen soft, non-tender and normal bowel sounds NEURO: alert & oriented x 3 with fluent speech, normal gait PAC without erythema   LABORATORY DATA:  I have reviewed the data as listed CBC Latest Ref Rng & Units 05/19/2019 05/02/2019 04/19/2019  WBC 4.0 - 10.5 K/uL 11.4(H) 5.3 5.1  Hemoglobin 12.0 - 15.0 g/dL 12.7 10.9(L) 11.1(L)  Hematocrit 36.0 - 46.0 % 41.2 35.5(L) 35.5(L)  Platelets 150 - 400 K/uL 313 265 238     CMP Latest Ref Rng & Units 05/19/2019 05/02/2019 04/19/2019  Glucose 70 - 99 mg/dL 162(H) 133(H) 127(H)  BUN 6 - 20 mg/dL 10 6 5(L)  Creatinine 0.44 - 1.00 mg/dL 0.73 0.65 0.62  Sodium 135 - 145 mmol/L 142 143 141  Potassium 3.5 - 5.1 mmol/L 3.8 3.8 3.8  Chloride 98 - 111 mmol/L 103 108 107  CO2 22 - 32 mmol/L '27 25 24  '$ Calcium 8.9 - 10.3 mg/dL 6.8(L) 6.3(LL) 7.1(L)  Total Protein 6.5 - 8.1 g/dL 7.3 6.5 6.6  Total Bilirubin 0.3 - 1.2 mg/dL 0.6  0.3 0.3  Alkaline Phos 38 - 126 U/L 296(H) 288(H) 273(H)  AST 15 - 41 U/L 32 31 35  ALT 0 - 44 U/L '17 18 19      '$ RADIOGRAPHIC STUDIES: I have personally reviewed the radiological images as listed and agreed with the findings in the report. No results found.   ASSESSMENT & PLAN: Beverly Schwartz is a 49 y.o. female with   1. Sigmoid adenocarcinoma, with abdominopelvic adenopathy andhepatic metastasis, stage IV, MSI-stable , KRAS/NRAS/BRAF wild type -Diagnosed in 05/2017. Patient declined intensive chemo regiments previously due to fear of side effects.She progressed on first lineXelodaandVectibixafter 8 months of therapy. She progressed on second line CAPOX and avastin, she did not tolerate well due to hand/foot syndrome and neuropathy.  -She had PAC placed on 10/19/18. -She started third line irinotecan and avastin on 10/13, patient declined starting 5FU while she moved into new house on her mother's land. She received 2 doses chemo before restaging on 03/08/19 showed interval progression of liver mets and stable to borderline abd LNs -She was recently hospitalized for syncope, confusion, n/v/ and pain. CT on 03/27/19 showed progression of liver metastasis and new and progressive adenopathy, CEA is markedly elevated to 1277 which is consistent with progression.  -She started FOLFIRI with 5FU pump over 24 hours (1/2 dose) but did not tolerate well due to fatigue and nausea and did not want to retry -Ms. Fagerstrom appears stable today. She has completed 3 cycles irinotecan and panitumumab. She had nausea, decreased po intake with weight loss, and diarrhea for a week. She finally recovered this week and feels better. She did not take imodium. She has mild to moderate skin rash, mostly on her chest -CBC and CMP are stable. Mg 1.5, I recommend to increase mag-ox to TID. Ca 6.8, I recommend daily calcium supplement. She agrees. CEA has improved on this regimen. Plan to restage in 06/2019 -Due to  her her diarrhea and mild dehydration today (HR 125) I recommend to add 500 cc NS with her treatment, her irinotecan will be  dose reduced to 140 mg/m2. I strongly encouraged her to use imodium PRN and continue to hydrate well. -For better disease control, Dr. Burr Medico has recommended low dose Xeloda 1000 mg BID for 1 week on and 1 week off. We reviewed potential side effects including diarrhea, mucositis, hand/foot syndrome. We reviewed skin care for Xeloda and vectibix rash. She declined doxycycline. I encouraged her to continue clindagel and hydrocoritisone and thick lotions.  -She knows to call clinic if she has problems in the interim. -Return for f/u and next cycle in 2 weeks   2. DM, HTN, HL -HTN improved after she came off Avastin. Now only taking lisinopril -BP 127/85 today  3. Financial and Social issues, Anxiety, depression -followed by SW and Chaplain -She is followed by mental health provider Art Buff -On Xanax PRN which is helpful, she is requiring less now that she lives close to her mother  Nunzio Cory regained insurancewithBlue cross insurancein 2020  4.Goal of care discussion -She understands the goal of care is palliative, her cancer is incurable. The goal of therapy is to prolong her life and improve her quality of life. -She is full code for now   5.Insomnia, anxiety  -improved overall   6. 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  -not discussed today  7. Skin acne rash, secondary to Vectibix  -mild on her face and neck, moderate on chest  -She has OTC hydrocortisone cream and Clindamycin gel which I refilled today -I encouraged her to use thick lotion on her hands once she starts Xeloda and monitor for hand/foot syndrome   8. Iron deficiency  -Ferritin 106 on 05/02/19, she is  not on oral iron. I encouraged her to take 1 tab MWF if she can tolerate.   PLAN: -Labs reviewed -Proceed with cycle 4 irinotecan (dose reduced to 140 mg/m2 for diarrhea) and panitumumab  -Begin Xeloda 1000 mg BID for 1 week on and 1 week off  -Continue skin regimen, refilled clindagel -Increase Mag-ox to TID, start calcium supplement, and take oral iron MWF if tolerated   -500 cc NS with treatment today  -Use imodium PRN for diarrhea  -f/u and cycle 5 in 2 weeks   No problem-specific Assessment & Plan notes found for this encounter.   No orders of the defined types were placed in this encounter.  All questions were answered. The patient knows to call the clinic with any problems, questions or concerns. No barriers to learning was detected. I spent 15 minutes counseling the patient face to face. The total time spent in the appointment was 25 minutes and more than 50% was on counseling, review of test results, and coordination of care.      Beverly Feeling, NP 05/19/19

## 2019-05-19 ENCOUNTER — Inpatient Hospital Stay: Payer: BC Managed Care – PPO

## 2019-05-19 ENCOUNTER — Encounter: Payer: Self-pay | Admitting: Nurse Practitioner

## 2019-05-19 ENCOUNTER — Inpatient Hospital Stay (HOSPITAL_BASED_OUTPATIENT_CLINIC_OR_DEPARTMENT_OTHER): Payer: BC Managed Care – PPO | Admitting: Nurse Practitioner

## 2019-05-19 ENCOUNTER — Encounter: Payer: Self-pay | Admitting: General Practice

## 2019-05-19 ENCOUNTER — Other Ambulatory Visit: Payer: Self-pay

## 2019-05-19 VITALS — BP 127/85 | HR 127 | Temp 98.0°F | Resp 18 | Ht 65.0 in | Wt 149.5 lb

## 2019-05-19 VITALS — HR 118

## 2019-05-19 DIAGNOSIS — C19 Malignant neoplasm of rectosigmoid junction: Secondary | ICD-10-CM

## 2019-05-19 DIAGNOSIS — Z95828 Presence of other vascular implants and grafts: Secondary | ICD-10-CM

## 2019-05-19 DIAGNOSIS — D500 Iron deficiency anemia secondary to blood loss (chronic): Secondary | ICD-10-CM

## 2019-05-19 DIAGNOSIS — C187 Malignant neoplasm of sigmoid colon: Secondary | ICD-10-CM

## 2019-05-19 DIAGNOSIS — Z7189 Other specified counseling: Secondary | ICD-10-CM

## 2019-05-19 LAB — CMP (CANCER CENTER ONLY)
ALT: 17 U/L (ref 0–44)
AST: 32 U/L (ref 15–41)
Albumin: 3 g/dL — ABNORMAL LOW (ref 3.5–5.0)
Alkaline Phosphatase: 296 U/L — ABNORMAL HIGH (ref 38–126)
Anion gap: 12 (ref 5–15)
BUN: 10 mg/dL (ref 6–20)
CO2: 27 mmol/L (ref 22–32)
Calcium: 6.8 mg/dL — ABNORMAL LOW (ref 8.9–10.3)
Chloride: 103 mmol/L (ref 98–111)
Creatinine: 0.73 mg/dL (ref 0.44–1.00)
GFR, Est AFR Am: >60 mL/min (ref >60)
GFR, Estimated: >60 mL/min (ref >60)
Glucose, Bld: 162 mg/dL — ABNORMAL HIGH (ref 70–99)
Potassium: 3.8 mmol/L (ref 3.5–5.1)
Sodium: 142 mmol/L (ref 135–145)
Total Bilirubin: 0.6 mg/dL (ref 0.3–1.2)
Total Protein: 7.3 g/dL (ref 6.5–8.1)

## 2019-05-19 LAB — CBC WITH DIFFERENTIAL (CANCER CENTER ONLY)
Abs Immature Granulocytes: 0.11 10*3/uL — ABNORMAL HIGH (ref 0.00–0.07)
Basophils Absolute: 0.1 10*3/uL (ref 0.0–0.1)
Basophils Relative: 1 %
Eosinophils Absolute: 0.2 10*3/uL (ref 0.0–0.5)
Eosinophils Relative: 2 %
HCT: 41.2 % (ref 36.0–46.0)
Hemoglobin: 12.7 g/dL (ref 12.0–15.0)
Immature Granulocytes: 1 %
Lymphocytes Relative: 19 %
Lymphs Abs: 2.2 10*3/uL (ref 0.7–4.0)
MCH: 26.7 pg (ref 26.0–34.0)
MCHC: 30.8 g/dL (ref 30.0–36.0)
MCV: 86.6 fL (ref 80.0–100.0)
Monocytes Absolute: 1.1 10*3/uL — ABNORMAL HIGH (ref 0.1–1.0)
Monocytes Relative: 10 %
Neutro Abs: 7.6 10*3/uL (ref 1.7–7.7)
Neutrophils Relative %: 67 %
Platelet Count: 313 10*3/uL (ref 150–400)
RBC: 4.76 MIL/uL (ref 3.87–5.11)
RDW: 16.6 % — ABNORMAL HIGH (ref 11.5–15.5)
WBC Count: 11.4 10*3/uL — ABNORMAL HIGH (ref 4.0–10.5)
nRBC: 0 % (ref 0.0–0.2)

## 2019-05-19 LAB — MAGNESIUM: Magnesium: 1.5 mg/dL — ABNORMAL LOW (ref 1.7–2.4)

## 2019-05-19 MED ORDER — SODIUM CHLORIDE 0.9 % IV SOLN
Freq: Once | INTRAVENOUS | Status: AC
  Filled 2019-05-19: qty 250

## 2019-05-19 MED ORDER — ALTEPLASE 2 MG IJ SOLR
2.0000 mg | Freq: Once | INTRAMUSCULAR | Status: AC
  Administered 2019-05-19: 2 mg
  Filled 2019-05-19: qty 2

## 2019-05-19 MED ORDER — CLINDAMYCIN PHOSPHATE 1 % EX GEL: Freq: Two times a day (BID) | CUTANEOUS | 2 refills | Status: AC

## 2019-05-19 MED ORDER — SODIUM CHLORIDE 0.9% FLUSH
10.0000 mL | Freq: Once | INTRAVENOUS | Status: AC
  Administered 2019-05-19: 10 mL
  Filled 2019-05-19: qty 10

## 2019-05-19 MED ORDER — DEXAMETHASONE SODIUM PHOSPHATE 10 MG/ML IJ SOLN
10.0000 mg | Freq: Once | INTRAMUSCULAR | Status: AC
  Administered 2019-05-19: 10 mg via INTRAVENOUS

## 2019-05-19 MED ORDER — DEXAMETHASONE SODIUM PHOSPHATE 10 MG/ML IJ SOLN
INTRAMUSCULAR | Status: AC
  Filled 2019-05-19: qty 1

## 2019-05-19 MED ORDER — HEPARIN SOD (PORK) LOCK FLUSH 100 UNIT/ML IV SOLN
500.0000 [IU] | Freq: Once | INTRAVENOUS | Status: AC | PRN
  Administered 2019-05-19: 500 [IU]
  Filled 2019-05-19: qty 5

## 2019-05-19 MED ORDER — SODIUM CHLORIDE 0.9 % IV SOLN
5.5000 mg/kg | Freq: Once | INTRAVENOUS | Status: AC
  Administered 2019-05-19: 400 mg via INTRAVENOUS
  Filled 2019-05-19: qty 20

## 2019-05-19 MED ORDER — SODIUM CHLORIDE 0.9% FLUSH
10.0000 mL | INTRAVENOUS | Status: DC | PRN
  Administered 2019-05-19: 10 mL
  Filled 2019-05-19: qty 10

## 2019-05-19 MED ORDER — ALTEPLASE 2 MG IJ SOLR
INTRAMUSCULAR | Status: AC
  Filled 2019-05-19: qty 2

## 2019-05-19 MED ORDER — ATROPINE SULFATE 1 MG/ML IJ SOLN
0.5000 mg | Freq: Once | INTRAMUSCULAR | Status: AC | PRN
  Administered 2019-05-19: 0.5 mg via INTRAVENOUS

## 2019-05-19 MED ORDER — ATROPINE SULFATE 1 MG/ML IJ SOLN
INTRAMUSCULAR | Status: AC
  Filled 2019-05-19: qty 1

## 2019-05-19 MED ORDER — PALONOSETRON HCL INJECTION 0.25 MG/5ML
0.2500 mg | Freq: Once | INTRAVENOUS | Status: AC
  Administered 2019-05-19: 0.25 mg via INTRAVENOUS

## 2019-05-19 MED ORDER — SODIUM CHLORIDE 0.9 % IV SOLN
140.0000 mg/m2 | Freq: Once | INTRAVENOUS | Status: AC
  Administered 2019-05-19: 260 mg via INTRAVENOUS
  Filled 2019-05-19: qty 13

## 2019-05-19 MED ORDER — PALONOSETRON HCL INJECTION 0.25 MG/5ML
INTRAVENOUS | Status: AC
  Filled 2019-05-19: qty 5

## 2019-05-19 NOTE — Patient Instructions (Addendum)
[facility] Discharge Instructions for Patients Receiving Chemotherapy  Today you received the following Immunotherapy agent: Panitumumab (Vectibix) and Chemotherapy agent: Irinotecan  To help prevent nausea and vomiting after your treatment, we encourage you to take your nausea medication as directed by your MD.   If you develop nausea and vomiting that is not controlled by your nausea medication, call the clinic.   BELOW ARE SYMPTOMS THAT SHOULD BE REPORTED IMMEDIATELY:  *FEVER GREATER THAN 100.5 F  *CHILLS WITH OR WITHOUT FEVER  NAUSEA AND VOMITING THAT IS NOT CONTROLLED WITH YOUR NAUSEA MEDICATION  *UNUSUAL SHORTNESS OF BREATH  *UNUSUAL BRUISING OR BLEEDING  TENDERNESS IN MOUTH AND THROAT WITH OR WITHOUT PRESENCE OF ULCERS  *URINARY PROBLEMS  *BOWEL PROBLEMS  UNUSUAL RASH Items with * indicate a potential emergency and should be followed up as soon as possible.  Feel free to call the clinic should you have any questions or concerns. The clinic phone number is [phone].  Please show the [facility] at check-in to the Emergency Department and triage nurse.  **Increase Magnesium to three times per day per Cira Rue NP  Hypomagnesemia Hypomagnesemia is a condition in which the level of magnesium in the blood is low. Magnesium is a mineral that is found in many foods. It is used in many different processes in the body. Hypomagnesemia can affect every organ in the body. In severe cases, it can cause life-threatening problems. What are the causes? This condition may be caused by:  Not getting enough magnesium in your diet.  Malnutrition.  Problems with absorbing magnesium from the intestines.  Dehydration.  Alcohol abuse.  Vomiting.  Severe or chronic diarrhea.  Some medicines, including medicines that make you urinate more (diuretics).  Certain diseases, such as kidney disease, diabetes, celiac disease, and overactive thyroid. What  are the signs or symptoms? Symptoms of this condition include:  Loss of appetite.  Nausea and vomiting.  Involuntary shaking or trembling of a body part (tremor).  Muscle weakness.  Tingling in the arms and legs.  Sudden tightening of muscles (muscle spasms).  Confusion.  Psychiatric issues, such as depression, irritability, or psychosis.  A feeling of fluttering of the heart.  Seizures. These symptoms are more severe if magnesium levels drop suddenly. How is this diagnosed? This condition may be diagnosed based on:  Your symptoms and medical history.  A physical exam.  Blood and urine tests. How is this treated? Treatment depends on the cause and the severity of the condition. It may be treated with:  A magnesium supplement. This can be taken in pill form. If the condition is severe, magnesium is usually given through an IV.  Changes to your diet. You may be directed to eat foods that have a lot of magnesium, such as green leafy vegetables, peas, beans, and nuts.  Stopping any intake of alcohol. Follow these instructions at home:      Make sure that your diet includes foods with magnesium. Foods that have a lot of magnesium in them include: ? Green leafy vegetables, such as spinach and broccoli. ? Beans and peas. ? Nuts and seeds, such as almonds and sunflower seeds. ? Whole grains, such as whole grain bread and fortified cereals.  Take magnesium supplements if your health care provider tells you to do that. Take them as directed.  Take over-the-counter and prescription medicines only as told by your health care provider.  Have your magnesium levels monitored as told by your health care  provider.  When you are active, drink fluids that contain electrolytes.  Avoid drinking alcohol.  Keep all follow-up visits as told by your health care provider. This is important. Contact a health care provider if:  You get worse instead of better.  Your symptoms  return. Get help right away if you:  Develop severe muscle weakness.  Have trouble breathing.  Feel that your heart is racing. Summary  Hypomagnesemia is a condition in which the level of magnesium in the blood is low.  Hypomagnesemia can affect every organ in the body.  Treatment may include eating more foods that contain magnesium, taking magnesium supplements, and not drinking alcohol.  Have your magnesium levels monitored as told by your health care provider. This information is not intended to replace advice given to you by your health care provider. Make sure you discuss any questions you have with your health care provider. Document Revised: 02/20/2017 Document Reviewed: 02/09/2017 Elsevier Patient Education  [place].    Dehydration, Adult Dehydration is condition in which there is not enough water or other fluids in the body. This happens when a person loses more fluids than he or she takes in. Important body parts cannot work right without the right amount of fluids. Any loss of fluids from the body can cause dehydration. Dehydration can be mild, worse, or very bad. It should be treated right away to keep it from getting very bad. What are the causes? This condition may be caused by:  Conditions that cause loss of water or other fluids, such as: ? Watery poop (diarrhea). ? Vomiting. ? Sweating a lot. ? Peeing (urinating) a lot.  Not drinking enough fluids, especially when you: ? Are ill. ? Are doing things that take a lot of energy to do.  Other illnesses and conditions, such as fever or infection.  Certain medicines, such as medicines that take extra fluid out of the body (diuretics).  Lack of safe drinking water.  Not being able to get enough water and food. What increases the risk? The following factors may make you more likely to develop this condition:  Having a long-term (chronic) illness that has not been treated the right way, such  as: ? Diabetes. ? Heart disease. ? Kidney disease.  Being 48 years of age or older.  Having a disability.  Living in a place that is high above the ground or sea (high in altitude). The thinner, dried air causes more fluid loss.  Doing exercises that put stress on your body for a long time. What are the signs or symptoms? Symptoms of dehydration depend on how bad it is. Mild or worse dehydration  Thirst.  Dry lips or dry mouth.  Feeling dizzy or light-headed, especially when you stand up from sitting.  Muscle cramps.  Your body making: ? Dark pee (urine). Pee may be the color of tea. ? Less pee than normal. ? Less tears than normal.  Headache. Very bad dehydration  Changes in skin. Skin may: ? Be cold to the touch (clammy). ? Be blotchy or pale. ? Not go back to normal right after you lightly pinch it and let it go.  Little or no tears, pee, or sweat.  Changes in vital signs, such as: ? Fast breathing. ? Low blood pressure. ? Weak pulse. ? Pulse that is more than 100 beats a minute when you are sitting still.  Other changes, such as: ? Feeling very thirsty. ? Eyes that look hollow (sunken). ? Cold  hands and feet. ? Being mixed up (confused). ? Being very tired (lethargic) or having trouble waking from sleep. ? Short-term weight loss. ? Loss of consciousness. How is this treated? Treatment for this condition depends on how bad it is. Treatment should start right away. Do not wait until your condition gets very bad. Very bad dehydration is an emergency. You will need to go to a hospital.  Mild or worse dehydration can be treated at home. You may be asked to: ? Drink more fluids. ? Drink an oral rehydration solution (ORS). This drink helps get the right amounts of fluids and salts and minerals in the blood (electrolytes).  Very bad dehydration can be treated: ? With fluids through an IV tube. ? By getting normal levels of salts and minerals in your blood.  This is often done by giving salts and minerals through a tube. The tube is passed through your nose and into your stomach. ? By treating the root cause. Follow these instructions at home: Oral rehydration solution If told by your doctor, drink an ORS:  Make an ORS. Use instructions on the package.  Start by drinking small amounts, about  cup (120 mL) every 5-10 minutes.  Slowly drink more until you have had the amount that your doctor said to have. Eating and drinking         Drink enough clear fluid to keep your pee pale yellow. If you were told to drink an ORS, finish the ORS first. Then, start slowly drinking other clear fluids. Drink fluids such as: ? Water. Do not drink only water. Doing that can make the salt (sodium) level in your body get too low. ? Water from ice chips you suck on. ? Fruit juice that you have added water to (diluted). ? Low-calorie sports drinks.  Eat foods that have the right amounts of salts and minerals, such as: ? Bananas. ? Oranges. ? Potatoes. ? Tomatoes. ? Spinach.  Do not drink alcohol.  Avoid: ? Drinks that have a lot of sugar. These include:  High-calorie sports drinks.  Fruit juice that you did not add water to.  Soda.  Caffeine. ? Foods that are greasy or have a lot of fat or sugar. General instructions  Take over-the-counter and prescription medicines only as told by your doctor.  Do not take salt tablets. Doing that can make the salt level in your body get too high.  Return to your normal activities as told by your doctor. Ask your doctor what activities are safe for you.  Keep all follow-up visits as told by your doctor. This is important. Contact a doctor if:  You have pain in your belly (abdomen) and the pain: ? Gets worse. ? Stays in one place.  You have a rash.  You have a stiff neck.  You get angry or annoyed (irritable) more easily than normal.  You are more tired or have a harder time waking than  normal.  You feel: ? Weak or dizzy. ? Very thirsty. Get help right away if you have:  Any symptoms of very bad dehydration.  Symptoms of vomiting, such as: ? You cannot eat or drink without vomiting. ? Your vomiting gets worse or does not go away. ? Your vomit has blood or green stuff in it.  Symptoms that get worse with treatment.  A fever.  A very bad headache.  Problems with peeing or pooping (having a bowel movement), such as: ? Watery poop that gets worse or does  not go away. ? Blood in your poop (stool). This may cause poop to look black and tarry. ? Not peeing in 6-8 hours. ? Peeing only a small amount of very dark pee in 6-8 hours.  Trouble breathing. These symptoms may be an emergency. Do not wait to see if the symptoms will go away. Get medical help right away. Call your local emergency services (911 in the [place] Do not drive yourself to the hospital. Summary  Dehydration is a condition in which there is not enough water or other fluids in the body. This happens when a person loses more fluids than he or she takes in.  Treatment for this condition depends on how bad it is. Treatment should be started right away. Do not wait until your condition gets very bad.  Drink enough clear fluid to keep your pee pale yellow. If you were told to drink an oral rehydration solution (ORS), finish the ORS first. Then, start slowly drinking other clear fluids.  Take over-the-counter and prescription medicines only as told by your doctor.  Get help right away if you have any symptoms of very bad dehydration. This information is not intended to replace advice given to you by your health care provider. Make sure you discuss any questions you have with your health care provider. Document Revised: 10/21/2018 Document Reviewed: 10/21/2018 Elsevier Patient Education  2020 [place] (COVID-19) Are you at risk?  Are you at risk for the Coronavirus (COVID-19)?  To  be considered HIGH RISK for Coronavirus (COVID-19), you have to meet the following criteria:  . Traveled to [place], [place], [place], [place] or [place]; or in the [place] to [place], [place], [place], or [place]; and have fever, cough, and shortness of breath within the last 2 weeks of travel OR . Been in close contact with a person diagnosed with COVID-19 within the last 2 weeks and have fever, cough, and shortness of breath . IF YOU DO NOT MEET THESE CRITERIA, YOU ARE CONSIDERED LOW RISK FOR COVID-19.  What to do if you are HIGH RISK for COVID-19?  Marland Kitchen If you are having a medical emergency, call 911. . Seek medical care right away. Before you go to a doctor's office, urgent care or emergency department, call ahead and tell them about your recent travel, contact with someone diagnosed with COVID-19, and your symptoms. You should receive instructions from your physician's office regarding next steps of care.  . When you arrive at healthcare provider, tell the healthcare staff immediately you have returned from visiting [place], [place], [place], [place] or [place]; or traveled in the [place] to [place], [place], [place], or [place]; in the last two weeks or you have been in close contact with a person diagnosed with COVID-19 in the last 2 weeks.   . Tell the health care staff about your symptoms: fever, cough and shortness of breath. . After you have been seen by a medical provider, you will be either: o Tested for (COVID-19) and discharged home on quarantine except to seek medical care if symptoms worsen, and asked to  - Stay home and avoid contact with others until you get your results (4-5 days)  - Avoid travel on public transportation if possible (such as bus, train, or airplane) or o Sent to the Emergency Department by EMS for evaluation, COVID-19 testing, and possible admission depending on your condition and test results.  What to do if you are LOW RISK  for  COVID-19?  Reduce your risk of any infection by using the same precautions used for avoiding the common cold or flu:  Marland Kitchen Wash your hands often with soap and warm water for at least 20 seconds.  If soap and water are not readily available, use an alcohol-based hand sanitizer with at least 60% alcohol.  . If coughing or sneezing, cover your mouth and nose by coughing or sneezing into the elbow areas of your shirt or coat, into a tissue or into your sleeve (not your hands). . Avoid shaking hands with others and consider head nods or verbal greetings only. . Avoid touching your eyes, nose, or mouth with unwashed hands.  . Avoid close contact with people who are sick. . Avoid places or events with large numbers of people in one location, like concerts or sporting events. . Carefully consider travel plans you have or are making. . If you are planning any travel outside or inside the [place], visit the CDC's Travelers' Health webpage for the latest health notices. . If you have some symptoms but not all symptoms, continue to monitor at home and seek medical attention if your symptoms worsen. . If you are having a medical emergency, call 911.   [facility] / e-Visit: [email or web address]         [facility]: [facility]: [id number]                   [facility]: [phone]

## 2019-05-19 NOTE — Patient Instructions (Signed)

## 2019-05-19 NOTE — Progress Notes (Signed)
Okay to treat with HR of 118 per Cira Rue NP

## 2019-05-19 NOTE — Progress Notes (Signed)
[facility] Spiritual Care Note  Followed up with Beverly Schwartz in infusion for social and emotional support. She sounded relieved at a proposed reduction in chemo dosage to try to support her quality of life, given how sick she felt (and for so long) after her last treatment. Provided empathic listening and affirmation of strengths as she shared and processed personal updates.  Continuing to follow for support, but please also page if needs arise or circumstances change. Thank you.   [place], [place], [facility] Pager [phone] Voicemail [phone]

## 2019-05-19 NOTE — Progress Notes (Signed)
Spoke w/ Regan Rakers, patient will have irinotecan dose reduction today to 140 mg/m2 for diarrhea. Patient has been instructed to increase her magnesium oxide to 400mg  TID for low magnesium today.   Demetrius Charity, PharmD, [facility] Pharmacist Pharmacy Phone: [phone] 05/19/2019

## 2019-05-19 NOTE — Progress Notes (Signed)
CATHFLO given at 1220 by Scot Dock, R RN. Sent to lab for blood draw. Made Beverly Schwartz aware.

## 2019-05-20 ENCOUNTER — Telehealth: Payer: Self-pay | Admitting: Nurse Practitioner

## 2019-05-20 NOTE — Telephone Encounter (Signed)
No los per 2/25. 

## 2019-05-22 ENCOUNTER — Other Ambulatory Visit: Payer: Self-pay | Admitting: Hematology

## 2019-05-22 DIAGNOSIS — I10 Essential (primary) hypertension: Secondary | ICD-10-CM

## 2019-05-22 DIAGNOSIS — C19 Malignant neoplasm of rectosigmoid junction: Secondary | ICD-10-CM

## 2019-05-23 NOTE — Progress Notes (Signed)
Counseling Intern called patient on 05/23/2019 for a scheduled telehealth counseling session via phone. Pt did not answer and CI left voicemail encouraging pt to call to reschedule. CI will check-in with pt on Thursday 05/26/19 to follow-up if we have not connected before then.  Art Buff [place]oup Counseling Intern Voicemail:  [phone]

## 2019-05-31 ENCOUNTER — Encounter: Payer: Self-pay | Admitting: Hematology

## 2019-05-31 ENCOUNTER — Telehealth: Payer: Self-pay | Admitting: Hematology

## 2019-05-31 NOTE — Telephone Encounter (Signed)
I spoke with Beverly Schwartz she states she just doesn't feel good enough for treatment on Thursday, no specific complaints.  I reviewed with Dr. Burr Medico. Patient rescheduled for Monday 3/15.  Scheduling message sent to adjust schedule.

## 2019-05-31 NOTE — Telephone Encounter (Signed)
I talk with patient regarding reschedule °

## 2019-06-02 ENCOUNTER — Ambulatory Visit: Payer: BC Managed Care – PPO

## 2019-06-02 ENCOUNTER — Other Ambulatory Visit: Payer: BC Managed Care – PPO

## 2019-06-02 ENCOUNTER — Ambulatory Visit: Payer: BC Managed Care – PPO | Admitting: Hematology

## 2019-06-04 ENCOUNTER — Encounter: Payer: Self-pay | Admitting: Hematology

## 2019-06-04 ENCOUNTER — Other Ambulatory Visit: Payer: Self-pay | Admitting: Hematology

## 2019-06-05 ENCOUNTER — Other Ambulatory Visit: Payer: Self-pay | Admitting: Hematology

## 2019-06-05 NOTE — Progress Notes (Signed)
Beverly Schwartz   [phone] [phone]) [phone]   Clinic Follow up Note   Patient Care Team: Esaw Grandchild, NP as PCP - General (Family Medicine) Delrae Rend, MD as Consulting Physician (Endocrinology) Truitt Merle, MD as Consulting Physician (Hematology) Alla Feeling, NP as Nurse Practitioner (Nurse Practitioner) Clarene Essex, MD as Consulting Physician (Gastroenterology) 06/06/2019  CHIEF COMPLAINT: F/u metastatic colon cancer  SUMMARY OF ONCOLOGIC HISTORY: Oncology History Overview Note  Cancer Staging Malignant neoplasm of rectosigmoid junction [facility]) Staging form: Colon and Rectum, AJCC 8th Edition - Clinical stage from 06/11/2017: Stage IVA (cTX, cN1b, pM1a) - Signed by Truitt Merle, MD on 06/15/2017     Malignant neoplasm of rectosigmoid junction (Beverly Schwartz)  05/30/2017 Imaging   CT AP IMPRESSION: 1. 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 Abdominopelvic adenopathy. Rectosigmoid mass with suggestion of partial obstruction as evidenced by large colonic stool burden more proximally. 2.  Aortic Atherosclerosis (ICD10-I70.0).  This is age advanced.   05/31/2017 Tumor Marker   CEA 353.3 (elevated) AFP 4.5 (normal)   05/31/2017 Initial Biopsy   Diagnosis Colon, biopsy, sigmoid - INVASIVE ADENOCARCINOMA - SEE COMMENT   05/31/2017 Imaging   CT CHEST IMPRESSION: 1. No evidence of metastatic disease in the chest. 2. No acute findings.  Aortic Atherosclerosis (ICD10-I70.0).    05/31/2017 Procedure   Colonoscopy per Dr. Watt Climes Findings: An infiltrative and ulcerated partially obstructing medium-sized mass was found in the recto-sigmoid colon. The mass was circumferential. The mass measured four cm in length. No bleeding was present.   Impression - One small polyp in the rectum. - The examination was otherwise normal. - Malignant partially obstructing tumor in the recto-sigmoid colon.  Biopsied. - One medium polyp in the proximal sigmoid colon. - The examination was otherwise normal. - Internal hemorrhoids.   06/03/2017 Initial Diagnosis   Malignant neoplasm of transverse colon (Beverly Schwartz)   06/11/2017 Cancer Staging   Staging form: Colon and Rectum, AJCC 8th Edition - Clinical stage from 06/11/2017: Stage IVA (cTX, cN1b, pM1a) - Signed by Truitt Merle, MD on 06/15/2017   06/11/2017 Pathology Results   Liver biopsy confirmed metastatic colon cancer   07/23/2017 Imaging   CT CAP IMPRESSION: 1. Mild progression of hepatic metastasis. 2. Similar rectosigmoid primary with abdominopelvic nodal metastasis. 3.  No acute process or evidence of metastatic disease in the chest. 4. Aortic Atherosclerosis (ICD10-I70.0). Coronary artery atherosclerosis. This is age advanced. 5. Proximal colonic constipation, again suggesting a component of partial obstruction at the primary site. 6. Mild ascending aortic dilatation at 4.1 cm.   08/25/2017 - 05/10/2018 Chemotherapy   -Xeloda '1500mg'$  BID 1 week on and 1 week off starting 08/25/17. Due to her worsening hand-foot syndrome her dose was reduced to '1500mg'$  in the AM and '1000mg'$  in the PM. Due to disease progression we swithced her to CAPOX  -Vectibix every 2 weeks starting 08/25/17. Due to skin rash will stop starting 05/10/18.     12/14/2017 Imaging   IMPRESSION: 1. Response to therapy. Marked decrease in hepatic metastatic burden. More mild decrease in abdominopelvic adenopathy and definition of rectosigmoid primary. 2.  No acute process or evidence of metastatic disease in the chest. 3.  Aortic Atherosclerosis (ICD10-I70.0).    04/28/2018 Imaging   CT CAP 04/28/18  IMPRESSION: 1. Index liver metastases measured on the previous study show no substantial interval change although new liver lesions on today's exam are concerning for progressive disease. 2. Mild lymphadenopathy in the upper abdomen is  stable. 3. Persistent mild ill-defined wall thickening  in the distal sigmoid colon near the rectosigmoid junction. 4.  Aortic Atherosclerois (ICD10-170.0)    05/17/2018 - 12/02/2018 Chemotherapy   CAPOX every 3 weeks with Xeloda '1500mg'$  BID 2 weeks on/1week off starting 05/17/18 -Add Avastin to first cycle.    09/09/2018 Imaging   CT CAP W Contrast IMPRESSION: 1. Slight interval decrease in size one of the right hepatic lobe lesions. Additional lesions within the liver are similar when compared to recent prior exam. 2. Mild adenopathy within the abdomen is stable. 3. Similar-appearing thick walled distal sigmoid colon.   12/20/2018 Imaging   CT CAP W Contrast  IMPRESSION: 1. Increase in size and number of hepatic metastatic lesions compared to the prior study. 2. Stable adenopathy in the abdomen. 3. Signs of sigmoid wall thickening as before.   01/04/2019 - 01/28/2019 Chemotherapy   Irinotecan and Avastin q2weeks starting 01/04/19.  Stoppes due to disease progression.    03/08/2019 Imaging   CT CAP W Contrast  IMPRESSION: 1. Interval progression of liver metastasis. 2. Stable borderline enlarged upper abdominal lymph node. 3. Aortic atherosclerosis.   Aortic Atherosclerosis (ICD10-I70.0).   04/04/2019 -  Chemotherapy   FOLFIRI and Vectibix q2weeks starting 04/04/19     CURRENT THERAPY:  Irinotecanand Vectibix q2weeks starting 04/04/19.50% 5FU with C2 only. Will add Xeloda '1000mg'$  BID 1 week on/1 week off starting with C4.  INTERVAL HISTORY: Ms. Beverly Schwartz returns for f/u and treatment as scheduled. She completed cycle 4 panitumumab and dose-reduced irinotecan, and started Xeloda (1 week on/1 week off) on 05/19/19. She had cramping abdominal pain and diarrhea with 2-3 episodes of vomiting for a few days after treatment. Appetite and energy level were low, she did not want to be out of bed. She had a bleeding episode after BM when she became raw after so many stools. Imodium eventually helped. She did not drink very well. She bit her  tongue in her sleep and it remains sore. Skin rash is very mild, has dry skin. No redness to palms.  It took her about 1.5 weeks to recover, only felt better in last few days. Her mother thinks she needs another week to recover. She takes 6 mag "gummies" 200 mg each. Has not started calcium yet.      MEDICAL HISTORY:  Past Medical History:  Diagnosis Date  . Anxiety   . Cancer ([place])    colon, liver  . Chest pain 10/18/2018   Patient c/o chest pain right side yesterday intermitten lasting 3-4 hours.  [name], PA notified  . Colon cancer ([place])   . Complication of anesthesia   . Depression   . Diabetes mellitus   . Dizziness   . Family history of breast cancer   . Family history of stomach cancer   . Hyperlipidemia   . Hypertension   . Hypothyroidism   . Obesity   . PONV (postoperative nausea and vomiting)   . Tachycardia     SURGICAL HISTORY: Past Surgical History:  Procedure Laterality Date  . FLEXIBLE SIGMOIDOSCOPY N/A 05/31/2017   Procedure: FLEXIBLE SIGMOIDOSCOPY;  Surgeon: Clarene Essex, MD;  Location: [facility];  Service: Endoscopy;  Laterality: N/A;  . IR IMAGING GUIDED PORT INSERTION  10/19/2018  . WISDOM TOOTH EXTRACTION     age 38's    I have reviewed the social history and family history with the patient and they are unchanged from previous note.  ALLERGIES:  is allergic to bupropion and amoxicillin.  MEDICATIONS:  Current Outpatient Medications  Medication Sig Dispense Refill  . acetaminophen-codeine (TYLENOL #3) 300-30 MG tablet Take 1 tablet by mouth every 6 (six) hours as needed for moderate pain. 30 tablet 0  . albuterol (PROVENTIL HFA;VENTOLIN HFA) 108 (90 Base) MCG/ACT inhaler Inhale 2 puffs into the lungs every 6 (six) hours as needed for wheezing or shortness of breath. 1 Inhaler 0  . ALPRAZolam (XANAX) 1 MG tablet TAKE 1 TABLET BY MOUTH AT BEDTIME AS NEEDED FOR ANXIETY (Patient taking differently: Take 1 mg by mouth at bedtime as needed for anxiety.  ) 30 tablet 0  . amLODipine (NORVASC) 10 MG tablet TAKE 1 TABLET BY MOUTH EVERY DAY 30 tablet 0  . benzonatate (TESSALON) 100 MG capsule Take 1 capsule (100 mg total) by mouth 3 (three) times daily as needed for cough. 30 capsule 0  . capecitabine (XELODA) 500 MG tablet Take 2 tablets (1,000 mg total) by mouth 2 (two) times daily after a meal. Take for 7 days, then hold for 7 days. Repeat every 14 days. 56 tablet 0  . carbamide peroxide (DEBROX) 6.5 % OTIC solution Place 5 drops into both ears 2 (two) times daily. 15 mL 0  . CINNAMON PO Take 1 tablet by mouth daily.     . clindamycin (CLINDAGEL) 1 % gel Apply topically 2 (two) times daily. 30 g 2  . fluticasone (FLONASE) 50 MCG/ACT nasal spray Place 1 spray into both nostrils daily. (Patient taking differently: Place 1 spray into both nostrils daily as needed for allergies. ) 16 g 2  . insulin NPH-regular Human (NOVOLIN 70/30) (70-30) 100 UNIT/ML injection INJECT 10 UNITS WITH BREAKFAST, 10 UNITS WITH DINNER (Patient taking differently: Inject 10 Units into the skin 2 (two) times daily with a meal. ) 10 mL 2  . Insulin Syringe-Needle U-100 (BD INSULIN SYRINGE U/F) 31G X 5/16" 1 ML MISC Inject twice a day as directed 200 each 3  . levothyroxine (SYNTHROID) 112 MCG tablet TAKE 1 TABLET BY MOUTH DAILY BEFORE BREAKFAST. LABS REQUIRED PRIOR TO ANY FURTHER REFILLS. 30 tablet 0  . lidocaine-prilocaine (EMLA) cream Apply 1 application topically as needed. 30 g 1  . lisinopril (ZESTRIL) 5 MG tablet TAKE 1 TABLET BY MOUTH EVERY DAY 90 tablet 0  . magnesium oxide (MAG-OX) 400 (241.3 Mg) MG tablet Take 1 tablet (400 mg total) by mouth daily. (Patient taking differently: Take 400 mg by mouth in the morning, at noon, and at bedtime. ) 90 tablet 1  . magnesium oxide (MAG-OX) 400 MG tablet Take 400 mg by mouth 2 (two) times daily.    . mometasone (ELOCON) 0.1 % cream Apply 1 application topically daily as needed.    . Omega-3 Fatty Acids (FISH OIL) 1000 MG CAPS Take  1,000 mg by mouth daily.     . polyethylene glycol (MIRALAX / GLYCOLAX) packet Take 17 g by mouth daily. 14 each 0  . TURMERIC PO Take 1 capsule by mouth daily.      No current facility-administered medications for this visit.   Facility-Administered Medications Ordered in Other Visits  Medication Dose Route Frequency Provider Last Rate Last Admin  . 0.9 %  sodium chloride infusion   Intravenous Continuous Truitt Merle, MD   Stopped at 11/11/18 1330  . 0.9 %  sodium chloride infusion   Intravenous Continuous Alla Feeling, NP 20 mL/hr at 06/06/19 1327 New Bag at 06/06/19 1327    PHYSICAL EXAMINATION: ECOG PERFORMANCE STATUS: 2 - Symptomatic, <50% confined  to bed  Vitals:   06/06/19 1203 06/06/19 1206  BP: 108/86 109/82  Pulse: (!) 110 (!) 108  Resp: 18   Temp: 98.5 F (36.9 C)   SpO2: 98%    Filed Weights   06/06/19 1203  Weight: 147 lb 1.6 oz (66.7 kg)    GENERAL:alert, no distress and comfortable SKIN: mild acne type rash to chest, dry skin. Palms without erythema  EYES:  sclera clear OROPHARYNX: no thrush. Healing ulceration to right lateral tongue  NECK: without mass LUNGS: clear with normal breathing effort HEART: tachycardia, mild lower extremity edema ABDOMEN:abdomen soft, non-tender and normal bowel sounds NEURO: alert & oriented x 3 with fluent speech PAC without erythema   LABORATORY DATA:  I have reviewed the data as listed CBC Latest Ref Rng & Units 06/06/2019 05/19/2019 05/02/2019  WBC 4.0 - 10.5 K/uL 11.1(H) 11.4(H) 5.3  Hemoglobin 12.0 - 15.0 g/dL 11.1(L) 12.7 10.9(L)  Hematocrit 36.0 - 46.0 % 35.7(L) 41.2 35.5(L)  Platelets 150 - 400 K/uL 459(H) 313 265     CMP Latest Ref Rng & Units 06/06/2019 05/19/2019 05/02/2019  Glucose 70 - 99 mg/dL 185(H) 162(H) 133(H)  BUN 6 - 20 mg/dL 4(L) 10 6  Creatinine 0.44 - 1.00 mg/dL 0.72 0.73 0.65  Sodium 135 - 145 mmol/L 147(H) 142 143  Potassium 3.5 - 5.1 mmol/L 3.8 3.8 3.8  Chloride 98 - 111 mmol/L 105 103 108  CO2  22 - 32 mmol/L '26 27 25  '$ Calcium 8.9 - 10.3 mg/dL 5.8(LL) 6.8(L) 6.3(LL)  Total Protein 6.5 - 8.1 g/dL 6.7 7.3 6.5  Total Bilirubin 0.3 - 1.2 mg/dL 0.4 0.6 0.3  Alkaline Phos 38 - 126 U/L 366(H) 296(H) 288(H)  AST 15 - 41 U/L 59(H) 32 31  ALT 0 - 44 U/L '26 17 18      '$ RADIOGRAPHIC STUDIES: I have personally reviewed the radiological images as listed and agreed with the findings in the report. No results found.   ASSESSMENT & PLAN: Beonca Gibb Knightis a 49 y.o.femalewith   1. Sigmoid adenocarcinoma, with abdominopelvic adenopathy andhepatic metastasis, stage IV, MSI-stable , KRAS/NRAS/BRAF wild type -Diagnosed in 05/2017. Patient declined intensive chemo regimens previously due to fear of side effects.She progressed on first lineXelodaandVectibixafter 8 months of therapy. She progressed on second line CAPOX and avastin, she did not tolerate well due to hand/foot syndrome and neuropathy. -She had PAC placed on 10/19/18. -She started third line irinotecan and avastin on 10/13, patient declined starting 5FU while she moved into new house on her mother's land. She received 2 doses chemo before restaging on 03/08/19 showed interval progression of liver mets and stable to borderline abd LNs -She was hospitalized for syncope, confusion, n/v/ and pain. CT on 03/27/19 showed progression of liver metastasis and new and progressive adenopathy, CEA was elevated to 1277 which is consistent with progression.  -She started FOLFIRI on 1/11, 5FU was held with cycle 1 but she agreed to try pump over 24 hours (1/2 dose) on 1/26 with cycle 2, she did not tolerate well due to fatigue and nausea and did not want to retry -on cycle 3 she had irinotecan and panitumumab, Xeloda was added with cycle 4 on 2/25 -Ms. Seufert appears stable today. She completed cycle 4 irinotecan/panitumumab and added Xeloda. She did not tolerate well, with significant fatigue, low appetite, diarrhea, and mouth sore. Took 1.5 weeks  to recover.  -Labs reviewed, CBC stable. CMP shows electrolyte abnormalities with corrected calcium 6.6, Mg 1.0, and increased  alk phos. Electrolyte abnormalities possibly from panitumumab vs disease progression. CEA increased to 471.  -We will hold today's treatment and give IV Mg 4 g, she will start oral calcium supplement once daily. She will continue oral Mag TID (she is taking OTC chewable mag to equal the prescribed dose).  -She will return for repeat lab and f/u later this week, and possible treatment if she has recovered well. She is reluctant to do irinotecan again. She will discuss further treatment options with Dr. Burr Medico at the visit.   2. DM, HTN, HL -HTN improved after she came off Avastin. Now only taking lisinopril -stable  3. Financial and Social issues, Anxiety, depression -followed by SW and Chaplain -She is followed by mental health provider Art Buff -On Xanax PRN which is helpful, she is requiring less now that she lives close to her mother  -Sheregained insurancewithBlue cross in 2020  4.Goal of care discussion -She understands the goal of care is palliative, her cancer is incurable. The goal of therapy is to prolong her life and improve her quality of life. -She is full code for now   5.Insomnia, anxiety  -improved overall   6. 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 -not discussed today  7. Skin acne rash, secondary to Vectibix  -mild on her face and neck, moderate on chest  -She hasOTC hydrocortisone cream and Clindamycin gel which I refilled today -I encouraged her to use thick lotion on her hands once she starts Xeloda and monitor for hand/foot syndrome -rash improved, only mild on her chest  8. Iron deficiency  -Ferritin 226 on 05/02/19, she is not on oral iron.  I encouraged her to take 1 tab MWF if she can tolerate.   PLAN: -Labs reviewed -Hold treatment -Supportive care with 4 g Mg IV today, continue oral supplement TID  -Begin oral calcium once daily  -Lab, f/u and possible treatment end of this week if she recovers  -The plan was discussed with Dr. Burr Medico and pharmacy  All questions were answered. The patient knows to call the clinic with any problems, questions or concerns. No barriers to learning was detected. I spent 20 minutes counseling the patient face to face. The total time spent in the appointment was 30 minutes and more than 50% was on counseling, review of test results, and coordinate of care     Alla Feeling, NP 06/06/19

## 2019-06-06 ENCOUNTER — Encounter: Payer: Self-pay | Admitting: Nurse Practitioner

## 2019-06-06 ENCOUNTER — Encounter: Payer: Self-pay | Admitting: General Practice

## 2019-06-06 ENCOUNTER — Inpatient Hospital Stay (HOSPITAL_BASED_OUTPATIENT_CLINIC_OR_DEPARTMENT_OTHER): Payer: BC Managed Care – PPO | Admitting: Nurse Practitioner

## 2019-06-06 ENCOUNTER — Inpatient Hospital Stay: Payer: BC Managed Care – PPO | Attending: Hematology

## 2019-06-06 ENCOUNTER — Other Ambulatory Visit: Payer: Self-pay

## 2019-06-06 ENCOUNTER — Ambulatory Visit: Payer: BC Managed Care – PPO | Admitting: Nutrition

## 2019-06-06 ENCOUNTER — Inpatient Hospital Stay: Payer: BC Managed Care – PPO

## 2019-06-06 DIAGNOSIS — C187 Malignant neoplasm of sigmoid colon: Secondary | ICD-10-CM | POA: Diagnosis present

## 2019-06-06 DIAGNOSIS — Z5189 Encounter for other specified aftercare: Secondary | ICD-10-CM | POA: Diagnosis not present

## 2019-06-06 DIAGNOSIS — Z95828 Presence of other vascular implants and grafts: Secondary | ICD-10-CM

## 2019-06-06 DIAGNOSIS — D500 Iron deficiency anemia secondary to blood loss (chronic): Secondary | ICD-10-CM

## 2019-06-06 DIAGNOSIS — C19 Malignant neoplasm of rectosigmoid junction: Secondary | ICD-10-CM

## 2019-06-06 DIAGNOSIS — C787 Secondary malignant neoplasm of liver and intrahepatic bile duct: Secondary | ICD-10-CM | POA: Insufficient documentation

## 2019-06-06 LAB — CMP (CANCER CENTER ONLY)
ALT: 26 U/L (ref 0–44)
AST: 59 U/L — ABNORMAL HIGH (ref 15–41)
Albumin: 2.7 g/dL — ABNORMAL LOW (ref 3.5–5.0)
Alkaline Phosphatase: 366 U/L — ABNORMAL HIGH (ref 38–126)
Anion gap: 16 — ABNORMAL HIGH (ref 5–15)
BUN: 4 mg/dL — ABNORMAL LOW (ref 6–20)
CO2: 26 mmol/L (ref 22–32)
Calcium: 5.8 mg/dL — CL (ref 8.9–10.3)
Chloride: 105 mmol/L (ref 98–111)
Creatinine: 0.72 mg/dL (ref 0.44–1.00)
GFR, Est AFR Am: >60 mL/min (ref >60)
GFR, Estimated: >60 mL/min (ref >60)
Glucose, Bld: 185 mg/dL — ABNORMAL HIGH (ref 70–99)
Potassium: 3.8 mmol/L (ref 3.5–5.1)
Sodium: 147 mmol/L — ABNORMAL HIGH (ref 135–145)
Total Bilirubin: 0.4 mg/dL (ref 0.3–1.2)
Total Protein: 6.7 g/dL (ref 6.5–8.1)

## 2019-06-06 LAB — CBC WITH DIFFERENTIAL (CANCER CENTER ONLY)
Abs Immature Granulocytes: 0.17 10*3/uL — ABNORMAL HIGH (ref 0.00–0.07)
Basophils Absolute: 0.1 10*3/uL (ref 0.0–0.1)
Basophils Relative: 1 %
Eosinophils Absolute: 0.3 10*3/uL (ref 0.0–0.5)
Eosinophils Relative: 3 %
HCT: 35.7 % — ABNORMAL LOW (ref 36.0–46.0)
Hemoglobin: 11.1 g/dL — ABNORMAL LOW (ref 12.0–15.0)
Immature Granulocytes: 2 %
Lymphocytes Relative: 19 %
Lymphs Abs: 2.1 10*3/uL (ref 0.7–4.0)
MCH: 26.2 pg (ref 26.0–34.0)
MCHC: 31.1 g/dL (ref 30.0–36.0)
MCV: 84.4 fL (ref 80.0–100.0)
Monocytes Absolute: 1.3 10*3/uL — ABNORMAL HIGH (ref 0.1–1.0)
Monocytes Relative: 12 %
Neutro Abs: 7.1 10*3/uL (ref 1.7–7.7)
Neutrophils Relative %: 63 %
Platelet Count: 459 10*3/uL — ABNORMAL HIGH (ref 150–400)
RBC: 4.23 MIL/uL (ref 3.87–5.11)
RDW: 17.5 % — ABNORMAL HIGH (ref 11.5–15.5)
WBC Count: 11.1 10*3/uL — ABNORMAL HIGH (ref 4.0–10.5)
nRBC: 0.2 % (ref 0.0–0.2)

## 2019-06-06 LAB — IRON AND TIBC
Iron: 18 ug/dL — ABNORMAL LOW (ref 41–142)
Saturation Ratios: 7 % — ABNORMAL LOW (ref 21–57)
TIBC: 251 ug/dL (ref 236–444)
UIBC: 233 ug/dL (ref 120–384)

## 2019-06-06 LAB — MAGNESIUM: Magnesium: 1 mg/dL — CL (ref 1.7–2.4)

## 2019-06-06 LAB — CEA (IN HOUSE-CHCC): CEA (CHCC-In House): 471.65 ng/mL — ABNORMAL HIGH (ref 0.00–5.00)

## 2019-06-06 LAB — FERRITIN: Ferritin: 226 ng/mL (ref 11–307)

## 2019-06-06 LAB — TOTAL PROTEIN, URINE DIPSTICK: Protein, ur: 30 mg/dL — AB

## 2019-06-06 MED ORDER — SODIUM CHLORIDE 0.9% FLUSH
10.0000 mL | Freq: Once | INTRAVENOUS | Status: AC
  Administered 2019-06-06: 10 mL
  Filled 2019-06-06: qty 10

## 2019-06-06 MED ORDER — SODIUM CHLORIDE 0.9 % IV SOLN
INTRAVENOUS | Status: DC
  Filled 2019-06-06: qty 250

## 2019-06-06 MED ORDER — MAGNESIUM SULFATE 2 GM/50ML IV SOLN
INTRAVENOUS | Status: AC
  Filled 2019-06-06: qty 100

## 2019-06-06 MED ORDER — ALTEPLASE 2 MG IJ SOLR
2.0000 mg | Freq: Once | INTRAMUSCULAR | Status: AC
  Administered 2019-06-06: 2 mg
  Filled 2019-06-06: qty 2

## 2019-06-06 MED ORDER — HEPARIN SOD (PORK) LOCK FLUSH 100 UNIT/ML IV SOLN
500.0000 [IU] | Freq: Once | INTRAVENOUS | Status: AC
  Administered 2019-06-06: 500 [IU] via INTRAVENOUS
  Filled 2019-06-06: qty 5

## 2019-06-06 MED ORDER — MAGNESIUM SULFATE 4 GM/100ML IV SOLN
4.0000 g | Freq: Once | INTRAVENOUS | Status: AC
  Administered 2019-06-06: 4 g via INTRAVENOUS
  Filled 2019-06-06: qty 100

## 2019-06-06 MED ORDER — ALTEPLASE 2 MG IJ SOLR
INTRAMUSCULAR | Status: AC
  Filled 2019-06-06: qty 2

## 2019-06-06 NOTE — Progress Notes (Signed)
Patient requested follow-up today secondary questions. She continues to have nausea which is not always improved with antiemetics. Her appetite is poor. She has questions about oral nutrition supplements. Current weight was documented as 147.1 pounds on March 15. Labs reviewed and noted sodium 147, glucose 185, albumin 2.7, and magnesium 1.0.  Nutrition diagnosis: Inadequate oral intake continues.  Intervention: Patient was educated on strategies for consuming smaller more frequent meals and snacks utilizing bland foods as tolerated. Educated patient on the variety of oral nutrition supplements available and provided samples. Discussed importance of patient taking nausea medication and communicating with physician if this is not improved. Brief education provided on high magnesium foods. Questions were answered.  Monitoring, evaluation, goals: Patient will tolerate adequate calories and protein to minimize weight loss.  Next visit: Tuesday, March 30 during infusion.  **Disclaimer: This note was dictated with voice recognition software. Similar sounding words can inadvertently be transcribed and this note may contain transcription errors which may not have been corrected upon publication of note.**

## 2019-06-06 NOTE — Patient Instructions (Signed)

## 2019-06-06 NOTE — Progress Notes (Signed)
Critical Values:  Ca 5.8 Mag 1.0 Dr. Burr Medico notified

## 2019-06-06 NOTE — Progress Notes (Signed)
[facility] Note  Followed up with Beverly Schwartz in infusion today. Her talking stamina was lower today due to general low energy (see labs/postponement of treatment) and a tongue injury, so we kept the visit shorter. A recent highlight for Beverly Schwartz was a masked, distanced outdoor visit from her cousin and his wife. She notes that her treatment schedule and consequent tiredness has compromised her availability for counseling appointments, but she values and looks forward to them. I will plan to phone her next week to check in.   [place], [place], [place] [facility] Pager [phone] Voicemail [phone]

## 2019-06-06 NOTE — Patient Instructions (Signed)
Hypomagnesemia Hypomagnesemia is a condition in which the level of magnesium in the blood is low. Magnesium is a mineral that is found in many foods. It is used in many different processes in the body. Hypomagnesemia can affect every organ in the body. In severe cases, it can cause life-threatening problems. What are the causes? This condition may be caused by:  Not getting enough magnesium in your diet.  Malnutrition.  Problems with absorbing magnesium from the intestines.  Dehydration.  Alcohol abuse.  Vomiting.  Severe or chronic diarrhea.  Some medicines, including medicines that make you urinate more (diuretics).  Certain diseases, such as kidney disease, diabetes, celiac disease, and overactive thyroid. What are the signs or symptoms? Symptoms of this condition include:  Loss of appetite.  Nausea and vomiting.  Involuntary shaking or trembling of a body part (tremor).  Muscle weakness.  Tingling in the arms and legs.  Sudden tightening of muscles (muscle spasms).  Confusion.  Psychiatric issues, such as depression, irritability, or psychosis.  A feeling of fluttering of the heart.  Seizures. These symptoms are more severe if magnesium levels drop suddenly. How is this diagnosed? This condition may be diagnosed based on:  Your symptoms and medical history.  A physical exam.  Blood and urine tests. How is this treated? Treatment depends on the cause and the severity of the condition. It may be treated with:  A magnesium supplement. This can be taken in pill form. If the condition is severe, magnesium is usually given through an IV.  Changes to your diet. You may be directed to eat foods that have a lot of magnesium, such as green leafy vegetables, peas, beans, and nuts.  Stopping any intake of alcohol. Follow these instructions at home:      Make sure that your diet includes foods with magnesium. Foods that have a lot of magnesium in them  include: ? Green leafy vegetables, such as spinach and broccoli. ? Beans and peas. ? Nuts and seeds, such as almonds and sunflower seeds. ? Whole grains, such as whole grain bread and fortified cereals.  Take magnesium supplements if your health care provider tells you to do that. Take them as directed.  Take over-the-counter and prescription medicines only as told by your health care provider.  Have your magnesium levels monitored as told by your health care provider.  When you are active, drink fluids that contain electrolytes.  Avoid drinking alcohol.  Keep all follow-up visits as told by your health care provider. This is important. Contact a health care provider if:  You get worse instead of better.  Your symptoms return. Get help right away if you:  Develop severe muscle weakness.  Have trouble breathing.  Feel that your heart is racing. Summary  Hypomagnesemia is a condition in which the level of magnesium in the blood is low.  Hypomagnesemia can affect every organ in the body.  Treatment may include eating more foods that contain magnesium, taking magnesium supplements, and not drinking alcohol.  Have your magnesium levels monitored as told by your health care provider. This information is not intended to replace advice given to you by your health care provider. Make sure you discuss any questions you have with your health care provider. Document Revised: 02/20/2017 Document Reviewed: 02/09/2017 Elsevier Patient Education  [place].   Hypocalcemia, Adult Hypocalcemia is when the level of calcium in a person's blood is below normal. Calcium is a mineral that is used by the body in many  ways. Not having enough blood calcium can affect the nervous system. This can lead to problems with muscles, the heart, and the brain. What are the causes? This condition may be caused by:  A deficiency of vitamin D or magnesium or both.  Decreased levels of  parathyroid hormone (hypoparathyroidism).  Kidney function problems.  Low levels of a body protein called albumin.  Inflammation of the pancreas (pancreatitis).  Not taking in enough vitamins and minerals in the diet or having intestinal problems that interfere with nutrient absorption.  Certain medicines. What are the signs or symptoms? Some people may not have any symptoms, especially if they have long-term (chronic) hypocalcemia. Symptoms of this condition may include:  Numbness and tingling in the fingers, toes, or around the mouth.  Muscle twitching, aches, or cramps, especially in the legs, feet, and back.  Spasm of the voice box (laryngospasm). This may make it difficult to breath or speak.  Fast heartbeats (palpitations) and abnormal heart rhythms (arrhythmias).  Shaking uncontrollably (seizures).  Memory problems, confusion, or difficulty thinking.  Depression, anxiety, irritability, or changes in personality. Long-term symptoms of this condition may include:  Coarse, brittle hair and nails.  Dry skin or lasting skin diseases (psoriasis, eczema, or dermatitis).  Dental cavities.  Clouding of the eye lens (cataracts). How is this diagnosed?  This condition is usually diagnosed with a blood test. You may also have other tests to help determine the underlying cause of the condition. This may include more blood tests and imaging tests. How is this treated? This condition may be treated with:  Calcium given by mouth (orally) or given through an IV. The method used for giving calcium will depend on the severity of the condition. If your condition is severe, you may need to be closely monitored in the hospital.  Giving other minerals (electrolytes), such as magnesium. Other treatment will depend on the cause of the condition. Follow these instructions at home:  Follow diet instructions from your health care provider or dietitian.  Take supplements only as told by  your health care provider.  Keep all follow-up visits as told by your health care provider. This is important. Contact a health care provider if you:  Have increased muscle twitching or cramps.  Have new swelling in the feet, ankles, or legs.  Develop changes in mood, memory, or personality. Get help right away if you:  Have chest pain.  Have persistent rapid or irregular heartbeats.  Have difficulty breathing.  Faint.  Start to have seizures.  Have confusion. Summary  Hypocalcemia is when the level of calcium in a person's blood is below normal. Not having enough blood calcium can affect the nervous system. This can lead to problems with muscles, the heart, and the brain.  This condition may be treated with calcium given by mouth or through an IV, taking other minerals, and treating the underlying cause of hypocalcemia.  Take supplements only as told by your health care provider.  Contact a health care provider if you have new or worsening symptoms.  Keep all follow-up visits as told by your health care provider. This is important. This information is not intended to replace advice given to you by your health care provider. Make sure you discuss any questions you have with your health care provider. Document Revised: 03/19/2018 Document Reviewed: 03/19/2018 Elsevier Patient Education  2020 [employer].

## 2019-06-07 ENCOUNTER — Telehealth: Payer: Self-pay | Admitting: Nurse Practitioner

## 2019-06-07 NOTE — Telephone Encounter (Signed)
Scheduled appt per 3/15 los.  Left a detailed voice message of the scheduled appt date and time.

## 2019-06-09 ENCOUNTER — Other Ambulatory Visit: Payer: Self-pay | Admitting: Adult Health

## 2019-06-10 ENCOUNTER — Inpatient Hospital Stay: Payer: BC Managed Care – PPO

## 2019-06-10 ENCOUNTER — Other Ambulatory Visit: Payer: Self-pay | Admitting: Family Medicine

## 2019-06-10 ENCOUNTER — Inpatient Hospital Stay: Payer: BC Managed Care – PPO | Admitting: Hematology

## 2019-06-10 ENCOUNTER — Encounter: Payer: Self-pay | Admitting: Hematology

## 2019-06-13 ENCOUNTER — Telehealth: Payer: Self-pay

## 2019-06-13 NOTE — Progress Notes (Signed)
Counseling intern called patient on 06/13/19 to check-in. Pt missed her last scheduled session and CI has tried to reach her several times by phone to reschedule. CI left a voicemail for pt and encouraged her to call back to reschedule a counseling session.   Art [facility] [place] [facility]ing Intern Voicemail:  [phone]

## 2019-06-13 NOTE — Telephone Encounter (Signed)
I returned Beverly Vina Schwartz, Beverly Schwartz's vm.  She states that Beverly Schwartz is not doing well.  She thinks that her magnesium is low.  Appointments have been made for Beverly Schwartz to come for labs, Beverly Schwartz, and ivf tomorrow.  Beverly Schwartz also expressed interest in palliative care services.  This information was relayed to Beverly Rue, NP.

## 2019-06-14 ENCOUNTER — Encounter: Payer: Self-pay | Admitting: General Practice

## 2019-06-14 ENCOUNTER — Other Ambulatory Visit: Payer: BC Managed Care – PPO

## 2019-06-14 ENCOUNTER — Inpatient Hospital Stay (HOSPITAL_BASED_OUTPATIENT_CLINIC_OR_DEPARTMENT_OTHER): Payer: BC Managed Care – PPO | Admitting: Nurse Practitioner

## 2019-06-14 ENCOUNTER — Inpatient Hospital Stay: Payer: BC Managed Care – PPO

## 2019-06-14 ENCOUNTER — Other Ambulatory Visit: Payer: Self-pay

## 2019-06-14 ENCOUNTER — Encounter: Payer: Self-pay | Admitting: Nurse Practitioner

## 2019-06-14 DIAGNOSIS — D500 Iron deficiency anemia secondary to blood loss (chronic): Secondary | ICD-10-CM

## 2019-06-14 DIAGNOSIS — Z7189 Other specified counseling: Secondary | ICD-10-CM

## 2019-06-14 DIAGNOSIS — C187 Malignant neoplasm of sigmoid colon: Secondary | ICD-10-CM

## 2019-06-14 DIAGNOSIS — C19 Malignant neoplasm of rectosigmoid junction: Secondary | ICD-10-CM

## 2019-06-14 DIAGNOSIS — E860 Dehydration: Secondary | ICD-10-CM | POA: Diagnosis not present

## 2019-06-14 LAB — CMP (CANCER CENTER ONLY)
ALT: 26 U/L (ref 0–44)
AST: 53 U/L — ABNORMAL HIGH (ref 15–41)
Albumin: 2.2 g/dL — ABNORMAL LOW (ref 3.5–5.0)
Alkaline Phosphatase: 652 U/L — ABNORMAL HIGH (ref 38–126)
Anion gap: 13 (ref 5–15)
BUN: 11 mg/dL (ref 6–20)
CO2: 26 mmol/L (ref 22–32)
Calcium: 6.7 mg/dL — ABNORMAL LOW (ref 8.9–10.3)
Chloride: 99 mmol/L (ref 98–111)
Creatinine: 0.81 mg/dL (ref 0.44–1.00)
GFR, Est AFR Am: >60 mL/min (ref >60)
GFR, Estimated: >60 mL/min (ref >60)
Glucose, Bld: 248 mg/dL — ABNORMAL HIGH (ref 70–99)
Potassium: 3.7 mmol/L (ref 3.5–5.1)
Sodium: 138 mmol/L (ref 135–145)
Total Bilirubin: 1 mg/dL (ref 0.3–1.2)
Total Protein: 6.9 g/dL (ref 6.5–8.1)

## 2019-06-14 LAB — CBC WITH DIFFERENTIAL (CANCER CENTER ONLY)
Abs Immature Granulocytes: 0.2 10*3/uL — ABNORMAL HIGH (ref 0.00–0.07)
Basophils Absolute: 0.1 10*3/uL (ref 0.0–0.1)
Basophils Relative: 1 %
Eosinophils Absolute: 0.1 10*3/uL (ref 0.0–0.5)
Eosinophils Relative: 0 %
HCT: 33 % — ABNORMAL LOW (ref 36.0–46.0)
Hemoglobin: 10.1 g/dL — ABNORMAL LOW (ref 12.0–15.0)
Immature Granulocytes: 1 %
Lymphocytes Relative: 7 %
Lymphs Abs: 1.6 10*3/uL (ref 0.7–4.0)
MCH: 25.8 pg — ABNORMAL LOW (ref 26.0–34.0)
MCHC: 30.6 g/dL (ref 30.0–36.0)
MCV: 84.2 fL (ref 80.0–100.0)
Monocytes Absolute: 1.3 10*3/uL — ABNORMAL HIGH (ref 0.1–1.0)
Monocytes Relative: 6 %
Neutro Abs: 18.6 10*3/uL — ABNORMAL HIGH (ref 1.7–7.7)
Neutrophils Relative %: 85 %
Platelet Count: 316 10*3/uL (ref 150–400)
RBC: 3.92 MIL/uL (ref 3.87–5.11)
RDW: 17.3 % — ABNORMAL HIGH (ref 11.5–15.5)
WBC Count: 21.9 10*3/uL — ABNORMAL HIGH (ref 4.0–10.5)
nRBC: 0 % (ref 0.0–0.2)

## 2019-06-14 LAB — MAGNESIUM: Magnesium: 1.3 mg/dL — CL (ref 1.7–2.4)

## 2019-06-14 MED ORDER — HEPARIN SOD (PORK) LOCK FLUSH 100 UNIT/ML IV SOLN
500.0000 [IU] | Freq: Once | INTRAVENOUS | Status: AC
  Administered 2019-06-14: 500 [IU]
  Filled 2019-06-14: qty 5

## 2019-06-14 MED ORDER — SODIUM CHLORIDE 0.9 % IV SOLN
Freq: Once | INTRAVENOUS | Status: AC
  Filled 2019-06-14: qty 250

## 2019-06-14 MED ORDER — SODIUM CHLORIDE 0.9 % IV SOLN
Freq: Once | INTRAVENOUS | Status: DC
  Filled 2019-06-14: qty 250

## 2019-06-14 MED ORDER — MAGNESIUM SULFATE 4 GM/100ML IV SOLN
4.0000 g | Freq: Once | INTRAVENOUS | Status: AC
  Administered 2019-06-14: 4 g via INTRAVENOUS
  Filled 2019-06-14: qty 100

## 2019-06-14 MED ORDER — SODIUM CHLORIDE 0.9% FLUSH
10.0000 mL | Freq: Once | INTRAVENOUS | Status: AC
  Administered 2019-06-14: 10 mL
  Filled 2019-06-14: qty 10

## 2019-06-14 MED ORDER — ONDANSETRON 8 MG PO TBDP: 8.0000 mg | ORAL_TABLET | Freq: Three times a day (TID) | ORAL | 1 refills | Status: AC | PRN

## 2019-06-14 MED ORDER — MAGNESIUM SULFATE 4 GM/100ML IV SOLN
4.0000 g | Freq: Once | INTRAVENOUS | Status: DC
  Filled 2019-06-14: qty 100

## 2019-06-14 NOTE — Progress Notes (Signed)
[place]   Telephone:([phone] Fax:([phone]) [phone]   Clinic Follow up Note   Patient Care Team: Esaw Grandchild, NP as PCP - General (Family Medicine) Delrae Rend, MD as Consulting Physician (Endocrinology) Truitt Merle, MD as Consulting Physician (Hematology) Alla Feeling, NP as Nurse Practitioner (Nurse Practitioner) Clarene Essex, MD as Consulting Physician (Gastroenterology) 06/14/2019  CHIEF COMPLAINT: F/u colon cancer, goals of care discussion    SUMMARY OF ONCOLOGIC HISTORY: Oncology History Overview Note  Cancer Staging Malignant neoplasm of rectosigmoid junction [facility]) Staging form: Colon and Rectum, AJCC 8th Edition - Clinical stage from 06/11/2017: Stage IVA (cTX, cN1b, pM1a) - Signed by Truitt Merle, MD on 06/15/2017     Malignant neoplasm of rectosigmoid junction ([place])  05/30/2017 Imaging   CT AP IMPRESSION: 1. 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 Abdominopelvic adenopathy. Rectosigmoid mass with suggestion of partial obstruction as evidenced by large colonic stool burden more proximally. 2.  Aortic Atherosclerosis (ICD10-I70.0).  This is age advanced.   05/31/2017 Tumor Marker   CEA 353.3 (elevated) AFP 4.5 (normal)   05/31/2017 Initial Biopsy   Diagnosis Colon, biopsy, sigmoid - INVASIVE ADENOCARCINOMA - SEE COMMENT   05/31/2017 Imaging   CT CHEST IMPRESSION: 1. No evidence of metastatic disease in the chest. 2. No acute findings.  Aortic Atherosclerosis (ICD10-I70.0).    05/31/2017 Procedure   Colonoscopy per Dr. Watt Climes Findings: An infiltrative and ulcerated partially obstructing medium-sized mass was found in the recto-sigmoid colon. The mass was circumferential. The mass measured four cm in length. No bleeding was present.   Impression - One small polyp in the rectum. - The examination was otherwise normal. - Malignant partially obstructing tumor in the  recto-sigmoid colon. Biopsied. - One medium polyp in the proximal sigmoid colon. - The examination was otherwise normal. - Internal hemorrhoids.   06/03/2017 Initial Diagnosis   Malignant neoplasm of transverse colon ([place])   06/11/2017 Cancer Staging   Staging form: Colon and Rectum, AJCC 8th Edition - Clinical stage from 06/11/2017: Stage IVA (cTX, cN1b, pM1a) - Signed by Truitt Merle, MD on 06/15/2017   06/11/2017 Pathology Results   Liver biopsy confirmed metastatic colon cancer   07/23/2017 Imaging   CT CAP IMPRESSION: 1. Mild progression of hepatic metastasis. 2. Similar rectosigmoid primary with abdominopelvic nodal metastasis. 3.  No acute process or evidence of metastatic disease in the chest. 4. Aortic Atherosclerosis (ICD10-I70.0). Coronary artery atherosclerosis. This is age advanced. 5. Proximal colonic constipation, again suggesting a component of partial obstruction at the primary site. 6. Mild ascending aortic dilatation at 4.1 cm.   08/25/2017 - 05/10/2018 Chemotherapy   -Xeloda '1500mg'$  BID 1 week on and 1 week off starting 08/25/17. Due to her worsening hand-foot syndrome her dose was reduced to '1500mg'$  in the AM and '1000mg'$  in the PM. Due to disease progression we swithced her to CAPOX  -Vectibix every 2 weeks starting 08/25/17. Due to skin rash will stop starting 05/10/18.     12/14/2017 Imaging   IMPRESSION: 1. Response to therapy. Marked decrease in hepatic metastatic burden. More mild decrease in abdominopelvic adenopathy and definition of rectosigmoid primary. 2.  No acute process or evidence of metastatic disease in the chest. 3.  Aortic Atherosclerosis (ICD10-I70.0).    04/28/2018 Imaging   CT CAP 04/28/18  IMPRESSION: 1. Index liver metastases measured on the previous study show no substantial interval change although new liver lesions on today's exam are concerning for progressive disease. 2. Mild lymphadenopathy  in the upper abdomen is stable. 3. Persistent mild  ill-defined wall thickening in the distal sigmoid colon near the rectosigmoid junction. 4.  Aortic Atherosclerois (ICD10-170.0)    05/17/2018 - 12/02/2018 Chemotherapy   CAPOX every 3 weeks with Xeloda '1500mg'$  BID 2 weeks on/1week off starting 05/17/18 -Add Avastin to first cycle.    09/09/2018 Imaging   CT CAP W Contrast IMPRESSION: 1. Slight interval decrease in size one of the right hepatic lobe lesions. Additional lesions within the liver are similar when compared to recent prior exam. 2. Mild adenopathy within the abdomen is stable. 3. Similar-appearing thick walled distal sigmoid colon.   12/20/2018 Imaging   CT CAP W Contrast  IMPRESSION: 1. Increase in size and number of hepatic metastatic lesions compared to the prior study. 2. Stable adenopathy in the abdomen. 3. Signs of sigmoid wall thickening as before.   01/04/2019 - 01/28/2019 Chemotherapy   Irinotecan and Avastin q2weeks starting 01/04/19.  Stoppes due to disease progression.    03/08/2019 Imaging   CT CAP W Contrast  IMPRESSION: 1. Interval progression of liver metastasis. 2. Stable borderline enlarged upper abdominal lymph node. 3. Aortic atherosclerosis.   Aortic Atherosclerosis (ICD10-I70.0).   04/04/2019 -  Chemotherapy   FOLFIRI and Vectibix q2weeks starting 04/04/19     CURRENT THERAPY:  Irinotecanand Vectibix q2weeks starting 04/04/19.50% 5FU with C2 only. Will add Xeloda '1000mg'$  BID 1 week on/1 week off starting with C4.  INTERVAL HISTORY: Beverly Schwartz returns for f/u and supportive care as scheduled. She is here with her mother. She notes for the past week she has had low appetite and fatigue, not out of bed much except bathroom. She drinks 3 cups of liquid daily. She has mild nausea, no vomiting. Didn't take anti-emetics due to potential side effects (dizziness). She has abdominal bloating but not much pain. Denies constipation or diarrhea. Denies fever, chills, cough, chest pain, dyspnea, leg swelling,  rash, mucositis, dark urine, dysuria, or other changes from baseline. Her sinus drainage resolved.    MEDICAL HISTORY:  Past Medical History:  Diagnosis Date  . Anxiety   . Cancer ([place])    colon, liver  . Chest pain 10/18/2018   Patient c/o chest pain right side yesterday intermitten lasting 3-4 hours.  [name], PA notified  . Colon cancer ([place])   . Complication of anesthesia   . Depression   . Diabetes mellitus   . Dizziness   . Family history of breast cancer   . Family history of stomach cancer   . Hyperlipidemia   . Hypertension   . Hypothyroidism   . Obesity   . PONV (postoperative nausea and vomiting)   . Tachycardia     SURGICAL HISTORY: Past Surgical History:  Procedure Laterality Date  . FLEXIBLE SIGMOIDOSCOPY N/A 05/31/2017   Procedure: FLEXIBLE SIGMOIDOSCOPY;  Surgeon: Clarene Essex, MD;  Location: [facility] ENDOSCOPY;  Service: Endoscopy;  Laterality: N/A;  . IR IMAGING GUIDED PORT INSERTION  10/19/2018  . WISDOM TOOTH EXTRACTION     age 65's    I have reviewed the social history and family history with the patient and they are unchanged from previous note.  ALLERGIES:  is allergic to bupropion and amoxicillin.  MEDICATIONS:  Current Outpatient Medications  Medication Sig Dispense Refill  . acetaminophen-codeine (TYLENOL #3) 300-30 MG tablet Take 1 tablet by mouth every 6 (six) hours as needed for moderate pain. 30 tablet 0  . ALPRAZolam (XANAX) 1 MG tablet TAKE 1 TABLET BY MOUTH AT BEDTIME  AS NEEDED FOR ANXIETY (Patient taking differently: Take 1 mg by mouth at bedtime as needed for anxiety. ) 30 tablet 0  . carbamide peroxide (DEBROX) 6.5 % OTIC solution Place 5 drops into both ears 2 (two) times daily. 15 mL 0  . CINNAMON PO Take 1 tablet by mouth daily.     . clindamycin (CLINDAGEL) 1 % gel Apply topically 2 (two) times daily. 30 g 2  . fluticasone (FLONASE) 50 MCG/ACT nasal spray Place 1 spray into both nostrils daily. (Patient taking differently: Place 1  spray into both nostrils daily as needed for allergies. ) 16 g 2  . insulin NPH-regular Human (NOVOLIN 70/30) (70-30) 100 UNIT/ML injection INJECT 10 UNITS WITH BREAKFAST, 10 UNITS WITH DINNER (Patient taking differently: Inject 10 Units into the skin 2 (two) times daily with a meal. ) 10 mL 2  . Insulin Syringe-Needle U-100 (BD INSULIN SYRINGE U/F) 31G X 5/16" 1 ML MISC Inject twice a day as directed 200 each 3  . levothyroxine (SYNTHROID) 112 MCG tablet TAKE 1 TABLET BY MOUTH DAILY BEFORE BREAKFAST. LABS REQUIRED PRIOR TO ANY FURTHER REFILLS. 15 tablet 0  . lidocaine-prilocaine (EMLA) cream Apply 1 application topically as needed. 30 g 1  . lisinopril (ZESTRIL) 5 MG tablet TAKE 1 TABLET BY MOUTH EVERY DAY 90 tablet 0  . magnesium oxide (MAG-OX) 400 (241.3 Mg) MG tablet Take 1 tablet (400 mg total) by mouth daily. (Patient taking differently: Take 400 mg by mouth in the morning, at noon, and at bedtime. ) 90 tablet 1  . magnesium oxide (MAG-OX) 400 MG tablet Take 400 mg by mouth 2 (two) times daily.    . mometasone (ELOCON) 0.1 % cream Apply 1 application topically daily as needed.    . Omega-3 Fatty Acids (FISH OIL) 1000 MG CAPS Take 1,000 mg by mouth daily.     . polyethylene glycol (MIRALAX / GLYCOLAX) packet Take 17 g by mouth daily. 14 each 0  . TURMERIC PO Take 1 capsule by mouth daily.     Marland Kitchen albuterol (PROVENTIL HFA;VENTOLIN HFA) 108 (90 Base) MCG/ACT inhaler Inhale 2 puffs into the lungs every 6 (six) hours as needed for wheezing or shortness of breath. 1 Inhaler 0  . amLODipine (NORVASC) 10 MG tablet TAKE 1 TABLET BY MOUTH EVERY DAY 30 tablet 0  . benzonatate (TESSALON) 100 MG capsule Take 1 capsule (100 mg total) by mouth 3 (three) times daily as needed for cough. 30 capsule 0  . capecitabine (XELODA) 500 MG tablet Take 2 tablets (1,000 mg total) by mouth 2 (two) times daily after a meal. Take for 7 days, then hold for 7 days. Repeat every 14 days. 56 tablet 0  . lidocaine (XYLOCAINE) 2 %  solution     . ondansetron (ZOFRAN ODT) 8 MG disintegrating tablet Take 1 tablet (8 mg total) by mouth every 8 (eight) hours as needed for nausea or vomiting. 20 tablet 1   Current Facility-Administered Medications  Medication Dose Route Frequency Provider Last Rate Last Admin  . 0.9 %  sodium chloride infusion   Intravenous Once Cira Rue K, NP      . magnesium sulfate IVPB 4 g 100 mL  4 g Intravenous Once Alla Feeling, NP       Facility-Administered Medications Ordered in Other Visits  Medication Dose Route Frequency Provider Last Rate Last Admin  . 0.9 %  sodium chloride infusion   Intravenous Continuous Truitt Merle, MD   Stopped at 11/11/18 1330  .  0.9 %  sodium chloride infusion   Intravenous Once Alla Feeling, NP 500 mL/hr at 06/14/19 1323 New Bag at 06/14/19 1323  . heparin lock flush 100 unit/mL  500 Units Intracatheter Once Truitt Merle, MD      . magnesium sulfate IVPB 4 g 100 mL  4 g Intravenous Once Alla Feeling, NP 50 mL/hr at 06/14/19 1331 4 g at 06/14/19 1331  . sodium chloride flush (NS) 0.9 % injection 10 mL  10 mL Intracatheter Once Truitt Merle, MD        PHYSICAL EXAMINATION: ECOG PERFORMANCE STATUS: 3 - Symptomatic, >50% confined to bed  Vitals:   06/14/19 1220  BP: 107/76  Pulse: (!) 130  Resp: 17  Temp: 98 F (36.7 C)  SpO2: 100%   Filed Weights   06/14/19 1220  Weight: 146 lb 12.8 oz (66.6 kg)    GENERAL:alert, no distress and comfortable SKIN: dry skin, poor turgor over the forearm. No significant rash  EYES: sclera clear LUNGS: clear with normal breathing effort, no wheezes HEART: tachycardic, regular rhythm; no audible murmur, no significant lower extremity edema ABDOMEN:abdomen soft, non-tender and normal bowel sounds. Palpable liver with mild tenderness  NEURO: awake but drowsy & oriented x 3 with fluent speech, normal gait PAC without erythema   LABORATORY DATA:  I have reviewed the data as listed CBC Latest Ref Rng & Units 06/14/2019  06/06/2019 05/19/2019  WBC 4.0 - 10.5 K/uL 21.9(H) 11.1(H) 11.4(H)  Hemoglobin 12.0 - 15.0 g/dL 10.1(L) 11.1(L) 12.7  Hematocrit 36.0 - 46.0 % 33.0(L) 35.7(L) 41.2  Platelets 150 - 400 K/uL 316 459(H) 313     CMP Latest Ref Rng & Units 06/14/2019 06/06/2019 05/19/2019  Glucose 70 - 99 mg/dL 248(H) 185(H) 162(H)  BUN 6 - 20 mg/dL 11 4(L) 10  Creatinine 0.44 - 1.00 mg/dL 0.81 0.72 0.73  Sodium 135 - 145 mmol/L 138 147(H) 142  Potassium 3.5 - 5.1 mmol/L 3.7 3.8 3.8  Chloride 98 - 111 mmol/L 99 105 103  CO2 22 - 32 mmol/L '26 26 27  '$ Calcium 8.9 - 10.3 mg/dL 6.7(L) 5.8(LL) 6.8(L)  Total Protein 6.5 - 8.1 g/dL 6.9 6.7 7.3  Total Bilirubin 0.3 - 1.2 mg/dL 1.0 0.4 0.6  Alkaline Phos 38 - 126 U/L 652(H) 366(H) 296(H)  AST 15 - 41 U/L 53(H) 59(H) 32  ALT 0 - 44 U/L '26 26 17      '$ RADIOGRAPHIC STUDIES: I have personally reviewed the radiological images as listed and agreed with the findings in the report. No results found.   ASSESSMENT & PLAN: Beverly Arreaga Knightis a 49 y.o.femalewith   1. Sigmoid adenocarcinoma, with abdominopelvic adenopathy andhepatic metastasis, stage IV, MSI-stable , KRAS/NRAS/BRAF wild type -Diagnosed in 05/2017. Patient declined intensive chemo regiments previously due to fear of side effects.She progressed on first lineXelodaandVectibixafter 8 months of therapy. She progressed on second line CAPOX and avastin, she did not tolerate well due to hand/foot syndrome and neuropathy. -She had PAC placed on 10/19/18. -She started third line irinotecan and avastin on 10/13, patient declined starting 5FU while she moved into new house on her mother's land. She received 2 doses chemo before restaging on 03/08/19 showed interval progression of liver mets and stable to borderline abd LNs -Hospitalized for syncope, confusion, n/v/ and pain. CT on 03/27/19 showed progression of liver metastasis and new and progressive adenopathy, CEA is markedly elevated to 1277 which is consistent  with progression.  -She started FOLFIRI with 5FU pump over  24 hours (1/2 dose) but did not tolerate well due to fatigue and nausea and did not want to retry -S/p 4 cycles irinotecan (dose reduced with C4) and panitumumab, She added low dose Xeloda 1000 mg BID 1 week on and 1 week off starting 05/19/19 with C4 but only took for a few days. She had nausea, decreased po intake with weight loss, and diarrhea.  -She had delayed recovery, treatment was held on 3/15 due to hypomagnesemia. She stayed in bed most of the last week due to fatigue and nausea.  -Overall, Beverly Schwartz is not doing well. Its unclear that her symptoms are related to chemo since last dose was 4 weeks ago. Her fatigue may be related to hypomagnesemia (1.3 today) and electrolyte abnormalities (corrected Ca 8.1 today) and dehydration. Given her physical exam, suboptimal chemo, and overall declining PS I feel this most likely represents disease progression -we had lengthy goals of care discussion. She and her mother are interested in palliative care service. I placed urgent referral today. Although she is hospice appropriate, she is not ready. She wishes to try more chemo if she is a candidate.  -Therefore I recommend IVF + Mg today and 3/26 to correct electrolytes and improve her dehydration. Will repeat labs including CBC, CMP, Mg, and CEA. LFTs are risings, likely due to liver metastasis. No clinical signs of juandice. WBC also elevated, without clinical signs of infection.  -Will see if pharmacy can recommend oral formulations of Mg and Ca that she can tolerate.  -She will return for f/u and further goals of care/treatment discussion with Dr. Burr Medico next week.  -I notified Dr. Burr Medico of the plan.   2. DM, HTN, HL -HTN improved after she came off Avastin. BP 107/76 today -Hold anti-hypertensive meds for now   3. Financial and Social issues, Anxiety, depression -followed by SW and Chaplain -She is followed by mental health provider Art Buff -On Xanax PRN which is helpful, she is requiring less now that she lives close to her mother  Nunzio Cory regained insurancewithBlue cross insurancein 2020 -Followed by Lorrin Jackson, chaplain for emotional and spiritual support   4.Goal of care discussion -She understands the goal of care is palliative, her cancer is incurable. The goal of therapy is to prolong her life and improve her quality of life. -She is full code for now  -We had lengthy discussion today. She understands she has not tolerated intensive chemo well lately. She wishes to try more chemo if her PS improves and she feels better.  -I reviewed palliative care vs hospice and the goals/mission of each. Patient and her mother agree to palliative care, I placed urgent referral. She is not ready for hospice yet.  -F/u with Dr. Burr Medico next week to discuss treatment and [place]  -I encouraged Beverly Schwartz to continue with her therapist and seek guidance to discuss her condition with her 8th grade Son, she agrees.  -continuing to follow   5.Insomnia, anxiety  -improved  6. 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 -not discussed today  7. Skin acne rash, secondary to Vectibix  -mild on her face and neck, moderate on chest  -She hasOTC hydrocortisone cream and Clindamycin gel which I refilled today -I encouraged her to use thick lotion on her hands once she starts Xeloda and monitor for hand/foot syndrome -mild; not discussed today  8. Iron deficiency  -Ferritin 106 on 05/02/19, she is not on oral iron. I encouraged her to take 1 tab MWF if she can tolerate.  -taking meds is difficult, can hold on this for now if necessary   PLAN: -Labs reviewed -1 L NS + 4 g Mg IVPB today -Repeat vitals after NS -Goals of care discussion  -Lab and IVF (+ Mg)  on 3/26 -GOC/treatment discussion with Dr. Burr Medico next week, possible treatment if PS improves  -start Natures Bounty coated Mag 400 mg BID and calcium acetate capsules (667 mg) once daily found at [facility]: zofran ODT 8 mg q8 PRN   Orders Placed This Encounter  Procedures  . Amb Referral to Palliative Care    Referral Priority:   Urgent    Referral Type:   Consultation    Number of Visits Requested:   1   All questions were answered. The patient knows to call the clinic with any problems, questions or concerns. No barriers to learning were detected. I spent a total of 40 minutes in today's encounter.     Alla Feeling, NP 06/14/19

## 2019-06-14 NOTE — Progress Notes (Signed)
Critical Value of Magnesium 1.3 reported and called result to Doristine Section, RN who is supporting Cira Rue, NP.  Orders for Mag 4 gm written by NP. Gardiner Rhyme, RN

## 2019-06-14 NOTE — Progress Notes (Signed)
Beverly Schwartz Spiritual Care Note  Received and returned call from Beverly Schwartz for support and already had calling Beverly Schwartz on my calendar today, so I met up with them together prior to Beverly Schwartz's appointment with Beverly Schwartz/NP. They reported that Beverly Schwartz had more energy and appetite today than she has had in a week or two, though that energy was waning toward the end of our encounter. They verbalized curiosity about and interest in home-based palliative care, especially to have periodic nursing eyes on Beverly Schwartz, given how poorly she has been doing/feeling in the past weeks; passed this info on to Beverly Schwartz. Beverly Schwartz plans to follow up by phone, and I plan to call Beverly Schwartz next week or see her when she is on campus.  [place], [place], [facility] Pager [phone] Voicemail [phone]

## 2019-06-14 NOTE — Patient Instructions (Signed)
Dehydration, Adult Dehydration is condition in which there is not enough water or other fluids in the body. This happens when a person loses more fluids than he or she takes in. Important body parts cannot work right without the right amount of fluids. Any loss of fluids from the body can cause dehydration. Dehydration can be mild, worse, or very bad. It should be treated right away to keep it from getting very bad. What are the causes? This condition may be caused by:  Conditions that cause loss of water or other fluids, such as: ? Watery poop (diarrhea). ? Vomiting. ? Sweating a lot. ? Peeing (urinating) a lot.  Not drinking enough fluids, especially when you: ? Are ill. ? Are doing things that take a lot of energy to do.  Other illnesses and conditions, such as fever or infection.  Certain medicines, such as medicines that take extra fluid out of the body (diuretics).  Lack of safe drinking water.  Not being able to get enough water and food. What increases the risk? The following factors may make you more likely to develop this condition:  Having a long-term (chronic) illness that has not been treated the right way, such as: ? Diabetes. ? Heart disease. ? Kidney disease.  Being 65 years of age or older.  Having a disability.  Living in a place that is high above the ground or sea (high in altitude). The thinner, dried air causes more fluid loss.  Doing exercises that put stress on your body for a long time. What are the signs or symptoms? Symptoms of dehydration depend on how bad it is. Mild or worse dehydration  Thirst.  Dry lips or dry mouth.  Feeling dizzy or light-headed, especially when you stand up from sitting.  Muscle cramps.  Your body making: ? Dark pee (urine). Pee may be the color of tea. ? Less pee than normal. ? Less tears than normal.  Headache. Very bad dehydration  Changes in skin. Skin may: ? Be cold to the touch (clammy). ? Be blotchy  or pale. ? Not go back to normal right after you lightly pinch it and let it go.  Little or no tears, pee, or sweat.  Changes in vital signs, such as: ? Fast breathing. ? Low blood pressure. ? Weak pulse. ? Pulse that is more than 100 beats a minute when you are sitting still.  Other changes, such as: ? Feeling very thirsty. ? Eyes that look hollow (sunken). ? Cold hands and feet. ? Being mixed up (confused). ? Being very tired (lethargic) or having trouble waking from sleep. ? Short-term weight loss. ? Loss of consciousness. How is this treated? Treatment for this condition depends on how bad it is. Treatment should start right away. Do not wait until your condition gets very bad. Very bad dehydration is an emergency. You will need to go to a hospital.  Mild or worse dehydration can be treated at home. You may be asked to: ? Drink more fluids. ? Drink an oral rehydration solution (ORS). This drink helps get the right amounts of fluids and salts and minerals in the blood (electrolytes).  Very bad dehydration can be treated: ? With fluids through an IV tube. ? By getting normal levels of salts and minerals in your blood. This is often done by giving salts and minerals through a tube. The tube is passed through your nose and into your stomach. ? By treating the root cause. Follow these instructions at   home: Oral rehydration solution If told by your doctor, drink an ORS:  Make an ORS. Use instructions on the package.  Start by drinking small amounts, about  cup (120 mL) every 5-10 minutes.  Slowly drink more until you have had the amount that your doctor said to have. Eating and drinking         Drink enough clear fluid to keep your pee pale yellow. If you were told to drink an ORS, finish the ORS first. Then, start slowly drinking other clear fluids. Drink fluids such as: ? Water. Do not drink only water. Doing that can make the salt (sodium) level in your body get too  low. ? Water from ice chips you suck on. ? Fruit juice that you have added water to (diluted). ? Low-calorie sports drinks.  Eat foods that have the right amounts of salts and minerals, such as: ? Bananas. ? Oranges. ? Potatoes. ? Tomatoes. ? Spinach.  Do not drink alcohol.  Avoid: ? Drinks that have a lot of sugar. These include:  High-calorie sports drinks.  Fruit juice that you did not add water to.  Soda.  Caffeine. ? Foods that are greasy or have a lot of fat or sugar. General instructions  Take over-the-counter and prescription medicines only as told by your doctor.  Do not take salt tablets. Doing that can make the salt level in your body get too high.  Return to your normal activities as told by your doctor. Ask your doctor what activities are safe for you.  Keep all follow-up visits as told by your doctor. This is important. Contact a doctor if:  You have pain in your belly (abdomen) and the pain: ? Gets worse. ? Stays in one place.  You have a rash.  You have a stiff neck.  You get angry or annoyed (irritable) more easily than normal.  You are more tired or have a harder time waking than normal.  You feel: ? Weak or dizzy. ? Very thirsty. Get help right away if you have:  Any symptoms of very bad dehydration.  Symptoms of vomiting, such as: ? You cannot eat or drink without vomiting. ? Your vomiting gets worse or does not go away. ? Your vomit has blood or green stuff in it.  Symptoms that get worse with treatment.  A fever.  A very bad headache.  Problems with peeing or pooping (having a bowel movement), such as: ? Watery poop that gets worse or does not go away. ? Blood in your poop (stool). This may cause poop to look black and tarry. ? Not peeing in 6-8 hours. ? Peeing only a small amount of very dark pee in 6-8 hours.  Trouble breathing. These symptoms may be an emergency. Do not wait to see if the symptoms will go away. Get  medical help right away. Call your local emergency services (911 in the [place] Do not drive yourself to the hospital. Summary  Dehydration is a condition in which there is not enough water or other fluids in the body. This happens when a person loses more fluids than he or she takes in.  Treatment for this condition depends on how bad it is. Treatment should be started right away. Do not wait until your condition gets very bad.  Drink enough clear fluid to keep your pee pale yellow. If you were told to drink an oral rehydration solution (ORS), finish the ORS first. Then, start slowly drinking other clear fluids.    Take over-the-counter and prescription medicines only as told by your doctor.  Get help right away if you have any symptoms of very bad dehydration. This information is not intended to replace advice given to you by your health care provider. Make sure you discuss any questions you have with your health care provider. Document Revised: 10/21/2018 Document Reviewed: 10/21/2018 Elsevier Patient Education  2020 [employer]    Hypomagnesemia Hypomagnesemia is a condition in which the level of magnesium in the blood is low. Magnesium is a mineral that is found in many foods. It is used in many different processes in the body. Hypomagnesemia can affect every organ in the body. In severe cases, it can cause life-threatening problems. What are the causes? This condition may be caused by:  Not getting enough magnesium in your diet.  Malnutrition.  Problems with absorbing magnesium from the intestines.  Dehydration.  Alcohol abuse.  Vomiting.  Severe or chronic diarrhea.  Some medicines, including medicines that make you urinate more (diuretics).  Certain diseases, such as kidney disease, diabetes, celiac disease, and overactive thyroid. What are the signs or symptoms? Symptoms of this condition include:  Loss of appetite.  Nausea and vomiting.  Involuntary shaking or  trembling of a body part (tremor).  Muscle weakness.  Tingling in the arms and legs.  Sudden tightening of muscles (muscle spasms).  Confusion.  Psychiatric issues, such as depression, irritability, or psychosis.  A feeling of fluttering of the heart.  Seizures. These symptoms are more severe if magnesium levels drop suddenly. How is this diagnosed? This condition may be diagnosed based on:  Your symptoms and medical history.  A physical exam.  Blood and urine tests. How is this treated? Treatment depends on the cause and the severity of the condition. It may be treated with:  A magnesium supplement. This can be taken in pill form. If the condition is severe, magnesium is usually given through an IV.  Changes to your diet. You may be directed to eat foods that have a lot of magnesium, such as green leafy vegetables, peas, beans, and nuts.  Stopping any intake of alcohol. Follow these instructions at home:      Make sure that your diet includes foods with magnesium. Foods that have a lot of magnesium in them include: ? Green leafy vegetables, such as spinach and broccoli. ? Beans and peas. ? Nuts and seeds, such as almonds and sunflower seeds. ? Whole grains, such as whole grain bread and fortified cereals.  Take magnesium supplements if your health care provider tells you to do that. Take them as directed.  Take over-the-counter and prescription medicines only as told by your health care provider.  Have your magnesium levels monitored as told by your health care provider.  When you are active, drink fluids that contain electrolytes.  Avoid drinking alcohol.  Keep all follow-up visits as told by your health care provider. This is important. Contact a health care provider if:  You get worse instead of better.  Your symptoms return. Get help right away if you:  Develop severe muscle weakness.  Have trouble breathing.  Feel that your heart is  racing. Summary  Hypomagnesemia is a condition in which the level of magnesium in the blood is low.  Hypomagnesemia can affect every organ in the body.  Treatment may include eating more foods that contain magnesium, taking magnesium supplements, and not drinking alcohol.  Have your magnesium levels monitored as told by your health care provider. This   information is not intended to replace advice given to you by your health care provider. Make sure you discuss any questions you have with your health care provider. Document Revised: 02/20/2017 Document Reviewed: 02/09/2017 Elsevier Patient Education  2020 Elsevier Inc.  

## 2019-06-15 ENCOUNTER — Telehealth: Payer: Self-pay | Admitting: Nurse Practitioner

## 2019-06-15 NOTE — Telephone Encounter (Signed)
Scheduled appt per 3/23 los.  Left a vm of the appt date and time.

## 2019-06-15 NOTE — Telephone Encounter (Signed)
Called patient to offer to schedule the Palliative Consult, no answer - message left with reason for call along with my name and contact number.

## 2019-06-16 ENCOUNTER — Ambulatory Visit: Payer: BC Managed Care – PPO

## 2019-06-16 ENCOUNTER — Ambulatory Visit: Payer: BC Managed Care – PPO | Admitting: Nurse Practitioner

## 2019-06-16 ENCOUNTER — Other Ambulatory Visit: Payer: BC Managed Care – PPO

## 2019-06-16 ENCOUNTER — Other Ambulatory Visit: Payer: Self-pay | Admitting: Hematology

## 2019-06-16 ENCOUNTER — Encounter: Payer: BC Managed Care – PPO | Admitting: Nutrition

## 2019-06-16 DIAGNOSIS — I10 Essential (primary) hypertension: Secondary | ICD-10-CM

## 2019-06-16 DIAGNOSIS — C19 Malignant neoplasm of rectosigmoid junction: Secondary | ICD-10-CM

## 2019-06-17 ENCOUNTER — Ambulatory Visit: Payer: BC Managed Care – PPO

## 2019-06-17 ENCOUNTER — Encounter: Payer: Self-pay | Admitting: Nurse Practitioner

## 2019-06-17 ENCOUNTER — Other Ambulatory Visit: Payer: BC Managed Care – PPO

## 2019-06-17 NOTE — Progress Notes (Signed)
Counseling Intern called to check-in on patient on 06/17/19. Pt did not answer the phone and CI left a voicemail message encouraging pt to reach out as needed. CI will call pt on Monday, 06/20/19 at 10:00am to attempt to check-in again.   Art Buff Villanueva Counseling Intern Voicemail:  [phone]

## 2019-06-17 NOTE — Progress Notes (Signed)
Pharmacist Chemotherapy Monitoring - Follow Up Assessment    I verify that I have reviewed each item in the below checklist:  . Regimen for the patient is scheduled for the appropriate day and plan matches scheduled date. Marland Kitchen Appropriate non-routine labs are ordered dependent on drug ordered. . If applicable, additional medications reviewed and ordered per protocol based on lifetime cumulative doses and/or treatment regimen.   Plan for follow-up and/or issues identified: Yes . I-vent associated with next due treatment: Yes . MD and/or nursing notified: No    Kennith Center, Pharm.D., CPP 06/17/2019@1 :03 PM

## 2019-06-20 ENCOUNTER — Telehealth: Payer: Self-pay

## 2019-06-20 ENCOUNTER — Encounter: Payer: Self-pay | Admitting: Nurse Practitioner

## 2019-06-20 ENCOUNTER — Ambulatory Visit: Payer: BC Managed Care – PPO

## 2019-06-20 ENCOUNTER — Telehealth: Payer: Self-pay | Admitting: Internal Medicine

## 2019-06-20 NOTE — Telephone Encounter (Signed)
VM left to confirm time for scheduled visit with Beverly Kidney NP on Thursday @ 1pm will still work for patient. Noted that patient has other appointments scheduled on 06/23/19.

## 2019-06-20 NOTE — Telephone Encounter (Signed)
Received vm from Carolynn Comment, [place] mother stating that Palliative care has not contacted them as of yet.  I called [facility] palliative care services and left a vm as well as faxing referral and las ov to them.  I left vm for Ms winn telling her this.

## 2019-06-20 NOTE — Progress Notes (Signed)
Counseling intern(CI) spoke to patient on 06/20/2019 for a telehealth counseling session via phone. Counseling intern provided space for pt to open up about experiences and responded with empathy, compassion, and normalization of feelings. Pt presented to counseling with a restricted affect and depressed mood.  Pt reported she is experiencing significant back pain, which she suspects might be caused by a kidney stone. She reported that she takes a prescribed pain medication and CBD gummies to help with the pain. Pt stated she will speak with her doctor about this pain at her appointment this week. Pt stated that she has friends and family coming to visit her this week, but she worries she will not feel up for the company. Pt is conflicted about going forward with more chemotherapy because of the significant side effects she has reported experiencing. Pt also reported conflicted feelings about going to any appointments at [facility], stating that last time she was there for 7 hours and this week her instinct is that she "doesn't want to do it" because the appointments either make her feel worse or not any better. Pt suspects the chemo has lowered her magnesium level and that the infusions don't help. CI reflected that it is difficult for the pt to go anywhere right now because of the pain and fatigue she is experiencing. Pt looks forward to her son coming for a week while he is on spring break. She reports that her son is aware of her health problems, but they have not spoken in detail. Pt notes her mother is distressed, but recognized that "there is nothing I can do" to help her feel better. Pt and CI discussed how a parent naturally worries about their child when they are sick.   Pt and CI decided to cut the session short because the pt was experiencing significant pain. CI will call the pt on Thursday, 06/23/19, to follow-up and check-in on pt.   Art Buff [place]anchester Counseling Intern Voicemail:  [phone]

## 2019-06-20 NOTE — Telephone Encounter (Signed)
Phone call placed to confirm scheduled visit time as patient is scheduled for other appointments on Thursday 06/23/19. Unable to leave a VM as mailbox is full.

## 2019-06-21 ENCOUNTER — Encounter: Payer: Self-pay | Admitting: Nurse Practitioner

## 2019-06-21 ENCOUNTER — Other Ambulatory Visit: Payer: BC Managed Care – PPO

## 2019-06-21 ENCOUNTER — Ambulatory Visit: Payer: BC Managed Care – PPO | Admitting: Nurse Practitioner

## 2019-06-21 ENCOUNTER — Telehealth: Payer: Self-pay | Admitting: Nutrition

## 2019-06-21 ENCOUNTER — Inpatient Hospital Stay: Payer: BC Managed Care – PPO | Admitting: Nutrition

## 2019-06-21 ENCOUNTER — Ambulatory Visit: Payer: BC Managed Care – PPO

## 2019-06-21 ENCOUNTER — Telehealth: Payer: Self-pay | Admitting: Nurse Practitioner

## 2019-06-21 DIAGNOSIS — C19 Malignant neoplasm of rectosigmoid junction: Secondary | ICD-10-CM

## 2019-06-21 MED ORDER — HYDROCODONE-ACETAMINOPHEN 5-325 MG PO TABS: 1.0000 | ORAL_TABLET | Freq: Four times a day (QID) | ORAL | 0 refills | Status: AC | PRN

## 2019-06-21 NOTE — Telephone Encounter (Signed)
I called Beverly Schwartz in response to her Mychart messages. She has missed recent appointments for lab and fluids due to weakness. She had visitors lately which got her out of bed. She is not eating much but drinking protein shakes. Bowels moving without difficulty. She has increased pain in right back "near kidneys" and in her RUQ. Takes tylenol #3 at least once daily which is partially effective. She is asking for something stronger. Pain is 9/10. She denies bladder issues or dark urine. Will call in [place] for pain. She has palliative care consult and f/u here on 4/1. She agrees to come in. I encouraged her to increase activity and hydrate. Will obtain CT CAP this week to evaluate her disease. She understands the plan and appreciates the call.   Cira Rue, NP

## 2019-06-21 NOTE — Telephone Encounter (Signed)
Patient's infusion was canceled today.  I attempted to contact patient by telephone however not available.  Left a message with my name and phone number for return call as needed.

## 2019-06-22 ENCOUNTER — Telehealth: Payer: Self-pay | Admitting: Internal Medicine

## 2019-06-23 ENCOUNTER — Inpatient Hospital Stay (HOSPITAL_BASED_OUTPATIENT_CLINIC_OR_DEPARTMENT_OTHER): Payer: BC Managed Care – PPO | Admitting: Nurse Practitioner

## 2019-06-23 ENCOUNTER — Encounter: Payer: Self-pay | Admitting: Nurse Practitioner

## 2019-06-23 ENCOUNTER — Inpatient Hospital Stay: Payer: BC Managed Care – PPO

## 2019-06-23 ENCOUNTER — Ambulatory Visit (HOSPITAL_COMMUNITY)
Admission: RE | Admit: 2019-06-23 | Discharge: 2019-06-23 | Disposition: A | Discharge status: Home / Self Care | Payer: BC Managed Care – PPO | Source: Ambulatory Visit | Attending: Nurse Practitioner | Admitting: Nurse Practitioner

## 2019-06-23 ENCOUNTER — Other Ambulatory Visit: Payer: Self-pay

## 2019-06-23 ENCOUNTER — Inpatient Hospital Stay: Payer: BC Managed Care – PPO | Attending: Hematology

## 2019-06-23 VITALS — BP 125/93 | HR 123 | Temp 98.2°F | Resp 18 | Ht 65.0 in | Wt 150.0 lb

## 2019-06-23 DIAGNOSIS — I700 Atherosclerosis of aorta: Secondary | ICD-10-CM | POA: Diagnosis not present

## 2019-06-23 DIAGNOSIS — C187 Malignant neoplasm of sigmoid colon: Secondary | ICD-10-CM

## 2019-06-23 DIAGNOSIS — C787 Secondary malignant neoplasm of liver and intrahepatic bile duct: Secondary | ICD-10-CM | POA: Diagnosis not present

## 2019-06-23 DIAGNOSIS — E039 Hypothyroidism, unspecified: Secondary | ICD-10-CM | POA: Diagnosis not present

## 2019-06-23 DIAGNOSIS — I10 Essential (primary) hypertension: Secondary | ICD-10-CM | POA: Diagnosis not present

## 2019-06-23 DIAGNOSIS — Z79899 Other long term (current) drug therapy: Secondary | ICD-10-CM | POA: Diagnosis not present

## 2019-06-23 DIAGNOSIS — C19 Malignant neoplasm of rectosigmoid junction: Secondary | ICD-10-CM | POA: Diagnosis not present

## 2019-06-23 DIAGNOSIS — C772 Secondary and unspecified malignant neoplasm of intra-abdominal lymph nodes: Secondary | ICD-10-CM | POA: Diagnosis not present

## 2019-06-23 DIAGNOSIS — E119 Type 2 diabetes mellitus without complications: Secondary | ICD-10-CM | POA: Diagnosis not present

## 2019-06-23 DIAGNOSIS — D500 Iron deficiency anemia secondary to blood loss (chronic): Secondary | ICD-10-CM

## 2019-06-23 LAB — URINALYSIS, COMPLETE (UACMP) WITH MICROSCOPIC
Bilirubin Urine: NEGATIVE
Glucose, UA: 50 mg/dL — AB
Hgb urine dipstick: NEGATIVE
Ketones, ur: NEGATIVE mg/dL
Leukocytes,Ua: NEGATIVE
Nitrite: NEGATIVE
Protein, ur: 30 mg/dL — AB
Specific Gravity, Urine: 1.023 (ref 1.005–1.030)
pH: 5 (ref 5.0–8.0)

## 2019-06-23 LAB — CMP (CANCER CENTER ONLY)
ALT: 24 U/L (ref 0–44)
AST: 63 U/L — ABNORMAL HIGH (ref 15–41)
Albumin: 1.9 g/dL — ABNORMAL LOW (ref 3.5–5.0)
Alkaline Phosphatase: 995 U/L — ABNORMAL HIGH (ref 38–126)
Anion gap: 18 — ABNORMAL HIGH (ref 5–15)
BUN: 7 mg/dL (ref 6–20)
CO2: 23 mmol/L (ref 22–32)
Calcium: 7.4 mg/dL — ABNORMAL LOW (ref 8.9–10.3)
Chloride: 97 mmol/L — ABNORMAL LOW (ref 98–111)
Creatinine: 0.78 mg/dL (ref 0.44–1.00)
GFR, Est AFR Am: >60 mL/min (ref >60)
GFR, Estimated: >60 mL/min (ref >60)
Glucose, Bld: 332 mg/dL — ABNORMAL HIGH (ref 70–99)
Potassium: 4.5 mmol/L (ref 3.5–5.1)
Sodium: 138 mmol/L (ref 135–145)
Total Bilirubin: 0.7 mg/dL (ref 0.3–1.2)
Total Protein: 7.2 g/dL (ref 6.5–8.1)

## 2019-06-23 LAB — CBC WITH DIFFERENTIAL (CANCER CENTER ONLY)
Abs Immature Granulocytes: 0.33 10*3/uL — ABNORMAL HIGH (ref 0.00–0.07)
Basophils Absolute: 0.1 10*3/uL (ref 0.0–0.1)
Basophils Relative: 0 %
Eosinophils Absolute: 0.3 10*3/uL (ref 0.0–0.5)
Eosinophils Relative: 1 %
HCT: 32.2 % — ABNORMAL LOW (ref 36.0–46.0)
Hemoglobin: 9.9 g/dL — ABNORMAL LOW (ref 12.0–15.0)
Immature Granulocytes: 1 %
Lymphocytes Relative: 7 %
Lymphs Abs: 1.9 10*3/uL (ref 0.7–4.0)
MCH: 25.7 pg — ABNORMAL LOW (ref 26.0–34.0)
MCHC: 30.7 g/dL (ref 30.0–36.0)
MCV: 83.6 fL (ref 80.0–100.0)
Monocytes Absolute: 1.3 10*3/uL — ABNORMAL HIGH (ref 0.1–1.0)
Monocytes Relative: 5 %
Neutro Abs: 21.8 10*3/uL — ABNORMAL HIGH (ref 1.7–7.7)
Neutrophils Relative %: 86 %
Platelet Count: 407 10*3/uL — ABNORMAL HIGH (ref 150–400)
RBC: 3.85 MIL/uL — ABNORMAL LOW (ref 3.87–5.11)
RDW: 19.3 % — ABNORMAL HIGH (ref 11.5–15.5)
WBC Count: 25.6 10*3/uL — ABNORMAL HIGH (ref 4.0–10.5)
nRBC: 0.1 % (ref 0.0–0.2)

## 2019-06-23 LAB — MAGNESIUM: Magnesium: 1.6 mg/dL — ABNORMAL LOW (ref 1.7–2.4)

## 2019-06-23 MED ORDER — LEVOFLOXACIN 750 MG PO TABS: 750.0000 mg | ORAL_TABLET | Freq: Every day | ORAL | 0 refills | Status: DC

## 2019-06-23 MED ORDER — CIPROFLOXACIN HCL 500 MG PO TABS: 500.0000 mg | ORAL_TABLET | Freq: Two times a day (BID) | ORAL | 0 refills | Status: DC

## 2019-06-23 NOTE — Progress Notes (Signed)
[place]   [phone] [phone]   Clinic Follow up Note   Patient Care Team: Esaw Grandchild, NP as PCP - General (Family Medicine) Delrae Rend, MD as Consulting Physician (Endocrinology) Truitt Merle, MD as Consulting Physician (Hematology) Alla Feeling, NP as Nurse Practitioner (Nurse Practitioner) Clarene Essex, MD as Consulting Physician (Gastroenterology) Date of Service: 06/23/2019  CHIEF COMPLAINT: F/u colon cancer   SUMMARY OF ONCOLOGIC HISTORY: Oncology History Overview Note  Cancer Staging Malignant neoplasm of rectosigmoid junction [facility]) Staging form: Colon and Rectum, AJCC 8th Edition - Clinical stage from 06/11/2017: Stage IVA (cTX, cN1b, pM1a) - Signed by Truitt Merle, MD on 06/15/2017     Malignant neoplasm of rectosigmoid junction ([place])  05/30/2017 Imaging   CT AP IMPRESSION: 1. 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 Abdominopelvic adenopathy. Rectosigmoid mass with suggestion of partial obstruction as evidenced by large colonic stool burden more proximally. 2.  Aortic Atherosclerosis (ICD10-I70.0).  This is age advanced.   05/31/2017 Tumor Marker   CEA 353.3 (elevated) AFP 4.5 (normal)   05/31/2017 Initial Biopsy   Diagnosis Colon, biopsy, sigmoid - INVASIVE ADENOCARCINOMA - SEE COMMENT   05/31/2017 Imaging   CT CHEST IMPRESSION: 1. No evidence of metastatic disease in the chest. 2. No acute findings.  Aortic Atherosclerosis (ICD10-I70.0).    05/31/2017 Procedure   Colonoscopy per Dr. Watt Climes Findings: An infiltrative and ulcerated partially obstructing medium-sized mass was found in the recto-sigmoid colon. The mass was circumferential. The mass measured four cm in length. No bleeding was present.   Impression - One small polyp in the rectum. - The examination was otherwise normal. - Malignant partially obstructing tumor in the recto-sigmoid colon.  Biopsied. - One medium polyp in the proximal sigmoid colon. - The examination was otherwise normal. - Internal hemorrhoids.   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 Mild progression of hepatic metastasis. 2. Similar rectosigmoid primary with abdominopelvic nodal metastasis. 3.  No acute process or evidence of metastatic disease in the chest. 4. Aortic Atherosclerosis (ICD10-I70.0). Coronary artery atherosclerosis. This is age advanced. 5. Proximal colonic constipation, again suggesting a component of partial obstruction at the primary site. 6. Mild ascending aortic dilatation at 4.1 cm.   08/25/2017 - 05/10/2018 Chemotherapy   -Xeloda '1500mg'$  BID 1 week on and 1 week off starting 08/25/17. Due to her worsening hand-foot syndrome her dose was reduced to '1500mg'$  in the AM and '1000mg'$  in the PM. Due to disease progression we swithced her to CAPOX  -Vectibix every 2 weeks starting 08/25/17. Due to skin rash will stop starting 05/10/18.     12/14/2017 Imaging   IMPRESSION: 1. Response to therapy. Marked decrease in hepatic metastatic burden. More mild decrease in abdominopelvic adenopathy and definition of rectosigmoid primary. 2.  No acute process or evidence of metastatic disease in the chest. 3.  Aortic Atherosclerosis (ICD10-I70.0).    04/28/2018 Imaging   CT CAP 04/28/18  IMPRESSION: 1. Index liver metastases measured on the previous study show no substantial interval change although new liver lesions on today's exam are concerning for progressive disease. 2. Mild lymphadenopathy in the  upper abdomen is stable. 3. Persistent mild ill-defined wall thickening  in the distal sigmoid colon near the rectosigmoid junction. 4.  Aortic Atherosclerois (ICD10-170.0)    05/17/2018 - 12/02/2018 Chemotherapy   CAPOX every 3 weeks with Xeloda '1500mg'$  BID 2 weeks on/1week off starting 05/17/18 -Add Avastin to first cycle.    09/09/2018 Imaging   CT CAP W Contrast IMPRESSION: 1. Slight interval decrease in size one of the right hepatic lobe lesions. Additional lesions within the liver are similar when compared to recent prior exam. 2. Mild adenopathy within the abdomen is stable. 3. Similar-appearing thick walled distal sigmoid colon.   12/20/2018 Imaging   CT CAP W Contrast  IMPRESSION: 1. Increase in size and number of hepatic metastatic lesions compared to the prior study. 2. Stable adenopathy in the abdomen. 3. Signs of sigmoid wall thickening as before.   01/04/2019 - 01/28/2019 Chemotherapy   Irinotecan and Avastin q2weeks starting 01/04/19.  Stoppes due to disease progression.    03/08/2019 Imaging   CT CAP W Contrast  IMPRESSION: 1. Interval progression of liver metastasis. 2. Stable borderline enlarged upper abdominal lymph node. 3. Aortic atherosclerosis.   Aortic Atherosclerosis (ICD10-I70.0).   04/04/2019 -  Chemotherapy   FOLFIRI and Vectibix q2weeks starting 04/04/19     CURRENT THERAPY:  Irinotecanand Vectibix q2weeks starting 04/04/19.50% 5FU with C2 only. Will add Xeloda '1000mg'$  BID 1 week on/1 week off starting with C4.  INTERVAL HISTORY: Beverly Schwartz returns for f/u and possible treatment today. Her last treatment was 05/19/19. She feels better today. She has been more up and awake from having visitors. She still spends more than 1/2 the day in bed/resting. Her appetite improved. She thinks she is drinking more. She takes mag at least once and calcium sometimes when she can. She does not think she's taking iron. She has constant pain in her right flank which is new and progressive over the last several weeks. This worsens with  twisting/reaching. Massage helps. It is often associated with RUQ pain. The pain is a dull ache. NSAIDs and tylenol #3 help. She has not started norco yet. Denies dysuria, frequency, or urgency. Denies fever or chills. Bowels moving once daily. Denies bleeding. Denies fever, chills, cough, chest pain, dyspnea, leg edema, open wound, mucositis, or pain at [facility].    MEDICAL HISTORY:  Past Medical History:  Diagnosis Date  . Anxiety   . Cancer ([place])    colon, liver  . Chest pain 10/18/2018   Patient c/o chest pain right side yesterday intermitten lasting 3-4 hours.  [name], PA notified  . Colon cancer ([place])   . Complication of anesthesia   . Depression   . Diabetes mellitus   . Dizziness   . Family history of breast cancer   . Family history of stomach cancer   . Hyperlipidemia   . Hypertension   . Hypothyroidism   . Obesity   . PONV (postoperative nausea and vomiting)   . Tachycardia     SURGICAL HISTORY: Past Surgical History:  Procedure Laterality Date  . FLEXIBLE SIGMOIDOSCOPY N/A 05/31/2017   Procedure: FLEXIBLE SIGMOIDOSCOPY;  Surgeon: Clarene Essex, MD;  Location: [facility] ENDOSCOPY;  Service: Endoscopy;  Laterality: N/A;  . IR IMAGING GUIDED PORT INSERTION  10/19/2018  . WISDOM TOOTH EXTRACTION     age 26's    I have reviewed the social history and family history with the patient and they are unchanged from previous note.  ALLERGIES:  is allergic to bupropion and amoxicillin.  MEDICATIONS:  Current Outpatient Medications  Medication Sig Dispense Refill  . acetaminophen-codeine (TYLENOL #3) 300-30 MG tablet Take 1 tablet by mouth every 6 (six) hours as needed for moderate pain. 30 tablet 0  . albuterol (PROVENTIL HFA;VENTOLIN HFA) 108 (90 Base) MCG/ACT inhaler Inhale 2 puffs into the lungs every 6 (six) hours as needed for wheezing or shortness of breath. 1 Inhaler 0  . ALPRAZolam (XANAX) 1 MG tablet TAKE 1 TABLET BY MOUTH AT BEDTIME AS NEEDED FOR ANXIETY (Patient taking  differently: Take 1 mg by mouth at bedtime as needed for anxiety. ) 30 tablet 0  . amLODipine (NORVASC) 10 MG tablet TAKE 1 TABLET BY MOUTH EVERY DAY 30 tablet 0  . benzonatate (TESSALON) 100 MG capsule Take 1 capsule (100 mg total) by mouth 3 (three) times daily as needed for cough. 30 capsule 0  . carbamide peroxide (DEBROX) 6.5 % OTIC solution Place 5 drops into both ears 2 (two) times daily. 15 mL 0  . CINNAMON PO Take 1 tablet by mouth daily.     . fluticasone (FLONASE) 50 MCG/ACT nasal spray Place 1 spray into both nostrils daily. (Patient taking differently: Place 1 spray into both nostrils daily as needed for allergies. ) 16 g 2  . HYDROcodone-acetaminophen (NORCO) 5-325 MG tablet Take 1 tablet by mouth every 6 (six) hours as needed for moderate pain. 30 tablet 0  . insulin NPH-regular Human (NOVOLIN 70/30) (70-30) 100 UNIT/ML injection INJECT 10 UNITS WITH BREAKFAST, 10 UNITS WITH DINNER (Patient taking differently: Inject 10 Units into the skin 2 (two) times daily with a meal. ) 10 mL 2  . Insulin Syringe-Needle U-100 (BD INSULIN SYRINGE U/F) 31G X 5/16" 1 ML MISC Inject twice a day as directed 200 each 3  . levothyroxine (SYNTHROID) 112 MCG tablet TAKE 1 TABLET BY MOUTH DAILY BEFORE BREAKFAST. LABS REQUIRED PRIOR TO ANY FURTHER REFILLS. 15 tablet 0  . lidocaine (XYLOCAINE) 2 % solution     . lidocaine-prilocaine (EMLA) cream Apply 1 application topically as needed. 30 g 1  . lisinopril (ZESTRIL) 5 MG tablet TAKE 1 TABLET BY MOUTH EVERY DAY 90 tablet 0  . magnesium oxide (MAG-OX) 400 (241.3 Mg) MG tablet Take 1 tablet (400 mg total) by mouth daily. (Patient taking differently: Take 400 mg by mouth in the morning, at noon, and at bedtime. ) 90 tablet 1  . magnesium oxide (MAG-OX) 400 MG tablet Take 400 mg by mouth 2 (two) times daily.    . mometasone (ELOCON) 0.1 % cream Apply 1 application topically daily as needed.    . Omega-3 Fatty Acids (FISH OIL) 1000 MG CAPS Take 1,000 mg by mouth  daily.     . ondansetron (ZOFRAN ODT) 8 MG disintegrating tablet Take 1 tablet (8 mg total) by mouth every 8 (eight) hours as needed for nausea or vomiting. 20 tablet 1  . polyethylene glycol (MIRALAX / GLYCOLAX) packet Take 17 g by mouth daily. 14 each 0  . TURMERIC PO Take 1 capsule by mouth daily.     . capecitabine (XELODA) 500 MG tablet Take 2 tablets (1,000 mg total) by mouth 2 (two) times daily after a meal. Take for 7 days, then hold for 7 days. Repeat every 14 days. 56 tablet 0  . clindamycin (CLINDAGEL) 1 % gel Apply topically 2 (two) times daily. 30 g 2  . levofloxacin (LEVAQUIN) 750 MG tablet Take 1 tablet (750 mg total) by mouth daily. 7 tablet 0   No current  facility-administered medications for this visit.   Facility-Administered Medications Ordered in Other Visits  Medication Dose Route Frequency Provider Last Rate Last Admin  . 0.9 %  sodium chloride infusion   Intravenous Continuous Truitt Merle, MD   Stopped at 11/11/18 1330    PHYSICAL EXAMINATION: ECOG PERFORMANCE STATUS: 3 - Symptomatic, >50% confined to bed  Vitals:   06/23/19 1204  BP: (!) 125/93  Pulse: (!) 123  Resp: 18  Temp: 98.2 F (36.8 C)  SpO2: 100%   Filed Weights   06/23/19 1204  Weight: 150 lb (68 kg)    GENERAL:alert, no distress and comfortable SKIN: no rash  EYES: sclera clear OROPHARYNX: no thrush or ulcers NECK: without mass LUNGS: diminished right lung base, otherwise clear. Normal breathing effort  HEART: tachycardic, regular rhythm, no lower extremity edema ABDOMEN: abdomen soft with normal bowel sounds. Palpable liver throughout the right abdomen with mild tenderness  Musculoskeletal: no focal spinal tenderness. Mild right flank tenderness  NEURO: alert & oriented x 3 with fluent speech, normal gait PAC without erythema   LABORATORY DATA:  I have reviewed the data as listed CBC Latest Ref Rng & Units 06/23/2019 06/14/2019 06/06/2019  WBC 4.0 - 10.5 K/uL 25.6(H) 21.9(H) 11.1(H)    Hemoglobin 12.0 - 15.0 g/dL 9.9(L) 10.1(L) 11.1(L)  Hematocrit 36.0 - 46.0 % 32.2(L) 33.0(L) 35.7(L)  Platelets 150 - 400 K/uL 407(H) 316 459(H)     CMP Latest Ref Rng & Units 06/23/2019 06/14/2019 06/06/2019  Glucose 70 - 99 mg/dL 332(H) 248(H) 185(H)  BUN 6 - 20 mg/dL 7 11 4(L)  Creatinine 0.44 - 1.00 mg/dL 0.78 0.81 0.72  Sodium 135 - 145 mmol/L 138 138 147(H)  Potassium 3.5 - 5.1 mmol/L 4.5 3.7 3.8  Chloride 98 - 111 mmol/L 97(L) 99 105  CO2 22 - 32 mmol/L '23 26 26  '$ Calcium 8.9 - 10.3 mg/dL 7.4(L) 6.7(L) 5.8(LL)  Total Protein 6.5 - 8.1 g/dL 7.2 6.9 6.7  Total Bilirubin 0.3 - 1.2 mg/dL 0.7 1.0 0.4  Alkaline Phos 38 - 126 U/L 995(H) 652(H) 366(H)  AST 15 - 41 U/L 63(H) 53(H) 59(H)  ALT 0 - 44 U/L '24 26 26      '$ RADIOGRAPHIC STUDIES: I have personally reviewed the radiological images as listed and agreed with the findings in the report. DG Chest 2 View  Result Date: 06/23/2019 CLINICAL DATA:  Right chest and flank pain, decreased right base breath sounds EXAM: CHEST - 2 VIEW COMPARISON:  03/27/2019 FINDINGS: The heart size and mediastinal contours are within normal limits. Right chest port catheter. New moderate right pleural effusion and associated atelectasis or consolidation. Disc degenerative disease of the thoracic spine. IMPRESSION: New moderate right pleural effusion and associated atelectasis or consolidation. Electronically Signed   By: Eddie Candle M.D.   On: 06/23/2019 13:20     ASSESSMENT & PLAN: Beverly Hajjar Knightis a 49 y.o.femalewith    1. Right flank pain -she is mildly tender on exam. Taking tylenol #3 with good relief. She has not started norco yet -UA shows amber urine with bacteria and increased WBCs, culture is pending -due to decreased breath sounds in right base I obtained a chest xray which showed new moderate pleural effusion with atelectasis or consolidation. She is asymptomatic, no cough or dyspnea -given her leukocytosis, I started her on broad  spectrum levaquin for 7 days.  -She is scheduled for restaging scan next week, I am concerned for disease progression  -Continue supportive care for pain  2. Sigmoid adenocarcinoma, with abdominopelvic adenopathy andhepatic metastasis, stage IV, MSI-stable , KRAS/NRAS/BRAF wild type -Diagnosed in 05/2017. Patient declined intensive chemo regiments previously due to fear of side effects.She progressed on first lineXelodaandVectibixafter 8 months of therapy. She progressed on second line CAPOX and avastin, she did not tolerate well due to hand/foot syndrome and neuropathy. -She had PAC placed on 10/19/18. -She started third line irinotecan and avastin on 10/13, patient declined starting 5FU while she moved into new house on her mother's land. She received 2 doses chemo before restaging on 03/08/19 showed interval progression of liver mets and stable to borderline abd LNs -Hospitalized for syncope, confusion, n/v/ and pain. CT on 03/27/19 showed progression of liver metastasis and new and progressive adenopathy, CEA is markedly elevated to 1277 which is consistent with progression. -She started FOLFIRI with 5FU pump over 24 hours (1/2 dose) but did not tolerate well due to fatigue and nausea and did not want to retry -S/p 4 cycles irinotecan (dose reduced with C4) and panitumumab, She added low dose Xeloda 1000 mg BID 1 week on and 1 week off starting 05/19/19 with C4 but only took for a few days. She had nausea, decreased po intake with weight loss, and diarrhea.  -She had delayed recovery, treatment was held on 3/15 due to hypomagnesemia. She stayed in bed most of the last week due to fatigue and nausea. -Ms. Manocchio appears improved. Her energy and appetite have increased. Labs reviewed. Her electrolytes also imrpoved, continue supplements.   -She is not interested in restarting irinotecan and panitumumab and Xeloda, she tolerated poorly. She is, however, open to other treatment options if she is a  candidate.  -she has palliative care consult at home on 06/24/19 -she is being scheduled for phone f/u after CT next week for further treatment and goals of care discussion.  -I reviewed the plan with Dr. Burr Medico   3. DM, HTN, HL -HTN improved after she came off Avastin.  -Her BP was low recently and I held her anti-hypertensives  4. Financial and Social issues, Anxiety, depression -followed by SW and Chaplain -She is followed by mental health provider Art Buff -On Xanax PRN which is helpful, she is requiring less now that she lives close to her mother Beverly Schwartz regained insurancewithBlue cross insurancein 2020 -Followed by Lorrin Jackson, chaplain for emotional and spiritual support   5.Goal of care discussion -She understands the goal of care is palliative, her cancer is incurable. The goal of therapy is to prolong her life and improve her quality of life. -She is full code for now  -We recently discussed palliative care vs hospice and the goals/mission of each. Patient and her mother agree to palliative care, and they have an apt on 4/2. She is still interested in treatment if she can tolerate, not ready for hospice yet.  -she has her son next week for his Spring break which she is looking forward to  6. 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 -not discussed today  7. Skin acne rash, secondary to Vectibix  -mild on her face and neck, moderate on chest -She hasOTC hydrocortisone cream and Clindamycin gelwhich I refilled today -mild  8. Iron deficiency  -Ferritin 106 on 05/02/19, she is not on oral iron. I encouraged her to take 1 tab MWF if she can tolerate.    PLAN: -  Labs, chest xray reviewed  -Hold treatment  -Levaquin 500 mg po daily x1 week  -Push po liquids -CT CAP next week  4/8 -Phone f/u with Dr. Burr Medico 4/9  -She knows to call if she develops fever, chills, cough, dyspnea, worsening pain or other concerns  No problem-specific Assessment & Plan notes found for this encounter.   Orders Placed This Encounter  Procedures  . Urine Culture    Standing Status:   Future    Number of Occurrences:   1    Standing Expiration Date:   06/22/2020  . DG Chest 2 View    Standing Status:   Future    Number of Occurrences:   1    Standing Expiration Date:   06/22/2020    Order Specific Question:   Reason for Exam (SYMPTOM  OR DIAGNOSIS REQUIRED)    Answer:   right flank pain, decreaesd R base breath sounds    Order Specific Question:   Is patient pregnant?    Answer:   No    Order Specific Question:   Preferred imaging location?    Answer:   [facility]    Order Specific Question:   Radiology Contrast Protocol - do NOT remove file path    Answer:   [email or web address]  . Urinalysis, Complete w Microscopic    Standing Status:   Future    Number of Occurrences:   1    Standing Expiration Date:   06/22/2020   All questions were answered. The patient knows to call the clinic with any problems, questions or concerns. No barriers to learning was detected. Total time spent in today's encounter was 40 minutes.      Alla Feeling, NP 06/24/19

## 2019-06-24 ENCOUNTER — Encounter: Payer: Self-pay | Admitting: Nurse Practitioner

## 2019-06-24 ENCOUNTER — Telehealth: Payer: Self-pay | Admitting: Hematology

## 2019-06-24 NOTE — Telephone Encounter (Signed)
Scheduled appts per 4/1 los. Called pt to relay appt information. Pt's voicemail box was full. Will try to call pt again.

## 2019-06-26 ENCOUNTER — Other Ambulatory Visit: Payer: Self-pay | Admitting: Oncology

## 2019-06-26 DIAGNOSIS — C19 Malignant neoplasm of rectosigmoid junction: Secondary | ICD-10-CM

## 2019-06-26 LAB — URINE CULTURE: Culture: >=100000 — AB

## 2019-06-26 MED ORDER — CEPHALEXIN 500 MG PO CAPS: 500.0000 mg | ORAL_CAPSULE | Freq: Four times a day (QID) | ORAL | 0 refills | Status: DC

## 2019-06-26 MED ORDER — NITROFURANTOIN MONOHYD MACRO 100 MG PO CAPS: 100.0000 mg | ORAL_CAPSULE | Freq: Two times a day (BID) | ORAL | 0 refills | Status: AC

## 2019-06-26 MED ORDER — CEPHALEXIN 500 MG PO CAPS: 500.0000 mg | ORAL_CAPSULE | Freq: Two times a day (BID) | ORAL | 0 refills | Status: DC

## 2019-06-26 MED ORDER — ACETAMINOPHEN-CODEINE #3 300-30 MG PO TABS: 1.0000 | ORAL_TABLET | Freq: Four times a day (QID) | ORAL | 0 refills | Status: AC | PRN

## 2019-06-26 NOTE — Progress Notes (Unsigned)
Pt called c/o nausea with cipro; has zofran but does not want to take it; sensitivities, allergies checked, nitrofurantoin called in

## 2019-06-27 ENCOUNTER — Encounter: Payer: Self-pay | Admitting: Adult Health

## 2019-06-27 ENCOUNTER — Encounter: Payer: Self-pay | Admitting: Nurse Practitioner

## 2019-06-27 NOTE — Progress Notes (Signed)
[place]   [phone] [phone]) [phone]   Clinic Follow up Note   Patient Care Team: Esaw Grandchild, NP as PCP - General (Family Medicine) Delrae Rend, MD as Consulting Physician (Endocrinology) Truitt Merle, MD as Consulting Physician (Hematology) Alla Feeling, NP as Nurse Practitioner (Nurse Practitioner) Clarene Essex, MD as Consulting Physician (Gastroenterology)   I connected with Cherly Beach on 07/01/2019 at  8:00 AM EDT by video enabled telemedicine visit and verified that I am speaking with the correct person using two identifiers.  I discussed the limitations, risks, security and privacy concerns of performing an evaluation and management service by telephone and the availability of in person appointments. I also discussed with the patient that there may be a patient responsible charge related to this service. The patient expressed understanding and agreed to proceed.   Other persons participating in the visit and their role in the encounter:  Her Mother  Patient's location:  Her home Provider's location:   My Office  CHIEF COMPLAINT: F/u metastatic colon cancer  SUMMARY OF ONCOLOGIC HISTORY: Oncology History Overview Note  Cancer Staging Malignant neoplasm of rectosigmoid junction [facility]) Staging form: Colon and Rectum, AJCC 8th Edition - Clinical stage from 06/11/2017: Stage IVA (cTX, cN1b, pM1a) - Signed by Truitt Merle, MD on 06/15/2017     Malignant neoplasm of rectosigmoid junction ([place])  05/30/2017 Imaging   CT AP IMPRESSION: 1. 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 Abdominopelvic adenopathy. Rectosigmoid mass with suggestion of partial obstruction as evidenced by large colonic stool burden more proximally. 2.  Aortic Atherosclerosis (ICD10-I70.0).  This is age advanced.   05/31/2017 Tumor Marker   CEA 353.3 (elevated) AFP 4.5 (normal)   05/31/2017 Initial Biopsy     Diagnosis Colon, biopsy, sigmoid - INVASIVE ADENOCARCINOMA - SEE COMMENT   05/31/2017 Imaging   CT CHEST IMPRESSION: 1. No evidence of metastatic disease in the chest. 2. No acute findings.  Aortic Atherosclerosis (ICD10-I70.0).    05/31/2017 Procedure   Colonoscopy per Dr. Watt Climes Findings: An infiltrative and ulcerated partially obstructing medium-sized mass was found in the recto-sigmoid colon. The mass was circumferential. The mass measured four cm in length. No bleeding was present.   Impression - One small polyp in the rectum. - The examination was otherwise normal. - Malignant partially obstructing tumor in the recto-sigmoid colon. Biopsied. - One medium polyp in the proximal sigmoid colon. - The examination was otherwise normal. - Internal hemorrhoids.   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 Mild progression of hepatic metastasis. 2. Similar rectosigmoid primary with abdominopelvic nodal metastasis. 3.  No acute process or evidence of metastatic disease in the chest. 4. Aortic Atherosclerosis (ICD10-I70.0). Coronary artery atherosclerosis. This is age advanced. 5. Proximal colonic constipation, again suggesting a component of partial obstruction at the primary site. 6. Mild ascending aortic dilatation at 4.1 cm.   08/25/2017 - 05/10/2018 Chemotherapy   -Xeloda '1500mg'$  BID 1 week on and 1 week off starting 08/25/17. Due to her worsening hand-foot syndrome her dose was reduced to '1500mg'$  in the AM and '1000mg'$  in the PM. Due to disease progression  we swithced her to CAPOX  -Vectibix every 2 weeks starting 08/25/17. Due to skin rash will stop  starting 05/10/18.     12/14/2017 Imaging   IMPRESSION: 1. Response to therapy. Marked decrease in hepatic metastatic burden. More mild decrease in abdominopelvic adenopathy and definition of rectosigmoid primary. 2.  No acute process or evidence of metastatic disease in the chest. 3.  Aortic Atherosclerosis (ICD10-I70.0).    04/28/2018 Imaging   CT CAP 04/28/18  IMPRESSION: 1. Index liver metastases measured on the previous study show no substantial interval change although new liver lesions on today's exam are concerning for progressive disease. 2. Mild lymphadenopathy in the upper abdomen is stable. 3. Persistent mild ill-defined wall thickening in the distal sigmoid colon near the rectosigmoid junction. 4.  Aortic Atherosclerois (ICD10-170.0)    05/17/2018 - 12/02/2018 Chemotherapy   CAPOX every 3 weeks with Xeloda '1500mg'$  BID 2 weeks on/1week off starting 05/17/18 -Add Avastin to first cycle.    09/09/2018 Imaging   CT CAP W Contrast IMPRESSION: 1. Slight interval decrease in size one of the right hepatic lobe lesions. Additional lesions within the liver are similar when compared to recent prior exam. 2. Mild adenopathy within the abdomen is stable. 3. Similar-appearing thick walled distal sigmoid colon.   12/20/2018 Imaging   CT CAP W Contrast  IMPRESSION: 1. Increase in size and number of hepatic metastatic lesions compared to the prior study. 2. Stable adenopathy in the abdomen. 3. Signs of sigmoid wall thickening as before.   01/04/2019 - 01/28/2019 Chemotherapy   Irinotecan and Avastin q2weeks starting 01/04/19.  Stoppes due to disease progression.    03/08/2019 Imaging   CT CAP W Contrast  IMPRESSION: 1. Interval progression of liver metastasis. 2. Stable borderline enlarged upper abdominal lymph node. 3. Aortic atherosclerosis.   Aortic Atherosclerosis (ICD10-I70.0).   04/04/2019 - 04/2019 Chemotherapy   FOLFIRI and Vectibix q2weeks starting 04/04/19. Irinotecan  and Vectibix q2weeks starting 04/04/19. 50% 5FU with C2 only. Added Xeloda '1000mg'$  BID 1 week on/1 week off starting with C4. Last treatment was cycle 4 in 04/2019.    06/30/2019 Imaging   CT CAP W contrast  IMPRESSION: 1. Moderate to marked progression of hepatic and abdominal nodal metastasis since 03/27/19. Developing minimal left intrahepatic duct dilatation, presumably by mass effect of central hepatic metastasis. 2. Since the most recent chest CT of 03/08/2019, development of tiny bilateral pulmonary nodules which are technically indeterminate but favored to represent early metastasis. 3. New small right and trace left pleural effusions. 4. Developing retrocrural adenopathy indicative of nodal metastasis. 5. Edema along the gonadal veins and within the adnexa, greater left than right. Given new concurrent abdominopelvic small volume ascites, this is indeterminate. Possibly related to increased portal venous pressure or mass-effect upon the gonadal veins, given progressive abdominal retroperitoneal adenopathy. 6. Possible constipation. Persistent architectural distortion and underdistention at the rectosigmoid junction, possibly site of prior primary. No well-defined mass or high-grade obstruction at this site.      CURRENT THERAPY:  -Irinotecan and Vectibix q2weeks starting 04/04/19. 50% 5FU with C2 only. Added Xeloda '1000mg'$  BID 1 week on/1 week off starting with C4. Last treatment was cycle 4 in 04/2019.  -PENDING Lonsurf and Avastin  INTERVAL HISTORY:  KELSE PLOCH is here for a follow up. They identified themselves by face to face video. She notes she is feeling better but still has right lumber back pain (over kidney) that can get to 9-10/10. She has been taking hydrocodone. She also has  tylenol #3. She notes she has 2 days left of her Levaquin. She notes her nose has been running which she attributes to allergies. She notes shallow cough occasionally. She notes feeling weak when  she gets out of bed. She notes she can do ALD but not much of house chores including cooking. She is often sitting or laying down at least 50% of the day.    REVIEW OF SYSTEMS:   Constitutional: Denies fevers, chills or abnormal weight loss Eyes: Denies blurriness of vision Ears, nose, mouth, throat, and face: Denies mucositis or sore throat Respiratory: Denies cough, dyspnea or wheezes Cardiovascular: Denies palpitation, chest discomfort or lower extremity swelling Gastrointestinal:  Denies nausea, heartburn or change in bowel habits Skin: Denies abnormal skin rashes MSK: (+) right lumbar back pain  Lymphatics: Denies new lymphadenopathy or easy bruising Neurological:Denies numbness, tingling or new weaknesses Behavioral/Psych: Mood is stable, no new changes  All other systems were reviewed with the patient and are negative.  MEDICAL HISTORY:  Past Medical History:  Diagnosis Date  . Anxiety   . Cancer ([place])    colon, liver  . Chest pain 10/18/2018   Patient c/o chest pain right side yesterday intermitten lasting 3-4 hours.  [name], PA notified  . Colon cancer ([place])   . Complication of anesthesia   . Depression   . Diabetes mellitus   . Dizziness   . Family history of breast cancer   . Family history of stomach cancer   . Hyperlipidemia   . Hypertension   . Hypothyroidism   . Obesity   . PONV (postoperative nausea and vomiting)   . Tachycardia     SURGICAL HISTORY: Past Surgical History:  Procedure Laterality Date  . FLEXIBLE SIGMOIDOSCOPY N/A 05/31/2017   Procedure: FLEXIBLE SIGMOIDOSCOPY;  Surgeon: Clarene Essex, MD;  Location: [facility];  Service: Endoscopy;  Laterality: N/A;  . IR IMAGING GUIDED PORT INSERTION  10/19/2018  . WISDOM TOOTH EXTRACTION     age 3's    I have reviewed the social history and family history with the patient and they are unchanged from previous note.  ALLERGIES:  is allergic to bupropion and amoxicillin.  MEDICATIONS:  Current  Outpatient Medications  Medication Sig Dispense Refill  . acetaminophen-codeine (TYLENOL #3) 300-30 MG tablet Take 1 tablet by mouth every 6 (six) hours as needed for moderate pain. 30 tablet 0  . albuterol (PROVENTIL HFA;VENTOLIN HFA) 108 (90 Base) MCG/ACT inhaler Inhale 2 puffs into the lungs every 6 (six) hours as needed for wheezing or shortness of breath. 1 Inhaler 0  . ALPRAZolam (XANAX) 1 MG tablet TAKE 1 TABLET BY MOUTH AT BEDTIME AS NEEDED FOR ANXIETY (Patient taking differently: Take 1 mg by mouth at bedtime as needed for anxiety. ) 30 tablet 0  . amLODipine (NORVASC) 10 MG tablet TAKE 1 TABLET BY MOUTH EVERY DAY 30 tablet 0  . benzonatate (TESSALON) 100 MG capsule Take 1 capsule (100 mg total) by mouth 3 (three) times daily as needed for cough. 30 capsule 0  . capecitabine (XELODA) 500 MG tablet Take 2 tablets (1,000 mg total) by mouth 2 (two) times daily after a meal. Take for 7 days, then hold for 7 days. Repeat every 14 days. 56 tablet 0  . carbamide peroxide (DEBROX) 6.5 % OTIC solution Place 5 drops into both ears 2 (two) times daily. 15 mL 0  . CINNAMON PO Take 1 tablet by mouth daily.     . clindamycin (CLINDAGEL) 1 % gel  Apply topically 2 (two) times daily. 30 g 2  . fluticasone (FLONASE) 50 MCG/ACT nasal spray Place 1 spray into both nostrils daily. (Patient taking differently: Place 1 spray into both nostrils daily as needed for allergies. ) 16 g 2  . HYDROcodone-acetaminophen (NORCO) 5-325 MG tablet Take 1 tablet by mouth every 6 (six) hours as needed for moderate pain. 30 tablet 0  . insulin NPH-regular Human (NOVOLIN 70/30) (70-30) 100 UNIT/ML injection INJECT 10 UNITS WITH BREAKFAST, 10 UNITS WITH DINNER (Patient taking differently: Inject 10 Units into the skin 2 (two) times daily with a meal. ) 10 mL 2  . Insulin Syringe-Needle U-100 (BD INSULIN SYRINGE U/F) 31G X 5/16" 1 ML MISC Inject twice a day as directed 200 each 3  . levothyroxine (SYNTHROID) 112 MCG tablet TAKE 1  TABLET BY MOUTH DAILY BEFORE BREAKFAST. LABS REQUIRED PRIOR TO ANY FURTHER REFILLS. 30 tablet 0  . lidocaine (XYLOCAINE) 2 % solution     . lidocaine-prilocaine (EMLA) cream Apply 1 application topically as needed. 30 g 1  . lisinopril (ZESTRIL) 5 MG tablet TAKE 1 TABLET BY MOUTH EVERY DAY 30 tablet 0  . magnesium oxide (MAG-OX) 400 (241.3 Mg) MG tablet Take 1 tablet (400 mg total) by mouth daily. (Patient taking differently: Take 400 mg by mouth in the morning, at noon, and at bedtime. ) 90 tablet 1  . magnesium oxide (MAG-OX) 400 MG tablet Take 400 mg by mouth 2 (two) times daily.    . mometasone (ELOCON) 0.1 % cream Apply 1 application topically daily as needed.    . nitrofurantoin, macrocrystal-monohydrate, (MACROBID) 100 MG capsule Take 1 capsule (100 mg total) by mouth 2 (two) times daily. 14 capsule 0  . Omega-3 Fatty Acids (FISH OIL) 1000 MG CAPS Take 1,000 mg by mouth daily.     . ondansetron (ZOFRAN ODT) 8 MG disintegrating tablet Take 1 tablet (8 mg total) by mouth every 8 (eight) hours as needed for nausea or vomiting. 20 tablet 1  . oxyCODONE (OXY IR/ROXICODONE) 5 MG immediate release tablet Take 0.5-1 tablets (2.5-5 mg total) by mouth every 6 (six) hours as needed for severe pain. 20 tablet 0  . polyethylene glycol (MIRALAX / GLYCOLAX) packet Take 17 g by mouth daily. 14 each 0  . trifluridine-tipiracil (LONSURF) 20-8.19 MG tablet Take 3 tablets (60 mg of trifluridine total) by mouth 2 (two) times daily after a meal. 1 hr after AM & PM meals on days 1-5, 8-12. Repeat every 28day 60 tablet 0  . TURMERIC PO Take 1 capsule by mouth daily.      No current facility-administered medications for this visit.   Facility-Administered Medications Ordered in Other Visits  Medication Dose Route Frequency Provider Last Rate Last Admin  . 0.9 %  sodium chloride infusion   Intravenous Continuous Truitt Merle, MD   Stopped at 11/11/18 1330    PHYSICAL EXAMINATION: ECOG PERFORMANCE STATUS: 2 -  Symptomatic, <50% confined to bed  No vitals taken today, Exam not performed today   LABORATORY DATA:  I have reviewed the data as listed CBC Latest Ref Rng & Units 06/23/2019 06/14/2019 06/06/2019  WBC 4.0 - 10.5 K/uL 25.6(H) 21.9(H) 11.1(H)  Hemoglobin 12.0 - 15.0 g/dL 9.9(L) 10.1(L) 11.1(L)  Hematocrit 36.0 - 46.0 % 32.2(L) 33.0(L) 35.7(L)  Platelets 150 - 400 K/uL 407(H) 316 459(H)     CMP Latest Ref Rng & Units 06/23/2019 06/14/2019 06/06/2019  Glucose 70 - 99 mg/dL 332(H) 248(H) 185(H)  BUN 6 -  20 mg/dL 7 11 4(L)  Creatinine 0.44 - 1.00 mg/dL 0.78 0.81 0.72  Sodium 135 - 145 mmol/L 138 138 147(H)  Potassium 3.5 - 5.1 mmol/L 4.5 3.7 3.8  Chloride 98 - 111 mmol/L 97(L) 99 105  CO2 22 - 32 mmol/L '23 26 26  '$ Calcium 8.9 - 10.3 mg/dL 7.4(L) 6.7(L) 5.8(LL)  Total Protein 6.5 - 8.1 g/dL 7.2 6.9 6.7  Total Bilirubin 0.3 - 1.2 mg/dL 0.7 1.0 0.4  Alkaline Phos 38 - 126 U/L 995(H) 652(H) 366(H)  AST 15 - 41 U/L 63(H) 53(H) 59(H)  ALT 0 - 44 U/L '24 26 26      '$ RADIOGRAPHIC STUDIES: I have personally reviewed the radiological images as listed and agreed with the findings in the report. CT Chest W Contrast  Result Date: 06/30/2019 CLINICAL DATA:  Malignant neoplasm of rectosigmoid junction. Diagnosed 06/11/2017. EXAM: CT CHEST, ABDOMEN, AND PELVIS WITH CONTRAST TECHNIQUE: Multidetector CT imaging of the chest, abdomen and pelvis was performed following the standard protocol during bolus administration of intravenous contrast. CONTRAST:  166m OMNIPAQUE IOHEXOL 300 MG/ML  SOLN COMPARISON:  03/27/2019 abdominopelvic CT. Most recent chest CT 03/08/2019 FINDINGS: CT CHEST FINDINGS Cardiovascular: Right Port-A-Cath tip at low SVC. Aortic atherosclerosis. Normal heart size, without pericardial effusion. No central pulmonary embolism, on this non-dedicated study. Mediastinum/Nodes: No supraclavicular adenopathy. No mediastinal or hilar adenopathy. Soft tissue fullness, likely indicative of developing  retrocrural adenopathy. Example left retrocrural node at 1.1 cm on 56/2, new compared to 04/23/2018. Lungs/Pleura: Small right pleural effusion, new since 03/27/2019. New trace left pleural fluid. Right base compressive atelectasis. Multiple bilateral pulmonary nodules, primarily on the order of 3 mm and less. The majority of these are not apparent on the prior exam. Although some of this could be due to thinner slice collimation today, there are primarily felt to be new. Example left lower lobe nodule at 5 mm on 104/4. Other nodules including ananterior left lower lobe 3 mm nodule on 61/4 and a more medial left lower lobe nodule of 3-4 mm on 48/4. Musculoskeletal: No acute osseous abnormality. CT ABDOMEN PELVIS FINDINGS Hepatobiliary: Hepatomegaly at 24.9 cm. Since 04/24/2019, significant progression of hepatic metastasis. The dominant right liver lobe mass measures 10.3 x 10.6 cm on 42/2 versus 6.9 x 5.8 cm on the prior. The more inferior subcapsular right hepatic lobe lesion measures 5.1 cm on 63/2 versus 4.3 cm on the prior. An index segment 2 mass measures 3.3 x 2.6 cm on 47/2 and is at the site of a subtle 1.7 x 1.8 cm lesion on the prior exam (when remeasured). Gallbladder is poorly evaluated secondary to surrounding tumor burden. The left intrahepatic ducts are minimally dilated, including on 50/2, new. No common duct dilatation. Pancreas: Normal, without mass or ductal dilatation. Spleen: Normal in size, without focal abnormality. Adrenals/Urinary Tract: Normal adrenal glands. Normal kidneys, without hydronephrosis. Decompressed urinary bladder. Stomach/Bowel: Normal stomach, without wall thickening. Subtle wall thickening and underdistention at the rectosigmoid junction, relatively similar on 105/2. No high-grade obstruction. Large colonic stool burden more proximally. Normal small bowel. Vascular/Lymphatic: Aortic atherosclerosis. Edema tracks along the left greater than right gonadal veins, new including  on 88/2. Progressive abdominal adenopathy. New left periaortic adenopathy at 1.6 x 3.1 cm on 70/2. Gastrohepatic ligament nodes of up to 1.9 cm on 56/2 versus 1.3 cm on the prior. No pelvic sidewall adenopathy. Reproductive: Normal uterus. Edema within both adnexa, greater on the left. Other: New small volume abdominopelvic fluid. No evidence of omental or peritoneal  disease. Musculoskeletal: No acute osseous abnormality. Transitional S1 vertebral body. IMPRESSION: 1. Moderate to marked progression of hepatic and abdominal nodal metastasis since 03/27/19. Developing minimal left intrahepatic duct dilatation, presumably by mass effect of central hepatic metastasis. 2. Since the most recent chest CT of 03/08/2019, development of tiny bilateral pulmonary nodules which are technically indeterminate but favored to represent early metastasis. 3. New small right and trace left pleural effusions. 4. Developing retrocrural adenopathy indicative of nodal metastasis. 5. Edema along the gonadal veins and within the adnexa, greater left than right. Given new concurrent abdominopelvic small volume ascites, this is indeterminate. Possibly related to increased portal venous pressure or mass-effect upon the gonadal veins, given progressive abdominal retroperitoneal adenopathy. 6. Possible constipation. Persistent architectural distortion and underdistention at the rectosigmoid junction, possibly site of prior primary. No well-defined mass or high-grade obstruction at this site. Electronically Signed   By: Abigail Miyamoto M.D.   On: 06/30/2019 15:43   CT Abdomen Pelvis W Contrast  Result Date: 06/30/2019 CLINICAL DATA:  Malignant neoplasm of rectosigmoid junction. Diagnosed 06/11/2017. EXAM: CT CHEST, ABDOMEN, AND PELVIS WITH CONTRAST TECHNIQUE: Multidetector CT imaging of the chest, abdomen and pelvis was performed following the standard protocol during bolus administration of intravenous contrast. CONTRAST:  130m OMNIPAQUE IOHEXOL  300 MG/ML  SOLN COMPARISON:  03/27/2019 abdominopelvic CT. Most recent chest CT 03/08/2019 FINDINGS: CT CHEST FINDINGS Cardiovascular: Right Port-A-Cath tip at low SVC. Aortic atherosclerosis. Normal heart size, without pericardial effusion. No central pulmonary embolism, on this non-dedicated study. Mediastinum/Nodes: No supraclavicular adenopathy. No mediastinal or hilar adenopathy. Soft tissue fullness, likely indicative of developing retrocrural adenopathy. Example left retrocrural node at 1.1 cm on 56/2, new compared to 04/23/2018. Lungs/Pleura: Small right pleural effusion, new since 03/27/2019. New trace left pleural fluid. Right base compressive atelectasis. Multiple bilateral pulmonary nodules, primarily on the order of 3 mm and less. The majority of these are not apparent on the prior exam. Although some of this could be due to thinner slice collimation today, there are primarily felt to be new. Example left lower lobe nodule at 5 mm on 104/4. Other nodules including ananterior left lower lobe 3 mm nodule on 61/4 and a more medial left lower lobe nodule of 3-4 mm on 48/4. Musculoskeletal: No acute osseous abnormality. CT ABDOMEN PELVIS FINDINGS Hepatobiliary: Hepatomegaly at 24.9 cm. Since 04/24/2019, significant progression of hepatic metastasis. The dominant right liver lobe mass measures 10.3 x 10.6 cm on 42/2 versus 6.9 x 5.8 cm on the prior. The more inferior subcapsular right hepatic lobe lesion measures 5.1 cm on 63/2 versus 4.3 cm on the prior. An index segment 2 mass measures 3.3 x 2.6 cm on 47/2 and is at the site of a subtle 1.7 x 1.8 cm lesion on the prior exam (when remeasured). Gallbladder is poorly evaluated secondary to surrounding tumor burden. The left intrahepatic ducts are minimally dilated, including on 50/2, new. No common duct dilatation. Pancreas: Normal, without mass or ductal dilatation. Spleen: Normal in size, without focal abnormality. Adrenals/Urinary Tract: Normal adrenal  glands. Normal kidneys, without hydronephrosis. Decompressed urinary bladder. Stomach/Bowel: Normal stomach, without wall thickening. Subtle wall thickening and underdistention at the rectosigmoid junction, relatively similar on 105/2. No high-grade obstruction. Large colonic stool burden more proximally. Normal small bowel. Vascular/Lymphatic: Aortic atherosclerosis. Edema tracks along the left greater than right gonadal veins, new including on 88/2. Progressive abdominal adenopathy. New left periaortic adenopathy at 1.6 x 3.1 cm on 70/2. Gastrohepatic ligament nodes of up to 1.9 cm on  56/2 versus 1.3 cm on the prior. No pelvic sidewall adenopathy. Reproductive: Normal uterus. Edema within both adnexa, greater on the left. Other: New small volume abdominopelvic fluid. No evidence of omental or peritoneal disease. Musculoskeletal: No acute osseous abnormality. Transitional S1 vertebral body. IMPRESSION: 1. Moderate to marked progression of hepatic and abdominal nodal metastasis since 03/27/19. Developing minimal left intrahepatic duct dilatation, presumably by mass effect of central hepatic metastasis. 2. Since the most recent chest CT of 03/08/2019, development of tiny bilateral pulmonary nodules which are technically indeterminate but favored to represent early metastasis. 3. New small right and trace left pleural effusions. 4. Developing retrocrural adenopathy indicative of nodal metastasis. 5. Edema along the gonadal veins and within the adnexa, greater left than right. Given new concurrent abdominopelvic small volume ascites, this is indeterminate. Possibly related to increased portal venous pressure or mass-effect upon the gonadal veins, given progressive abdominal retroperitoneal adenopathy. 6. Possible constipation. Persistent architectural distortion and underdistention at the rectosigmoid junction, possibly site of prior primary. No well-defined mass or high-grade obstruction at this site. Electronically  Signed   By: Abigail Miyamoto M.D.   On: 06/30/2019 15:43     ASSESSMENT & PLAN:  Beverly Schwartz is a 49 y.o. female with    1. Sigmoid adenocarcinoma, with abdominopelvic adenopathy andhepatic metastasis, stage IV, MSI-stable , KRAS/NRAS/BRAF wild type -Diagnosed in 05/2017. Patient declined intensive chemo regiments previously due to fear of side effects.She progressed on first lineXelodaandVectibixafter 8 months of therapy. -She had PAC placed on 10/19/18. -She recently progressed onsecond lineCAPOX and Avastin. She tolerated poorly due to hand foot syndrome and Neuropathy.  -I started her on third-line Irinotecan and Avastin 01/04/19. Dose was reduced due toneuropathy.  -Due to interval disease progression as seen on 03/08/19 CT CAP, I changed her chemo to FOLFIRI and Vectibix q2weeks starting 04/04/19.5-fu pump infusion was held for first cycleper pt's request and added over 24 Hr for C2 (50% normal dose).  -She did not tolerate the 1 day 5FU pump infusion well and does not want to retry. I previously discussed in rare situations I would combine Irinotecan with low dose Xeloda. Given she has tolerated Xeloda well before I added Xeloda '1000mg'$  BID 1 week on/1 week off starting with C4, but only took for a few days. She had nausea, decreased po intake with weight loss, and diarrhea.  -Her last chemo treatment was 05/19/19, multiple delay and on-hold due to not feeling well.  -I personally reviewed and discussed her CT CAP from 06/30/19 with her which showed moderate to marked progression of liver and abdominal nodal metastasis which is leading to minimal left intrahepatic duct dilation. She also has development of tiny b/l lung nodules which are indeterminate.  -I discussed third line treatment options, including oral treatments (Lonsurf or Stivarga). I reviewed the clinical data and overall low response rate. I discussed adding IV avastin to make Lonsurf more effective based on some  clinical trial data,  or adding Nivolumab to oral biological agent stivarga based on the REGONIVI trial result. I will see if she is eligible for free drug replacement of Nivo but it will likely take a while. I recommend her to start Lonsurf and Avastin as next line treatme Potential side effects from Lonsurf and Avastin were discussed with her in detail, she agrees to proceed. -I discussed without treatment for disease control she will develop more cancer related symptoms  -we also discussed supportive care alone and hospice, she is not ready for hospice  yet  -She is interested in treatment, will proceed with Lonsurf and Avastin. F/u in 1-2 week to start.  -She is interested in [place] vaccine. I encouraged her to proceed with it.    2. DM, HTN, HL -Continue medications (Amlodipine, lisinopril) and f/u with PCP. Will closely monitor BP on Avastin.  3. Financial and Social issues, Anxiety, depression -Shefollows up as needed withSocial Worker Hollice Espy -She is currently on Xanax  -Shehas regained insurancewithBlue cross insurancein 2020 -She is more anxious lately due to the [place] pandemic. I will asked SW to f/u with her   4.Goal of care discussion -She understands the goal of care is palliative, her cancer is incurable.The goal of therapy is to prolong her life and improve her quality of life. -I recommended DNR/DNI. She notes she wants resuscitation, but not life support. I discussed if that is what she wants to needs to specify when she wants to stop life support because intubation is apart of resuscitation.    -She notes she has a living will set up.   5.Insomnia, anxiety   -She has had trouble sleeping from anxiety and allergies  -She has Xanax which she uses as needed. -She notes being more anxious lately from [place] and concerns of contracting this.  -She can continue to f/u with our SW as needed. 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   7. Skin acne rash, secondary to Vectibix  -Moderately on chest and face -She has OTC hydrocortisone cream and Clindamycin gel, continue to use. Rash has been mild.   8. Iron deficiency  -Ferritin 226 on 05/02/19, she is not on oral iron. NP Lacie encouraged her to take 1 tab MWF if she can tolerate.   9. Right flank pain -UA shows amber urine with bacteria and increased WBCs, culture is pending. Given her leukocytosis, NP Lacie previously started her on broad spectrum levaquin for 7 days. She has 2 days left.  -Her CT CAP from 06/30/19 shows disease progression in her liver. I discussed her pain is likely from this.  -For the pain, tylenol #3 is no longer helping. She has tried hydrocodone as well. I will call in Oxycodone for her to start at half tablet (07/01/19). I advised her to watch for constipation with increased pain medication.    Plan -CT scan reviewed, shows significant disease progression  -I called in Lonsurf and Oxycodone today  -Lab, flush, f/u and Avastin on 4/19   No problem-specific Assessment & Plan notes found for this encounter.   No orders of the defined types were placed in this encounter.  I discussed the assessment and treatment plan with the patient. The patient was provided an opportunity to ask questions and all were answered. The patient agreed with the plan and demonstrated an understanding of the instructions.  The patient was advised to call back or seek an in-person evaluation if the symptoms worsen or if the condition fails to improve as anticipated.  The total time spent in the appointment was 45 minutes.    Truitt Merle, MD 07/01/2019   I, Joslyn Devon, am acting as scribe for Truitt Merle, MD.   I have reviewed the above documentation for accuracy and completeness, and I agree  with the above.

## 2019-06-28 ENCOUNTER — Other Ambulatory Visit: Payer: Medicaid Other | Admitting: Internal Medicine

## 2019-06-28 ENCOUNTER — Other Ambulatory Visit: Payer: Self-pay

## 2019-06-28 DIAGNOSIS — C19 Malignant neoplasm of rectosigmoid junction: Secondary | ICD-10-CM

## 2019-06-28 DIAGNOSIS — Z515 Encounter for palliative care: Secondary | ICD-10-CM

## 2019-06-29 ENCOUNTER — Other Ambulatory Visit: Payer: Self-pay | Admitting: Family Medicine

## 2019-06-30 ENCOUNTER — Other Ambulatory Visit: Payer: Self-pay | Admitting: Family Medicine

## 2019-06-30 ENCOUNTER — Other Ambulatory Visit: Payer: BC Managed Care – PPO

## 2019-06-30 ENCOUNTER — Ambulatory Visit: Payer: BC Managed Care – PPO | Admitting: Nurse Practitioner

## 2019-06-30 ENCOUNTER — Ambulatory Visit (HOSPITAL_COMMUNITY)
Admission: RE | Admit: 2019-06-30 | Discharge: 2019-06-30 | Disposition: A | Discharge status: Home / Self Care | Payer: BC Managed Care – PPO | Source: Ambulatory Visit | Attending: Nurse Practitioner | Admitting: Nurse Practitioner

## 2019-06-30 ENCOUNTER — Encounter: Payer: Self-pay | Admitting: Hematology

## 2019-06-30 ENCOUNTER — Ambulatory Visit: Payer: BC Managed Care – PPO

## 2019-06-30 ENCOUNTER — Other Ambulatory Visit: Payer: Self-pay

## 2019-06-30 DIAGNOSIS — C19 Malignant neoplasm of rectosigmoid junction: Secondary | ICD-10-CM | POA: Diagnosis not present

## 2019-06-30 MED ORDER — SODIUM CHLORIDE (PF) 0.9 % IJ SOLN
INTRAMUSCULAR | Status: AC
  Filled 2019-06-30: qty 50

## 2019-06-30 MED ORDER — IOHEXOL 300 MG/ML  SOLN
100.0000 mL | Freq: Once | INTRAMUSCULAR | Status: AC | PRN
  Administered 2019-06-30: 11:00:00 100 mL via INTRAVENOUS

## 2019-07-01 ENCOUNTER — Telehealth: Payer: Self-pay

## 2019-07-01 ENCOUNTER — Inpatient Hospital Stay (HOSPITAL_BASED_OUTPATIENT_CLINIC_OR_DEPARTMENT_OTHER): Payer: BC Managed Care – PPO | Admitting: Hematology

## 2019-07-01 ENCOUNTER — Telehealth: Payer: Self-pay | Admitting: Pharmacist

## 2019-07-01 ENCOUNTER — Encounter: Payer: Self-pay | Admitting: General Practice

## 2019-07-01 ENCOUNTER — Encounter: Payer: Self-pay | Admitting: Hematology

## 2019-07-01 DIAGNOSIS — I10 Essential (primary) hypertension: Secondary | ICD-10-CM

## 2019-07-01 DIAGNOSIS — C19 Malignant neoplasm of rectosigmoid junction: Secondary | ICD-10-CM

## 2019-07-01 MED ORDER — TRIFLURIDINE-TIPIRACIL 20-8.19 MG PO TABS: 35.0000 mg/m2 | ORAL_TABLET | Freq: Two times a day (BID) | ORAL | 0 refills | Status: AC

## 2019-07-01 MED ORDER — OXYCODONE HCL 5 MG PO TABS: 2.5000 mg | ORAL_TABLET | Freq: Four times a day (QID) | ORAL | 0 refills | Status: AC | PRN

## 2019-07-01 NOTE — Telephone Encounter (Signed)
Oral Oncology Patient Advocate Encounter  Received notification from [facility] that prior authorization for Beverly Schwartz is required.  PA submitted on CoverMyMeds Key [id number] Status is pending  Oral Oncology Clinic will continue to follow.  [name] Phone [phone] Fax [phone] 07/01/2019 10:25 AM

## 2019-07-01 NOTE — Progress Notes (Signed)
DISCONTINUE ON PATHWAY REGIMEN - Colorectal     A cycle is every 14 days:     Panitumumab      Irinotecan      Leucovorin      Fluorouracil      Fluorouracil   **Always confirm dose/schedule in your pharmacy ordering system**  REASON: Disease Progression PRIOR TREATMENT: COS76: FOLFIRI + Panitumumab q14 Days TREATMENT RESPONSE: Progressive Disease (PD)  START ON PATHWAY REGIMEN - Colorectal     A cycle is 28 days:     Trifluridine and tipiracil   **Always confirm dose/schedule in your pharmacy ordering system**  Patient Characteristics: Distant Metastases, Nonsurgical Candidate, KRAS/NRAS Wild-Type (BRAF V600 Wild-Type/Unknown), Standard Cytotoxic Therapy, Third Line Standard Cytotoxic Therapy, Prior Anti-EGFR Therapy Tumor Location: Colon Therapeutic Status: Distant Metastases Microsatellite/Mismatch Repair Status: MSS/pMMR BRAF Mutation Status: Wild-Type (no mutation) KRAS/NRAS Mutation Status: Wild-Type (no mutation) Preferred Therapy Approach: Standard Cytotoxic Therapy Standard Cytotoxic Line of Therapy: Third Building services engineer Cytotoxic Therapy Intent of Therapy: Non-Curative / Palliative Intent, Discussed with Patient

## 2019-07-01 NOTE — Telephone Encounter (Signed)
Oral Oncology Patient Advocate Encounter  Prior Authorization for Beverly Schwartz has been approved.    PA# [id number] Effective dates: 07/01/19 through 06/29/20  Patients co-pay is $0  Oral Oncology Clinic will continue to follow.   [place] Patient [place] Phone [phone] Fax [phone] 07/01/2019 11:49 AM

## 2019-07-01 NOTE — Telephone Encounter (Signed)
Oral Oncology Pharmacist Encounter  Received new prescription for Lonsurf (trifluridine-tipiracil) for the treatment of metastatic colon cancer in conjunction with bevacizumab, planned duration until disease progression or unacceptable drug toxicity.  CMP from 06/23/19 assessed, no relevant lab abnormalities. Prescription dose and frequency assessed.   Current medication list in Epic reviewed, no DDIs with Lonsurf identified.  Prescription has been e-scribed to the [facility] for benefits analysis and approval.  Oral Oncology Clinic will continue to follow for insurance authorization, copayment issues, initial counseling and start date.  Darl Pikes, PharmD, BCPS, BCOP, CPP Hematology/Oncology Clinical Pharmacist [facility] [place] [facility] [phone]  07/01/2019 4:15 PM

## 2019-07-01 NOTE — Progress Notes (Signed)
[place] Note  Made pastoral check-in phone call, leaving voicemail of encouragement and care. Plan to try again next week.   [place], [place], [facility] Pager [phone] Voicemail [phone]

## 2019-07-04 ENCOUNTER — Telehealth: Payer: Self-pay | Admitting: Hematology

## 2019-07-04 ENCOUNTER — Other Ambulatory Visit: Payer: Medicaid Other | Admitting: Internal Medicine

## 2019-07-04 DIAGNOSIS — Z515 Encounter for palliative care: Secondary | ICD-10-CM

## 2019-07-04 DIAGNOSIS — C19 Malignant neoplasm of rectosigmoid junction: Secondary | ICD-10-CM

## 2019-07-04 NOTE — Telephone Encounter (Signed)
Scheduled appt per 4/9 los.  Spoke with pt and she is aware of her next scheduled appt date and time.

## 2019-07-04 NOTE — Progress Notes (Signed)
Counseling intern called patient on 07/04/19 to check-in and offer emotional support. Pt did not answer and her voicemail box was full. CI will call pt again later this week.  Art Buff [place] Counseling Intern Voicemail:  [phone]

## 2019-07-05 ENCOUNTER — Other Ambulatory Visit: Payer: Self-pay

## 2019-07-05 NOTE — Progress Notes (Signed)
Pharmacist Chemotherapy Monitoring - Initial Assessment    Anticipated start date: 07/11/19    Regimen:  . Are orders appropriate based on the patient's diagnosis, regimen, and cycle? Yes . Does the plan date match the patient's scheduled date? Yes . Is the sequencing of drugs appropriate? Yes . Are the premedications appropriate for the patient's regimen? Yes . Prior Authorization for treatment is: Pending o If applicable, is the correct biosimilar selected based on the patient's insurance? yes  Organ Function and Labs: Marland Kitchen Are dose adjustments needed based on the patient's renal function, hepatic function, or hematologic function? Yes . Are appropriate labs ordered prior to the start of patient's treatment? Yes . Other organ system assessment, if indicated: bevacizumab: baseline BP . The following baseline labs, if indicated, have been ordered: bevacizumab: urine protein  Dose Assessment: . Are the drug doses appropriate? Yes . Are the following correct: o Drug concentrations Yes o IV fluid compatible with drug Yes o Administration routes Yes o Timing of therapy Yes . If applicable, does the patient have documented access for treatment and/or plans for port-a-cath placement? yes . If applicable, have lifetime cumulative doses been properly documented and assessed? not applicable Lifetime Dose Tracking  . Oxaliplatin: 800.225 mg/m2 (1,475 mg) = 133.37 % of the maximum lifetime dose of 600 mg/m2  o   Toxicity Monitoring/Prevention: . The patient has the following take home antiemetics prescribed: N/A . The patient has the following take home medications prescribed: N/A . Medication allergies and previous infusion related reactions, if applicable, have been reviewed and addressed. Yes . The patient's current medication list has been assessed for drug-drug interactions with their chemotherapy regimen. no significant drug-drug interactions were identified on review.  Order  Review: . Are the treatment plan orders signed? Yes . Is the patient scheduled to see a provider prior to their treatment? Yes  I verify that I have reviewed each item in the above checklist and answered each question accordingly.  Beverly Schwartz D 07/05/2019 3:35 PM

## 2019-07-07 NOTE — Progress Notes (Signed)
[place]   Telephone:([phone]) [phone] Fax:(336) [phone]   Clinic Follow up Note   Patient Care Team: Esaw Grandchild, NP as PCP - General (Family Medicine) Delrae Rend, MD as Consulting Physician (Endocrinology) Truitt Merle, MD as Consulting Physician (Hematology) Alla Feeling, NP as Nurse Practitioner (Nurse Practitioner) Clarene Essex, MD as Consulting Physician (Gastroenterology)  Date of Service:  07/11/2019  CHIEF COMPLAINT: F/u metastatic colon cancer  SUMMARY OF ONCOLOGIC HISTORY: Oncology History Overview Note  Cancer Staging Malignant neoplasm of rectosigmoid junction [facility]) Staging form: Colon and Rectum, AJCC 8th Edition - Clinical stage from 06/11/2017: Stage IVA (cTX, cN1b, pM1a) - Signed by Truitt Merle, MD on 06/15/2017     Malignant neoplasm of rectosigmoid junction ([place])  05/30/2017 Imaging   CT AP IMPRESSION: 1. 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 Abdominopelvic adenopathy. Rectosigmoid mass with suggestion of partial obstruction as evidenced by large colonic stool burden more proximally. 2.  Aortic Atherosclerosis (ICD10-I70.0).  This is age advanced.   05/31/2017 Tumor Marker   CEA 353.3 (elevated) AFP 4.5 (normal)   05/31/2017 Initial Biopsy   Diagnosis Colon, biopsy, sigmoid - INVASIVE ADENOCARCINOMA - SEE COMMENT   05/31/2017 Imaging   CT CHEST IMPRESSION: 1. No evidence of metastatic disease in the chest. 2. No acute findings.  Aortic Atherosclerosis (ICD10-I70.0).    05/31/2017 Procedure   Colonoscopy per Dr. Watt Climes Findings: An infiltrative and ulcerated partially obstructing medium-sized mass was found in the recto-sigmoid colon. The mass was circumferential. The mass measured four cm in length. No bleeding was present.   Impression - One small polyp in the rectum. - The examination was otherwise normal. - Malignant partially obstructing tumor in the  recto-sigmoid colon. Biopsied. - One medium polyp in the proximal sigmoid colon. - The examination was otherwise normal. - Internal hemorrhoids.   06/03/2017 Initial Diagnosis   Malignant neoplasm of transverse colon ([place])   06/11/2017 Cancer Staging   Staging form: Colon and Rectum, AJCC 8th Edition - Clinical stage from 06/11/2017: Stage IVA (cTX, cN1b, pM1a) - Signed by Truitt Merle, MD on 06/15/2017   06/11/2017 Pathology Results   Liver biopsy confirmed metastatic colon cancer   07/23/2017 Imaging   CT CAP IMPRESSION: 1. Mild progression of hepatic metastasis. 2. Similar rectosigmoid primary with abdominopelvic nodal metastasis. 3.  No acute process or evidence of metastatic disease in the chest. 4. Aortic Atherosclerosis (ICD10-I70.0). Coronary artery atherosclerosis. This is age advanced. 5. Proximal colonic constipation, again suggesting a component of partial obstruction at the primary site. 6. Mild ascending aortic dilatation at 4.1 cm.   08/25/2017 - 05/10/2018 Chemotherapy   -Xeloda '1500mg'$  BID 1 week on and 1 week off starting 08/25/17. Due to her worsening hand-foot syndrome her dose was reduced to '1500mg'$  in the AM and '1000mg'$  in the PM. Due to disease progression we swithced her to CAPOX  -Vectibix every 2 weeks starting 08/25/17. Due to skin rash will stop starting 05/10/18.     12/14/2017 Imaging   IMPRESSION: 1. Response to therapy. Marked decrease in hepatic metastatic burden. More mild decrease in abdominopelvic adenopathy and definition of rectosigmoid primary. 2.  No acute process or evidence of metastatic disease in the chest. 3.  Aortic Atherosclerosis (ICD10-I70.0).    04/28/2018 Imaging   CT CAP 04/28/18  IMPRESSION: 1. Index liver metastases measured on the previous study show no substantial interval change although new liver lesions on today's exam are concerning for progressive disease. 2. Mild lymphadenopathy  in the upper abdomen is stable. 3. Persistent mild  ill-defined wall thickening in the distal sigmoid colon near the rectosigmoid junction. 4.  Aortic Atherosclerois (ICD10-170.0)    05/17/2018 - 12/02/2018 Chemotherapy   CAPOX every 3 weeks with Xeloda '1500mg'$  BID 2 weeks on/1week off starting 05/17/18 -Add Avastin to first cycle.    09/09/2018 Imaging   CT CAP W Contrast IMPRESSION: 1. Slight interval decrease in size one of the right hepatic lobe lesions. Additional lesions within the liver are similar when compared to recent prior exam. 2. Mild adenopathy within the abdomen is stable. 3. Similar-appearing thick walled distal sigmoid colon.   12/20/2018 Imaging   CT CAP W Contrast  IMPRESSION: 1. Increase in size and number of hepatic metastatic lesions compared to the prior study. 2. Stable adenopathy in the abdomen. 3. Signs of sigmoid wall thickening as before.   01/04/2019 - 01/28/2019 Chemotherapy   Irinotecan and Avastin q2weeks starting 01/04/19.  Stoppes due to disease progression.    03/08/2019 Imaging   CT CAP W Contrast  IMPRESSION: 1. Interval progression of liver metastasis. 2. Stable borderline enlarged upper abdominal lymph node. 3. Aortic atherosclerosis.   Aortic Atherosclerosis (ICD10-I70.0).   04/04/2019 - 04/2019 Chemotherapy   FOLFIRI and Vectibix q2weeks starting 04/04/19. Irinotecan and Vectibix q2weeks starting 04/04/19. 50% 5FU with C2 only. Added Xeloda '1000mg'$  BID 1 week on/1 week off starting with C4. Last treatment was cycle 4 in 04/2019.    06/30/2019 Imaging   CT CAP W contrast  IMPRESSION: 1. Moderate to marked progression of hepatic and abdominal nodal metastasis since 03/27/19. Developing minimal left intrahepatic duct dilatation, presumably by mass effect of central hepatic metastasis. 2. Since the most recent chest CT of 03/08/2019, development of tiny bilateral pulmonary nodules which are technically indeterminate but favored to represent early metastasis. 3. New small right and trace left  pleural effusions. 4. Developing retrocrural adenopathy indicative of nodal metastasis. 5. Edema along the gonadal veins and within the adnexa, greater left than right. Given new concurrent abdominopelvic small volume ascites, this is indeterminate. Possibly related to increased portal venous pressure or mass-effect upon the gonadal veins, given progressive abdominal retroperitoneal adenopathy. 6. Possible constipation. Persistent architectural distortion and underdistention at the rectosigmoid junction, possibly site of prior primary. No well-defined mass or high-grade obstruction at this site.      CURRENT THERAPY:  Hospice and Supportive care  INTERVAL HISTORY:  Beverly Schwartz is here for a follow up. She presents to the clinic with her mother. She notes she is thinking of stopping treatment. She does not feel she is tolerating treatment enough and considering hospice for now. She does not feel strong enough or tolerating chemo. She notes she is more fatigue and weak lately. She tries to get out of bed soon. She notes she is still eating well currently. She notes she can drink more at home. For her right flank pain she has been taking ydrocodone but she mostly take Tylenol #3. She does feel more nauseous lately. She has oxycodone, but has not used much. She also notes sublingual Zofran has not worked.    REVIEW OF SYSTEMS:   Constitutional: Denies fevers, chills or abnormal weight loss (+) Fatigue  Eyes: Denies blurriness of vision Ears, nose, mouth, throat, and face: Denies mucositis or sore throat Respiratory: Denies cough, dyspnea or wheezes Cardiovascular: Denies palpitation, chest discomfort or lower extremity swelling Gastrointestinal:  Denies heartburn or change in bowel habits (+) Nausea  Skin: Denies abnormal skin  rashes Lymphatics: Denies new lymphadenopathy or easy bruising Neurological:Denies numbness, tingling or new weaknesses Behavioral/Psych: Mood is stable, no  new changes  All other systems were reviewed with the patient and are negative.  MEDICAL HISTORY:  Past Medical History:  Diagnosis Date  . Anxiety   . Cancer ([place])    colon, liver  . Chest pain 10/18/2018   Patient c/o chest pain right side yesterday intermitten lasting 3-4 hours.  [name], PA notified  . Colon cancer ([place])   . Complication of anesthesia   . Depression   . Diabetes mellitus   . Dizziness   . Family history of breast cancer   . Family history of stomach cancer   . Hyperlipidemia   . Hypertension   . Hypothyroidism   . Obesity   . PONV (postoperative nausea and vomiting)   . Tachycardia     SURGICAL HISTORY: Past Surgical History:  Procedure Laterality Date  . FLEXIBLE SIGMOIDOSCOPY N/A 05/31/2017   Procedure: FLEXIBLE SIGMOIDOSCOPY;  Surgeon: Clarene Essex, MD;  Location: [facility];  Service: Endoscopy;  Laterality: N/A;  . IR IMAGING GUIDED PORT INSERTION  10/19/2018  . WISDOM TOOTH EXTRACTION     age 44's    I have reviewed the social history and family history with the patient and they are unchanged from previous note.  ALLERGIES:  is allergic to bupropion and amoxicillin.  MEDICATIONS:  Current Outpatient Medications  Medication Sig Dispense Refill  . acetaminophen-codeine (TYLENOL #3) 300-30 MG tablet Take 1 tablet by mouth every 6 (six) hours as needed for moderate pain. 30 tablet 0  . albuterol (PROVENTIL HFA;VENTOLIN HFA) 108 (90 Base) MCG/ACT inhaler Inhale 2 puffs into the lungs every 6 (six) hours as needed for wheezing or shortness of breath. 1 Inhaler 0  . ALPRAZolam (XANAX) 1 MG tablet TAKE 1 TABLET BY MOUTH AT BEDTIME AS NEEDED FOR ANXIETY (Patient taking differently: Take 1 mg by mouth at bedtime as needed for anxiety. ) 30 tablet 0  . amLODipine (NORVASC) 10 MG tablet TAKE 1 TABLET BY MOUTH EVERY DAY 30 tablet 0  . benzonatate (TESSALON) 100 MG capsule Take 1 capsule (100 mg total) by mouth 3 (three) times daily as needed for cough.  30 capsule 0  . capecitabine (XELODA) 500 MG tablet Take 2 tablets (1,000 mg total) by mouth 2 (two) times daily after a meal. Take for 7 days, then hold for 7 days. Repeat every 14 days. 56 tablet 0  . carbamide peroxide (DEBROX) 6.5 % OTIC solution Place 5 drops into both ears 2 (two) times daily. 15 mL 0  . CINNAMON PO Take 1 tablet by mouth daily.     . clindamycin (CLINDAGEL) 1 % gel Apply topically 2 (two) times daily. 30 g 2  . fluticasone (FLONASE) 50 MCG/ACT nasal spray Place 1 spray into both nostrils daily. (Patient taking differently: Place 1 spray into both nostrils daily as needed for allergies. ) 16 g 2  . HYDROcodone-acetaminophen (NORCO) 5-325 MG tablet Take 1 tablet by mouth every 6 (six) hours as needed for moderate pain. 30 tablet 0  . insulin NPH-regular Human (NOVOLIN 70/30) (70-30) 100 UNIT/ML injection INJECT 10 UNITS WITH BREAKFAST, 10 UNITS WITH DINNER (Patient taking differently: Inject 10 Units into the skin 2 (two) times daily with a meal. ) 10 mL 2  . Insulin Syringe-Needle U-100 (BD INSULIN SYRINGE U/F) 31G X 5/16" 1 ML MISC Inject twice a day as directed 200 each 3  . levothyroxine (SYNTHROID)  112 MCG tablet TAKE 1 TABLET BY MOUTH DAILY BEFORE BREAKFAST. LABS REQUIRED PRIOR TO ANY FURTHER REFILLS. 30 tablet 0  . lidocaine (XYLOCAINE) 2 % solution     . lidocaine-prilocaine (EMLA) cream Apply 1 application topically as needed. 30 g 1  . lisinopril (ZESTRIL) 5 MG tablet TAKE 1 TABLET BY MOUTH EVERY DAY 30 tablet 0  . magnesium oxide (MAG-OX) 400 (241.3 Mg) MG tablet Take 1 tablet (400 mg total) by mouth daily. (Patient taking differently: Take 400 mg by mouth in the morning, at noon, and at bedtime. ) 90 tablet 1  . magnesium oxide (MAG-OX) 400 MG tablet Take 400 mg by mouth 2 (two) times daily.    . mometasone (ELOCON) 0.1 % cream Apply 1 application topically daily as needed.    . nitrofurantoin, macrocrystal-monohydrate, (MACROBID) 100 MG capsule Take 1 capsule (100  mg total) by mouth 2 (two) times daily. 14 capsule 0  . Omega-3 Fatty Acids (FISH OIL) 1000 MG CAPS Take 1,000 mg by mouth daily.     . ondansetron (ZOFRAN ODT) 8 MG disintegrating tablet Take 1 tablet (8 mg total) by mouth every 8 (eight) hours as needed for nausea or vomiting. 20 tablet 1  . oxyCODONE (OXY IR/ROXICODONE) 5 MG immediate release tablet Take 0.5-1 tablets (2.5-5 mg total) by mouth every 6 (six) hours as needed for severe pain. 20 tablet 0  . polyethylene glycol (MIRALAX / GLYCOLAX) packet Take 17 g by mouth daily. 14 each 0  . trifluridine-tipiracil (LONSURF) 20-8.19 MG tablet Take 3 tablets (60 mg of trifluridine total) by mouth 2 (two) times daily after a meal. 1 hr after AM & PM meals on days 1-5, 8-12. Repeat every 28day 60 tablet 0  . TURMERIC PO Take 1 capsule by mouth daily.      No current facility-administered medications for this visit.   Facility-Administered Medications Ordered in Other Visits  Medication Dose Route Frequency Provider Last Rate Last Admin  . 0.9 %  sodium chloride infusion   Intravenous Continuous Truitt Merle, MD   Stopped at 11/11/18 1330    PHYSICAL EXAMINATION: ECOG PERFORMANCE STATUS: 3 - Symptomatic, >50% confined to bed  Vitals:   07/11/19 1300  BP: 116/80  Pulse: (!) 125  Resp: 20  Temp: 98.2 F (36.8 C)  SpO2: 100%   Filed Weights   07/11/19 1300  Weight: 159 lb 8 oz (72.3 kg)    Due to COVID19 we will limit examination to appearance. Patient had no complaints.  GENERAL:alert, no distress and comfortable SKIN: skin color normal, no rashes or significant lesions EYES: normal, Conjunctiva are pink and non-injected, sclera clear  NEURO: alert & oriented x 3 with fluent speech   LABORATORY DATA:  I have reviewed the data as listed CBC Latest Ref Rng & Units 07/11/2019 06/23/2019 06/14/2019  WBC 4.0 - 10.5 K/uL 24.6(H) 25.6(H) 21.9(H)  Hemoglobin 12.0 - 15.0 g/dL 10.1(L) 9.9(L) 10.1(L)  Hematocrit 36.0 - 46.0 % 33.5(L) 32.2(L)  33.0(L)  Platelets 150 - 400 K/uL 562(H) 407(H) 316     CMP Latest Ref Rng & Units 07/11/2019 06/23/2019 06/14/2019  Glucose 70 - 99 mg/dL 235(H) 332(H) 248(H)  BUN 6 - 20 mg/dL '16 7 11  '$ Creatinine 0.44 - 1.00 mg/dL 0.91 0.78 0.81  Sodium 135 - 145 mmol/L 136 138 138  Potassium 3.5 - 5.1 mmol/L 4.1 4.5 3.7  Chloride 98 - 111 mmol/L 97(L) 97(L) 99  CO2 22 - 32 mmol/L '23 23 26  '$ Calcium  8.9 - 10.3 mg/dL 7.4(L) 7.4(L) 6.7(L)  Total Protein 6.5 - 8.1 g/dL 7.5 7.2 6.9  Total Bilirubin 0.3 - 1.2 mg/dL 1.7(H) 0.7 1.0  Alkaline Phos 38 - 126 U/L 1,162(H) 995(H) 652(H)  AST 15 - 41 U/L 139(H) 63(H) 53(H)  ALT 0 - 44 U/L 47(H) 24 26      RADIOGRAPHIC STUDIES: I have personally reviewed the radiological images as listed and agreed with the findings in the report. No results found.   ASSESSMENT & PLAN:  CHANCI OJALA is a 49 y.o. female with    1. Sigmoid adenocarcinoma, with abdominopelvic adenopathy andhepatic metastasis, stage IV, MSI-stable , KRAS/NRAS/BRAF wild type -Diagnosed in 05/2017. Patient declined intensive chemo regiments previously due to fear of side effects.She progressed on first lineXelodaandVectibixafter 8 months of therapy. -She had PAC placed on 10/19/18. -She has progressed onsecond lineCAPOX and Avastin which she tolerated poorly. She also progressed on third-line Irinotecan and Avastin.  -She tried fourth line with FOLFIRI and Vectibix but she did not tolerate her 1 dose of 50% 5FU pump or the addition of Xeloda. Her last chemo treatment was 05/19/19, multiple delays and on-hold due to not feeling well. Her CT CAP from 06/30/19 showed moderate to marked progression of liver and abdominal nodal metastasis which is leading to minimal left intrahepatic duct dilation. She also has development of tiny b/l lung nodules which are indeterminate. -I previously discussed next line treatment options in great detail, including oral treatments (Lonsurf or Stivarga). I  recommended IV avastin with Lonsurf for better efficacy. I reviewed major side effects side effects.  -she has developed more fatigue and abdominal discomfort lately. She is thinking about stopping all treatment and proceeding with Hospice for now as she does not feel strong enough for more treatment but manageable to take care of herself.  Due to her worsening performance status, I think is that is very reasonable.  . I reviewed the benefits and logistics of hospice service.  We also discussed the residential hospice if she is more somatic and needs more medical care. -She voiced good understanding and notes she would like to proceed with hospice. I will send hospice referral.  We discussed that there will be no routine lab work, IV fluids, blood transfusion, and any cancer treatment when she is under hospice service. -Labs reviewed, WBC 24.6, Hg 10.1, plt 562K, ANC 20.7, BG 235, ca 7.4, AST 139, ALT 47, Alk Phos 1162, Tbili 1.7. I discussed her Transaminitis is related to her recent liver mets progression. I discussed this and her tbili will continue to increase in the next few weeks.  -I will continue to manage her overall care and f/u with her as needed.    2.Goal of care discussion, DNR/DNI -She understands the goal of care is palliative, her cancer is incurable.The goal of therapy is to prolong her life and improve her quality of life. -I again recommended DNR/DNI, especially given she wants to proceed with Hospice. She is agreeable to DRN/DNI.  I previously discussed she can specify when she wants to stop life support because intubation is apart of resuscitation.    -She notes she has a living will set up.    3. Symptom Management: Right flank pain, Nausea, Fatigue  -Her CT CAP from 06/30/19 shows disease progression in her liver. I discussed her pain is likely from this.  -She notes nausea and not tolerating Tylenol #3 and Hydrocodone lately. She has not taken much of Oxycodone I encouraged  her to try again. She is agreeable.  -For Nausea sublingual Zofran has not helped so she can use pill again  -I advised her to watch for constipation with increased pain medication. I recommend Senakot, she is willing to try.  -She is currently eating enough, I encouraged her to drink more.    4. DM, HTN, HL, Neuropathy, insomnia, Anxiety/Depression  -She will continue medications for now.   5. Financial and Social issues -Physicist, medical regained insurancewithBlue cross insurancein 2020 -Shefollows up as needed withSocial Worker [place] referral, I will remain to be her MD when she is under hospice care -F/u as needed   No problem-specific Assessment & Plan notes found for this encounter.   No orders of the defined types were placed in this encounter.  All questions were answered. The patient knows to call the clinic with any problems, questions or concerns. No barriers to learning was detected. The total time spent in the appointment was 30 minutes.     Truitt Merle, MD 07/11/2019   I, Joslyn Devon, am acting as scribe for Truitt Merle, MD.   I have reviewed the above documentation for accuracy and completeness, and I agree with the above.

## 2019-07-11 ENCOUNTER — Inpatient Hospital Stay: Payer: BC Managed Care – PPO

## 2019-07-11 ENCOUNTER — Other Ambulatory Visit: Payer: Self-pay

## 2019-07-11 ENCOUNTER — Encounter: Payer: Self-pay | Admitting: Hematology

## 2019-07-11 ENCOUNTER — Telehealth: Payer: Self-pay

## 2019-07-11 ENCOUNTER — Inpatient Hospital Stay: Payer: BC Managed Care – PPO | Admitting: Hematology

## 2019-07-11 VITALS — BP 116/80 | HR 125 | Temp 98.2°F | Resp 20 | Ht 65.0 in | Wt 159.5 lb

## 2019-07-11 DIAGNOSIS — D500 Iron deficiency anemia secondary to blood loss (chronic): Secondary | ICD-10-CM

## 2019-07-11 DIAGNOSIS — C187 Malignant neoplasm of sigmoid colon: Secondary | ICD-10-CM

## 2019-07-11 DIAGNOSIS — C19 Malignant neoplasm of rectosigmoid junction: Secondary | ICD-10-CM

## 2019-07-11 DIAGNOSIS — I10 Essential (primary) hypertension: Secondary | ICD-10-CM | POA: Diagnosis not present

## 2019-07-11 LAB — CMP (CANCER CENTER ONLY)
ALT: 47 U/L — ABNORMAL HIGH (ref 0–44)
AST: 139 U/L — ABNORMAL HIGH (ref 15–41)
Albumin: 1.9 g/dL — ABNORMAL LOW (ref 3.5–5.0)
Alkaline Phosphatase: 1162 U/L — ABNORMAL HIGH (ref 38–126)
Anion gap: 16 — ABNORMAL HIGH (ref 5–15)
BUN: 16 mg/dL (ref 6–20)
CO2: 23 mmol/L (ref 22–32)
Calcium: 7.4 mg/dL — ABNORMAL LOW (ref 8.9–10.3)
Chloride: 97 mmol/L — ABNORMAL LOW (ref 98–111)
Creatinine: 0.91 mg/dL (ref 0.44–1.00)
GFR, Est AFR Am: >60 mL/min (ref >60)
GFR, Estimated: >60 mL/min (ref >60)
Glucose, Bld: 235 mg/dL — ABNORMAL HIGH (ref 70–99)
Potassium: 4.1 mmol/L (ref 3.5–5.1)
Sodium: 136 mmol/L (ref 135–145)
Total Bilirubin: 1.7 mg/dL — ABNORMAL HIGH (ref 0.3–1.2)
Total Protein: 7.5 g/dL (ref 6.5–8.1)

## 2019-07-11 LAB — CBC WITH DIFFERENTIAL (CANCER CENTER ONLY)
Abs Immature Granulocytes: 0.3 10*3/uL — ABNORMAL HIGH (ref 0.00–0.07)
Basophils Absolute: 0.1 10*3/uL (ref 0.0–0.1)
Basophils Relative: 0 %
Eosinophils Absolute: 0.1 10*3/uL (ref 0.0–0.5)
Eosinophils Relative: 0 %
HCT: 33.5 % — ABNORMAL LOW (ref 36.0–46.0)
Hemoglobin: 10.1 g/dL — ABNORMAL LOW (ref 12.0–15.0)
Immature Granulocytes: 1 %
Lymphocytes Relative: 8 %
Lymphs Abs: 2 10*3/uL (ref 0.7–4.0)
MCH: 25.3 pg — ABNORMAL LOW (ref 26.0–34.0)
MCHC: 30.1 g/dL (ref 30.0–36.0)
MCV: 84 fL (ref 80.0–100.0)
Monocytes Absolute: 1.5 10*3/uL — ABNORMAL HIGH (ref 0.1–1.0)
Monocytes Relative: 6 %
Neutro Abs: 20.7 10*3/uL — ABNORMAL HIGH (ref 1.7–7.7)
Neutrophils Relative %: 85 %
Platelet Count: 562 10*3/uL — ABNORMAL HIGH (ref 150–400)
RBC: 3.99 MIL/uL (ref 3.87–5.11)
RDW: 23.5 % — ABNORMAL HIGH (ref 11.5–15.5)
WBC Count: 24.6 10*3/uL — ABNORMAL HIGH (ref 4.0–10.5)
nRBC: 0 % (ref 0.0–0.2)

## 2019-07-11 LAB — CEA (IN HOUSE-CHCC): CEA (CHCC-In House): 2687.7 ng/mL — ABNORMAL HIGH (ref 0.00–5.00)

## 2019-07-11 LAB — TOTAL PROTEIN, URINE DIPSTICK: Protein, ur: 100 mg/dL — AB

## 2019-07-11 LAB — FERRITIN: Ferritin: 894 ng/mL — ABNORMAL HIGH (ref 11–307)

## 2019-07-11 LAB — IRON AND TIBC
Iron: 29 ug/dL — ABNORMAL LOW (ref 41–142)
Saturation Ratios: 13 % — ABNORMAL LOW (ref 21–57)
TIBC: 226 ug/dL — ABNORMAL LOW (ref 236–444)
UIBC: 197 ug/dL (ref 120–384)

## 2019-07-11 LAB — MAGNESIUM: Magnesium: 1.9 mg/dL (ref 1.7–2.4)

## 2019-07-11 MED FILL — LONSURF 20-8.19 MG TABS: 20-8.19 | 28 days supply | Qty: 60 | Fill #0

## 2019-07-11 NOTE — Telephone Encounter (Signed)
I spoke  With Delsa Sale from [employer] and had Ms Ballo transitioned from palliative care to hospice services.

## 2019-07-12 ENCOUNTER — Encounter: Payer: Self-pay | Admitting: General Practice

## 2019-07-12 NOTE — Progress Notes (Signed)
[place] Note  Reached Beverly Schwartz by phone to check in about how she is feeling about and coping with hospice admission. Being freed from the debilitating exhaustion from treatment is bringing her relief and peace. She hopes to spend much of [place]. One top concern is talking with her son Camillia Herter; Maudie Mercury states that she is hopeful that someone close to her who is "better at talking about things" might talk with him first; Maudie Mercury is worried that she would cry and have a hard time with such a big conversation. Normalized tears and big feelings related to end of life in general and talking vulnerably and authentically with loved ones in particular. Beverly Schwartz got interrupted by her brother's arrival, but welcomes pastoral phone calls, so we plan to speak again in the next few days.   [place], [place], [facility] Pager [phone] Voicemail [phone]

## 2019-07-13 ENCOUNTER — Other Ambulatory Visit: Payer: Self-pay | Admitting: Hematology

## 2019-07-13 DIAGNOSIS — C19 Malignant neoplasm of rectosigmoid junction: Secondary | ICD-10-CM

## 2019-07-13 DIAGNOSIS — I10 Essential (primary) hypertension: Secondary | ICD-10-CM

## 2019-07-18 ENCOUNTER — Encounter: Payer: Self-pay | Admitting: Physician Assistant

## 2019-07-18 NOTE — Progress Notes (Signed)
Counseling intern spoke to patient on 07/18/2019 after pt returned call for a telehealth counseling session via phone. CI provided space for pt to open up about experiences and responded with empathy, compassion, and normalization of feelings.   Pt reported she has been extremely weak and has experienced a lot of pain. For example pt reported she is completely exhausted by walking to her kitchen and can't sit up for long periods of time. Despite these challenges, she is hoping to plan a trip to her family's cabin in the [place] on Wednesday and looks forward to enjoying the mountain views, cool air, and spending time with her brother. Pt reflected on relationships with her family members and how her cancer dx has changed their relationships in some ways. She is grateful for their expressions of love and shares in their sadness about her terminal dx. For example, she menioned a tearful meeting with her brother and appreciates her Dad's visits on his tractor and recognizes his effort while he deals with his own chronic pain. However, she noted that some patterns of interacting are harder to change, but that she tries to be patient but this takes energy that she no longer has. CI normalized that emotional reactivity often increases with fatigue, but encouraged her to avoid being self-critical. Pt stated she feels "relieved" not to have to do any more chemo even thought it is "scary" to think about the cancer spreading. Pt said her doctors gave her a tearful good bye at her last visit, which she felt was sweet. Pt and CI discussed how hard it is not knowing when the end of life will be, but that she attempts to keep living despite her difficulties. Pt stated that her son was not upset that she stopped chemo and she feels that he understands that her cancer is terminal, but they have not directly discussed her death because it feels too hard and too sad. Pt reflected that she is still able to enjoy some things, like  the gift of homemade baked goods from a friend. Pt also hopes to have a small outdoor celebration for her son's birthday in a few weeks.   CI provided space for pt to process emotions and provided emotional support.  CI encouraged the pt to journal as a way of processing her experiences and her emotions. CI reminded the pt that she will not be the intern at [facility] after 07/22/19 and pt voiced understanding. Pt stated that the nurses she interacts with at Hospice are very sweet and feels supported by them. She also continues to value and welcome calls from Lorrin Jackson for pastoral care.  Art Buff [place] Counseling Intern Voicemail:  [phone]

## 2019-07-18 NOTE — Progress Notes (Signed)
Counseling intern called patient on 07/18/19 to offer emotional support. Pt did not answer and CI left voicemail to encourage pt to call if she is interested in talking. CI also informed pt that this is her last week as the counseling intern at [facility] and that she will try to reach the pt later in the week if we have not spoken yet at that point.   Art Buff [place] Counseling Intern Voicemail:  [phone]

## 2019-07-19 ENCOUNTER — Encounter: Payer: Self-pay | Admitting: General Practice

## 2019-07-19 ENCOUNTER — Ambulatory Visit: Payer: BC Managed Care – PPO | Admitting: Physician Assistant

## 2019-07-19 NOTE — Progress Notes (Signed)
[place] Spiritual Care Note  LVM of encouragement and will continue to try catch Kim periodically.   [place], [place], [facility] Pager [phone] Voicemail [phone]

## 2019-07-22 ENCOUNTER — Telehealth: Payer: Self-pay | Admitting: *Deleted

## 2019-07-22 ENCOUNTER — Other Ambulatory Visit: Payer: Self-pay | Admitting: Hematology

## 2019-07-22 ENCOUNTER — Encounter: Payer: Self-pay | Admitting: General Practice

## 2019-07-22 MED ORDER — FENTANYL 12 MCG/HR TD PT72: 1.0000 | MEDICATED_PATCH | TRANSDERMAL | 0 refills | Status: AC

## 2019-07-22 NOTE — Progress Notes (Signed)
[facility] Spiritual Care Note  Left voicemail of care, encouragement, and blessing.   [place], [place], [facility] Pager [phone] Voicemail [phone]

## 2019-07-22 NOTE — Telephone Encounter (Signed)
OK, I will call in Fentanyl patch 12.5cmg for her. Thanks   Truitt Merle MD

## 2019-07-22 NOTE — Telephone Encounter (Signed)
Seth Bake from [employer] called to request Fentanyl patch for pain control. Patient is reporting a lot of pain, but does not like taking oxycodone as it causes nausea.

## 2019-07-22 NOTE — Telephone Encounter (Signed)
Seth Bake RN notified of message below

## 2019-07-23 ENCOUNTER — Other Ambulatory Visit: Payer: Self-pay | Admitting: Family Medicine

## 2019-07-24 NOTE — Progress Notes (Signed)
    [facility] Consult Note Telephone: [phone]  Fax: [phone]  PATIENT NAME: Beverly Schwartz DOB: 02/02/1971 MRN: [id number]  PRIMARY CARE PROVIDER:   Mina Marble  REFERRING PROVIDER:  Dr. Burr Medico   RESPONSIBLE PARTY:   Self and mother/HCPOA  NOTE:  Mother phoned this NP and stated that she and her daughter would have to cancel today's appointment because she was not feeling well and "is not up to the visit".She will be receiving the next phase of treatment.  I asked her to call back to reschedule today's appointment in the near further. Her questions were answered and support given.  Gonzella Lex, NP-C    Gonzella Lex, NP

## 2019-07-24 NOTE — Progress Notes (Signed)
[facility] Consult Note Telephone: [phone]  Fax: [phone]  PATIENT NAME: Beverly Schwartz DOB: [birth year] MRN: [id number]  PRIMARY CARE PROVIDER:   No primary care provider on file.  REFERRING PROVIDER:  Esaw Grandchild, NP [place],  [place] [id number]  RESPONSIBLE PARTY:   Self.  Mother is POA    RECOMMENDATIONS and PLAN:  Palliative care encounter  Z51.5  1. Advance care planning:  Explained palliative and hospice care. Pt's goal is to continue palliative chemotherapy with hopes of improving current symptoms of decreased appetite and fatigue. She would also like to spend time with her young son. Contact number for [facility] given to pt's mother for support of son.   Advance directives reviewed.  Advanced directives reviewed and pt stated that she needs to think about selections.  Palliative care will follow-up with patient  And re-address advanced directives.    2.  Fatigue:  Related to poor nutrition, weight loss and advanced metastatic disease. Attempt frequent comfort feedings and hydration.  Rest as needed.  Monitor if symptoms are worsening with treatments.  .       3.  Cancer related pain: Not well controlled with use of Hydrocodone 5/325mg .  Consider increase dose to 10/325mg .  Monitor for improvement  Consider transition to hospice care if multiple symptoms continue to progress.  4.  Depression:  Continue supportive sessions with Chaplain. Schedule support session for son whom she is very concerned about.  Allowed for pt to have open ended reflectioniscussions.  Expected low mood with advanced disease process. Support given.  I spent 60 minutes coordinating care from 0900 to 1000 in communication with patient and her motherl.  Medical records reviewed.   HISTORY OF PRESENT ILLNESS:  Beverly Schwartz is a 49 y.o. 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  Mother reports that pt sleeps numerous hours per day and has a very poor appetite with associated nausea and abdomen pain.She is able to perform simple ADLs.   Palliative Care was asked to help address goals of care.   CODE STATUS: FULL CODE  PPS: 40% weak HOSPICE ELIGIBILITY/DIAGNOSIS: YES/ Metastatic colorectal cancer but is receiving treatment currently  PAST MEDICAL HISTORY:  Past Medical History:  Diagnosis Date  . Anxiety   . Cancer ([facility])    colon, liver  . Chest pain 10/18/2018   Patient c/o chest pain right side yesterday intermitten lasting 3-4 hours.  [name], PA notified  . Colon cancer ([facility])   . Complication of anesthesia   . Depression   . Diabetes mellitus   . Dizziness   . Family history of breast cancer   . Family history of stomach cancer   . Hyperlipidemia   . Hypertension   . Hypothyroidism   . Obesity   . PONV (postoperative nausea and vomiting)   . Tachycardia     ALLERGIES:  Allergies  Allergen Reactions  . Bupropion Other (See Comments)    suicidal ideations  . Amoxicillin Rash    Has patient had a PCN reaction causing immediate rash, facial/tongue/throat swelling, SOB or lightheadedness with hypotension: Yes Has patient had a PCN reaction causing severe rash involving mucus membranes or skin necrosis: No Has patient had a PCN reaction that required hospitalization: No Has patient had a PCN reaction occurring within the last 10 years: No If all of the above answers are "NO",  then may proceed with Cephalosporin use.       PHYSICAL EXAM:   General: NAD,gravely ill and frail appearing, thin Cardiovascular: regular rate and rhythm Pulmonary[place] RLL Abdomen: soft, tender, hypoactive bowel sounds GU:R flank tenderness Extremities: 1+ edema,  Skin:pale in color.  Not icteric Neurological: Weakness but otherwise nonfocal Psych:  Mood fluctuates from flat to distractive  Gonzella Lex, NP-c

## 2019-07-25 ENCOUNTER — Other Ambulatory Visit: Payer: BC Managed Care – PPO

## 2019-07-25 ENCOUNTER — Ambulatory Visit: Payer: BC Managed Care – PPO | Admitting: Nurse Practitioner

## 2019-07-25 ENCOUNTER — Ambulatory Visit: Payer: BC Managed Care – PPO

## 2019-08-01 ENCOUNTER — Telehealth: Payer: Self-pay

## 2019-08-01 NOTE — Telephone Encounter (Signed)
Recieved fax notification of Beverly Schwartz death on 08/01/2019.

## 2019-08-03 ENCOUNTER — Other Ambulatory Visit: Payer: Self-pay | Admitting: Physician Assistant

## 2019-08-23 DEATH — deceased

## 2020-05-21 IMAGING — CT CT HEAD WITHOUT CONTRAST
3 series · 14 of 47 positions shown, 16 images · non-contrast
Comparison: None

CLINICAL DATA: Acute headache, normal neurologic exam, history
colon cancer, diabetes mellitus, hypertension

EXAM:
CT HEAD WITHOUT CONTRAST
TECHNIQUE: Contiguous axial images were obtained from the base of the skull
through the vertex without intravenous contrast.

[Series 3: head 5.0 h30s · axial · 0.43mm/px · z∈[-129,+6]mm · 8 of 33 slices shown, 10 images]
[im 3/33  brain]
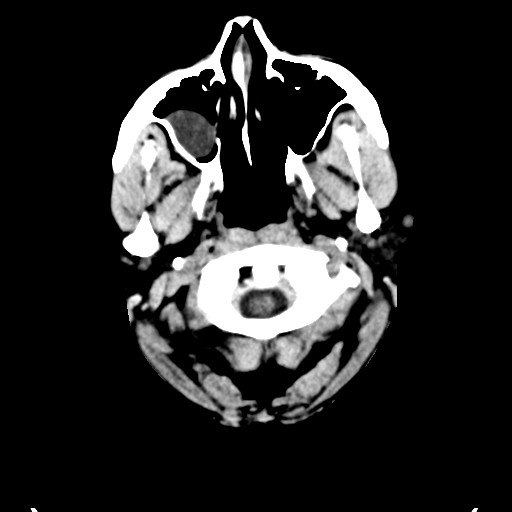
[im 3/33  bone]
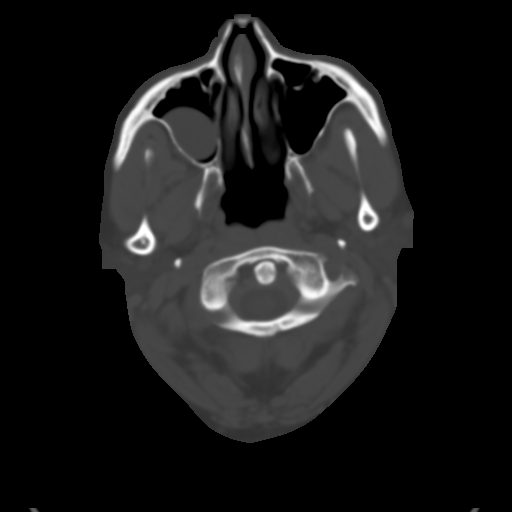
[im 7/33  brain]
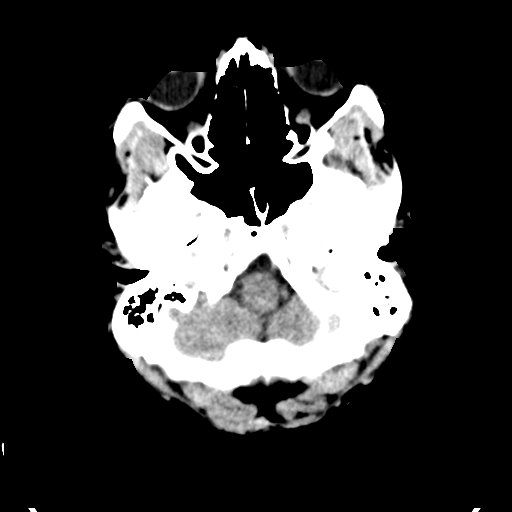
[im 10/33  brain]
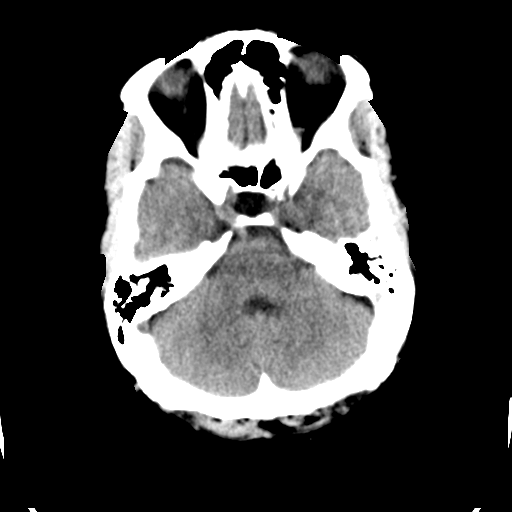
[im 15/33  brain]
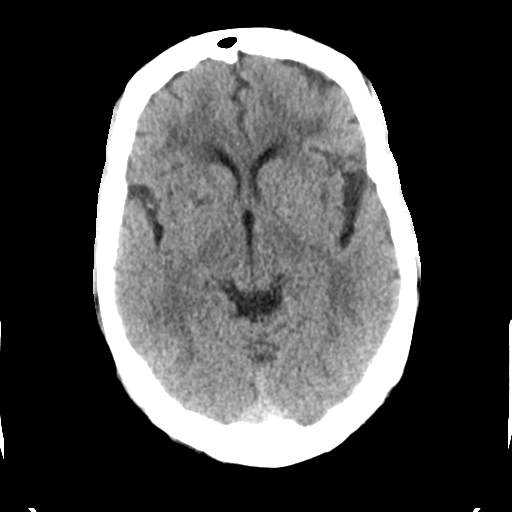
[im 18/33  brain]
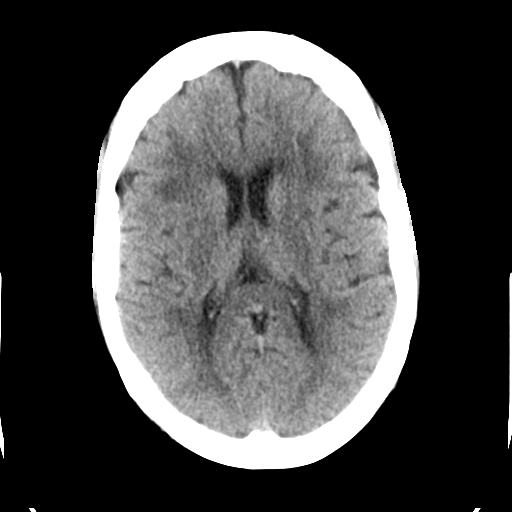
[im 18/33  bone]
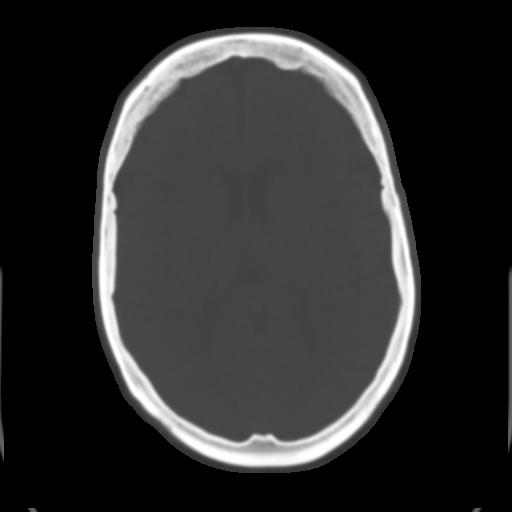
[im 23/33  brain]
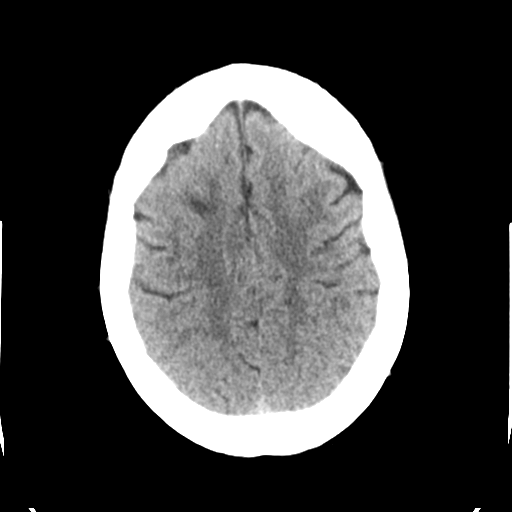
[im 26/33  brain]
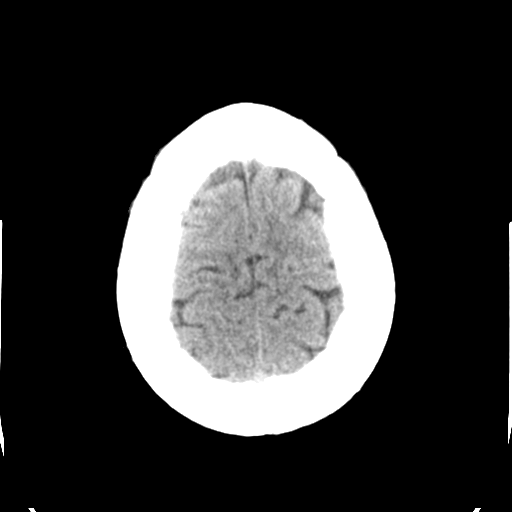
[im 30/33  brain]
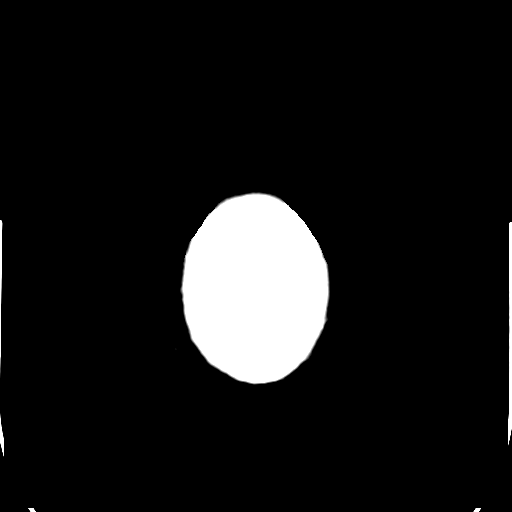

[Series 5: head 3.0 mpr cor · coronal · 0.32mm/px · 3 of 78 slices shown]
[im 26/78  brain]
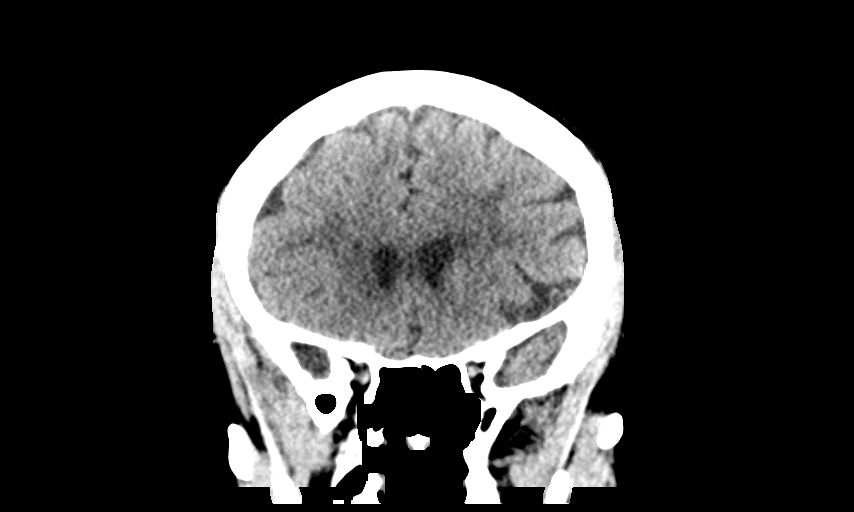
[im 35/78  brain]
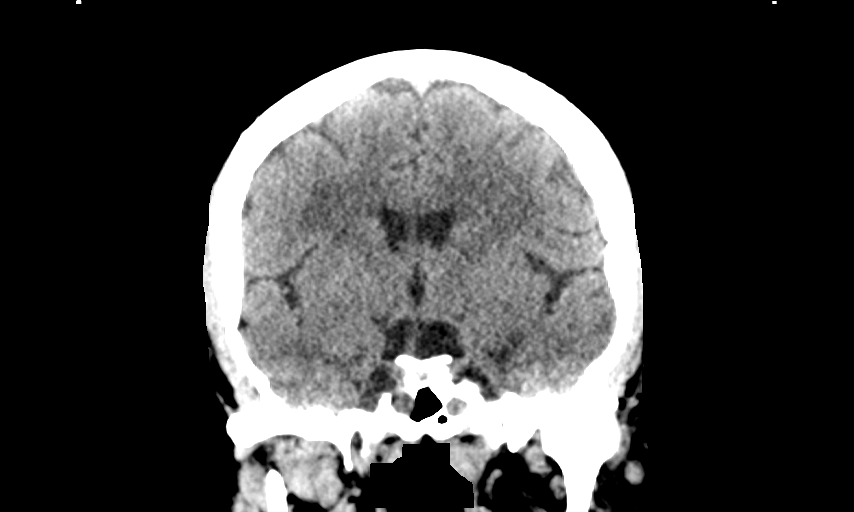
[im 43/78  brain]
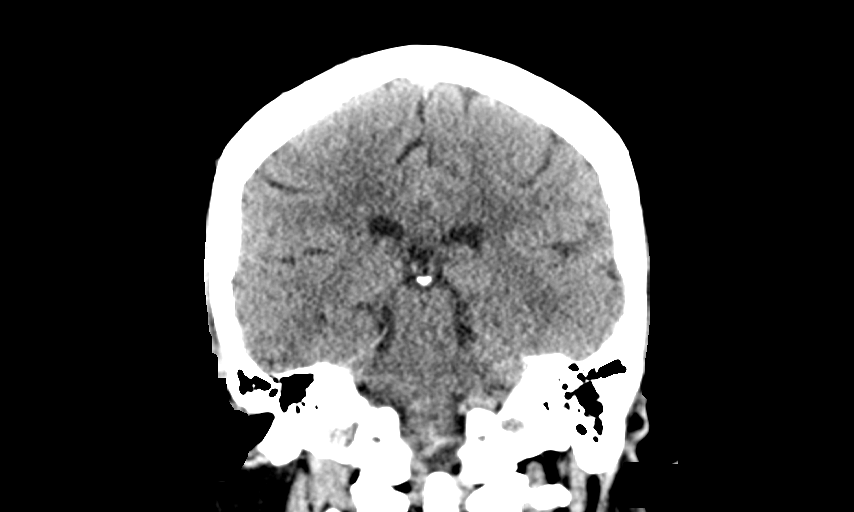

[Series 6: head 3.0 mpr sag · sagittal · 0.32mm/px · 3 of 67 slices shown]
[im 23/67  brain]
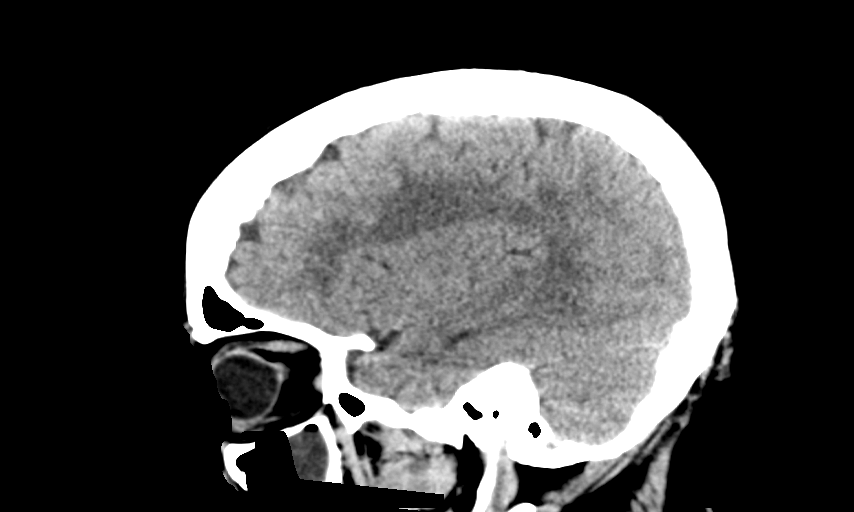
[im 34/67  brain]
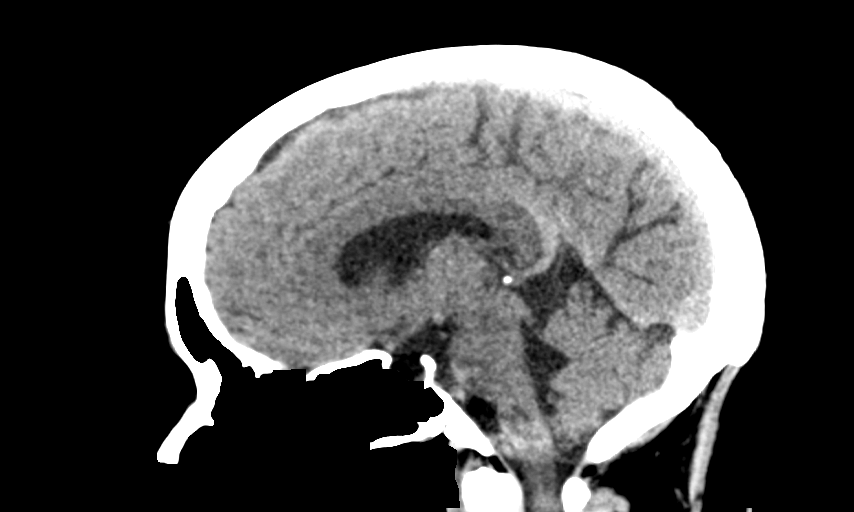
[im 45/67  brain]
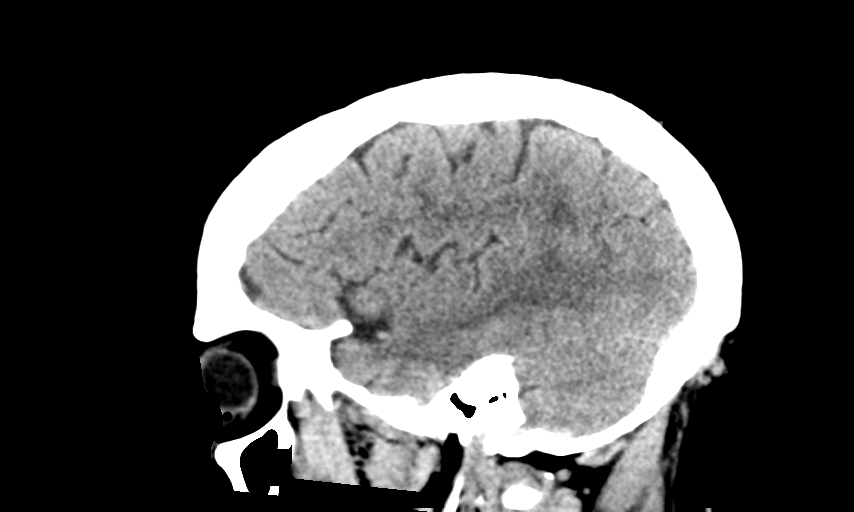

[14 of 47 positions shown; findings below may reference images not displayed]

FINDINGS: Brain: Minimal atrophy. Normal ventricular morphology. No midline
shift or mass effect. Small vessel chronic ischemic changes of deep
cerebral white matter. No intracranial hemorrhage, mass lesion,
evidence of acute infarction, or extra-axial fluid collection.

Vascular: Unremarkable

Skull: Intact

Sinuses/Orbits: Mucosal retention cyst RIGHT maxillary sinus

Other: N/A
IMPRESSION: Small vessel chronic ischemic changes of deep cerebral white matter.

No acute intracranial abnormalities.

## 2020-08-15 IMAGING — US IR LEFT FLUORO GUIDE CV LINE
1 series · 1 of 1 positions shown · non-contrast
Comparison: none

CLINICAL DATA: Metastatic colon cancer

[Series 1: ir fluoro/shunt/fist · 1 of 1 slices shown]
[im 1/1]
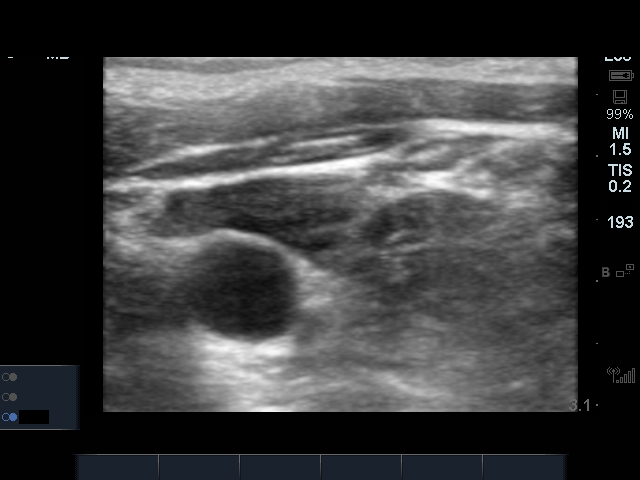

[1 of 1 positions shown; findings below may reference images not displayed]

EXAM:
RIGHT INTERNAL JUGULAR SINGLE LUMEN POWER PORT CATHETER INSERTION

Radiologist:  Eloi-Moraru, Costovici

Guidance:  Ultrasound fluoroscopic

MEDICATIONS:
Clindamycin 900 mg; The antibiotic was administered within an
appropriate time interval prior to skin puncture.

ANESTHESIA/SEDATION:
Versed 3.0 mg IV; Fentanyl 100 mcg IV; 4 mg Zofran

Moderate Sedation Time:  27 minutes

The patient was continuously monitored during the procedure by the
interventional radiology nurse under my direct supervision.

FLUOROSCOPY TIME:  0 minutes, 54 seconds (9 mGy)

COMPLICATIONS:
None immediate.

CONTRAST:  None.

PROCEDURE:
Informed consent was obtained from the patient following explanation
of the procedure, risks, benefits and alternatives. The patient
understands, agrees and consents for the procedure. All questions
were addressed. A time out was performed.

Maximal barrier sterile technique utilized including caps, mask,
sterile gowns, sterile gloves, large sterile drape, hand hygiene,
and 2% chlorhexidine scrub.

Under sterile conditions and local anesthesia, right internal
jugular micropuncture venous access was performed. Access was
performed with ultrasound. Images were obtained for documentation of
the patent right internal jugular vein. A guide wire was inserted
followed by a transitional dilator. This allowed insertion of a
guide wire and catheter into the IVC. Measurements were obtained
from the SVC / RA junction back to the right IJ venotomy site. In
the right infraclavicular chest, a subcutaneous pocket was created
over the second anterior rib. This was done under sterile conditions
and local anesthesia. 1% lidocaine with epinephrine was utilized for
this. A 2.5 cm incision was made in the skin. Blunt dissection was
performed to create a subcutaneous pocket over the right pectoralis
major muscle. The pocket was flushed with saline vigorously. There
was adequate hemostasis. The port catheter was assembled and checked
for leakage. The port catheter was secured in the pocket with two
retention sutures. The tubing was tunneled subcutaneously to the
right venotomy site and inserted into the SVC/RA junction through a
valved peel-away sheath. Position was confirmed with fluoroscopy.
Images were obtained for documentation. The patient tolerated the
procedure well. No immediate complications. Incisions were closed in
a two layer fashion with 4 - 0 Vicryl suture. Dermabond was applied
to the skin. The port catheter was accessed, blood was aspirated
followed by saline and heparin flushes. Needle was removed. A dry
sterile dressing was applied.
IMPRESSION: Ultrasound and fluoroscopically guided right internal jugular single
lumen power port catheter insertion. Tip in the SVC/RA junction.
Catheter ready for use.
